# Patient Record
Sex: Female | Born: 1935
Health system: Southern US, Community
[De-identification: ages and names within clinical notes are randomized; demographics above are authoritative.]

## PROBLEM LIST (undated history)

## (undated) DIAGNOSIS — I509 Heart failure, unspecified: Secondary | ICD-10-CM

## (undated) DIAGNOSIS — K449 Diaphragmatic hernia without obstruction or gangrene: Secondary | ICD-10-CM

## (undated) DIAGNOSIS — F329 Major depressive disorder, single episode, unspecified: Secondary | ICD-10-CM

## (undated) DIAGNOSIS — I2510 Atherosclerotic heart disease of native coronary artery without angina pectoris: Secondary | ICD-10-CM

## (undated) DIAGNOSIS — F32A Depression, unspecified: Secondary | ICD-10-CM

## (undated) DIAGNOSIS — K219 Gastro-esophageal reflux disease without esophagitis: Secondary | ICD-10-CM

## (undated) DIAGNOSIS — N809 Endometriosis, unspecified: Secondary | ICD-10-CM

## (undated) DIAGNOSIS — M545 Low back pain, unspecified: Secondary | ICD-10-CM

## (undated) DIAGNOSIS — Z8781 Personal history of (healed) traumatic fracture: Secondary | ICD-10-CM

## (undated) DIAGNOSIS — E785 Hyperlipidemia, unspecified: Secondary | ICD-10-CM

## (undated) DIAGNOSIS — R351 Nocturia: Secondary | ICD-10-CM

## (undated) DIAGNOSIS — Z8679 Personal history of other diseases of the circulatory system: Secondary | ICD-10-CM

## (undated) DIAGNOSIS — K5790 Diverticulosis of intestine, part unspecified, without perforation or abscess without bleeding: Secondary | ICD-10-CM

## (undated) DIAGNOSIS — J90 Pleural effusion, not elsewhere classified: Secondary | ICD-10-CM

## (undated) DIAGNOSIS — I639 Cerebral infarction, unspecified: Secondary | ICD-10-CM

## (undated) DIAGNOSIS — T7840XA Allergy, unspecified, initial encounter: Secondary | ICD-10-CM

## (undated) DIAGNOSIS — N3946 Mixed incontinence: Principal | ICD-10-CM

## (undated) DIAGNOSIS — I739 Peripheral vascular disease, unspecified: Secondary | ICD-10-CM

## (undated) DIAGNOSIS — G4730 Sleep apnea, unspecified: Secondary | ICD-10-CM

## (undated) DIAGNOSIS — B159 Hepatitis A without hepatic coma: Secondary | ICD-10-CM

## (undated) DIAGNOSIS — I10 Essential (primary) hypertension: Secondary | ICD-10-CM

## (undated) HISTORY — DX: Low back pain, unspecified: M54.50

## (undated) HISTORY — DX: Heart failure, unspecified: I50.9

## (undated) HISTORY — DX: Sleep apnea, unspecified: G47.30

## (undated) HISTORY — DX: Hepatitis a without hepatic coma: B15.9

## (undated) HISTORY — DX: Peripheral vascular disease, unspecified: I73.9

## (undated) HISTORY — DX: Pleural effusion, not elsewhere classified: J90

## (undated) HISTORY — DX: Gastro-esophageal reflux disease without esophagitis: K21.9

## (undated) HISTORY — DX: Personal history of (healed) traumatic fracture: Z87.81

## (undated) HISTORY — DX: Diaphragmatic hernia without obstruction or gangrene: K44.9

## (undated) HISTORY — DX: Endometriosis, unspecified: N80.9

## (undated) HISTORY — DX: Depression, unspecified: F32.A

## (undated) HISTORY — DX: Major depressive disorder, single episode, unspecified: F32.9

## (undated) HISTORY — DX: Mixed incontinence: N39.46

## (undated) HISTORY — DX: Atherosclerotic heart disease of native coronary artery without angina pectoris: I25.10

## (undated) HISTORY — DX: Essential (primary) hypertension: I10

## (undated) HISTORY — DX: Hyperlipidemia, unspecified: E78.5

## (undated) HISTORY — DX: Nocturia: R35.1

## (undated) HISTORY — PX: APPENDECTOMY: SHX54

## (undated) HISTORY — DX: Cerebral infarction, unspecified: I63.9

## (undated) HISTORY — PX: CORONARY ARTERY BYPASS GRAFT: SHX141

## (undated) HISTORY — DX: Personal history of other diseases of the circulatory system: Z86.79

## (undated) HISTORY — DX: Low back pain: M54.5

## (undated) HISTORY — DX: Diverticulosis of intestine, part unspecified, without perforation or abscess without bleeding: K57.90

## (undated) HISTORY — PX: CAROTID ENDARTERECTOMY: SUR193

## (undated) HISTORY — PX: KNEE SURGERY: SHX244

## (undated) HISTORY — PX: PARTIAL COLECTOMY: SHX5273

## (undated) HISTORY — DX: Allergy, unspecified, initial encounter: T78.40XA

---

## 1997-09-17 ENCOUNTER — Ambulatory Visit (HOSPITAL_COMMUNITY): Admission: RE | Admit: 1997-09-17 | Discharge: 1997-09-17 | Payer: Self-pay | Admitting: Cardiovascular Disease

## 1998-06-30 ENCOUNTER — Other Ambulatory Visit: Admission: RE | Admit: 1998-06-30 | Discharge: 1998-06-30 | Payer: Self-pay | Admitting: Obstetrics and Gynecology

## 1999-07-03 ENCOUNTER — Other Ambulatory Visit: Admission: RE | Admit: 1999-07-03 | Discharge: 1999-07-03 | Payer: Self-pay | Admitting: Obstetrics and Gynecology

## 2000-02-13 ENCOUNTER — Inpatient Hospital Stay (HOSPITAL_COMMUNITY): Admission: EM | Admit: 2000-02-13 | Discharge: 2000-02-14 | Payer: Self-pay | Admitting: Emergency Medicine

## 2000-02-13 ENCOUNTER — Encounter: Payer: Self-pay | Admitting: Cardiovascular Disease

## 2000-02-14 ENCOUNTER — Encounter: Payer: Self-pay | Admitting: Cardiovascular Disease

## 2000-08-15 ENCOUNTER — Other Ambulatory Visit: Admission: RE | Admit: 2000-08-15 | Discharge: 2000-08-15 | Payer: Self-pay | Admitting: Obstetrics and Gynecology

## 2000-08-30 ENCOUNTER — Emergency Department (HOSPITAL_COMMUNITY): Admission: EM | Admit: 2000-08-30 | Discharge: 2000-08-30 | Payer: Self-pay | Admitting: Emergency Medicine

## 2000-08-30 ENCOUNTER — Encounter: Payer: Self-pay | Admitting: Emergency Medicine

## 2000-11-12 ENCOUNTER — Ambulatory Visit (HOSPITAL_COMMUNITY): Admission: RE | Admit: 2000-11-12 | Discharge: 2000-11-12 | Payer: Self-pay | Admitting: Cardiovascular Disease

## 2001-05-21 ENCOUNTER — Emergency Department (HOSPITAL_COMMUNITY): Admission: EM | Admit: 2001-05-21 | Discharge: 2001-05-22 | Payer: Self-pay | Admitting: Emergency Medicine

## 2001-10-14 ENCOUNTER — Other Ambulatory Visit: Admission: RE | Admit: 2001-10-14 | Discharge: 2001-10-14 | Payer: Self-pay | Admitting: Obstetrics and Gynecology

## 2002-03-22 ENCOUNTER — Emergency Department (HOSPITAL_COMMUNITY): Admission: EM | Admit: 2002-03-22 | Discharge: 2002-03-22 | Payer: Self-pay

## 2002-05-19 ENCOUNTER — Encounter: Admission: RE | Admit: 2002-05-19 | Discharge: 2002-07-08 | Payer: Self-pay | Admitting: Neurology

## 2002-10-24 ENCOUNTER — Encounter: Payer: Self-pay | Admitting: Emergency Medicine

## 2002-10-24 ENCOUNTER — Emergency Department (HOSPITAL_COMMUNITY): Admission: EM | Admit: 2002-10-24 | Discharge: 2002-10-24 | Payer: Self-pay | Admitting: Emergency Medicine

## 2003-01-01 ENCOUNTER — Encounter: Admission: RE | Admit: 2003-01-01 | Discharge: 2003-01-01 | Payer: Self-pay | Admitting: Family Medicine

## 2003-01-01 ENCOUNTER — Encounter: Payer: Self-pay | Admitting: Family Medicine

## 2003-02-21 LAB — HM COLONOSCOPY

## 2003-02-24 ENCOUNTER — Encounter: Payer: Self-pay | Admitting: Gastroenterology

## 2003-05-27 ENCOUNTER — Encounter: Admission: RE | Admit: 2003-05-27 | Discharge: 2003-05-27 | Payer: Self-pay | Admitting: Orthopedic Surgery

## 2003-07-21 ENCOUNTER — Encounter: Admission: RE | Admit: 2003-07-21 | Discharge: 2003-08-05 | Payer: Self-pay | Admitting: Family Medicine

## 2004-02-16 ENCOUNTER — Encounter: Admission: RE | Admit: 2004-02-16 | Discharge: 2004-02-16 | Payer: Self-pay | Admitting: Interventional Radiology

## 2004-02-16 ENCOUNTER — Encounter: Admission: RE | Admit: 2004-02-16 | Discharge: 2004-02-16 | Payer: Self-pay | Admitting: Family Medicine

## 2004-02-24 ENCOUNTER — Encounter (INDEPENDENT_AMBULATORY_CARE_PROVIDER_SITE_OTHER): Payer: Self-pay | Admitting: Specialist

## 2004-02-24 ENCOUNTER — Ambulatory Visit (HOSPITAL_COMMUNITY): Admission: RE | Admit: 2004-02-24 | Discharge: 2004-02-25 | Payer: Self-pay | Admitting: Interventional Radiology

## 2004-06-08 ENCOUNTER — Encounter: Admission: RE | Admit: 2004-06-08 | Discharge: 2004-06-08 | Payer: Self-pay | Admitting: Family Medicine

## 2004-06-20 ENCOUNTER — Ambulatory Visit (HOSPITAL_COMMUNITY): Admission: RE | Admit: 2004-06-20 | Discharge: 2004-06-20 | Payer: Self-pay | Admitting: Cardiovascular Disease

## 2005-04-12 ENCOUNTER — Observation Stay (HOSPITAL_COMMUNITY): Admission: EM | Admit: 2005-04-12 | Discharge: 2005-04-13 | Payer: Self-pay | Admitting: Emergency Medicine

## 2005-06-14 ENCOUNTER — Inpatient Hospital Stay (HOSPITAL_COMMUNITY): Admission: EM | Admit: 2005-06-14 | Discharge: 2005-06-15 | Payer: Self-pay | Admitting: *Deleted

## 2005-06-19 ENCOUNTER — Ambulatory Visit: Payer: Self-pay | Admitting: Gastroenterology

## 2005-06-25 ENCOUNTER — Ambulatory Visit: Payer: Self-pay | Admitting: Gastroenterology

## 2005-07-02 ENCOUNTER — Ambulatory Visit: Payer: Self-pay | Admitting: Gastroenterology

## 2005-07-09 ENCOUNTER — Ambulatory Visit (HOSPITAL_COMMUNITY): Admission: RE | Admit: 2005-07-09 | Discharge: 2005-07-09 | Payer: Self-pay | Admitting: Gastroenterology

## 2005-07-24 ENCOUNTER — Ambulatory Visit: Payer: Self-pay | Admitting: Gastroenterology

## 2005-11-14 ENCOUNTER — Encounter (INDEPENDENT_AMBULATORY_CARE_PROVIDER_SITE_OTHER): Payer: Self-pay | Admitting: *Deleted

## 2005-11-14 ENCOUNTER — Ambulatory Visit (HOSPITAL_COMMUNITY): Admission: RE | Admit: 2005-11-14 | Discharge: 2005-11-14 | Payer: Self-pay | Admitting: Orthopedic Surgery

## 2005-12-15 ENCOUNTER — Inpatient Hospital Stay (HOSPITAL_COMMUNITY): Admission: EM | Admit: 2005-12-15 | Discharge: 2005-12-17 | Payer: Self-pay | Admitting: Emergency Medicine

## 2007-05-23 LAB — HM DEXA SCAN

## 2007-09-18 DIAGNOSIS — I10 Essential (primary) hypertension: Secondary | ICD-10-CM | POA: Insufficient documentation

## 2007-09-18 DIAGNOSIS — R0789 Other chest pain: Secondary | ICD-10-CM | POA: Insufficient documentation

## 2007-09-18 DIAGNOSIS — I2510 Atherosclerotic heart disease of native coronary artery without angina pectoris: Secondary | ICD-10-CM | POA: Insufficient documentation

## 2007-09-19 ENCOUNTER — Ambulatory Visit: Payer: Self-pay | Admitting: Internal Medicine

## 2007-09-25 ENCOUNTER — Encounter: Payer: Self-pay | Admitting: Internal Medicine

## 2007-09-25 ENCOUNTER — Ambulatory Visit (HOSPITAL_BASED_OUTPATIENT_CLINIC_OR_DEPARTMENT_OTHER): Admission: RE | Admit: 2007-09-25 | Discharge: 2007-09-25 | Payer: Self-pay | Admitting: Internal Medicine

## 2007-09-27 ENCOUNTER — Ambulatory Visit: Payer: Self-pay | Admitting: Internal Medicine

## 2007-10-13 ENCOUNTER — Ambulatory Visit: Payer: Self-pay | Admitting: Internal Medicine

## 2007-10-23 DIAGNOSIS — G4730 Sleep apnea, unspecified: Secondary | ICD-10-CM | POA: Insufficient documentation

## 2008-05-15 ENCOUNTER — Ambulatory Visit: Payer: Self-pay | Admitting: *Deleted

## 2008-05-15 ENCOUNTER — Inpatient Hospital Stay (HOSPITAL_COMMUNITY): Admission: EM | Admit: 2008-05-15 | Discharge: 2008-05-17 | Payer: Self-pay | Admitting: Emergency Medicine

## 2008-05-28 DIAGNOSIS — I639 Cerebral infarction, unspecified: Secondary | ICD-10-CM

## 2008-05-28 HISTORY — DX: Cerebral infarction, unspecified: I63.9

## 2008-06-14 ENCOUNTER — Inpatient Hospital Stay (HOSPITAL_BASED_OUTPATIENT_CLINIC_OR_DEPARTMENT_OTHER): Admission: RE | Admit: 2008-06-14 | Discharge: 2008-06-14 | Payer: Self-pay | Admitting: Cardiovascular Disease

## 2008-06-18 ENCOUNTER — Ambulatory Visit: Payer: Self-pay | Admitting: Surgery

## 2008-07-02 ENCOUNTER — Inpatient Hospital Stay (HOSPITAL_COMMUNITY): Admission: EM | Admit: 2008-07-02 | Discharge: 2008-07-14 | Payer: Self-pay | Admitting: Emergency Medicine

## 2008-07-02 ENCOUNTER — Encounter: Payer: Self-pay | Admitting: Cardiovascular Disease

## 2008-07-02 ENCOUNTER — Ambulatory Visit: Payer: Self-pay | Admitting: *Deleted

## 2008-07-05 ENCOUNTER — Ambulatory Visit: Payer: Self-pay | Admitting: Thoracic Surgery (Cardiothoracic Vascular Surgery)

## 2008-07-14 ENCOUNTER — Ambulatory Visit: Payer: Self-pay | Admitting: Internal Medicine

## 2008-07-15 ENCOUNTER — Inpatient Hospital Stay (HOSPITAL_COMMUNITY): Admission: EM | Admit: 2008-07-15 | Discharge: 2008-07-20 | Payer: Self-pay | Admitting: Internal Medicine

## 2008-07-16 ENCOUNTER — Encounter: Payer: Self-pay | Admitting: Cardiothoracic Surgery

## 2008-07-26 ENCOUNTER — Ambulatory Visit: Payer: Self-pay | Admitting: Thoracic Surgery (Cardiothoracic Vascular Surgery)

## 2008-07-26 ENCOUNTER — Encounter
Admission: RE | Admit: 2008-07-26 | Discharge: 2008-07-26 | Payer: Self-pay | Admitting: Thoracic Surgery (Cardiothoracic Vascular Surgery)

## 2008-07-28 ENCOUNTER — Emergency Department (HOSPITAL_COMMUNITY): Admission: EM | Admit: 2008-07-28 | Discharge: 2008-07-28 | Payer: Self-pay | Admitting: Emergency Medicine

## 2008-08-02 ENCOUNTER — Encounter
Admission: RE | Admit: 2008-08-02 | Discharge: 2008-08-02 | Payer: Self-pay | Admitting: Thoracic Surgery (Cardiothoracic Vascular Surgery)

## 2008-08-02 ENCOUNTER — Ambulatory Visit: Payer: Self-pay | Admitting: Thoracic Surgery (Cardiothoracic Vascular Surgery)

## 2008-08-26 HISTORY — PX: CARDIAC CATHETERIZATION: SHX172

## 2008-09-08 ENCOUNTER — Inpatient Hospital Stay (HOSPITAL_BASED_OUTPATIENT_CLINIC_OR_DEPARTMENT_OTHER): Admission: RE | Admit: 2008-09-08 | Discharge: 2008-09-08 | Payer: Self-pay | Admitting: Cardiovascular Disease

## 2008-09-14 ENCOUNTER — Encounter: Admission: RE | Admit: 2008-09-14 | Discharge: 2008-09-14 | Payer: Self-pay | Admitting: *Deleted

## 2008-09-14 ENCOUNTER — Ambulatory Visit: Payer: Self-pay | Admitting: Thoracic Surgery (Cardiothoracic Vascular Surgery)

## 2008-09-20 ENCOUNTER — Encounter (HOSPITAL_COMMUNITY): Admission: RE | Admit: 2008-09-20 | Discharge: 2008-10-20 | Payer: Self-pay | Admitting: Cardiovascular Disease

## 2008-10-20 ENCOUNTER — Encounter (HOSPITAL_COMMUNITY): Admission: RE | Admit: 2008-10-20 | Discharge: 2008-11-19 | Payer: Self-pay | Admitting: Cardiovascular Disease

## 2008-11-20 ENCOUNTER — Emergency Department (HOSPITAL_COMMUNITY): Admission: EM | Admit: 2008-11-20 | Discharge: 2008-11-20 | Payer: Self-pay | Admitting: Emergency Medicine

## 2008-11-22 ENCOUNTER — Encounter (HOSPITAL_COMMUNITY): Admission: RE | Admit: 2008-11-22 | Discharge: 2008-11-26 | Payer: Self-pay | Admitting: Cardiovascular Disease

## 2008-12-01 ENCOUNTER — Encounter (HOSPITAL_COMMUNITY): Admission: RE | Admit: 2008-12-01 | Discharge: 2008-12-31 | Payer: Self-pay | Admitting: Cardiovascular Disease

## 2009-01-06 ENCOUNTER — Encounter: Admission: RE | Admit: 2009-01-06 | Discharge: 2009-01-06 | Payer: Self-pay | Admitting: Family Medicine

## 2009-05-16 ENCOUNTER — Encounter: Admission: RE | Admit: 2009-05-16 | Discharge: 2009-05-16 | Payer: Self-pay | Admitting: Family Medicine

## 2009-05-24 ENCOUNTER — Emergency Department (HOSPITAL_COMMUNITY): Admission: EM | Admit: 2009-05-24 | Discharge: 2009-05-24 | Payer: Self-pay | Admitting: Emergency Medicine

## 2009-07-11 ENCOUNTER — Telehealth: Payer: Self-pay | Admitting: Internal Medicine

## 2009-07-12 ENCOUNTER — Ambulatory Visit: Payer: Self-pay | Admitting: Gastroenterology

## 2009-07-12 DIAGNOSIS — B199 Unspecified viral hepatitis without hepatic coma: Secondary | ICD-10-CM | POA: Insufficient documentation

## 2009-07-12 DIAGNOSIS — I219 Acute myocardial infarction, unspecified: Secondary | ICD-10-CM | POA: Insufficient documentation

## 2009-07-12 DIAGNOSIS — K5730 Diverticulosis of large intestine without perforation or abscess without bleeding: Secondary | ICD-10-CM | POA: Insufficient documentation

## 2009-07-12 DIAGNOSIS — R079 Chest pain, unspecified: Secondary | ICD-10-CM | POA: Insufficient documentation

## 2009-07-12 DIAGNOSIS — K219 Gastro-esophageal reflux disease without esophagitis: Secondary | ICD-10-CM | POA: Insufficient documentation

## 2009-07-12 LAB — CONVERTED CEMR LAB
ALT: 32 units/L (ref 0–35)
AST: 28 units/L (ref 0–37)
Albumin: 4 g/dL (ref 3.5–5.2)
Alkaline Phosphatase: 70 units/L (ref 39–117)
BUN: 15 mg/dL (ref 6–23)
Basophils Absolute: 0 10*3/uL (ref 0.0–0.1)
Basophils Relative: 0.7 % (ref 0.0–3.0)
Bilirubin, Direct: 0.1 mg/dL (ref 0.0–0.3)
CO2: 30 meq/L (ref 19–32)
Calcium: 9.3 mg/dL (ref 8.4–10.5)
Chloride: 105 meq/L (ref 96–112)
Creatinine, Ser: 0.8 mg/dL (ref 0.4–1.2)
Eosinophils Absolute: 0.1 10*3/uL (ref 0.0–0.7)
Eosinophils Relative: 1.3 % (ref 0.0–5.0)
Ferritin: 30.6 ng/mL (ref 10.0–291.0)
Folate: 17.2 ng/mL
GFR calc non Af Amer: 73.64 mL/min (ref >60)
Glucose, Bld: 103 mg/dL — ABNORMAL HIGH (ref 70–99)
HCT: 37.9 % (ref 36.0–46.0)
Hemoglobin: 12.6 g/dL (ref 12.0–15.0)
Iron: 58 ug/dL (ref 42–145)
Lymphocytes Relative: 35.2 % (ref 12.0–46.0)
Lymphs Abs: 2.5 10*3/uL (ref 0.7–4.0)
MCHC: 33.2 g/dL (ref 30.0–36.0)
MCV: 94.2 fL (ref 78.0–100.0)
Monocytes Absolute: 0.5 10*3/uL (ref 0.1–1.0)
Monocytes Relative: 7.6 % (ref 3.0–12.0)
Neutro Abs: 4 10*3/uL (ref 1.4–7.7)
Neutrophils Relative %: 55.2 % (ref 43.0–77.0)
Platelets: 218 10*3/uL (ref 150.0–400.0)
Potassium: 4.3 meq/L (ref 3.5–5.1)
RBC: 4.03 M/uL (ref 3.87–5.11)
RDW: 13.1 % (ref 11.5–14.6)
Saturation Ratios: 14.9 % — ABNORMAL LOW (ref 20.0–50.0)
Sodium: 141 meq/L (ref 135–145)
TSH: 1.61 microintl units/mL (ref 0.35–5.50)
Total Bilirubin: 0.5 mg/dL (ref 0.3–1.2)
Total Protein: 6.4 g/dL (ref 6.0–8.3)
Transferrin: 278.2 mg/dL (ref 212.0–360.0)
Vitamin B-12: 468 pg/mL (ref 211–911)
WBC: 7.1 10*3/uL (ref 4.5–10.5)

## 2009-07-20 ENCOUNTER — Ambulatory Visit (HOSPITAL_COMMUNITY): Admission: RE | Admit: 2009-07-20 | Discharge: 2009-07-20 | Payer: Self-pay | Admitting: Gastroenterology

## 2009-12-29 ENCOUNTER — Encounter (INDEPENDENT_AMBULATORY_CARE_PROVIDER_SITE_OTHER): Payer: Self-pay | Admitting: *Deleted

## 2010-04-26 IMAGING — CR DG CHEST 1V PORT
1 series · 1 of 1 positions shown · non-contrast
Comparison: PA and lateral chest 07/03/2008.

CLINICAL DATA: Status post CABG.

PORTABLE CHEST - 1 VIEW

[view not recorded]
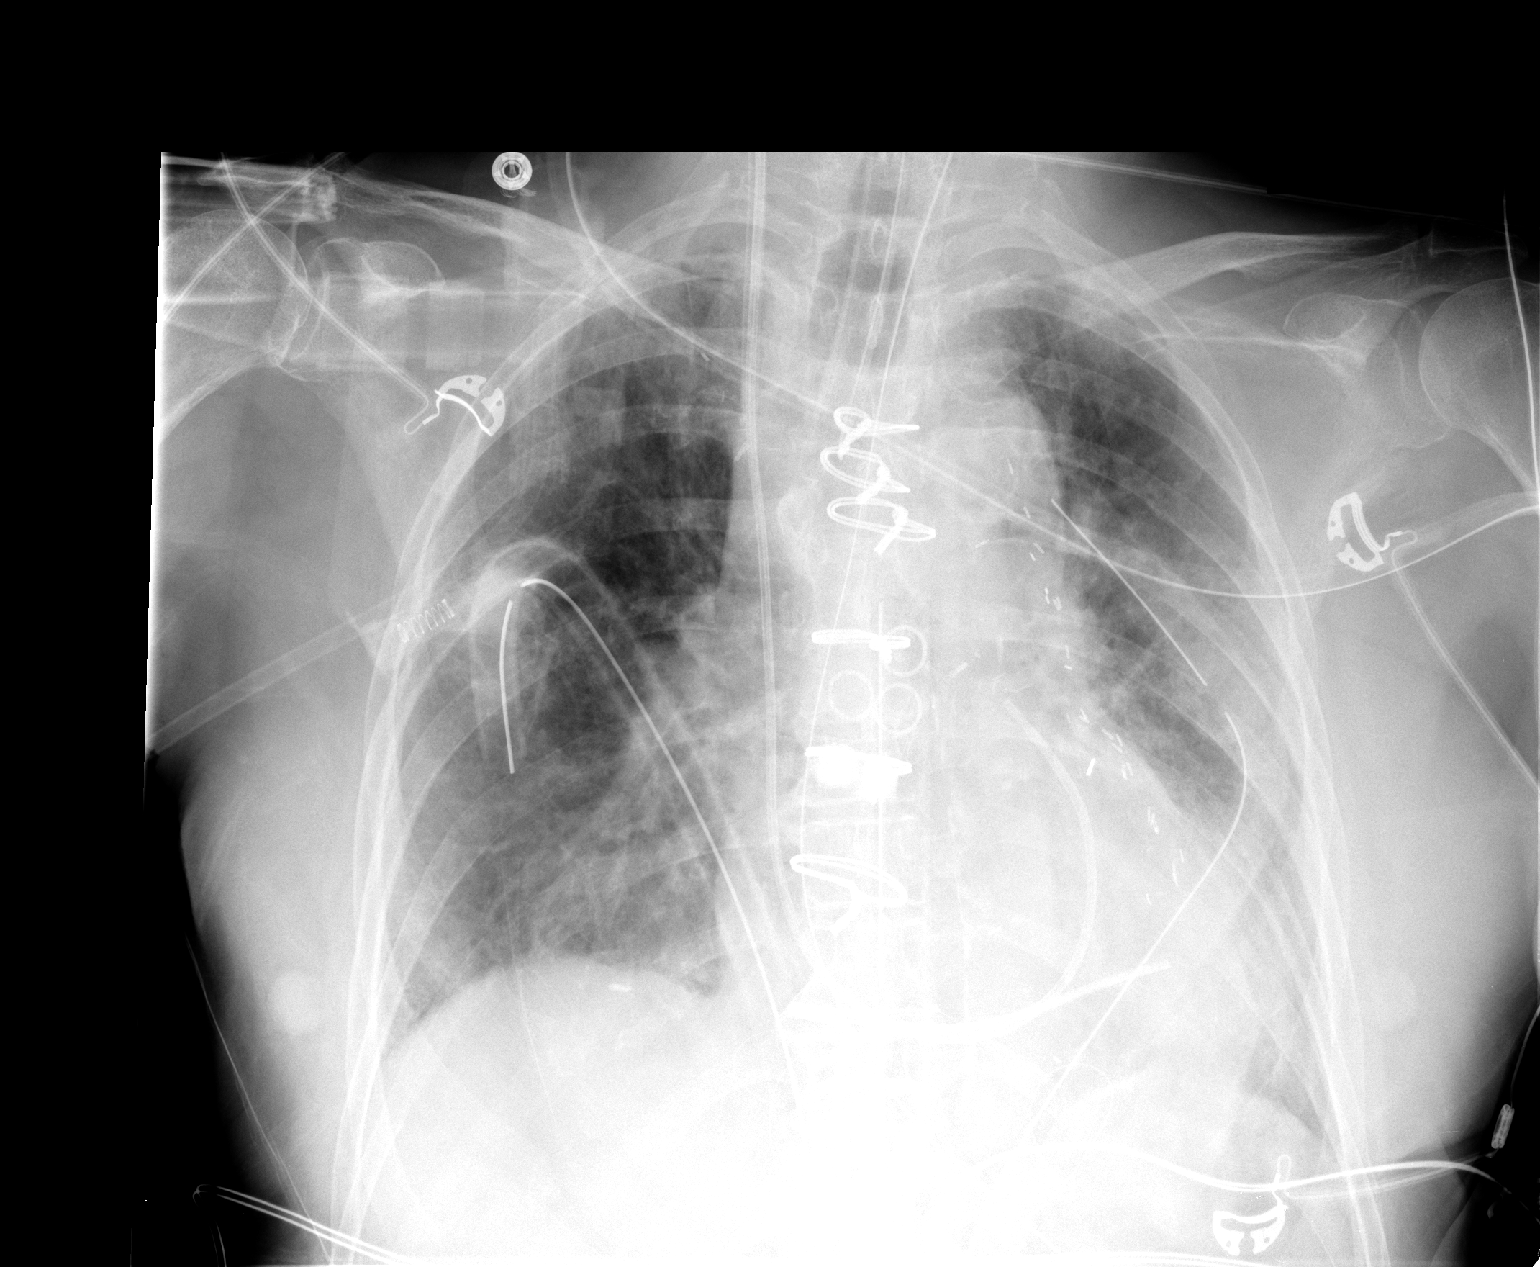

[1 of 1 positions shown; findings below may reference images not displayed]

FINDINGS: The patient has an endotracheal tube in place in good
position with the tip just below the clavicular heads.  NG tube
courses into the stomach.  Right IJ approach Swan-Ganz catheter has
its tip in the distal pulmonary outflow tract.  Bilateral chest
tubes and mediastinal drain are in place.  There is no
pneumothorax.

The patient is status post CABG.  There is scattered bilateral
atelectasis with lung volumes lower than on the comparison study.
Heart size is normal.  No effusion is identified.
IMPRESSION: 1.  Support tubes lines are in good position.  There is no
pneumothorax.
2.  Low lung volumes with scattered bilateral atelectasis.

REF:[id number] DICTATED: 07/06/2008 [DATE]

## 2010-04-28 IMAGING — CR DG CHEST 1V PORT
1 series · 1 of 1 positions shown · non-contrast
Comparison: 07/07/2008.

CLINICAL DATA: Chest pain.  History of coronary artery disease.

PORTABLE CHEST - 1 VIEW

[view not recorded]
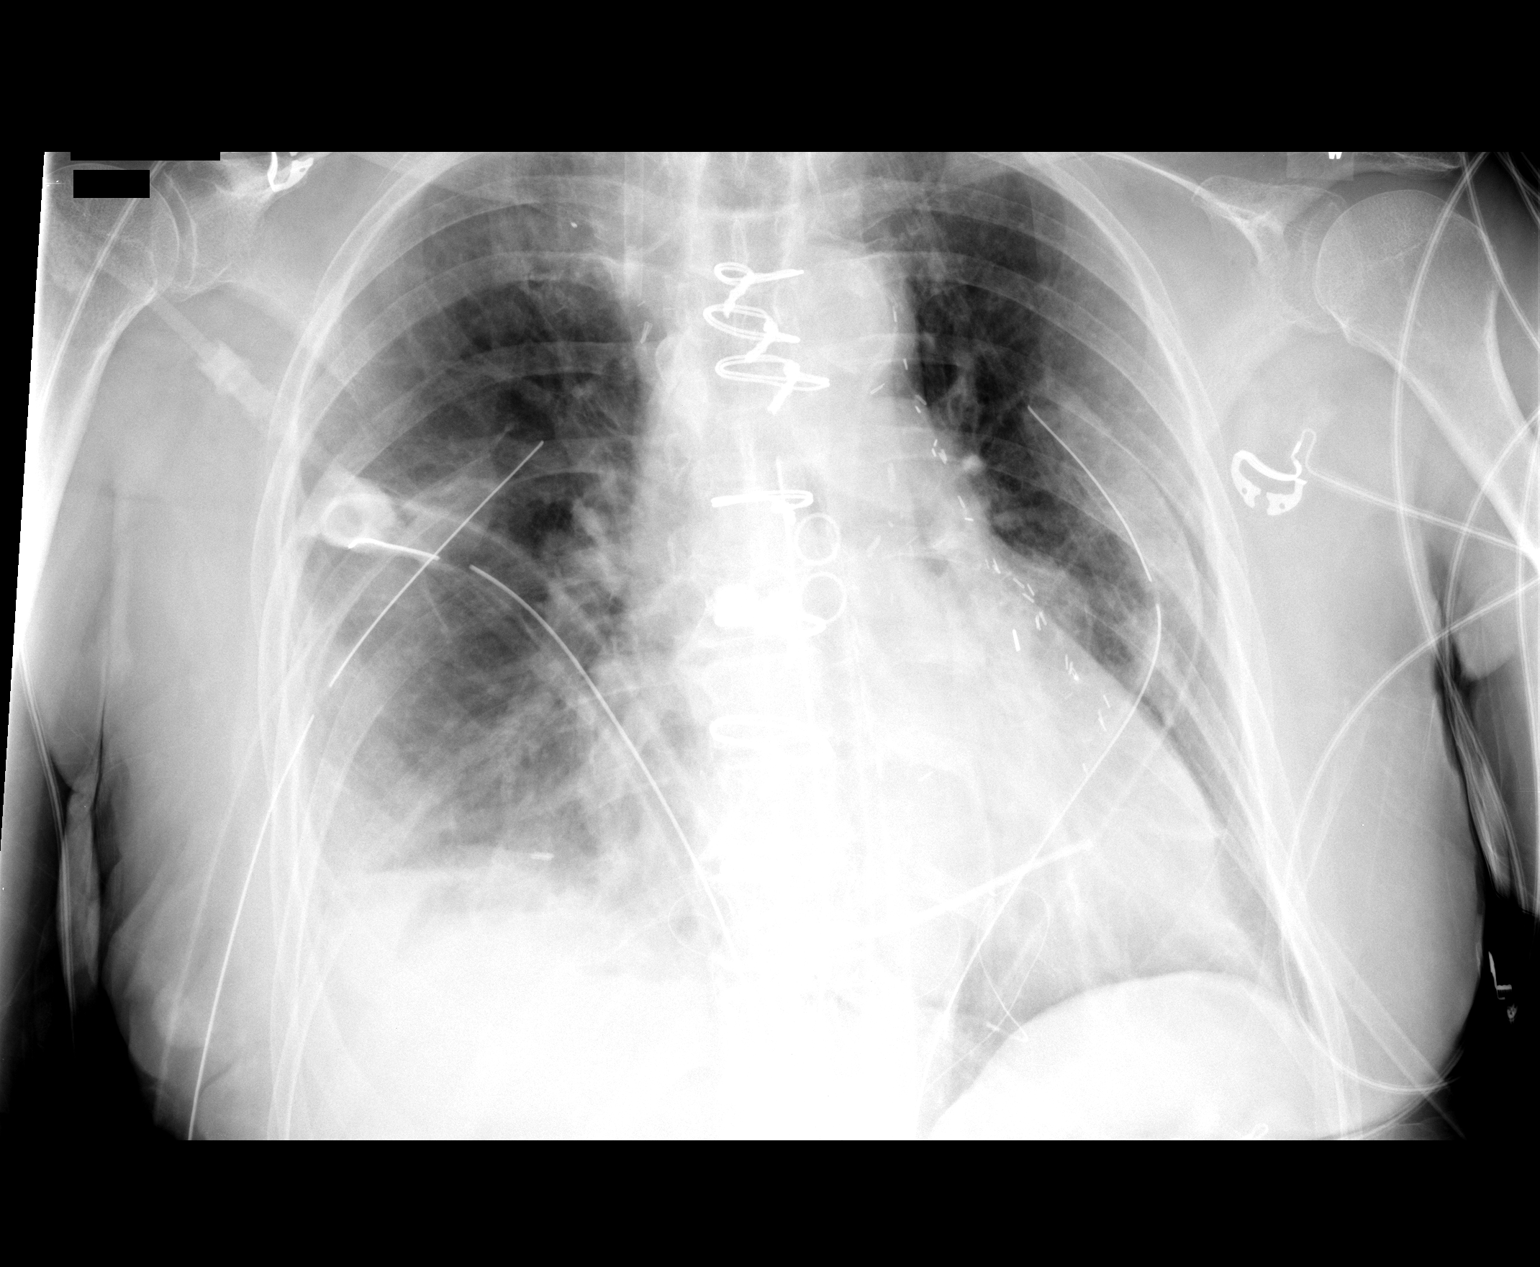

[1 of 1 positions shown; findings below may reference images not displayed]

FINDINGS: Cardiomegaly.  CABG/median sternotomy.  Mediastinal drain
and bilateral chest tubes are unchanged.  Aeration is essentially
unchanged as well. Right IJ vascular sheath persists.
IMPRESSION: No interval change.

## 2010-04-28 IMAGING — CT CT HEAD W/O CM
1 series · 16 of 30 positions shown, 20 images · non-contrast
Comparison: None

CLINICAL DATA: Chest pain.  History of CAD.  Code stroke.  Right
arm flaccid.  History of right carotid endarterectomy.

CT HEAD WITHOUT CONTRAST
TECHNIQUE: Contiguous axial images were obtained from the base of
the skull through the vertex without contrast.

[Series 2: head routine 4.8 h37s · axial · 0.43mm/px · z∈[-104,+33]mm · 16 of 30 slices shown, 20 images]
[im 2/30  brain]
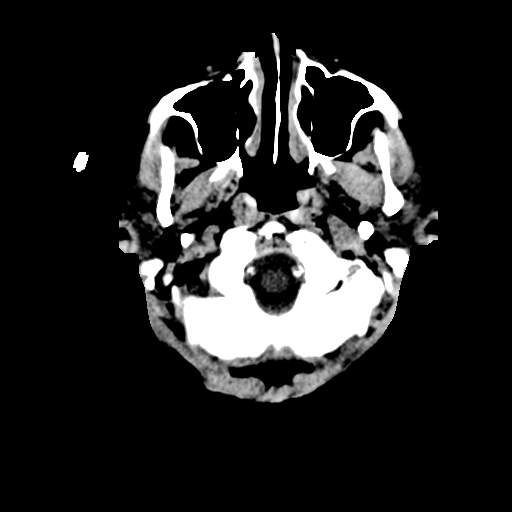
[im 2/30  bone]
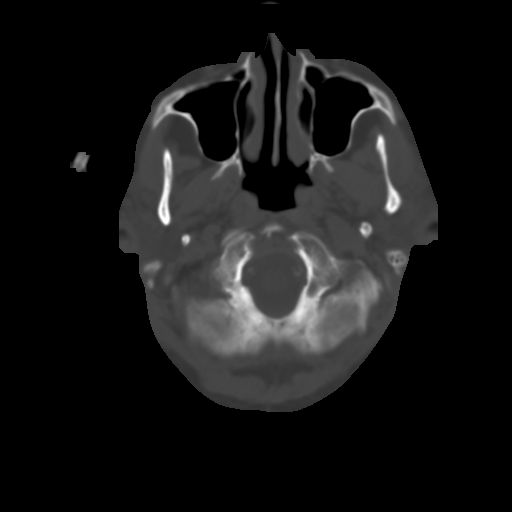
[im 4/30  brain]
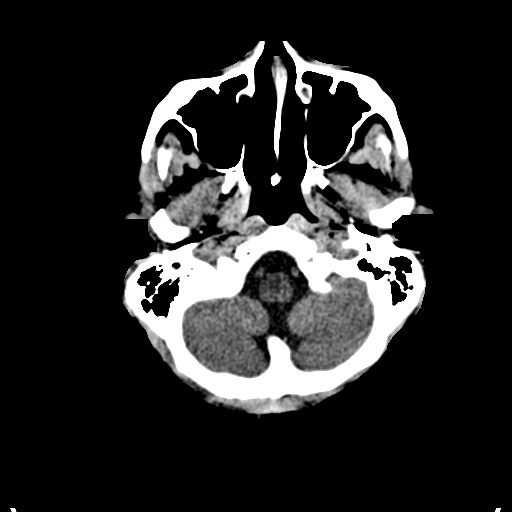
[im 6/30  brain]
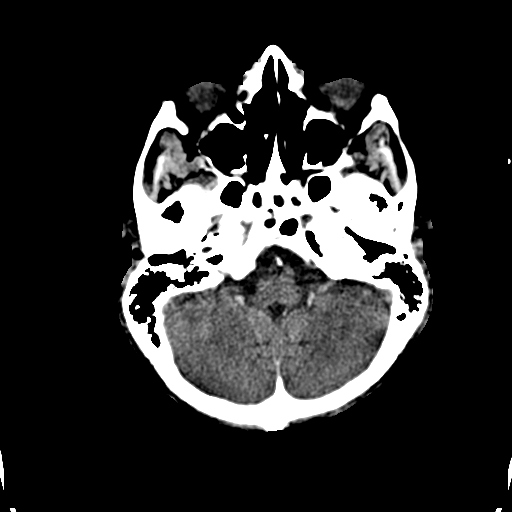
[im 8/30  brain]
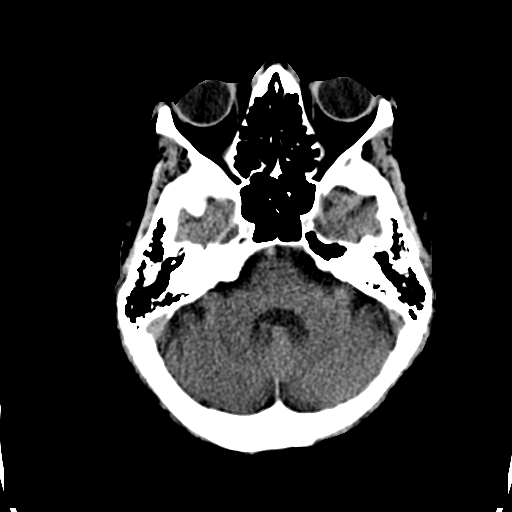
[im 9/30  brain]
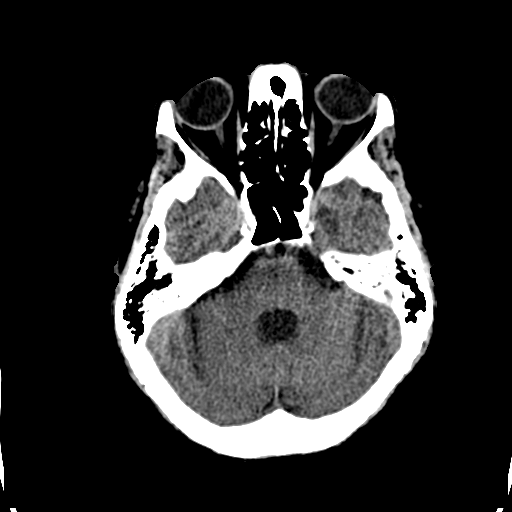
[im 9/30  bone]
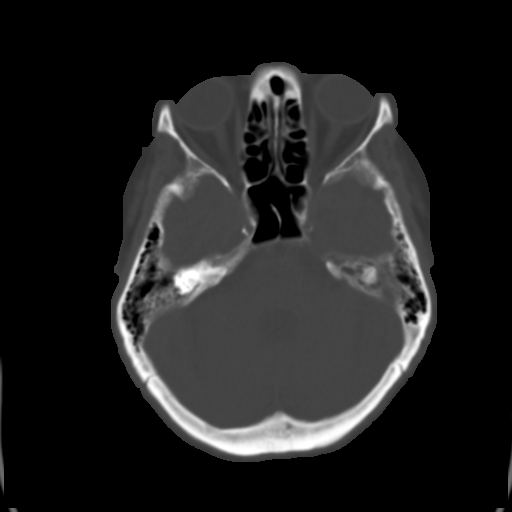
[im 11/30  brain]
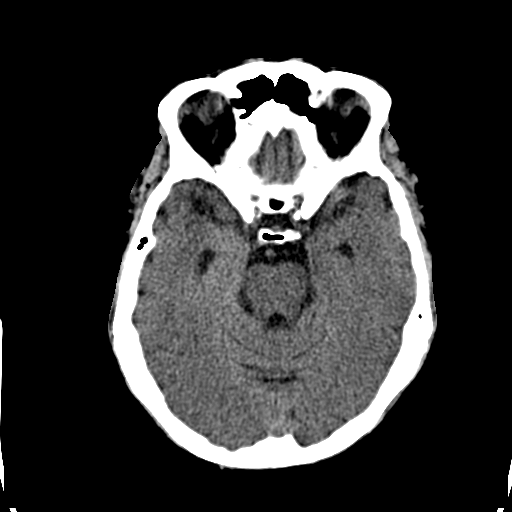
[im 13/30  brain]
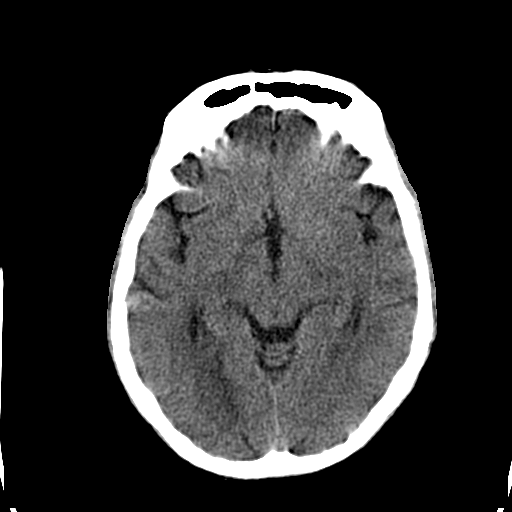
[im 15/30  brain]
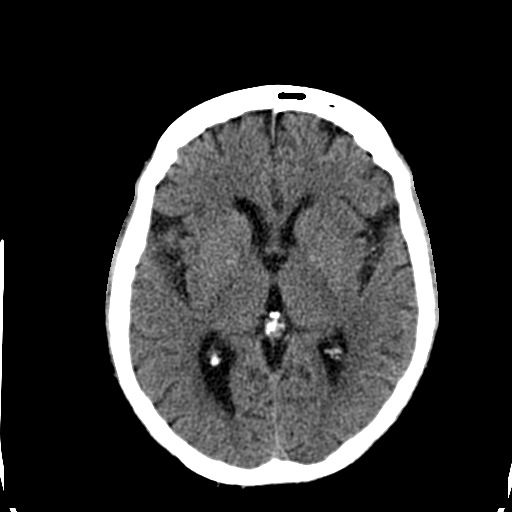
[im 16/30  brain]
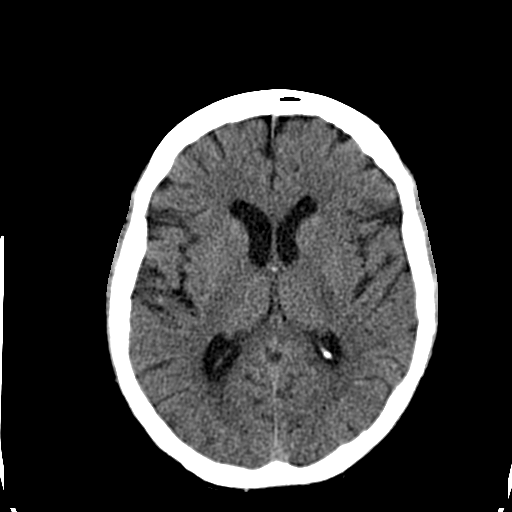
[im 16/30  bone]
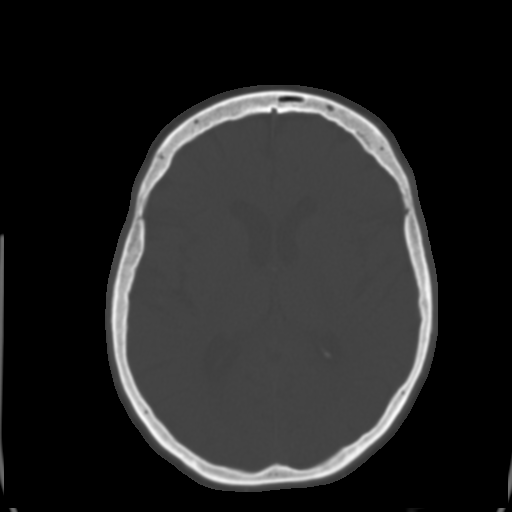
[im 18/30  brain]
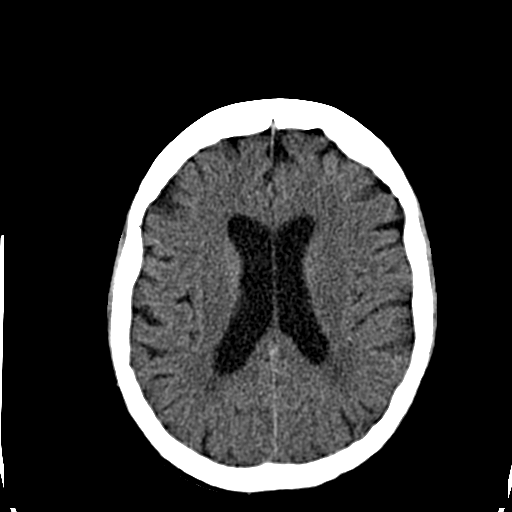
[im 20/30  brain]
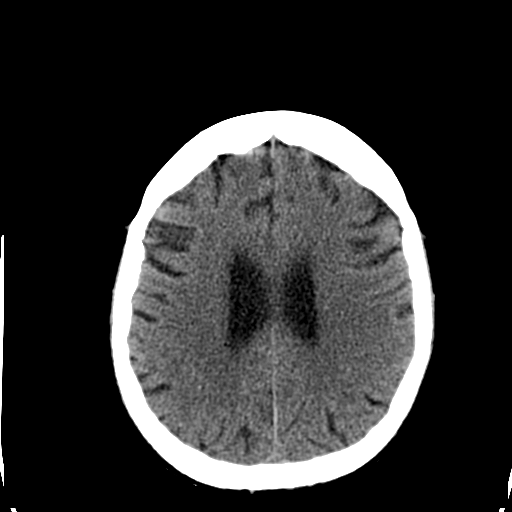
[im 22/30  brain]
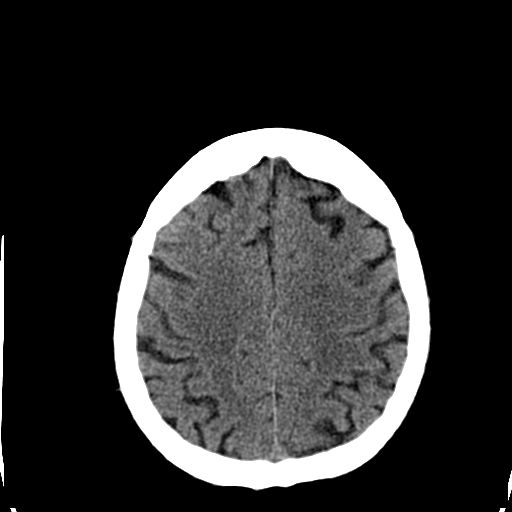
[im 23/30  brain]
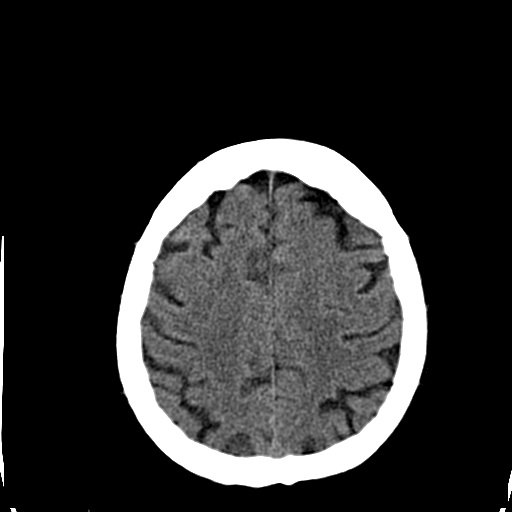
[im 23/30  bone]
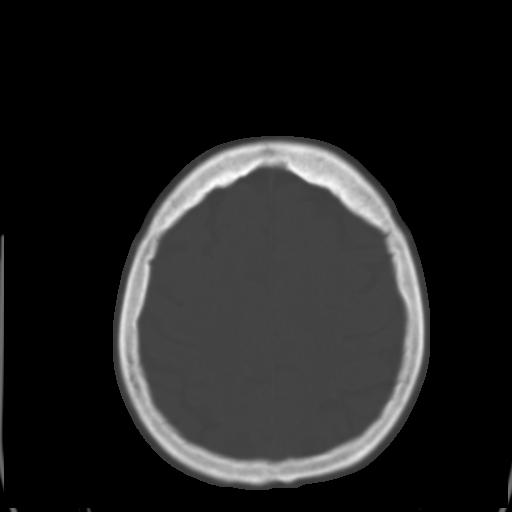
[im 25/30  brain]
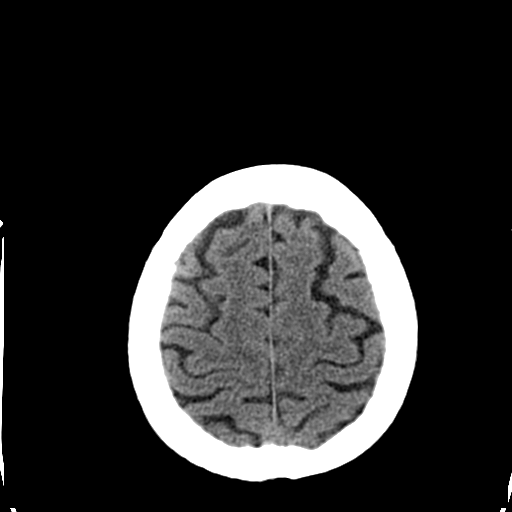
[im 27/30  brain]
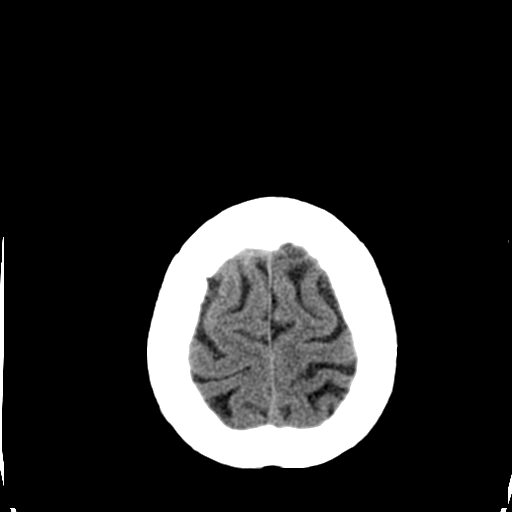
[im 29/30  brain]
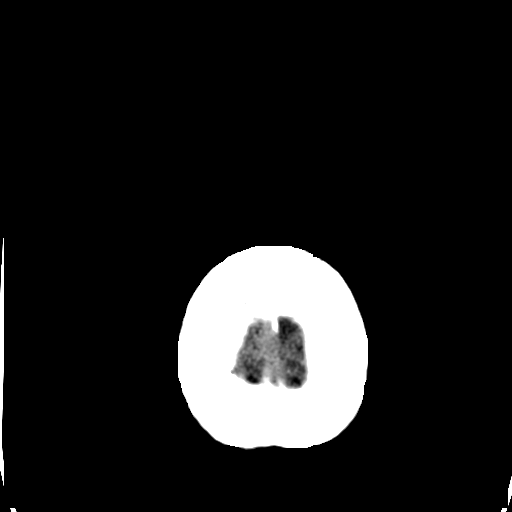

[16 of 30 positions shown; findings below may reference images not displayed]

FINDINGS: There is central and cortical atrophy.  Basilar cisterns
and ventricles have a normal appearance accounting for atrophy.
There is no intra or extra-axial fluid collection or mass.  No CT
evidence for acute hemorrhage or infarction.

Bone windows are unremarkable.
IMPRESSION: 1.  Atrophy and changes of small vessel disease.
2.  No CT evidence for acute intracranial hemorrhage or infarction.

Critical test results telephoned to Dr. Admis at the time of
interpretation on 07/08/2008 at [DATE] a.m.

## 2010-04-28 IMAGING — CT CT ANGIO NECK
2 of 10 series · 6 of 33 positions shown · IV contrast (APPLIED)
Comparison: Head CT 07/08/2008.

CTA HEAD

CLINICAL DATA: Chest pain.  Stroke symptoms.  Right-sided
weakness.  History of endarterectomy.

CT ANGIOGRAPHY HEAD AND NECK
TECHNIQUE: Multidetector CT imaging of the head and neck was
performed using the standard protocol prior to and during bolus
administration of intravenous contrast. Multiplanar CT image
reconstructions including MIPs were obtained to evaluate the
vascular anatomy.  Carotid stenosis measurements (when applicable)
are obtained utilizing NASCET criteria, using the distal internal
carotid diameter as the denominator.
Contrast: 95 ml Omnipaque 350

[Series 7: angio 2mm · axial · 0.41mm/px · z∈[-668,-544]mm · 2 of 187 slices shown]
[im 63/187  soft-tissue]
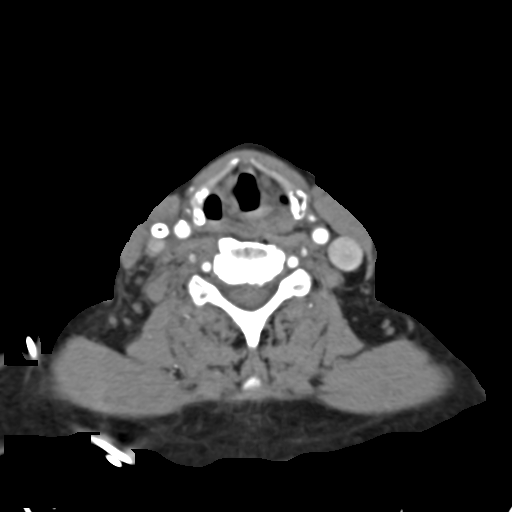
[im 125/187  bone]
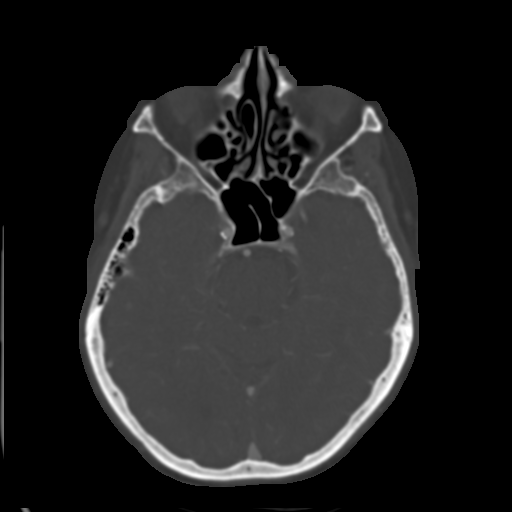

[Series 608: 1 x1 mips axials · axial · 0.73mm/px · z∈[-712,-517]mm · 4 of 326 slices shown]
[im 66/326  soft-tissue]
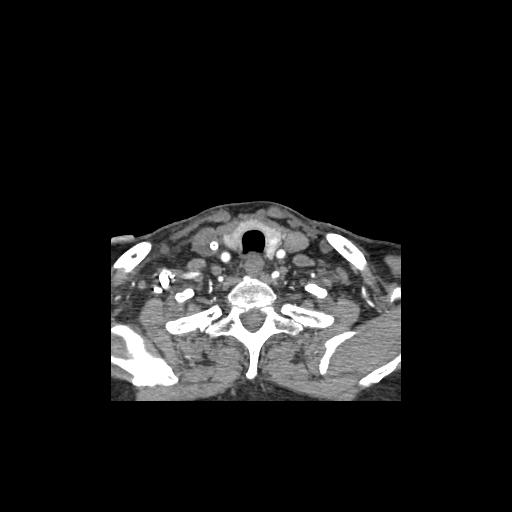
[im 131/326  soft-tissue]
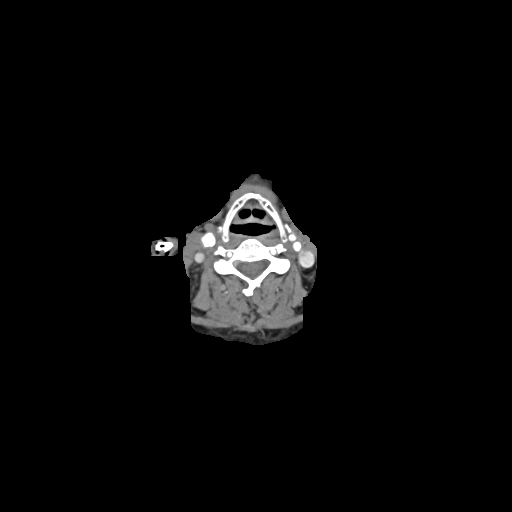
[im 196/326  soft-tissue]
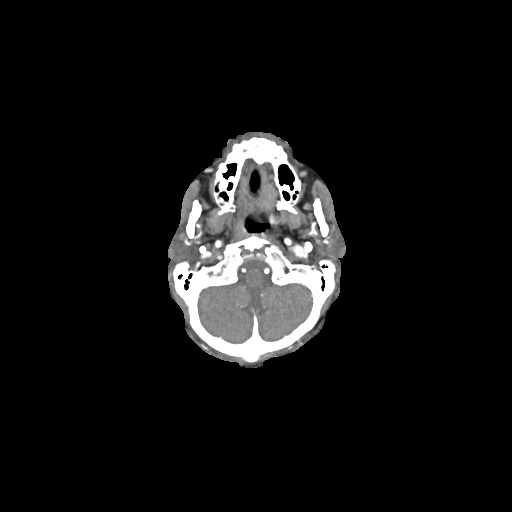
[im 261/326  soft-tissue]
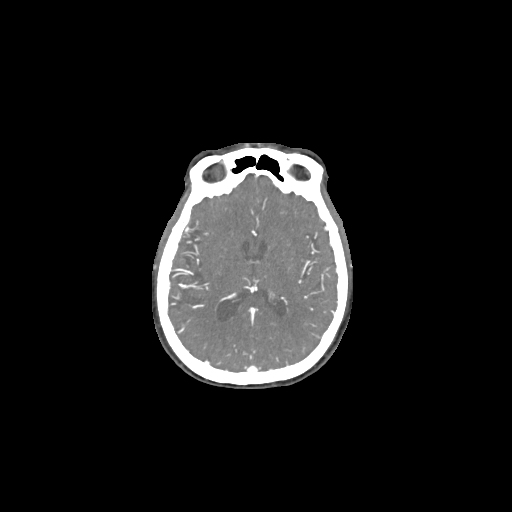

[6 of 33 positions shown; findings below may reference images not displayed]

FINDINGS: Pre and postcontrast imaging of the brain demonstrates
no evidence for acute infarct.  Minimal white matter disease is
stable.  No areas of pathologic enhancement are evident.  The
paranasal sinuses and mastoid air cells are clear.  The osseous
skull is intact.

CTA images demonstrate dense calcifications in the cavernous pre
cavernous segments of the internal carotid artery bilaterally.
There is no focal stenosis.  The A1 and M1 segments are within
normal limits bilaterally.  ACA and MCA branches are unremarkable.
The anterior communicating artery is not clearly seen.  The left
vertebral artery is the dominant vessel.  Vascular calcifications
are noted at the dural margin without significant stenosis.  The
basilar artery is within normal limits.  Both posterior cerebral
arteries originate from the basilar tip.  The dural sinuses are
patent.
IMPRESSION: 1.  Atherosclerotic calcifications at the dural margins of the
vertebral arteries and within the cavernous left pre cavernous
internal carotid arteries without significant stenosis.
2.  No significant proximal stenosis, aneurysm, or branch vessel
occlusion.

CTA NECK
FINDINGS: Bilateral pleural effusions are present.  There are
bilateral chest tubes small pneumothoraces.  A 17 mm nodule is
present in the left lobe of the thyroid.  A standard three-vessel
arch configuration is present.  Vascular calcifications are noted
at the origins of the great vessels without significant stenosis.
Both vertebral arteries originate from the subclavians.  There are
vascular calcifications at both vertebral artery origins without
significant stenosis.  As stated, the left vertebral artery is the
dominant vessel.  The vertebral arteries are within normal limits
throughout the neck.

The left carotid bifurcation is unremarkable.  There are minimal
calcifications along the lateral aspect of the proximal ICA without
significant stenosis.  More prominent posterior calcifications are
seen in the proximal right internal carotid artery just beyond the
bifurcation.  There is a linear filling defect in the distal right
common carotid artery at the level of C6 which may represent a
chronic dissection.  No other focal lesions are evident.
IMPRESSION: 1.  Atherosclerotic calcifications of the carotid bifurcations
bilaterally, right greater than left without significant stenosis.
2.  Linear defect in the distal right common carotid artery
suggesting chronic dissection.
3.  Vascular calcifications of the origins of the arch vessels in
the proximal vertebral arteries without significant stenosis.
4.  17 mm left thyroid lesion.  This is incompletely characterized.

## 2010-05-10 ENCOUNTER — Ambulatory Visit: Payer: Self-pay | Admitting: Cardiovascular Disease

## 2010-05-17 ENCOUNTER — Ambulatory Visit: Payer: Self-pay | Admitting: Cardiovascular Disease

## 2010-06-18 ENCOUNTER — Encounter: Payer: Self-pay | Admitting: Family Medicine

## 2010-06-18 ENCOUNTER — Encounter: Payer: Self-pay | Admitting: Thoracic Surgery (Cardiothoracic Vascular Surgery)

## 2010-06-27 NOTE — Assessment & Plan Note (Signed)
Summary: rx renewal...em   History of Present Illness Visit Type: Initial Visit Primary GI MD: Sheryn Bison MD FACP FAGA Primary Provider: Rudi Heap, MD Chief Complaint: Needs renewal of her Nexium which was d/c'd by Dr. Christell Constant x 1 month  h/o chest pain in August  status post CABG #2 in Feb. 2010 History of Present Illness:   77 year old Caucasian female referred by Dr. Rudi Heap for evaluation of recurrent chest pain.   [name] has coronary artery disease has had bypass surgery on 2 occasions and is followed closely by Dr. Melburn Popper in cardiology. She is chronically on aspirin, topical XL, and nitroglycerin. She previously had endoscopy and colonoscopy several years ago which were unremarkable. However, she was placed on daily Nexium with good improvement in her symptoms has had worsening of her reflux, regurgitation, and anterior left precordial chest pain since she ran out of this medication. She denies dysphagia or specific hepatobiliary complaints or any history of hepatitis or pancreatitis. I cannot see where she's had previous upper abdominal ultrasound exam. She specifically denies jaundice, clay colored stools, icterus, fever chills. Her appetite is good and her weight is stable.    GI Review of Systems    Reports acid reflux and  chest pain.      Denies abdominal pain, belching, bloating, dysphagia with liquids, dysphagia with solids, heartburn, loss of appetite, nausea, vomiting, vomiting blood, weight loss, and  weight gain.        Denies anal fissure, black tarry stools, change in bowel habit, constipation, diarrhea, diverticulosis, fecal incontinence, heme positive stool, hemorrhoids, irritable bowel syndrome, jaundice, light color stool, liver problems, rectal bleeding, and  rectal pain. Preventive Screening-Counseling & Management      Drug Use:  no.      Current Medications (verified): 1)  Calcium 1200 1200-1000 Mg-Unit  Chew (Calcium Carbonate-Vit D-Min) .... Take 1  Tablet By Mouth Once A Day 2)  Aspirin Low Dose 81 Mg  Tabs (Aspirin) .... Take 1 Tablet By Mouth Once A Day 3)  Crestor 20 Mg  Tabs (Rosuvastatin Calcium) .... Take 1 Tablet By Mouth Once A Day 4)  Toprol Xl 100 Mg Xr24h-Tab (Metoprolol Succinate) .Marland Kitchen.. 1 Tablet By Mouth Once Daily 5)  Nexium 40 Mg  Cpdr (Esomeprazole Magnesium) .... Take 1 Tablet By Mouth Once A Day 6)  Nitroquick 0.4 Mg  Subl (Nitroglycerin) .... Take By Mouth As Needed 7)  Imdur 30 Mg  Tb24 (Isosorbide Mononitrate) .... Take One Tablet Daily. 8)  Vitamin D 1000 Unit Tabs (Cholecalciferol) .... Take Tablet By Mouth Once Daily  Allergies (verified): 1)  ! Compazine 2)  ! Sulfa 3)  ! * Niaspan  Past History:  Past medical, surgical, family and social histories (including risk factors) reviewed for relevance to current acute and chronic problems.  Past Medical History: CORONARY ARTERY DISEASE (ICD-414.00) HYPERTENSION (ICD-401.9) CHEST PAIN, ATYPICAL (ICD-786.59)  Sleep Apnea- NPSG 09/25/07 AHI 11  Past Surgical History: Reviewed history from 09/19/2007 and no changes required. One ovary removed Appendectomy CABG- 5 V  colon-diverticulosis  Family History: Reviewed history from 10/13/2007 and no changes required. nephew 75 yo had tonilectomy for sleep apnea allergy arthritis Brother - emphysema Father and mother - heart disease  ~ Both dec'd Mother - rheumatism Brother - lung cancer Sister - stroke No FH of Colon Cancer:  Social History: Reviewed history from 10/13/2007 and no changes required. Patient never smoked.  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 Alcohol Use - no Daily Caffeine Use  1 per day Illicit Drug Use - no Drug Use:  no  Review of Systems  The patient denies anemia, anxiety-new, blood in urine, breast changes/lumps, confusion, cough, coughing up blood, depression-new, fainting, fever, headaches-new, hearing problems, heart murmur, heart rhythm changes, itching, menstrual pain,  muscle pains/cramps, nosebleeds, pregnancy symptoms, shortness of breath, skin rash, sore throat, swelling of feet/legs, swollen lymph glands, thirst - excessive , urination - excessive , urination changes/pain, urine leakage, vision changes, voice change, allergy/sinus, arthritis/joint pain, back pain, change in vision, fatigue, night sweats, and sleeping problems.    Vital Signs:  Patient profile:   77 year old female Height:      64.25 inches Weight:      154 pounds BMI:     26.32 Pulse rate:   80 / minute Pulse rhythm:   regular BP sitting:   126 / 58  (left arm)  Vitals Entered By: Milford Cage NCMA (July 12, 2009 9:17 AM)  Physical Exam  General:  Well developed, well nourished, no acute distress.healthy appearing.   Head:  Normocephalic and atraumatic. Eyes:  PERRLA, no icterus.exam deferred to patient's ophthalmologist.   Neck:  Supple; no masses or thyromegaly. Lungs:  Clear throughout to auscultation. Heart:  Regular rate and rhythm; no murmurs, rubs,  or bruits. Abdomen:  Soft, nontender and nondistended. No masses, hepatosplenomegaly or hernias noted. Normal bowel sounds. Msk:  Symmetrical with no gross deformities. Normal posture. Pulses:  Normal pulses noted. Extremities:  No clubbing, cyanosis, edema or deformities noted. Neurologic:  Alert and  oriented x4;  grossly normal neurologically. Cervical Nodes:  No significant cervical adenopathy. Inguinal Nodes:  No significant inguinal adenopathy. Psych:  Alert and cooperative. Normal mood and affect.   Impression & Recommendations:  Problem # 1:  ESOPHAGEAL REFLUX (ICD-530.81) Assessment Deteriorated Resume Nexium 40 mg a day 30 minutes before breakfast with standard antireflux maneuvers. I have ordered upper abdominal ultrasound exam to exclude cholelithiasis. Screening lab tests have also been ordered. I see no need for endoscopic exam at this time. Orders: TLB-CBC Platelet - w/Differential  (85025-CBCD) TLB-BMP (Basic Metabolic Panel-BMET) (80048-METABOL) TLB-Hepatic/Liver Function Pnl (80076-HEPATIC) TLB-TSH (Thyroid Stimulating Hormone) (84443-TSH) TLB-B12, Serum-Total ONLY (78295-A21) TLB-Ferritin (82728-FER) TLB-Folic Acid (Folate) (82746-FOL) TLB-IBC Pnl (Iron/FE;Transferrin) (83550-IBC) Ultrasound Abdomen (UAS)  Problem # 2:  CHEST PAIN (ICD-786.50) Assessment: Improved This seemed to be related to acid reflux and discontinuation of PPI therapy. We have given her samples and scripts for Nexium to be taken one to 2 times a day as needed for symptom control. We will consider esophageal manometry and 24-hour pH probe testing depending on her clinical course. She is to continue all of her cardiac medications as per her cardiologist. Orders: TLB-CBC Platelet - w/Differential (85025-CBCD) TLB-BMP (Basic Metabolic Panel-BMET) (80048-METABOL) TLB-Hepatic/Liver Function Pnl (80076-HEPATIC) TLB-TSH (Thyroid Stimulating Hormone) (84443-TSH) TLB-B12, Serum-Total ONLY (30865-H84) TLB-Ferritin (82728-FER) TLB-Folic Acid (Folate) (82746-FOL) TLB-IBC Pnl (Iron/FE;Transferrin) (83550-IBC) Ultrasound Abdomen (UAS)  Problem # 3:  HEPATITIS, ACUTE (ICD-070.59) Assessment: Deteriorated Apparently in the past she has had hepatitis A but has no history of chronic hepatitis or chronic liver disease.Upper Abdominal ultrasound exam has been ordered. Liver function tests are also pending.  Problem # 4:  DIVERTICULOSIS, COLON (ICD-562.10) Assessment: Improved Continue high-fiber diet as tolerated. She apparently has had previous surgery for diverticulitis. Other surgical procedures have included appendectomy.She is up-to-date on colonoscopy exams.  Problem # 5:  HYPERTENSION (ICD-401.9) Assessment: Improved Blood Pressure today is 126/58 and she is to continue all other medications as per Dr.  Moore  Patient Instructions: 1)  Copy sent to : Dr. Rudi Heap and Dr. Laqueta Carina 2)   Please continue current medications.  3)  Avoid foods high in acid content ( tomatoes, citrus juices, spicy foods) . Avoid eating within 3 to 4 hours of lying down or before exercising. Do not over eat; try smaller more frequent meals. Elevate head of bed four inches when sleeping.  4)  Nexium 40 mg 30 minutes before breakfast and twice a day if needed 5)  Labs pending 6)  Upper abdominal ultrasound exam scheduled 7)  The medication list was reviewed and reconciled.  All changed / newly prescribed medications were explained.  A complete medication list was provided to the patient / caregiver. Prescriptions: NEXIUM 40 MG  CPDR (ESOMEPRAZOLE MAGNESIUM) Take 1 tablet by mouth once a day  #30 x 11   Entered by:   Ashok Cordia RN   Authorized by:   Mardella Layman MD [facility]   Signed by:   Ashok Cordia RN on 07/12/2009   Method used:   Electronically to        [facility]  [address] [phone]* (retail)       [address]       [place], [place]  [address]       Ph: [phone]       Fax: [phone]   RxID:   [id number]

## 2010-06-27 NOTE — Procedures (Signed)
Summary: Colon   Colonoscopy  Procedure date:  02/24/2003  Findings:      Location:  [facility].   Patient Name: [name], [name] MRN:  Procedure Procedures: Colonoscopy CPT: 45409.  Personnel: Endoscopist: Vania Rea. Jarold Motto, MD.  Exam Location: Exam performed in Outpatient Clinic. Outpatient  Patient Consent: Procedure, Alternatives, Risks and Benefits discussed, consent obtained, from patient. Consent was obtained by the RN.  Indications  Average Risk Screening Routine.  Comments: Previous bowel resection for diverticulitis. History  Pre-Exam Physical: Performed Feb 24, 2003. Cardio-pulmonary exam, Rectal exam, Abdominal exam, Extremity exam, Mental status exam WNL.  Exam Exam: Extent of exam reached: Cecum, extent intended: Cecum.  The cecum was identified by appendiceal orifice and IC valve. Patient position: on left side. Duration of exam: 20 minutes. Colon retroflexion performed. Images taken. ASA Classification: II. Tolerance: excellent.  Monitoring: Pulse and BP monitoring, Oximetry used. Supplemental O2 given. at 2 Liters.  Colon Prep Used Golytely for colon prep. Prep results: fair, adequate exam.  Sedation Meds: Patient assessed and found to be appropriate for moderate (conscious) sedation. Fentanyl 75 mcg. given IV. Versed 7 mg. given IV.  Instrument(s): CF 140L. Serial [id number].  Findings - DIVERTICULOSIS: Descending Colon to Sigmoid Colon. Not bleeding. ICD9: Diverticulosis, Colon: 562.10. Comments: Numerous persisent tics noted.  - NORMAL EXAM: Cecum to Rectum. Not Seen: Polyps. AVM's. Colitis. Tumors. Crohn's. Hemorrhoids.   Assessment Normal examination.  Diagnoses: 562.10: Diverticulosis, Colon.   Events  Unplanned Interventions: No intervention was required.  Plans Medication Plan: Continue current medications.  Patient Education: Patient given standard instructions for: Diverticulosis.  Disposition: After  procedure patient sent to recovery. After recovery patient sent home.  Scheduling/Referral: Follow-Up prn.    cc: Rudi Heap, MD   This report was created from the original endoscopy report, which was reviewed and signed by the above listed endoscopist.

## 2010-06-27 NOTE — Procedures (Signed)
Summary: EGD   EGD  Procedure date:  07/02/2005  Findings:      Location: [facility]   Patient Name: [name], Gores MRN:  Procedure Procedures: Panendoscopy (EGD) CPT: 43235.  Personnel: Endoscopist: Vania Rea. Jarold Motto, MD.  Exam Location: Exam performed in Outpatient Clinic. Outpatient  Patient Consent: Procedure, Alternatives, Risks and Benefits discussed, consent obtained, from patient. Consent was obtained by the RN.  Indications Symptoms: Chest Pain.  History  Current Medications: Patient is not currently taking Coumadin.  Results of Prior Studies: ultrasound on Jun 25, 2005: normal.   Pre-Exam Physical: Performed Jul 02, 2005  Cardio-pulmonary exam, Abdominal exam, Extremity exam, Mental status exam WNL.  Comments: Pt. history reviewed/updated, physical exam performed prior to initiation of sedation?yes Exam Exam Info: Maximum depth of insertion Duodenum, intended Duodenum. Patient position: on left side. Duration of exam: 15 minutes. Vocal cords visualized. Gastric retroflexion performed. Images taken. ASA Classification: II. Tolerance: excellent.  Sedation Meds: Patient assessed and found to be appropriate for moderate (conscious) sedation. Fentanyl 50 mcg. given IV. Versed 5 mg. given IV. Cetacaine Spray 1 sprays given aerosolized.  Monitoring: BP and pulse monitoring done. Oximetry used. Supplemental O2 given at 2 Liters.  Instrument(s): GIF 160. Serial [id number].   Findings - Normal: Cardia to Duodenal 2nd Portion. Not Seen: Tumor. Ulcer. Mucosal abnormality. AVM's. Foreign body. Varices.  - Normal: Proximal Esophagus to Distal Esophagus. Tumor. Barrett's esophagus. Esophageal inflammation. Mucosal abnormality. Stricture. Varices.   Assessment Normal examination.  Comments: DIAGNOSIS HERE UNCLEAR....either cardiac or esophageal-----will rule out esophageal spasm {primary} vs. from acid reflux.... Events  Unplanned  Intervention: No unplanned interventions were required.  Plans Medication(s): Continue current medications.  Disposition: After procedure patient sent to recovery. After recovery patient sent home.  Comments: OP esophageal manometry and 24hour pH probe studies.    cc: Kristeen Miss, MD     Rudi Heap, MD   This report was created from the original endoscopy report, which was reviewed and signed by the above listed endoscopist.

## 2010-06-27 NOTE — Progress Notes (Signed)
Summary: needs nexium  Phone Note Call from Patient Call back at Home Phone [phone]   Caller: mr rankin Call For: [name] Summary of Call: mrs rankin has not seen dr Jera Headings since 2009. i made her an apt to come into see him on friday. she only has 1 pill of nexium left. can we call more into the pharmacy untill then or could we give her some samples.   [facility] [address] phone number [phone] Initial call taken by: Valinda Hoar,  July 11, 2009 12:21 PM  Follow-up for Phone Call        advised CY sees pt for sleep, and did not rx the nexium. I advised her to call her GI doctor of PMD. Carron Curie [facility]  July 11, 2009 12:28 PM

## 2010-06-27 NOTE — Assessment & Plan Note (Signed)
Summary: fu 2 wks after sleep study////kp   Visit Type:  Follow-up PCP:  Vernon Prey  Chief Complaint:  review sleep study.  History of Present Illness: Current Problems:  SLEEP APNEA (ICD-780.57) CORONARY ARTERY DISEASE (ICD-414.00) HYPERTENSION (ICD-401.9) CHEST PAIN, ATYPICAL (ICD-786.59)  [name] returns for follow-up after her sleep study done April 30.  This confirmed mild sleep apnea with an index of 11/hr, moderate snoring, desaturation to 80%.  She could not tolerate CPAP.  She found the experience frightening and somewhat claustrophobic even at low pressures.           Current Allergies: ! COMPAZINE ! SULFA ! * NIASPAN  Past Medical History:    Reviewed history from 09/18/2007 and no changes required:       Current Problems:        CORONARY ARTERY DISEASE (ICD-414.00)       HYPERTENSION (ICD-401.9)       CHEST PAIN, ATYPICAL (ICD-786.59)        Sleep Apnea- NPSG 09/25/07 AHI 11  Past Surgical History:    Reviewed history from 09/19/2007 and no changes required:       One ovary removed       Appendectomy       CABG- 5 V        colon-diverticulosis   Family History:    Reviewed history from 09/19/2007 and no changes required:       nephew 28 yo had tonilectomy for sleep apnea       allergy       arthritis       Brother - emphysema       Father and mother - heart disease  ~ Both dec'd       Mother - rheumatism       Brother - lung cancer       Sister - stroke  Social History:    Reviewed history from 09/19/2007 and no changes required:       Patient never smoked.        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    Review of Systems      See HPI   Vital Signs:  Patient Profile:   77 Years Old Female Weight:      151.38 pounds O2 Sat:      97 % O2 treatment:    Room Air Pulse rate:   75 / minute BP sitting:   112 / 62  (left arm)  Vitals Entered By: Marinus Maw (Oct 13, 2007 3:22 PM)             Comments Medications  reviewed with patient Marinus Maw  Oct 13, 2007 3:24 PM      Physical Exam  General:     thin.   Nose:     no deformity, discharge, inflammation, or lesions Mouth:     Melampatti Class II.   Neck:     no JVD.   Lungs:     clear bilaterally to auscultation and percussion Heart:     regular rate and rhythm, S1, S2 without murmurs, rubs, gallops, or clicks     Impression & Recommendations:  Problem # 1:  ? of SLEEP APNEA (ICD-780.57) mild obstructive sleep apnea with an index of 11/hr.  She did not tolerate initial exposure to CPAP.  We discussed the physiology of sleep apnea, available treatments. I left her to consider options.  Her scores were low enough that it  really won't be critical.  If she chooses.  No therapy at all.  We did review good sleep hygiene and answered her questions. Orders: Est. Patient Level III ([address]) Sleep Disorder Referral (Sleep Disorder)   Medications Added to Medication List This Visit: 1)  Imdur 30 Mg Tb24 (Isosorbide mononitrate) .... Take one tablet daily.   Patient Instructions: 1)  Please schedule a follow-up appointment as needed 2)  [facility] will give contact information for Dental [facility] at [facility] 3)  Let me know if you decide you would like to look again at CPAP.   ]

## 2010-06-27 NOTE — Miscellaneous (Signed)
Summary: Approval for Nexium   Case ID: [id number] Member Number: [id number] Case Type: Initial Review Case Start Date: 12/29/2009 Case Status: Coverage has been APPROVED. You will receive a confirmation letter confirming approval of this medication. The patient will also be notified of this approval via an automated outbound phone call or a letter. Please allow approximately 2 hours to update our system with the approval. Once updated, the prescription can be re-submitted.   Coverage Start Date: 12/08/2009 Coverage End Date: 12/29/2010  Patient First Name: Jerzee Patient Last Name: Gerhold DOB: 02/15/1929 Patient Street Address: [address]   Patient City: [place] Patient State: [place] Patient Zip: [phone]  Drug Name & Strength: Nexium 40 Mg  Clinical Lists Changes

## 2010-06-27 NOTE — Assessment & Plan Note (Signed)
Summary: EVAL. SLEEP APNEA/APC   Visit Type:  Initial Consult PCP:  Vernon Prey  Chief Complaint:  sleep consult.  History of Present Illness: Current Problems:  CORONARY ARTERY DISEASE (ICD-414.00) HYPERTENSION (ICD-401.9) CHEST PAIN, ATYPICAL (ICD-786.5  Mrs. Slifer is seen in  sleep medicine consultation at the request of Dr. Vernon Prey.  She comes with her husband, a patient here.  Together they describe witnessed apneas, and loud snoring.  She is not a sound sleeper.  She is not aware of shortness of breath at night except when allergy season increases nasal congestion.  She wakes frequently, and for the bathroom.  She is usually slow to return to sleep.  This problem has been present over the last two to 3 years.  She is physically active during the day with no naps, but does admit some sleepiness if she sits.  Bed time is between 9 and 10 p.m.  Sleep latency is less than 30 minutes.  She estimates waking 3 or 4 times during the night before finally up between 6 and 7 a.m.Marland Kitchen  She denies any history of ENT surgery except nasal fracture once was straightened.  There is no history of thyroid disease . 1 cup of coffee, some tea. Had bruxism, quit pad. Sleeps on one pillow.Indigestion       Current Allergies: ! COMPAZINE ! SULFA ! * NIASPAN  Past Medical History:    Reviewed history from 09/18/2007 and no changes required:       Current Problems:        CORONARY ARTERY DISEASE (ICD-414.00)       HYPERTENSION (ICD-401.9)       CHEST PAIN, ATYPICAL (ICD-786.59)          Past Surgical History:    Reviewed history and no changes required:       One ovary removed       Appendectomy       CABG- 5 V        colon-diverticulosis   Family History:    Reviewed history and no changes required:       nephew 55 yo had tonilectomy for sleep apnea       allergy       arthritis  Social History:    Reviewed history and no changes required:       Patient never smoked.    Risk Factors:  Tobacco use:  never   Review of Systems      See HPI   Vital Signs:  Patient Profile:   77 Years Old Female Weight:      149.13 pounds O2 Sat:      95 % O2 treatment:    Room Air Pulse rate:   81 / minute BP sitting:   120 / 60  (right arm) Cuff size:   regular  Vitals Entered By: Darra Lis RMA (September 19, 2007 3:10 PM)             Is Patient Diabetic? No Comments Medications reviewed with patient ..................................................................Marland KitchenDarra Lis RMA  September 19, 2007 3:12 PM      Physical Exam  General:     trim alert Nose:     minor septal deviation Mouth:     Melampatti Class II.   Neck:     no JVD.   Lungs:     clear bilaterally to auscultation and percussion Heart:     regular rate and rhythm, S1, S2 without murmurs, rubs, gallops, or clicks Extremities:  no clubbing, cyanosis, edema, or deformity noted Neurologic:     grossly normal to observation     Impression & Recommendations:  Problem # 1:  ? of SLEEP APNEA (ICD-780.57) History is more impressive than her physical exam. We discussed the diagnosis and medical concerns of sleep apnea. We are scheduling an NPSG. There is minor rhinitis and hx of cad with cabg.   Patient Instructions: 1)  F/U after sleep study 2)  See [facility] to schedule sleep study    ]

## 2010-07-25 ENCOUNTER — Other Ambulatory Visit: Payer: Self-pay | Admitting: Family Medicine

## 2010-07-25 DIAGNOSIS — R635 Abnormal weight gain: Secondary | ICD-10-CM

## 2010-07-28 ENCOUNTER — Ambulatory Visit
Admission: RE | Admit: 2010-07-28 | Discharge: 2010-07-28 | Disposition: A | Discharge status: Home / Self Care | Payer: Medicare Other | Source: Ambulatory Visit | Attending: Family Medicine | Admitting: Family Medicine

## 2010-07-28 DIAGNOSIS — R635 Abnormal weight gain: Secondary | ICD-10-CM

## 2010-07-28 MED ORDER — IOHEXOL 300 MG/ML  SOLN
100.0000 mL | Freq: Once | INTRAMUSCULAR | Status: AC | PRN
  Administered 2010-07-28: 100 mL via INTRAVENOUS

## 2010-08-24 ENCOUNTER — Telehealth: Payer: Self-pay | Admitting: Cardiovascular Disease

## 2010-08-24 DIAGNOSIS — I2510 Atherosclerotic heart disease of native coronary artery without angina pectoris: Secondary | ICD-10-CM

## 2010-08-24 DIAGNOSIS — E785 Hyperlipidemia, unspecified: Secondary | ICD-10-CM

## 2010-08-24 MED ORDER — ROSUVASTATIN CALCIUM 20 MG PO TABS: 20.0000 mg | ORAL_TABLET | Freq: Every day | ORAL | Status: DC

## 2010-08-24 MED ORDER — METOPROLOL SUCCINATE ER 50 MG PO TB24: 50.0000 mg | ORAL_TABLET | Freq: Two times a day (BID) | ORAL | Status: DC

## 2010-08-24 NOTE — Telephone Encounter (Signed)
Refill to [facility] per pt.

## 2010-08-24 NOTE — Telephone Encounter (Signed)
PT'S HUSBAND CALLED, SAID TWO MEDS NEED TO BE CALLED INTO OR FAXED TO [facility]. THEY ARE: CRESTOR AND METOPROLOL. PLACED CHART IN BOX.

## 2010-08-28 LAB — COMPREHENSIVE METABOLIC PANEL
ALT: 15 U/L (ref 0–35)
AST: 20 U/L (ref 0–37)
Albumin: 3.9 g/dL (ref 3.5–5.2)
Alkaline Phosphatase: 67 U/L (ref 39–117)
BUN: 12 mg/dL (ref 6–23)
CO2: 28 mEq/L (ref 19–32)
Calcium: 9 mg/dL (ref 8.4–10.5)
Chloride: 101 mEq/L (ref 96–112)
Creatinine, Ser: 0.83 mg/dL (ref 0.4–1.2)
GFR calc Af Amer: >60 mL/min (ref >60)
GFR calc non Af Amer: >60 mL/min (ref >60)
Glucose, Bld: 114 mg/dL — ABNORMAL HIGH (ref 70–99)
Potassium: 3.3 mEq/L — ABNORMAL LOW (ref 3.5–5.1)
Sodium: 139 mEq/L (ref 135–145)
Total Bilirubin: 0.5 mg/dL (ref 0.3–1.2)
Total Protein: 6.5 g/dL (ref 6.0–8.3)

## 2010-08-28 LAB — POCT CARDIAC MARKERS
CKMB, poc: 1.3 ng/mL (ref 1.0–8.0)
CKMB, poc: <1 ng/mL — ABNORMAL LOW (ref 1.0–8.0)
Myoglobin, poc: 53.2 ng/mL (ref 12–200)
Myoglobin, poc: 55.3 ng/mL (ref 12–200)
Troponin i, poc: <0.05 ng/mL (ref 0.00–0.09)
Troponin i, poc: <0.05 ng/mL (ref 0.00–0.09)

## 2010-08-28 LAB — DIFFERENTIAL
Basophils Absolute: 0 10*3/uL (ref 0.0–0.1)
Basophils Relative: 1 % (ref 0–1)
Eosinophils Absolute: 0.1 10*3/uL (ref 0.0–0.7)
Eosinophils Relative: 1 % (ref 0–5)
Lymphocytes Relative: 35 % (ref 12–46)
Lymphs Abs: 3 10*3/uL (ref 0.7–4.0)
Monocytes Absolute: 0.8 10*3/uL (ref 0.1–1.0)
Monocytes Relative: 9 % (ref 3–12)
Neutro Abs: 4.8 10*3/uL (ref 1.7–7.7)
Neutrophils Relative %: 55 % (ref 43–77)

## 2010-08-28 LAB — CBC
HCT: 37 % (ref 36.0–46.0)
Hemoglobin: 12.8 g/dL (ref 12.0–15.0)
MCHC: 34.6 g/dL (ref 30.0–36.0)
MCV: 92.6 fL (ref 78.0–100.0)
Platelets: 198 10*3/uL (ref 150–400)
RBC: 3.99 MIL/uL (ref 3.87–5.11)
RDW: 12.8 % (ref 11.5–15.5)
WBC: 8.7 10*3/uL (ref 4.0–10.5)

## 2010-08-31 ENCOUNTER — Encounter: Payer: Self-pay | Admitting: Cardiovascular Disease

## 2010-09-04 ENCOUNTER — Encounter: Payer: Self-pay | Admitting: Cardiovascular Disease

## 2010-09-04 ENCOUNTER — Ambulatory Visit (INDEPENDENT_AMBULATORY_CARE_PROVIDER_SITE_OTHER): Payer: Medicare Other | Admitting: Cardiovascular Disease

## 2010-09-04 DIAGNOSIS — R5383 Other fatigue: Secondary | ICD-10-CM | POA: Insufficient documentation

## 2010-09-04 DIAGNOSIS — R5381 Other malaise: Secondary | ICD-10-CM

## 2010-09-04 DIAGNOSIS — I2510 Atherosclerotic heart disease of native coronary artery without angina pectoris: Secondary | ICD-10-CM

## 2010-09-04 LAB — CBC
HCT: 37.1 % (ref 36.0–46.0)
Hemoglobin: 12.7 g/dL (ref 12.0–15.0)
MCHC: 34.2 g/dL (ref 30.0–36.0)
MCV: 91.9 fL (ref 78.0–100.0)
Platelets: 175 10*3/uL (ref 150–400)
RBC: 4.03 MIL/uL (ref 3.87–5.11)
RDW: 13.4 % (ref 11.5–15.5)
WBC: 7.9 10*3/uL (ref 4.0–10.5)

## 2010-09-04 LAB — DIFFERENTIAL
Basophils Absolute: 0.1 10*3/uL (ref 0.0–0.1)
Basophils Relative: 1 % (ref 0–1)
Eosinophils Absolute: 0 10*3/uL (ref 0.0–0.7)
Eosinophils Relative: 0 % (ref 0–5)
Lymphocytes Relative: 27 % (ref 12–46)
Lymphs Abs: 2.1 10*3/uL (ref 0.7–4.0)
Monocytes Absolute: 0.5 10*3/uL (ref 0.1–1.0)
Monocytes Relative: 6 % (ref 3–12)
Neutro Abs: 5.2 10*3/uL (ref 1.7–7.7)
Neutrophils Relative %: 66 % (ref 43–77)

## 2010-09-04 LAB — POCT I-STAT, CHEM 8
BUN: 12 mg/dL (ref 6–23)
Calcium, Ion: 1.11 mmol/L — ABNORMAL LOW (ref 1.12–1.32)
Chloride: 106 mEq/L (ref 96–112)
Creatinine, Ser: 0.8 mg/dL (ref 0.4–1.2)
Glucose, Bld: 120 mg/dL — ABNORMAL HIGH (ref 70–99)
HCT: 39 % (ref 36.0–46.0)
Hemoglobin: 13.3 g/dL (ref 12.0–15.0)
Potassium: 3.9 mEq/L (ref 3.5–5.1)
Sodium: 140 mEq/L (ref 135–145)
TCO2: 22 mmol/L (ref 0–100)

## 2010-09-04 LAB — URINALYSIS, ROUTINE W REFLEX MICROSCOPIC
Bilirubin Urine: NEGATIVE
Glucose, UA: NEGATIVE mg/dL
Hgb urine dipstick: NEGATIVE
Ketones, ur: NEGATIVE mg/dL
Nitrite: NEGATIVE
Protein, ur: NEGATIVE mg/dL
Specific Gravity, Urine: 1.016 (ref 1.005–1.030)
Urobilinogen, UA: 0.2 mg/dL (ref 0.0–1.0)
pH: 7.5 (ref 5.0–8.0)

## 2010-09-04 LAB — LIPASE, BLOOD: Lipase: 17 U/L (ref 11–59)

## 2010-09-04 LAB — HEPATIC FUNCTION PANEL
ALT: 14 U/L (ref 0–35)
AST: 27 U/L (ref 0–37)
Albumin: 3.8 g/dL (ref 3.5–5.2)
Alkaline Phosphatase: 64 U/L (ref 39–117)
Bilirubin, Direct: <0.1 mg/dL (ref 0.0–0.3)
Total Bilirubin: 0.6 mg/dL (ref 0.3–1.2)
Total Protein: 6.4 g/dL (ref 6.0–8.3)

## 2010-09-04 LAB — POCT CARDIAC MARKERS
CKMB, poc: <1 ng/mL — ABNORMAL LOW (ref 1.0–8.0)
Myoglobin, poc: 97 ng/mL (ref 12–200)
Troponin i, poc: <0.05 ng/mL (ref 0.00–0.09)

## 2010-09-04 NOTE — Progress Notes (Signed)
History of Present Illness:  [name] is an elderly female the history of coronary artery disease. She status post coronary artery bypass grafting x2. Her last surgery was in 2010. She has not had any episodes of chest pain or shortness breath. She has been exercising on regular basis but has not been losing any weight. She has been drinking "Boost" every day to give her more energy.  She stopped exercising after she got frustrated about her lack of weight loss.  Current Outpatient Prescriptions on File Prior to Visit  Medication Sig Dispense Refill  . aspirin 325 MG tablet Take 325 mg by mouth daily.        . Cholecalciferol (VITAMIN D PO) Take by mouth daily.        Marland Kitchen esomeprazole (NEXIUM) 40 MG capsule Take 40 mg by mouth daily.        . isosorbide mononitrate (IMDUR) 60 MG 24 hr tablet Take 60 mg by mouth daily.        . metoprolol (TOPROL XL) 50 MG 24 hr tablet Take 1 tablet (50 mg total) by mouth 2 (two) times daily at 10 AM and 5 PM.  180 tablet  3  . nitroGLYCERIN (NITROSTAT) 0.4 MG SL tablet Place 0.4 mg under the tongue every 5 (five) minutes as needed.        . rosuvastatin (CRESTOR) 20 MG tablet Take 1 tablet (20 mg total) by mouth at bedtime.  90 tablet  3    Allergies  Allergen Reactions  . Niacin   . Prochlorperazine Edisylate   . Sulfonamide Derivatives     Past Medical History  Diagnosis Date  . CAD (coronary artery disease)   . CHF (congestive heart failure)   . Hyperlipemia   . Back pain   . Pleural effusion     post surgical  . PVD (peripheral vascular disease)   . GERD (gastroesophageal reflux disease)   . History of cerebrovascular disease   . Asthma   . Diverticulosis     Past Surgical History  Procedure Date  . Coronary artery bypass graft 6045,4098  . Cardiac catheterization   . Partial colectomy   . Knee surgery     left  . Carotid endarterectomy     right    History  Smoking status  . Never Smoker   Smokeless tobacco  . Not on file     History  Alcohol Use     No family history on file.  Reviw of Systems:  The patient denies any heat or cold intolerance.  No weight gain or weight loss.  The patient denies headaches or blurry vision.  There is no cough or sputum production.  The patient denies dizziness.  There is no hematuria or hematochezia.  The patient denies any muscle aches or arthritis.  The patient denies any rash.  The patient denies frequent falling or instability.  There is no history of depression or anxiety.  All other systems were reviewed and are negative.  Physical Exam: BP 118/88  Pulse 67  Ht 5\' 5"  (1.651 m)  Wt 153 lb 12.8 oz (69.763 kg)  BMI 25.59 kg/m2 The patient is alert and oriented x 3.  The mood and affect are normal.  The skin is warm and dry.  Color is normal.  The HEENT exam reveals that the sclera are nonicteric.  The mucous membranes are moist.  The carotids are 2+ without bruits. R CEA scar.   There is no thyromegaly.  There  is no JVD.  The lungs are clear.  The chest wall is non tender.  The heart exam reveals a regular rate with a normal S1 and S2.  There are no murmurs, gallops, or rubs.  The PMI is not displaced.   Abdominal exam reveals good bowel sounds.  There is no guarding or rebound.  There is no hepatosplenomegaly or tenderness.  There are no masses.  Exam of the legs reveal no clubbing, cyanosis, or edema.  The legs are without rashes.  The distal pulses are intact.  Cranial nerves II - XII are intact.  Motor and sensory functions are intact.  The gait is normal.  ECG: Normal sinus rhythm. She has an old anteroseptal myocardial infarction. Assessment / Plan:

## 2010-09-04 NOTE — Assessment & Plan Note (Signed)
I do not have a clear etiology for her fatigue. I've asked her to check with her general medical Dr. Peggye Form asked her to stop drinking the boost since is causing so much waking. She is drinking an extra 325 calories a day with each can of Boost.

## 2010-09-04 NOTE — Assessment & Plan Note (Signed)
[name] is doing very well from a cardiac standpoint. I am pleased that she's not had any episodes of angina. She is to continue with her same medications. I've encouraged her to exercise on regular basis.

## 2010-09-04 NOTE — Patient Instructions (Signed)
Exercise regularly.  Stop taking Boost.

## 2010-09-07 LAB — DIFFERENTIAL
Basophils Absolute: 0 10*3/uL (ref 0.0–0.1)
Basophils Relative: 0 % (ref 0–1)
Eosinophils Absolute: 0.5 10*3/uL (ref 0.0–0.7)
Eosinophils Relative: 5 % (ref 0–5)
Lymphocytes Relative: 18 % (ref 12–46)
Lymphs Abs: 1.8 10*3/uL (ref 0.7–4.0)
Monocytes Absolute: 1.1 10*3/uL — ABNORMAL HIGH (ref 0.1–1.0)
Monocytes Relative: 11 % (ref 3–12)
Neutro Abs: 6.5 10*3/uL (ref 1.7–7.7)
Neutrophils Relative %: 65 % (ref 43–77)

## 2010-09-07 LAB — BASIC METABOLIC PANEL
BUN: 9 mg/dL (ref 6–23)
CO2: 27 mEq/L (ref 19–32)
Calcium: 8.8 mg/dL (ref 8.4–10.5)
Chloride: 99 mEq/L (ref 96–112)
Creatinine, Ser: 0.77 mg/dL (ref 0.4–1.2)
GFR calc Af Amer: >60 mL/min (ref >60)
GFR calc non Af Amer: >60 mL/min (ref >60)
Glucose, Bld: 118 mg/dL — ABNORMAL HIGH (ref 70–99)
Potassium: 4.1 mEq/L (ref 3.5–5.1)
Sodium: 133 mEq/L — ABNORMAL LOW (ref 135–145)

## 2010-09-07 LAB — BODY FLUID CULTURE: Culture: NO GROWTH

## 2010-09-07 LAB — CBC
HCT: 31.6 % — ABNORMAL LOW (ref 36.0–46.0)
Hemoglobin: 10.9 g/dL — ABNORMAL LOW (ref 12.0–15.0)
MCHC: 34.6 g/dL (ref 30.0–36.0)
MCV: 89.4 fL (ref 78.0–100.0)
Platelets: 473 10*3/uL — ABNORMAL HIGH (ref 150–400)
RBC: 3.54 MIL/uL — ABNORMAL LOW (ref 3.87–5.11)
RDW: 15.1 % (ref 11.5–15.5)
WBC: 10 10*3/uL (ref 4.0–10.5)

## 2010-09-11 LAB — BASIC METABOLIC PANEL
BUN: 11 mg/dL (ref 6–23)
CO2: 31 mEq/L (ref 19–32)
Calcium: 9.6 mg/dL (ref 8.4–10.5)
Chloride: 104 mEq/L (ref 96–112)
Creatinine, Ser: 0.82 mg/dL (ref 0.4–1.2)
GFR calc Af Amer: >60 mL/min (ref >60)
GFR calc non Af Amer: >60 mL/min (ref >60)
Glucose, Bld: 110 mg/dL — ABNORMAL HIGH (ref 70–99)
Potassium: 4.8 mEq/L (ref 3.5–5.1)
Sodium: 142 mEq/L (ref 135–145)

## 2010-09-11 LAB — CBC
HCT: 40.1 % (ref 36.0–46.0)
Hemoglobin: 13.6 g/dL (ref 12.0–15.0)
MCHC: 33.9 g/dL (ref 30.0–36.0)
MCV: 91.7 fL (ref 78.0–100.0)
Platelets: 203 10*3/uL (ref 150–400)
RBC: 4.38 MIL/uL (ref 3.87–5.11)
RDW: 13.1 % (ref 11.5–15.5)
WBC: 7.5 10*3/uL (ref 4.0–10.5)

## 2010-09-11 LAB — PROTIME-INR
INR: 1 (ref 0.00–1.49)
Prothrombin Time: 13.5 seconds (ref 11.6–15.2)

## 2010-09-12 LAB — URINE CULTURE
Colony Count: 55000
Colony Count: >=100000

## 2010-09-12 LAB — PREPARE FRESH FROZEN PLASMA

## 2010-09-12 LAB — POCT I-STAT 4, (NA,K, GLUC, HGB,HCT)
Glucose, Bld: 100 mg/dL — ABNORMAL HIGH (ref 70–99)
Glucose, Bld: 103 mg/dL — ABNORMAL HIGH (ref 70–99)
Glucose, Bld: 104 mg/dL — ABNORMAL HIGH (ref 70–99)
Glucose, Bld: 105 mg/dL — ABNORMAL HIGH (ref 70–99)
Glucose, Bld: 106 mg/dL — ABNORMAL HIGH (ref 70–99)
Glucose, Bld: 106 mg/dL — ABNORMAL HIGH (ref 70–99)
Glucose, Bld: 111 mg/dL — ABNORMAL HIGH (ref 70–99)
Glucose, Bld: 114 mg/dL — ABNORMAL HIGH (ref 70–99)
Glucose, Bld: 120 mg/dL — ABNORMAL HIGH (ref 70–99)
Glucose, Bld: 123 mg/dL — ABNORMAL HIGH (ref 70–99)
HCT: 20 % — ABNORMAL LOW (ref 36.0–46.0)
HCT: 20 % — ABNORMAL LOW (ref 36.0–46.0)
HCT: 23 % — ABNORMAL LOW (ref 36.0–46.0)
HCT: 23 % — ABNORMAL LOW (ref 36.0–46.0)
HCT: 25 % — ABNORMAL LOW (ref 36.0–46.0)
HCT: 25 % — ABNORMAL LOW (ref 36.0–46.0)
HCT: 26 % — ABNORMAL LOW (ref 36.0–46.0)
HCT: 28 % — ABNORMAL LOW (ref 36.0–46.0)
HCT: 32 % — ABNORMAL LOW (ref 36.0–46.0)
HCT: 35 % — ABNORMAL LOW (ref 36.0–46.0)
Hemoglobin: 10.9 g/dL — ABNORMAL LOW (ref 12.0–15.0)
Hemoglobin: 11.9 g/dL — ABNORMAL LOW (ref 12.0–15.0)
Hemoglobin: 6.8 g/dL — CL (ref 12.0–15.0)
Hemoglobin: 6.8 g/dL — CL (ref 12.0–15.0)
Hemoglobin: 7.8 g/dL — CL (ref 12.0–15.0)
Hemoglobin: 7.8 g/dL — CL (ref 12.0–15.0)
Hemoglobin: 8.5 g/dL — ABNORMAL LOW (ref 12.0–15.0)
Hemoglobin: 8.5 g/dL — ABNORMAL LOW (ref 12.0–15.0)
Hemoglobin: 8.8 g/dL — ABNORMAL LOW (ref 12.0–15.0)
Hemoglobin: 9.5 g/dL — ABNORMAL LOW (ref 12.0–15.0)
Potassium: 3.1 mEq/L — ABNORMAL LOW (ref 3.5–5.1)
Potassium: 3.7 mEq/L (ref 3.5–5.1)
Potassium: 3.8 mEq/L (ref 3.5–5.1)
Potassium: 3.9 mEq/L (ref 3.5–5.1)
Potassium: 4.1 mEq/L (ref 3.5–5.1)
Potassium: 4.7 mEq/L (ref 3.5–5.1)
Potassium: 5.1 mEq/L (ref 3.5–5.1)
Potassium: 5.6 mEq/L — ABNORMAL HIGH (ref 3.5–5.1)
Potassium: 5.9 mEq/L — ABNORMAL HIGH (ref 3.5–5.1)
Potassium: 6 mEq/L — ABNORMAL HIGH (ref 3.5–5.1)
Sodium: 131 mEq/L — ABNORMAL LOW (ref 135–145)
Sodium: 136 mEq/L (ref 135–145)
Sodium: 138 mEq/L (ref 135–145)
Sodium: 139 mEq/L (ref 135–145)
Sodium: 139 mEq/L (ref 135–145)
Sodium: 141 mEq/L (ref 135–145)
Sodium: 141 mEq/L (ref 135–145)
Sodium: 144 mEq/L (ref 135–145)
Sodium: 145 mEq/L (ref 135–145)
Sodium: 147 mEq/L — ABNORMAL HIGH (ref 135–145)

## 2010-09-12 LAB — CBC
HCT: 15.9 % — ABNORMAL LOW (ref 36.0–46.0)
HCT: 22.3 % — ABNORMAL LOW (ref 36.0–46.0)
HCT: 23.3 % — ABNORMAL LOW (ref 36.0–46.0)
HCT: 25.9 % — ABNORMAL LOW (ref 36.0–46.0)
HCT: 28 % — ABNORMAL LOW (ref 36.0–46.0)
HCT: 29.8 % — ABNORMAL LOW (ref 36.0–46.0)
HCT: 31.1 % — ABNORMAL LOW (ref 36.0–46.0)
HCT: 31.3 % — ABNORMAL LOW (ref 36.0–46.0)
HCT: 31.5 % — ABNORMAL LOW (ref 36.0–46.0)
HCT: 34.5 % — ABNORMAL LOW (ref 36.0–46.0)
HCT: 37.8 % (ref 36.0–46.0)
HCT: 38.5 % (ref 36.0–46.0)
HCT: 39.8 % (ref 36.0–46.0)
HCT: 39.9 % (ref 36.0–46.0)
HCT: 40.2 % (ref 36.0–46.0)
Hemoglobin: 10.5 g/dL — ABNORMAL LOW (ref 12.0–15.0)
Hemoglobin: 11 g/dL — ABNORMAL LOW (ref 12.0–15.0)
Hemoglobin: 11 g/dL — ABNORMAL LOW (ref 12.0–15.0)
Hemoglobin: 11 g/dL — ABNORMAL LOW (ref 12.0–15.0)
Hemoglobin: 12.2 g/dL (ref 12.0–15.0)
Hemoglobin: 13.1 g/dL (ref 12.0–15.0)
Hemoglobin: 13.4 g/dL (ref 12.0–15.0)
Hemoglobin: 13.6 g/dL (ref 12.0–15.0)
Hemoglobin: 13.7 g/dL (ref 12.0–15.0)
Hemoglobin: 13.9 g/dL (ref 12.0–15.0)
Hemoglobin: 5.6 g/dL — CL (ref 12.0–15.0)
Hemoglobin: 7.9 g/dL — CL (ref 12.0–15.0)
Hemoglobin: 8.2 g/dL — ABNORMAL LOW (ref 12.0–15.0)
Hemoglobin: 9.3 g/dL — ABNORMAL LOW (ref 12.0–15.0)
Hemoglobin: 9.9 g/dL — ABNORMAL LOW (ref 12.0–15.0)
MCHC: 34.2 g/dL (ref 30.0–36.0)
MCHC: 34.5 g/dL (ref 30.0–36.0)
MCHC: 34.5 g/dL (ref 30.0–36.0)
MCHC: 34.8 g/dL (ref 30.0–36.0)
MCHC: 35 g/dL (ref 30.0–36.0)
MCHC: 35 g/dL (ref 30.0–36.0)
MCHC: 35.1 g/dL (ref 30.0–36.0)
MCHC: 35.1 g/dL (ref 30.0–36.0)
MCHC: 35.3 g/dL (ref 30.0–36.0)
MCHC: 35.3 g/dL (ref 30.0–36.0)
MCHC: 35.4 g/dL (ref 30.0–36.0)
MCHC: 35.4 g/dL (ref 30.0–36.0)
MCHC: 35.4 g/dL (ref 30.0–36.0)
MCHC: 35.5 g/dL (ref 30.0–36.0)
MCHC: 35.8 g/dL (ref 30.0–36.0)
MCV: 87.3 fL (ref 78.0–100.0)
MCV: 87.4 fL (ref 78.0–100.0)
MCV: 87.5 fL (ref 78.0–100.0)
MCV: 88.9 fL (ref 78.0–100.0)
MCV: 89 fL (ref 78.0–100.0)
MCV: 89 fL (ref 78.0–100.0)
MCV: 89.2 fL (ref 78.0–100.0)
MCV: 89.6 fL (ref 78.0–100.0)
MCV: 89.6 fL (ref 78.0–100.0)
MCV: 90.4 fL (ref 78.0–100.0)
MCV: 90.8 fL (ref 78.0–100.0)
MCV: 90.9 fL (ref 78.0–100.0)
MCV: 91.4 fL (ref 78.0–100.0)
MCV: 91.5 fL (ref 78.0–100.0)
MCV: 92.2 fL (ref 78.0–100.0)
Platelets: 110 10*3/uL — ABNORMAL LOW (ref 150–400)
Platelets: 113 10*3/uL — ABNORMAL LOW (ref 150–400)
Platelets: 119 10*3/uL — ABNORMAL LOW (ref 150–400)
Platelets: 123 10*3/uL — ABNORMAL LOW (ref 150–400)
Platelets: 126 10*3/uL — ABNORMAL LOW (ref 150–400)
Platelets: 127 10*3/uL — ABNORMAL LOW (ref 150–400)
Platelets: 132 10*3/uL — ABNORMAL LOW (ref 150–400)
Platelets: 152 10*3/uL (ref 150–400)
Platelets: 174 10*3/uL (ref 150–400)
Platelets: 191 10*3/uL (ref 150–400)
Platelets: 207 10*3/uL (ref 150–400)
Platelets: 208 10*3/uL (ref 150–400)
Platelets: 210 10*3/uL (ref 150–400)
Platelets: 227 10*3/uL (ref 150–400)
Platelets: 293 10*3/uL (ref 150–400)
RBC: 1.77 MIL/uL — ABNORMAL LOW (ref 3.87–5.11)
RBC: 2.5 MIL/uL — ABNORMAL LOW (ref 3.87–5.11)
RBC: 2.62 MIL/uL — ABNORMAL LOW (ref 3.87–5.11)
RBC: 2.96 MIL/uL — ABNORMAL LOW (ref 3.87–5.11)
RBC: 3.2 MIL/uL — ABNORMAL LOW (ref 3.87–5.11)
RBC: 3.41 MIL/uL — ABNORMAL LOW (ref 3.87–5.11)
RBC: 3.48 MIL/uL — ABNORMAL LOW (ref 3.87–5.11)
RBC: 3.49 MIL/uL — ABNORMAL LOW (ref 3.87–5.11)
RBC: 3.5 MIL/uL — ABNORMAL LOW (ref 3.87–5.11)
RBC: 3.88 MIL/uL (ref 3.87–5.11)
RBC: 4.15 MIL/uL (ref 3.87–5.11)
RBC: 4.23 MIL/uL (ref 3.87–5.11)
RBC: 4.32 MIL/uL (ref 3.87–5.11)
RBC: 4.36 MIL/uL (ref 3.87–5.11)
RBC: 4.4 MIL/uL (ref 3.87–5.11)
RDW: 12.7 % (ref 11.5–15.5)
RDW: 12.8 % (ref 11.5–15.5)
RDW: 13 % (ref 11.5–15.5)
RDW: 13.2 % (ref 11.5–15.5)
RDW: 13.3 % (ref 11.5–15.5)
RDW: 13.4 % (ref 11.5–15.5)
RDW: 13.8 % (ref 11.5–15.5)
RDW: 14 % (ref 11.5–15.5)
RDW: 14 % (ref 11.5–15.5)
RDW: 14.1 % (ref 11.5–15.5)
RDW: 14.1 % (ref 11.5–15.5)
RDW: 14.5 % (ref 11.5–15.5)
RDW: 14.5 % (ref 11.5–15.5)
RDW: 14.7 % (ref 11.5–15.5)
RDW: 15 % (ref 11.5–15.5)
WBC: 10.1 10*3/uL (ref 4.0–10.5)
WBC: 12.5 10*3/uL — ABNORMAL HIGH (ref 4.0–10.5)
WBC: 13.5 10*3/uL — ABNORMAL HIGH (ref 4.0–10.5)
WBC: 14 10*3/uL — ABNORMAL HIGH (ref 4.0–10.5)
WBC: 14.2 10*3/uL — ABNORMAL HIGH (ref 4.0–10.5)
WBC: 14.4 10*3/uL — ABNORMAL HIGH (ref 4.0–10.5)
WBC: 6 10*3/uL (ref 4.0–10.5)
WBC: 6.6 10*3/uL (ref 4.0–10.5)
WBC: 7 10*3/uL (ref 4.0–10.5)
WBC: 7 10*3/uL (ref 4.0–10.5)
WBC: 7.3 10*3/uL (ref 4.0–10.5)
WBC: 7.6 10*3/uL (ref 4.0–10.5)
WBC: 8 10*3/uL (ref 4.0–10.5)
WBC: 8.7 10*3/uL (ref 4.0–10.5)
WBC: 9.1 10*3/uL (ref 4.0–10.5)

## 2010-09-12 LAB — HEMOGLOBIN AND HEMATOCRIT, BLOOD
HCT: 25.3 % — ABNORMAL LOW (ref 36.0–46.0)
Hemoglobin: 8.6 g/dL — ABNORMAL LOW (ref 12.0–15.0)

## 2010-09-12 LAB — URINE MICROSCOPIC-ADD ON

## 2010-09-12 LAB — POCT I-STAT 3, ART BLOOD GAS (G3+)
Acid-Base Excess: 3 mmol/L — ABNORMAL HIGH (ref 0.0–2.0)
Acid-Base Excess: 4 mmol/L — ABNORMAL HIGH (ref 0.0–2.0)
Acid-base deficit: 1 mmol/L (ref 0.0–2.0)
Acid-base deficit: 1 mmol/L (ref 0.0–2.0)
Acid-base deficit: 3 mmol/L — ABNORMAL HIGH (ref 0.0–2.0)
Bicarbonate: 20.3 mEq/L (ref 20.0–24.0)
Bicarbonate: 22.4 mEq/L (ref 20.0–24.0)
Bicarbonate: 23.1 mEq/L (ref 20.0–24.0)
Bicarbonate: 23.6 mEq/L (ref 20.0–24.0)
Bicarbonate: 23.9 mEq/L (ref 20.0–24.0)
Bicarbonate: 24.1 mEq/L — ABNORMAL HIGH (ref 20.0–24.0)
Bicarbonate: 28.1 mEq/L — ABNORMAL HIGH (ref 20.0–24.0)
Bicarbonate: 29.1 mEq/L — ABNORMAL HIGH (ref 20.0–24.0)
O2 Saturation: 100 %
O2 Saturation: 100 %
O2 Saturation: 100 %
O2 Saturation: 100 %
O2 Saturation: 95 %
O2 Saturation: 96 %
O2 Saturation: 98 %
O2 Saturation: 99 %
Patient temperature: 34.5
Patient temperature: 34.8
Patient temperature: 37.1
Patient temperature: 37.2
TCO2: 21 mmol/L (ref 0–100)
TCO2: 23 mmol/L (ref 0–100)
TCO2: 24 mmol/L (ref 0–100)
TCO2: 25 mmol/L (ref 0–100)
TCO2: 25 mmol/L (ref 0–100)
TCO2: 25 mmol/L (ref 0–100)
TCO2: 29 mmol/L (ref 0–100)
TCO2: 30 mmol/L (ref 0–100)
pCO2 arterial: 24.4 mmHg — ABNORMAL LOW (ref 35.0–45.0)
pCO2 arterial: 27 mmHg — ABNORMAL LOW (ref 35.0–45.0)
pCO2 arterial: 32 mmHg — ABNORMAL LOW (ref 35.0–45.0)
pCO2 arterial: 35.3 mmHg (ref 35.0–45.0)
pCO2 arterial: 35.7 mmHg (ref 35.0–45.0)
pCO2 arterial: 36.1 mmHg (ref 35.0–45.0)
pCO2 arterial: 43.2 mmHg (ref 35.0–45.0)
pCO2 arterial: 43.6 mmHg (ref 35.0–45.0)
pH, Arterial: 7.418 — ABNORMAL HIGH (ref 7.350–7.400)
pH, Arterial: 7.426 — ABNORMAL HIGH (ref 7.350–7.400)
pH, Arterial: 7.433 — ABNORMAL HIGH (ref 7.350–7.400)
pH, Arterial: 7.436 — ABNORMAL HIGH (ref 7.350–7.400)
pH, Arterial: 7.442 — ABNORMAL HIGH (ref 7.350–7.400)
pH, Arterial: 7.452 — ABNORMAL HIGH (ref 7.350–7.400)
pH, Arterial: 7.518 — ABNORMAL HIGH (ref 7.350–7.400)
pH, Arterial: 7.533 — ABNORMAL HIGH (ref 7.350–7.400)
pO2, Arterial: 119 mmHg — ABNORMAL HIGH (ref 80.0–100.0)
pO2, Arterial: 233 mmHg — ABNORMAL HIGH (ref 80.0–100.0)
pO2, Arterial: 306 mmHg — ABNORMAL HIGH (ref 80.0–100.0)
pO2, Arterial: 353 mmHg — ABNORMAL HIGH (ref 80.0–100.0)
pO2, Arterial: 477 mmHg — ABNORMAL HIGH (ref 80.0–100.0)
pO2, Arterial: 73 mmHg — ABNORMAL LOW (ref 80.0–100.0)
pO2, Arterial: 80 mmHg (ref 80.0–100.0)
pO2, Arterial: 84 mmHg (ref 80.0–100.0)

## 2010-09-12 LAB — GLUCOSE, CAPILLARY
Glucose-Capillary: 100 mg/dL — ABNORMAL HIGH (ref 70–99)
Glucose-Capillary: 101 mg/dL — ABNORMAL HIGH (ref 70–99)
Glucose-Capillary: 104 mg/dL — ABNORMAL HIGH (ref 70–99)
Glucose-Capillary: 104 mg/dL — ABNORMAL HIGH (ref 70–99)
Glucose-Capillary: 112 mg/dL — ABNORMAL HIGH (ref 70–99)
Glucose-Capillary: 115 mg/dL — ABNORMAL HIGH (ref 70–99)
Glucose-Capillary: 116 mg/dL — ABNORMAL HIGH (ref 70–99)
Glucose-Capillary: 120 mg/dL — ABNORMAL HIGH (ref 70–99)
Glucose-Capillary: 123 mg/dL — ABNORMAL HIGH (ref 70–99)
Glucose-Capillary: 124 mg/dL — ABNORMAL HIGH (ref 70–99)
Glucose-Capillary: 127 mg/dL — ABNORMAL HIGH (ref 70–99)
Glucose-Capillary: 129 mg/dL — ABNORMAL HIGH (ref 70–99)
Glucose-Capillary: 135 mg/dL — ABNORMAL HIGH (ref 70–99)
Glucose-Capillary: 136 mg/dL — ABNORMAL HIGH (ref 70–99)
Glucose-Capillary: 140 mg/dL — ABNORMAL HIGH (ref 70–99)
Glucose-Capillary: 141 mg/dL — ABNORMAL HIGH (ref 70–99)
Glucose-Capillary: 142 mg/dL — ABNORMAL HIGH (ref 70–99)
Glucose-Capillary: 144 mg/dL — ABNORMAL HIGH (ref 70–99)
Glucose-Capillary: 166 mg/dL — ABNORMAL HIGH (ref 70–99)
Glucose-Capillary: 92 mg/dL (ref 70–99)

## 2010-09-12 LAB — LIPID PANEL
Cholesterol: 102 mg/dL (ref 0–200)
HDL: 33 mg/dL — ABNORMAL LOW (ref >39)
LDL Cholesterol: 51 mg/dL (ref 0–99)
Total CHOL/HDL Ratio: 3.1 RATIO
Triglycerides: 90 mg/dL (ref <150)
VLDL: 18 mg/dL (ref 0–40)

## 2010-09-12 LAB — URINALYSIS, ROUTINE W REFLEX MICROSCOPIC
Bilirubin Urine: NEGATIVE
Glucose, UA: NEGATIVE mg/dL
Glucose, UA: NEGATIVE mg/dL
Glucose, UA: NEGATIVE mg/dL
Hgb urine dipstick: NEGATIVE
Hgb urine dipstick: NEGATIVE
Ketones, ur: 15 mg/dL — AB
Ketones, ur: NEGATIVE mg/dL
Ketones, ur: NEGATIVE mg/dL
Nitrite: NEGATIVE
Nitrite: NEGATIVE
Nitrite: POSITIVE — AB
Protein, ur: 30 mg/dL — AB
Protein, ur: NEGATIVE mg/dL
Protein, ur: NEGATIVE mg/dL
Specific Gravity, Urine: 1.012 (ref 1.005–1.030)
Specific Gravity, Urine: 1.022 (ref 1.005–1.030)
Specific Gravity, Urine: 1.031 — ABNORMAL HIGH (ref 1.005–1.030)
Urobilinogen, UA: 1 mg/dL (ref 0.0–1.0)
Urobilinogen, UA: 1 mg/dL (ref 0.0–1.0)
Urobilinogen, UA: 4 mg/dL — ABNORMAL HIGH (ref 0.0–1.0)
pH: 5.5 (ref 5.0–8.0)
pH: 6 (ref 5.0–8.0)
pH: 6.5 (ref 5.0–8.0)

## 2010-09-12 LAB — BASIC METABOLIC PANEL
BUN: 10 mg/dL (ref 6–23)
BUN: 11 mg/dL (ref 6–23)
BUN: 12 mg/dL (ref 6–23)
BUN: 12 mg/dL (ref 6–23)
BUN: 12 mg/dL (ref 6–23)
BUN: 14 mg/dL (ref 6–23)
BUN: 16 mg/dL (ref 6–23)
BUN: 8 mg/dL (ref 6–23)
CO2: 25 mEq/L (ref 19–32)
CO2: 25 mEq/L (ref 19–32)
CO2: 26 mEq/L (ref 19–32)
CO2: 28 mEq/L (ref 19–32)
CO2: 28 mEq/L (ref 19–32)
CO2: 28 mEq/L (ref 19–32)
CO2: 29 mEq/L (ref 19–32)
CO2: 31 mEq/L (ref 19–32)
Calcium: 7.8 mg/dL — ABNORMAL LOW (ref 8.4–10.5)
Calcium: 8 mg/dL — ABNORMAL LOW (ref 8.4–10.5)
Calcium: 8.3 mg/dL — ABNORMAL LOW (ref 8.4–10.5)
Calcium: 8.4 mg/dL (ref 8.4–10.5)
Calcium: 8.5 mg/dL (ref 8.4–10.5)
Calcium: 8.5 mg/dL (ref 8.4–10.5)
Calcium: 9 mg/dL (ref 8.4–10.5)
Calcium: 9.3 mg/dL (ref 8.4–10.5)
Chloride: 100 mEq/L (ref 96–112)
Chloride: 102 mEq/L (ref 96–112)
Chloride: 103 mEq/L (ref 96–112)
Chloride: 106 mEq/L (ref 96–112)
Chloride: 110 mEq/L (ref 96–112)
Chloride: 97 mEq/L (ref 96–112)
Chloride: 98 mEq/L (ref 96–112)
Chloride: 99 mEq/L (ref 96–112)
Creatinine, Ser: 0.74 mg/dL (ref 0.4–1.2)
Creatinine, Ser: 0.77 mg/dL (ref 0.4–1.2)
Creatinine, Ser: 0.83 mg/dL (ref 0.4–1.2)
Creatinine, Ser: 0.83 mg/dL (ref 0.4–1.2)
Creatinine, Ser: 0.87 mg/dL (ref 0.4–1.2)
Creatinine, Ser: 0.9 mg/dL (ref 0.4–1.2)
Creatinine, Ser: 0.94 mg/dL (ref 0.4–1.2)
Creatinine, Ser: 0.98 mg/dL (ref 0.4–1.2)
GFR calc Af Amer: >60 mL/min (ref >60)
GFR calc Af Amer: >60 mL/min (ref >60)
GFR calc Af Amer: >60 mL/min (ref >60)
GFR calc Af Amer: >60 mL/min (ref >60)
GFR calc Af Amer: >60 mL/min (ref >60)
GFR calc Af Amer: >60 mL/min (ref >60)
GFR calc Af Amer: >60 mL/min (ref >60)
GFR calc Af Amer: >60 mL/min (ref >60)
GFR calc non Af Amer: 55 mL/min — ABNORMAL LOW (ref >60)
GFR calc non Af Amer: 58 mL/min — ABNORMAL LOW (ref >60)
GFR calc non Af Amer: >60 mL/min (ref >60)
GFR calc non Af Amer: >60 mL/min (ref >60)
GFR calc non Af Amer: >60 mL/min (ref >60)
GFR calc non Af Amer: >60 mL/min (ref >60)
GFR calc non Af Amer: >60 mL/min (ref >60)
GFR calc non Af Amer: >60 mL/min (ref >60)
Glucose, Bld: 107 mg/dL — ABNORMAL HIGH (ref 70–99)
Glucose, Bld: 109 mg/dL — ABNORMAL HIGH (ref 70–99)
Glucose, Bld: 116 mg/dL — ABNORMAL HIGH (ref 70–99)
Glucose, Bld: 117 mg/dL — ABNORMAL HIGH (ref 70–99)
Glucose, Bld: 122 mg/dL — ABNORMAL HIGH (ref 70–99)
Glucose, Bld: 147 mg/dL — ABNORMAL HIGH (ref 70–99)
Glucose, Bld: 159 mg/dL — ABNORMAL HIGH (ref 70–99)
Glucose, Bld: 94 mg/dL (ref 70–99)
Potassium: 3.1 mEq/L — ABNORMAL LOW (ref 3.5–5.1)
Potassium: 3.4 mEq/L — ABNORMAL LOW (ref 3.5–5.1)
Potassium: 3.6 mEq/L (ref 3.5–5.1)
Potassium: 3.6 mEq/L (ref 3.5–5.1)
Potassium: 3.7 mEq/L (ref 3.5–5.1)
Potassium: 3.9 mEq/L (ref 3.5–5.1)
Potassium: 4 mEq/L (ref 3.5–5.1)
Potassium: 4.3 mEq/L (ref 3.5–5.1)
Sodium: 133 mEq/L — ABNORMAL LOW (ref 135–145)
Sodium: 133 mEq/L — ABNORMAL LOW (ref 135–145)
Sodium: 136 mEq/L (ref 135–145)
Sodium: 136 mEq/L (ref 135–145)
Sodium: 136 mEq/L (ref 135–145)
Sodium: 137 mEq/L (ref 135–145)
Sodium: 138 mEq/L (ref 135–145)
Sodium: 140 mEq/L (ref 135–145)

## 2010-09-12 LAB — POCT I-STAT, CHEM 8
BUN: 12 mg/dL (ref 6–23)
BUN: 6 mg/dL (ref 6–23)
BUN: 7 mg/dL (ref 6–23)
BUN: 7 mg/dL (ref 6–23)
Calcium, Ion: 1.05 mmol/L — ABNORMAL LOW (ref 1.12–1.32)
Calcium, Ion: 1.1 mmol/L — ABNORMAL LOW (ref 1.12–1.32)
Calcium, Ion: 1.13 mmol/L (ref 1.12–1.32)
Calcium, Ion: 1.16 mmol/L (ref 1.12–1.32)
Chloride: 102 mEq/L (ref 96–112)
Chloride: 107 mEq/L (ref 96–112)
Chloride: 109 mEq/L (ref 96–112)
Chloride: 110 mEq/L (ref 96–112)
Creatinine, Ser: 0.8 mg/dL (ref 0.4–1.2)
Creatinine, Ser: 0.9 mg/dL (ref 0.4–1.2)
Creatinine, Ser: 0.9 mg/dL (ref 0.4–1.2)
Creatinine, Ser: 1 mg/dL (ref 0.4–1.2)
Glucose, Bld: 130 mg/dL — ABNORMAL HIGH (ref 70–99)
Glucose, Bld: 135 mg/dL — ABNORMAL HIGH (ref 70–99)
Glucose, Bld: 139 mg/dL — ABNORMAL HIGH (ref 70–99)
Glucose, Bld: 146 mg/dL — ABNORMAL HIGH (ref 70–99)
HCT: 14 % — ABNORMAL LOW (ref 36.0–46.0)
HCT: 15 % — ABNORMAL LOW (ref 36.0–46.0)
HCT: 21 % — ABNORMAL LOW (ref 36.0–46.0)
HCT: 29 % — ABNORMAL LOW (ref 36.0–46.0)
Hemoglobin: 4.8 g/dL — CL (ref 12.0–15.0)
Hemoglobin: 5.1 g/dL — CL (ref 12.0–15.0)
Hemoglobin: 7.1 g/dL — CL (ref 12.0–15.0)
Hemoglobin: 9.9 g/dL — ABNORMAL LOW (ref 12.0–15.0)
Potassium: 3.6 mEq/L (ref 3.5–5.1)
Potassium: 3.7 mEq/L (ref 3.5–5.1)
Potassium: 3.7 mEq/L (ref 3.5–5.1)
Potassium: 3.8 mEq/L (ref 3.5–5.1)
Sodium: 142 mEq/L (ref 135–145)
Sodium: 146 mEq/L — ABNORMAL HIGH (ref 135–145)
Sodium: 147 mEq/L — ABNORMAL HIGH (ref 135–145)
Sodium: 148 mEq/L — ABNORMAL HIGH (ref 135–145)
TCO2: 23 mmol/L (ref 0–100)
TCO2: 25 mmol/L (ref 0–100)
TCO2: 27 mmol/L (ref 0–100)
TCO2: 27 mmol/L (ref 0–100)

## 2010-09-12 LAB — CK TOTAL AND CKMB (NOT AT ARMC)
CK, MB: 1.2 ng/mL (ref 0.3–4.0)
Relative Index: INVALID (ref 0.0–2.5)
Total CK: 48 U/L (ref 7–177)

## 2010-09-12 LAB — HEPARIN LEVEL (UNFRACTIONATED)
Heparin Unfractionated: 0.36 IU/mL (ref 0.30–0.70)
Heparin Unfractionated: 0.46 IU/mL (ref 0.30–0.70)
Heparin Unfractionated: 0.5 IU/mL (ref 0.30–0.70)
Heparin Unfractionated: 0.59 IU/mL (ref 0.30–0.70)
Heparin Unfractionated: <0.1 IU/mL — ABNORMAL LOW (ref 0.30–0.70)

## 2010-09-12 LAB — TYPE AND SCREEN
ABO/RH(D): O POS
Antibody Screen: NEGATIVE

## 2010-09-12 LAB — COMPREHENSIVE METABOLIC PANEL
ALT: 20 U/L (ref 0–35)
ALT: 42 U/L — ABNORMAL HIGH (ref 0–35)
AST: 25 U/L (ref 0–37)
AST: 35 U/L (ref 0–37)
Albumin: 2.6 g/dL — ABNORMAL LOW (ref 3.5–5.2)
Albumin: 3.3 g/dL — ABNORMAL LOW (ref 3.5–5.2)
Alkaline Phosphatase: 53 U/L (ref 39–117)
Alkaline Phosphatase: 67 U/L (ref 39–117)
BUN: 10 mg/dL (ref 6–23)
BUN: 9 mg/dL (ref 6–23)
CO2: 26 mEq/L (ref 19–32)
CO2: 29 mEq/L (ref 19–32)
Calcium: 9 mg/dL (ref 8.4–10.5)
Calcium: 9.3 mg/dL (ref 8.4–10.5)
Chloride: 102 mEq/L (ref 96–112)
Chloride: 105 mEq/L (ref 96–112)
Creatinine, Ser: 0.78 mg/dL (ref 0.4–1.2)
Creatinine, Ser: 0.97 mg/dL (ref 0.4–1.2)
GFR calc Af Amer: >60 mL/min (ref >60)
GFR calc Af Amer: >60 mL/min (ref >60)
GFR calc non Af Amer: 56 mL/min — ABNORMAL LOW (ref >60)
GFR calc non Af Amer: >60 mL/min (ref >60)
Glucose, Bld: 102 mg/dL — ABNORMAL HIGH (ref 70–99)
Glucose, Bld: 105 mg/dL — ABNORMAL HIGH (ref 70–99)
Potassium: 3.9 mEq/L (ref 3.5–5.1)
Potassium: 4 mEq/L (ref 3.5–5.1)
Sodium: 137 mEq/L (ref 135–145)
Sodium: 137 mEq/L (ref 135–145)
Total Bilirubin: 0.7 mg/dL (ref 0.3–1.2)
Total Bilirubin: 1.3 mg/dL — ABNORMAL HIGH (ref 0.3–1.2)
Total Protein: 5.7 g/dL — ABNORMAL LOW (ref 6.0–8.3)
Total Protein: 6 g/dL (ref 6.0–8.3)

## 2010-09-12 LAB — POCT CARDIAC MARKERS
CKMB, poc: <1 ng/mL — ABNORMAL LOW (ref 1.0–8.0)
CKMB, poc: <1 ng/mL — ABNORMAL LOW (ref 1.0–8.0)
Myoglobin, poc: 46.3 ng/mL (ref 12–200)
Myoglobin, poc: 65 ng/mL (ref 12–200)
Troponin i, poc: <0.05 ng/mL (ref 0.00–0.09)
Troponin i, poc: <0.05 ng/mL (ref 0.00–0.09)

## 2010-09-12 LAB — DIFFERENTIAL
Basophils Absolute: 0 10*3/uL (ref 0.0–0.1)
Basophils Absolute: 0 10*3/uL (ref 0.0–0.1)
Basophils Relative: 0 % (ref 0–1)
Basophils Relative: 0 % (ref 0–1)
Eosinophils Absolute: 0.1 10*3/uL (ref 0.0–0.7)
Eosinophils Absolute: 0.2 10*3/uL (ref 0.0–0.7)
Eosinophils Relative: 2 % (ref 0–5)
Eosinophils Relative: 2 % (ref 0–5)
Lymphocytes Relative: 24 % (ref 12–46)
Lymphocytes Relative: 43 % (ref 12–46)
Lymphs Abs: 2.4 10*3/uL (ref 0.7–4.0)
Lymphs Abs: 3.3 10*3/uL (ref 0.7–4.0)
Monocytes Absolute: 0.6 10*3/uL (ref 0.1–1.0)
Monocytes Absolute: 1 10*3/uL (ref 0.1–1.0)
Monocytes Relative: 10 % (ref 3–12)
Monocytes Relative: 8 % (ref 3–12)
Neutro Abs: 3.5 10*3/uL (ref 1.7–7.7)
Neutro Abs: 6.4 10*3/uL (ref 1.7–7.7)
Neutrophils Relative %: 47 % (ref 43–77)
Neutrophils Relative %: 63 % (ref 43–77)

## 2010-09-12 LAB — POCT I-STAT 3, VENOUS BLOOD GAS (G3P V)
Acid-base deficit: 3 mmol/L — ABNORMAL HIGH (ref 0.0–2.0)
Bicarbonate: 21.2 mEq/L (ref 20.0–24.0)
O2 Saturation: 93 %
TCO2: 22 mmol/L (ref 0–100)
pCO2, Ven: 34.9 mmHg — ABNORMAL LOW (ref 45.0–50.0)
pH, Ven: 7.392 — ABNORMAL HIGH (ref 7.250–7.300)
pO2, Ven: 67 mmHg — ABNORMAL HIGH (ref 30.0–45.0)

## 2010-09-12 LAB — PROTIME-INR
INR: 1 (ref 0.00–1.49)
INR: 1.1 (ref 0.00–1.49)
INR: 1.1 (ref 0.00–1.49)
INR: 1.3 (ref 0.00–1.49)
INR: 1.5 (ref 0.00–1.49)
Prothrombin Time: 13.9 seconds (ref 11.6–15.2)
Prothrombin Time: 14.1 seconds (ref 11.6–15.2)
Prothrombin Time: 14.7 seconds (ref 11.6–15.2)
Prothrombin Time: 16.8 seconds — ABNORMAL HIGH (ref 11.6–15.2)
Prothrombin Time: 18.9 seconds — ABNORMAL HIGH (ref 11.6–15.2)

## 2010-09-12 LAB — BLOOD GAS, ARTERIAL
Acid-Base Excess: 1.2 mmol/L (ref 0.0–2.0)
Bicarbonate: 25.3 mEq/L — ABNORMAL HIGH (ref 20.0–24.0)
FIO2: 0.21 %
O2 Saturation: 97.1 %
Patient temperature: 98.6
TCO2: 26.5 mmol/L (ref 0–100)
pCO2 arterial: 40 mmHg (ref 35.0–45.0)
pH, Arterial: 7.417 — ABNORMAL HIGH (ref 7.350–7.400)
pO2, Arterial: 84.7 mmHg (ref 80.0–100.0)

## 2010-09-12 LAB — URINALYSIS, MICROSCOPIC ONLY
Glucose, UA: NEGATIVE mg/dL
Hgb urine dipstick: NEGATIVE
Ketones, ur: NEGATIVE mg/dL
Nitrite: NEGATIVE
Protein, ur: NEGATIVE mg/dL
Specific Gravity, Urine: 1.019 (ref 1.005–1.030)
Urobilinogen, UA: 2 mg/dL — ABNORMAL HIGH (ref 0.0–1.0)
pH: 6.5 (ref 5.0–8.0)

## 2010-09-12 LAB — APTT
aPTT: 28 seconds (ref 24–37)
aPTT: 34 seconds (ref 24–37)
aPTT: 43 seconds — ABNORMAL HIGH (ref 24–37)

## 2010-09-12 LAB — CREATININE, SERUM
Creatinine, Ser: 0.78 mg/dL (ref 0.4–1.2)
Creatinine, Ser: 1.01 mg/dL (ref 0.4–1.2)
GFR calc Af Amer: >60 mL/min (ref >60)
GFR calc Af Amer: >60 mL/min (ref >60)
GFR calc non Af Amer: 53 mL/min — ABNORMAL LOW (ref >60)
GFR calc non Af Amer: >60 mL/min (ref >60)

## 2010-09-12 LAB — POCT I-STAT GLUCOSE
Glucose, Bld: 110 mg/dL — ABNORMAL HIGH (ref 70–99)
Glucose, Bld: 76 mg/dL (ref 70–99)
Operator id: 140821
Operator id: 140821

## 2010-09-12 LAB — HEMOGLOBIN A1C
Hgb A1c MFr Bld: 5.6 % (ref 4.6–6.1)
Mean Plasma Glucose: 114 mg/dL

## 2010-09-12 LAB — PREPARE RBC (CROSSMATCH)

## 2010-09-12 LAB — ABO/RH: ABO/RH(D): O POS

## 2010-09-12 LAB — MAGNESIUM
Magnesium: 2 mg/dL (ref 1.5–2.5)
Magnesium: 2.2 mg/dL (ref 1.5–2.5)
Magnesium: 2.8 mg/dL — ABNORMAL HIGH (ref 1.5–2.5)

## 2010-09-12 LAB — CARDIAC PANEL(CRET KIN+CKTOT+MB+TROPI)
CK, MB: 1.1 ng/mL (ref 0.3–4.0)
CK, MB: 1.2 ng/mL (ref 0.3–4.0)
Relative Index: INVALID (ref 0.0–2.5)
Relative Index: INVALID (ref 0.0–2.5)
Total CK: 45 U/L (ref 7–177)
Total CK: 50 U/L (ref 7–177)
Troponin I: 0.01 ng/mL (ref 0.00–0.06)
Troponin I: 0.01 ng/mL (ref 0.00–0.06)

## 2010-09-12 LAB — BRAIN NATRIURETIC PEPTIDE: Pro B Natriuretic peptide (BNP): 516 pg/mL — ABNORMAL HIGH (ref 0.0–100.0)

## 2010-09-12 LAB — PLATELET COUNT: Platelets: 120 10*3/uL — ABNORMAL LOW (ref 150–400)

## 2010-09-12 LAB — VITAMIN D 1,25 DIHYDROXY
Vitamin D 1, 25 (OH)2 Total: 35 pg/mL (ref 18–72)
Vitamin D2 1, 25 (OH)2: 9 pg/mL
Vitamin D3 1, 25 (OH)2: 26 pg/mL

## 2010-09-12 LAB — TROPONIN I: Troponin I: <0.01 ng/mL (ref 0.00–0.06)

## 2010-09-12 LAB — HOMOCYSTEINE: Homocysteine: 8.7 umol/L (ref 4.0–15.4)

## 2010-09-12 LAB — PREALBUMIN: Prealbumin: 11.2 mg/dL — ABNORMAL LOW (ref 18.0–45.0)

## 2010-09-12 LAB — TSH: TSH: 2.592 u[IU]/mL (ref 0.350–4.500)

## 2010-10-10 NOTE — Cardiovascular Report (Signed)
NAMEJAMEKIA, [name] NO.:  [id number]   MEDICAL RECORD NO.:  [id number]          PATIENT TYPE:  OIB   LOCATION:  1966                         FACILITY:  [facility]   PHYSICIAN:  Vesta Mixer, M.D. DATE OF BIRTH:  [birth year]   DATE OF PROCEDURE:  09/08/2008  DATE OF DISCHARGE:  09/08/2008                            CARDIAC CATHETERIZATION   Kendyll Huettner is a 77 year old female with a history of coronary artery  bypass grafting x2.  Her most recent surgery was 2 months ago.  She has  been having lots of episodes of chest pain, feel like angina.  She had  negative cardiac enzymes.  She is referred for heart catheterization for  further evaluation.   PROCEDURE:  Left heart catheterization with coronary angiography.   The right femoral artery was easily cannulated using a modified  Seldinger technique.   HEMODYNAMIC RESULTS:  LV pressure was 116/16 with an aortic pressure of  118/54.   ANGIOGRAPHY:  Left Main:  Left main has some minor irregularities.  It  is severely calcified.  It gives off the left anterior descending  artery.  The LAD gives off 2 large septals, but then it is occluded.  There are no diagonal branch that is visible.   The left circumflex artery is a large vessel.  There is minor luminal  irregularities.  There is competitive flow from the saphenous vein graft  down to the distal obtuse marginal artery.   The right coronary artery is occluded.   The saphenous vein graft to the obtuse marginal artery has some  irregularities.  Most of these irregularities seem to occlude valve  which caused turbulence in the flow.  There is no distinct stenosis,  although this graft is not is as smooth as other new grafts are.   The free right internal mammary artery originates off the hood of the  saphenous vein graft.  It is a nice graft all the way down to the distal  left anterior descending artery.  Flow was good.  The anastomosis was  nice.   The  saphenous vein graft to the right coronary artery is smooth and  normal.  Posterior descending artery and distal right coronary artery is  normal.  There is competitive flow from the left system collaterals.   The left ventriculogram was performed in the 30 RAO position.  It  reveals hypokinesis of the anterior wall with akinesis of the apex.  This certainly improved from her last left ventriculogram performed  several months ago prior to her redo bypass grafting.  Her ejection  fraction is probably 40-45%.   We will continue with medical therapy.  She is stable leaving the lab.      Vesta Mixer, M.D.  Electronically Signed     PJN/MEDQ  D:  09/08/2008  T:  09/09/2008  Job:  [id number]   cc:   Seymour Bars, D.O.  Teena Irani. Arlyce Dice, M.D.

## 2010-10-10 NOTE — Op Note (Signed)
[name], [name]                   ACCOUNT NO.:  [id number]   MEDICAL RECORD NO.:  [id number]          PATIENT TYPE:  AMB   LOCATION:  SDS                          FACILITY:  [facility]   PHYSICIAN:  Salvatore Decent. Cornelius Moras, M.D. DATE OF BIRTH:  08/08/1928   DATE OF PROCEDURE:  07/06/2008  DATE OF DISCHARGE:                               OPERATIVE REPORT   PREOPERATIVE DIAGNOSIS:  Severe three-vessel coronary artery disease  with vein graft disease status post coronary artery bypass grafting in  1997.   POSTOPERATIVE DIAGNOSIS:  Severe three-vessel coronary artery disease  with vein graft disease status post coronary artery bypass grafting in  1997.   PROCEDURE:  Redo median sternotomy for redo coronary artery bypass  grafting x3 (free right internal mammary artery to distal left anterior  descending coronary artery, saphenous vein graft to circumflex marginal  branch, saphenous vein graft to left posterolateral branch, endoscopic  saphenous vein harvest from left thigh and left lower leg).   SURGEON:  Salvatore Decent. Cornelius Moras, MD   ASSISTANT:  Rowe Clack, PA-C   ANESTHESIA:  General.   BRIEF CLINICAL NOTE:  The patient is a 77 year old female from  [place] with known history of coronary artery disease,  hyperlipidemia, peripheral vascular disease, and degenerative arthritis.  The patient underwent coronary artery bypass grafting x5 by Dr. Laneta Simmers  in 1997.  The patient initially did well for quite some time.  In 2006,  repeat catheterization revealed that the left internal mammary artery  graft was atretic and nonfunctional.  At that time, all the vein grafts  were functioning and the distal anterior wall was supplied via the  diagonal branch vein graft.  The patient now returns with unstable  angina.  Cardiac catheterization performed in January by Dr. Elease Hashimoto,  demonstrates progression of vein graft disease such that 2/3 previously  placed vein grafts were chronically occluded, and the  third vein graft  to the circumflex marginal branch was severely diseased.  There was  progression of native three-vessel coronary artery disease.  There was  evidence for probable recent anterior wall myocardial infarction.  Left  ventricular function was moderately reduced.  The patient was initially  seen in consultation by Dr. Laneta Simmers and scheduled for surgery in March.  However, since then the patient has been admitted to the hospital with  unstable angina.  The patient stabilized medically and did not rule in  for another myocardial infarction.  The patient and her family have been  counseled regarding the indications, risks, and potential benefits of  surgery.  Alternative to treatment strategies have been discussed.  They  understand and accept all potential associated risks and desired to  proceed with surgery as described.   OPERATIVE FINDINGS:  1. Mild left ventricular dysfunction with ejection fraction estimated      greater than 50%.  2. Small-caliber but otherwise good-quality right internal mammary      artery conduit for grafting.  3. Fair-to-good quality saphenous vein conduit for grafting.  4. Small-caliber but satisfactory target vessels for grafting.   OPERATIVE DETAILS:  The  patient was brought to the operating room on the  above-mentioned date and central monitoring was established by the  Anesthesia team under the care and direction of Dr. Hart Robinsons.  Specifically, a Swan-Ganz catheter was placed through the right internal  jugular approach.  A radial arterial line was placed.  Intravenous  antibiotics were administered.  Following induction with general  endotracheal anesthesia, a Foley catheter was placed.  The patient's  chest, left upper extremity, abdomen, both groins, and both lower  extremities are all prepared and draped in sterile manner.   Baseline transesophageal echocardiogram was performed by Dr. Gelene Mink.  This demonstrates mild left  ventricular dysfunction.  There was mild  mitral regurgitation.  No other significant abnormalities were noted.   The greater saphenous vein was removed from the patient's left thigh and  the upper portion of the left lower leg using endoscopic vein harvest  technique through a small incision made just below the left knee.  Saphenous vein was slightly delicate, but fair-to-good quality conduit  for grafting.  After the saphenous vein had been removed, the small  incisions in the left lower extremity are closed in multiple layers with  running absorbable suture.   Redo median sternotomy incision was performed.  The previous sternal  wires were removed.  The sternum was divided with an oscillating saw.  Sternal reentry was uncomplicated.  Sharp dissection was utilized to  free up the undersurface of the sternum on both sides.  Of note, the  anterior surface of both, the left and right lung are densely adherent  to the anterior chest wall.  These were freed up with tedious sharp  dissection and electrocautery.   The right internal mammary artery was dissected from chest wall and  prepared for bypass grafting.  The right internal mammary artery was  somewhat small-caliber but otherwise good quality conduit.  After  systemic heparinization, the right internal mammary artery was  transected distally and noted to have excellent flow.  The proximal end  of the right internal mammary artery was then transected just after its  takeoff from the subclavian artery, and the proximal stump was oversewn  with suture ligatures.   Dissection was now directed to the anterior mediastinum.  The ascending  aorta was identified and dissected cephalad just beyond the level of the  innominate vein.  The aorta was moderately sclerotic.  The aorta was  cannulated uneventfully at the level just beyond the takeoff of the  innominate artery.   A small stab incision was made in the right groin.  The right  common  femoral vein was cannulated with a Seldinger technique and a long  flexible guidewire was then advanced up through the femoral vein, the  inferior vena cava, through the right atrium into the superior vena cava  using transesophageal echocardiogram for guidance.  The femoral vein was  then dilated with serial dilators, and a 22-French long femoral venous  cannula was advanced over the guidewire through the inferior vena cava,  through the right atrium until the tip extends into the superior vena  cava.   Cardiopulmonary bypass was begun.  Vacuum assist venous drainage was  utilized.  Sharp dissection was now continued to free up the right  atrium from the pericardium.  The previous occluded vein graft to the  right coronary artery was transected to facilitate exposure.  The entire  ascending aorta was dissected away from associated structures.  A 20-  French right angle metal-tipped venous  cannula was then placed directly  in the superior vena cava to supplement venous drainage.  A retrograde  cardioplegic cannula was placed through the right atrium into the  coronary sinus.  An antegrade cardioplegic cannula was placed directly  in the ascending aorta.   The patient was cooled to 32 degrees systemic temperature.  The aortic  cross-clamp was applied and cold blood cardioplegia was delivered  initially in an antegrade fashion through the aortic root.  Supplemental  cardioplegia was administered retrograde through the coronary sinus  catheter.  The initial cardioplegic arrest was rapid with excellent  diastolic arrest.  Repeat doses of cardioplegia were administered  intermittently throughout the cross-clamp portion of the operation  antegrade through the aortic root, antegrade through the subsequently  placed vein grafts, and retrograde through the coronary sinus catheter  to maintain completely isoelectric electrocardiogram and left  ventricular septal temperature of 15 degrees  centigrade.   Sharp dissection was now utilized to free up the heart from the  pericardium circumferentially.  The majority of the dissection was all  done under the cross-clamp.  There were dense adhesions, but ultimately  the heart was freed up circumferentially.  Distal target vessels were  selected for coronary bypass grafting.  Of note, the distal right  coronary artery was quite small, diffusely diseased, and not felt to be  suitable target vessel for grafting.  The patient has codominant  coronary circulation, and the posterolateral branch of the distal left  circumflex coronary artery was really a dominant arterial branch  supplying the inferior wall of the left ventricle.   The following distal coronary anastomoses were performed:  1. The posterolateral branch of the distal left circumflex coronary      artery was grafted with saphenous vein graft in an end-to-side      fashion.  This vessel measures 1.7 mm in diameter and was a good-      quality target vessel for grafting.  2. The circumflex marginal branch was grafted with a saphenous vein      graft in an end-to-side fashion.  This distal anastomoses was      constructed at the site of the distal anastomoses of the old vein      graft to the second circumflex marginal branch.  The old vein graft      was preserved such that blood can flow retrograde through the old      vein graft to the first circumflex marginal branch as well.  The      circumflex marginal branches were actually quite small.  A 1.0      probe will pass down both branches through the distal anastomoses.  3. The distal left anterior descending coronary artery was grafted      with right internal mammary artery as a free graft.  This vessel      measures 1.2 mm in diameter and was a fair-quality target vessel      for grafting.  It was somewhat small and moderately diseased.  It      was chronically occluded proximally.   Both proximal saphenous vein  anastomoses were performed directly to the  ascending aorta prior to removal of the aortic cross-clamp.  The  proximal end of the old patent vein graft to the circumflex marginal  system was also preserved and left intact.  The other two previously  occluded vein graft proximal anastomoses were ligated and divided.  One  final dose of warm retrograde hot  shot cardioplegia was administered.  The aortic cross-clamp was removed after a total cross-clamp time of 103  minutes.   All proximal and distal coronary anastomoses were inspected for  hemostasis and appropriate for graft orientation.  The proximal end of  this free right internal mammary artery graft was now sewn to the hood  of the new vein graft to the circumflex marginal branch.  Epicardial  pacing wires were fixed to the right ventricular free wall and to the  right atrial appendage.  The patient was rewarmed to 37 degrees  centigrade temperature.  The patient was weaned from cardiopulmonary  bypass without difficulty.  The patient's rhythm at separation from  bypass was normal sinus rhythm.  No inotropic support was required.  Total cardiopulmonary bypass time of the operation was 181 minutes.   Followup transesophageal echocardiogram performed by Dr. Gelene Mink after  separation from bypass demonstrates normal left ventricular function.  No other abnormalities were noted.   The SVC cannula was removed uneventfully.  The aortic cannula was  removed uneventfully.  Protamine was administered to reverse the  anticoagulation.  The long femoral venous cannula was then removed  uneventfully and manual pressure was held on the groin for 20 minutes.  The mediastinum was irrigated with saline solution.  Meticulous surgical  hemostasis was ascertained.  There was moderate coagulopathy.  The  patient was transfused 2 packs of adult platelets and 2 units of fresh  frozen plasma.  Coagulopathy appears to resolve.   The mediastinum and both  left and right pleural spaces were drained  using 4 chest tubes exited through separate stab incisions inferiorly.  The sternotomy was closed using double-strength sternal wire.  The soft  tissues anterior of the sternum were closed in multiple layers and the  skin was closed with a running subcuticular skin closure.   The patient tolerated the procedure well and was transported to the  Surgical Intensive Care Unit in stable condition.  There were no  intraoperative complications.  All sponge, instrument, and needle counts  were verified and correct at completion of the operation.  The patient  was transfused 2 units packed red blood cells during cardiopulmonary  bypass due to anemia which was present prior to surgery and exacerbated  with hemodilution and acute blood loss.      Salvatore Decent. Cornelius Moras, M.D.  Electronically Signed     CHO/MEDQ  D:  07/06/2008  T:  07/07/2008  Job:  [id number]   cc:   Vesta Mixer, M.D.

## 2010-10-10 NOTE — Consult Note (Signed)
[name], [name]                   ACCOUNT NO.:  [id number]   MEDICAL RECORD NO.:  [id number]          PATIENT TYPE:  INP   LOCATION:  2301                         FACILITY:  [facility]   PHYSICIAN:  Pramod P. Pearlean Brownie, MD    DATE OF BIRTH:  01-29-1929   DATE OF CONSULTATION:  DATE OF DISCHARGE:                                 CONSULTATION   REFERRING PHYSICIAN:  Salvatore Decent. Cornelius Moras, MD   REASON FOR REFERRAL:  Stroke.   HISTORY OF PRESENT ILLNESS:  [name] is a 77 year old Caucasian lady  who had a CABG 2 days ago.  She was doing fine postoperatively and also  up and walking until this morning.  At 8 a.m., she developed sudden  onset of right-sided weakness and numbness and complained of dizziness.  Symptoms have persisted since then.  A code stroke was called, but was  cancelled after Dr. Cornelius Moras felt she was not a candidate for intervention.   The patient has no known prior history of stroke or TIA and had a  carotid endarterectomy done in the past.  She has a past medical history  of hyperlipidemia, partial colectomy, asthma, peripheral vascular  disease, and ischemic heart disease.   HOME MEDICATIONS:  Toprol, aspirin, Nexium, Imdur, fish oil, and vitamin  D.   MEDICATION ALLERGIES:  SULFA, COMPAZINE and intolerance to NIASPAN.   SOCIAL HISTORY:  The patient is married, lives with her husband.  She  does not smoke or drink.   PAST SURGICAL HISTORY:  Right carotid endarterectomy, recent CABG 2 days  ago, and prior colectomy.   REVIEW OF SYSTEMS:  As documented above.   PHYSICAL EXAMINATION:  GENERAL2:  A frail elderly Caucasian lady who is  not in distress.  VITAL SIGNS:  She is afebrile this morning with a pulse rate of 100 per  minute, regular sinus, blood pressure is 118/80, and respiratory rate is  18 per minutes.  EXTREMITIES:  Distal pulses are well felt.  CHEST:  She has chest tubes and surgical scar from recent CABG.  CARDIAC:  Regular heart sounds.  LUNGS:  Clear to  auscultation.  NEUROLOGICAL:  She is pleasant, awake, alert, and cooperative.  Eye  movements of full range.  There is mild vertical disconjugate gaze was  hypertropia of the left eye, but no subjective diplopia.  There is mild  right lower facial weakness.  She does not have a field cut.  Tongue is  midline.  Motor system exam reveals dense right hemiplegia with 0/5  strength on the right with decreased sensation on the right.  She is  unable to sustain left lower extremity elevation, but that is due to  back.  On the NIH stroke scale, she scored 9.   DATA REVIEWED:  Noncontrast CAT scan of the head done today personally  is reviewed, shows no acute abnormality.   IMPRESSION:  A 77 year old lady 2 days' post coronary artery bypass  grafting, sudden onset of right hemiplegia with dizziness and some  disconjugate gaze, likely left brainstem subcortical infarct.  The  patient has presented  within an hour of onset of symptoms, but will not  qualify for IV thrombolysis with TPA due to recent cardiac surgery.  However, if her large vessel vertebral-basilar artery occlusion is  found, we may consider catheter-based intervention.  I had a long  discussion with the patient's husband as well as with Dr. Cornelius Moras regarding  available treatment options and we will proceed with initially obtaining  a CT angiogram of the brain.  If large vessel occlusion is found, we may  then consider catheter-based interventions.  The patient is critically  ill, at significant risk for worsening and require complex decision-  making.  Critical care time spent 1 hour.  She will continue aspirin,  optimize her fluid and volume status as well as hematocrit.  Physical,  occupational, and speech therapy consults were following and we will  follow the patient as needed.  Kindly call for questions.           ______________________________  Sunny Schlein. Pearlean Brownie, MD     PPS/MEDQ  D:  07/08/2008  T:  07/08/2008  Job:   [id number]

## 2010-10-10 NOTE — Discharge Summary (Signed)
[name], [name] NO.:  [id number]   MEDICAL RECORD NO.:  [id number]          PATIENT TYPE:  INP   LOCATION:  2038                         FACILITY:  [facility]   PHYSICIAN:  Salvatore Decent. Cornelius Moras, M.D. DATE OF BIRTH:  04/06/1929   DATE OF ADMISSION:  07/14/2008  DATE OF DISCHARGE:                               DISCHARGE SUMMARY   FINAL DIAGNOSES:  1. Congestive heart failure.  2. Right pleural effusion, status post redo coronary artery bypass      grafting x3 on July 06, 2008.   SECONDARY DIAGNOSES:  1. Hyperlipidemia.  2. Degenerative arthritis.  3. History of gout.  4. Asthma.  5. History of cerebrovascular disease.  6. Diverticulosis.  7. Status post coronary artery bypass grafting x5 done in 1997 by Dr.      Laneta Simmers.  8. Status post right carotid endarterectomy in 1995.  9. Status post partial colectomy for diverticular disease.  10.Status post left knee surgery.   IN-HOSPITAL OPERATIONS AND PROCEDURES:  Right thoracentesis, ultrasound-  guided done in Radiology.   HISTORY AND PHYSICAL AND HOSPITAL COURSE:  The patient is a 77 year old  female with known history of coronary artery disease who had been  recently hospitalized for redo coronary artery bypass grafting x3 by Dr.  Cornelius Moras.  The patient was discharged to home on July 14, 2008.  She  returned to the hospital shortly after discharge with what she felt like  was increasing shortness of breath.  The patient was given intravenous  Lasix in the emergency department with some improvement in symptoms.  Chest x-ray done revealed evidence of small-to-moderate sized right-  sided effusion.  A CT scan of the chest showed no pulmonary embolism.  It did show some bilateral effusions with interstitial edema.  Dr. Donata Clay was then called and contacted.  For further details of the  patient's past medical history and physical exam, please see dictated H  and P.   Dr. Donata Clay finally evaluated the  patient in the emergency room on  July 15, 2008.  The patient was admitted to 2000 telemetry floor.  He continued on the patient's medications and started her on p.o. Lasix.  A right thoracentesis was ordered to be done by Radiology.  This was  done on July 15, 2008, in the afternoon.  They removed 450 amounts  of serosanguineous fluid.  The patient tolerated well.  A PMI chest x-  ray was obtained on July 16, 2008, which showed improved effusion.  The patient also stated she was feeling better and was able to be weaned  off oxygen with O2 sats greater than 90% on room air.  The patient's  vital signs were monitored during her readmission.  She remained  afebrile.  She remained in normal sinus rhythm.  Blood pressure was  stable.  She was up ambulating with assistance.  All incisions remained  clean, dry, and intact and healing well.  The patient is tentatively  ready for discharge home in the a.m. on July 17, 2008, pending her  chest x-ray, remained stable  as well as 2D echocardiogram done on  July 16, 2008, is without significant findings.   FOLLOWUP APPOINTMENTS:  Followup appointment has been arranged with Dr.  Cornelius Moras for August 09, 2008, at 11:15 a.m.  The patient will need to obtain  PMI chest x-ray 30 minutes prior to this appointment.  She is still to  follow up with Dr. Elease Hashimoto in 2 weeks and Dr. Pearlean Brownie in 2 months.  She  needs to contact them to make these arrangements.   ACTIVITY:  The patient to continue ambulating 3-4 times per day,  progress as tolerated, and continue her breathing exercises.  She is  told no driving until released to do so.  No lifting over 10 pounds.   INCISIONAL CARE:  The patient is told to continue washing her incisions  using soap and water.  It is okay to shower.  She is to contact the  office if she develops any drainage or opening from any of her incision  sites.   DIET:  The patient is educated on diet to be low fat, low  salt.   DISCHARGE MEDICATIONS:  1. Crestor 40 mg at night.  2. Nexium 40 mg daily.  3. Aspirin 325 mg daily.  4. Xanax 0.5 mg at night p.r.n.  5. Metoprolol 50 mg b.i.d.  6. Vitamin C daily.  7. Vitamin D daily.  8. Vitamin E daily.  9. Fish oil 1 g daily.  10.Ultram 50 mg one to two tablets q.4-6 h. p.r.n.  11.Lasix 40 mg daily.  12.Potassium chloride 20 mEq daily.      Theda Belfast, PA      Salvatore Decent. Cornelius Moras, M.D.  Electronically Signed    KMD/MEDQ  D:  07/16/2008  T:  07/16/2008  Job:  [id number]   cc:   Vesta Mixer, M.D.  Pramod P. Pearlean Brownie, MD

## 2010-10-10 NOTE — Consult Note (Signed)
NAMESOPHIAROSE, EADES                   ACCOUNT NO.:  [id number]   MEDICAL RECORD NO.:  [id number]          PATIENT TYPE:  AMB   LOCATION:  SDS                          FACILITY:  [facility]   PHYSICIAN:  Salvatore Decent. Cornelius Moras, M.D. DATE OF BIRTH:  23-Oct-1928   DATE OF CONSULTATION:  07/05/2008  DATE OF DISCHARGE:                                 CONSULTATION   REQUESTING PHYSICIAN:  Vesta Mixer, MD   REASON FOR CONSULTATION:  Severe three-vessel coronary artery disease  and vein graft disease, status post coronary artery bypass grafting in  1997.   HISTORY OF PRESENT ILLNESS:  [name] is a 76 year old female from  [place] with known history of coronary artery disease,  hyperlipidemia, peripheral vascular disease, gout, and degenerative  arthritis.  The patient's cardiac history dates back to 1997, at which  time she underwent coronary artery bypass grafting x5 by Dr. Laneta Simmers.  Grafts placed at the time of surgery included left internal mammary  artery to the distal left anterior descending coronary artery, saphenous  vein graft to the diagonal branch, sequential saphenous vein graft to  the first and second obtuse marginal branches, and saphenous vein graft  to the distal right coronary artery.  The patient did well for quite  some time.  However, in 2006, repeat catheterization was performed for  symptoms of angina.  At that time, her left internal mammary artery  graft was atretic.  All of the vein grafts were still functioning  normally and the left anterior descending coronary artery was filling  via flow through the diagonal graft.  The proximal left anterior  descending coronary artery was occluded.  She was treated medically.  Over the past year, the patient has developed progressive exertional  fatigue and intermittent episodes of substernal chest discomfort.  In  December, she was hospitalized for symptoms of left shoulder pain, neck  pain, and chest pain.  She ruled out for  acute myocardial infarction  based on cardiac enzymes, although electrocardiogram revealed poor R-  wave progression in comparison with previous electrocardiograms.  MRI of  the neck revealed degenerative disk disease in the cervical spine.  She  continued to be treated medically.  Around Christmas, she developed  worsening symptoms and she suffered a prolonged episode of substernal  chest discomfort.  Ultimately, she underwent a stress Cardiolite exam in  January which showed distal anterior wall defect consistent with  probable myocardial infarction involving the left anterior descending  coronary artery distribution.  Cardiac catheterization was then  performed confirming progression of native coronary artery disease and  vein graft disease.  There was moderate left ventricular dysfunction  with ejection fraction estimated at 35-40%.  The patient was referred to  Dr. Silvano Bilis who saw her in consultation on June 18, 2008.  She  eventually was scheduled for elective redo coronary artery bypass  grafting in March 2010.  However, since then the patient has had further  problems with substernal chest pressure relieved with nitroglycerin.  She had a prolonged episode of discomfort 3 days ago which prompted her  to  present to the hospital for further management.  She was admitted to  the hospital and ruled out for myocardial infarction based on serial  cardiac enzymes.  She has remained stable on intravenous nitroglycerin  and heparin.  Cardiac surgical consultation was requested to consider  urgent surgical revascularization.   REVIEW OF SYSTEMS:  GENERAL:  The patient reports that her appetite is  only marginal, although she has not been gaining nor losing weight  recently.  She has decreased energy.  CARDIAC:  Notable for progressive  episodes of substernal chest pressure with occasional episodes of pain  radiating to her left arm and shoulder.  These seem to be typically  associated with  activity but have also occurred at rest.  The symptoms  are usually relieved with nitroglycerin.  She had a brief episode of  similar discomfort last night here in the hospital.  The patient reports  exertional shortness of breath that has progressed over the last several  months.  She denies resting shortness of breath, PND, orthopnea,  palpitations, or syncope.  She has mild occasional intermittent lower  extremity edema.  RESPIRATORY:  Notable for exertional shortness of  breath.  The patient denies productive cough, hemoptysis, or wheezing.  GASTROINTESTINAL:  Negative.  The patient has no difficulty swallowing.  She denies hematochezia, hematemesis, or melena.  Bowel function is  regular.  MUSCULOSKELETAL:  Notable for chronic pain afflicting her back  related to degenerative arthritis.  NEUROLOGIC:  Notable for some mild  instability of gait that is chronic and stable.  The patient has not had  any dizzy spells or syncopal episodes.  The patient denies any transient  monocular blindness.  The patient denies transient numbness or weakness  involving either upper or lower extremity.  GENITOURINARY:  Notable for  some urinary frequency.  The patient denies urinary urgency or dysuria.  She has been treated for bladder infections in the past.  HEENT:  Negative.  PSYCHIATRIC:  Notable for anxiety.   PAST MEDICAL HISTORY:  1. Coronary artery disease.  2. Hyperlipidemia.  3. Degenerative arthritis.  4. Gout.  5. Asthma.  6. Cerebrovascular disease.  7. Diverticulosis.   PAST SURGICAL HISTORY:  1. Coronary artery bypass grafting x5 by Dr. Laneta Simmers, 1997.  2. Right carotid endarterectomy, 1995.  3. Partial colectomy for diverticular disease.  4. Left knee surgery.   FAMILY HISTORY:  Notable for strong presence of coronary artery disease.   SOCIAL HISTORY:  The patient is married and lives with her husband here  in [place].  She remains reasonably active physically, although she   admits to having slowed down over the last year or so.  She is a  nonsmoker.  She denies significant alcohol consumption.   MEDICATIONS PRIOR TO ADMISSION:  1. Nexium 40 mg daily.  2. Toprol-XL 50 mg daily.  3. Isosorbide mononitrate 60 mg daily.  4. Crestor 20 mg daily.  5. Aspirin 81 mg daily.  6. Fish oil 1000 mg daily.  7. Vitamin D 1000 units daily.  8. Vitamin C 1000 units daily.  9. Multivitamin 1 tablet daily.   DRUG ALLERGIES:  1. SULFA.  2. NIASPAN.  3. MORPHINE causes mild confusion.   PHYSICAL EXAMINATION:  GENERAL:  The patient is a well-appearing female  who appears her stated age in no acute distress.  HEENT:  Unrevealing.  NECK:  Supple.  There is a well-healed scar on the right neck from  carotid endarterectomy.  There are bilateral carotid bruits.  There is  no palpable lymphadenopathy.  CHEST:  Auscultation of the chest demonstrates clear breath sounds which  are symmetrical bilaterally.  There is a well-healed median sternotomy  scar.  The sternum is stable.  CARDIOVASCULAR:  Notable for regular rate and rhythm.  No murmurs, rubs,  or gallops are noted.  ABDOMEN:  Soft and nontender.  There are no palpable masses.  Bowel  sounds are present.  EXTREMITIES:  Warm and adequately perfused.  There is no lower extremity  edema.  There is a well-healed scar up and down the right thigh and  lower leg from previous saphenous vein procurement.  There is no sign of  significant venous insufficiency.  Distal pulses are not palpable in  either lower leg at the ankle.  Allen test appears to be intact, left  wrist with brisk palmar arch circulation.  RECTAL/GU:  Both deferred.  NEUROLOGIC:  Grossly nonfocal and symmetrical throughout.   DIAGNOSTIC TESTS:  Cardiac catheterization performed by Dr. Elease Hashimoto,  June 14, 2008 was reviewed.  This was also compared with a previous  catheterization from 2006.  The most recent catheterization demonstrates  100% occlusion of the  mid left anterior descending coronary artery after  long segment 80% proximal stenosis.  There is codominant coronary  circulation with a large left circumflex coronary artery.  First and  second circumflex marginal branches are occluded.  The distal left  circumflex terminates in the large posterolateral branch that is patent.  There is small nondominant right coronary artery which is chronically  occluded proximally.  The vein graft to the right coronary artery is  chronically occluded.  Vein graft to the diagonal branch is chronically  occluded.  Left internal mammary artery is atretic.  There is left-to-  left collateral filling of the distal left anterior descending coronary  artery.  There is a vein graft to the circumflex marginal branches which  is patent with 80-90% stenosis in the midportion of this graft.  Left  ventricular function is moderately reduced with ejection fraction  estimated at 35-40%.  There is anteroapical akinesis.  There is no  mitral regurgitation.   IMPRESSION:  Severe three-vessel coronary artery disease with vein graft  disease status post coronary artery bypass grafting in 1997.  The  patient has moderate left ventricular dysfunction with symptoms of class  IV unstable angina and progressive exertional shortness of breath.  I  agree the patient would probably best be treated with elective redo  coronary artery bypass grafting.  Risks associated with surgery will be  somewhat elevated due to the patient's age, cerebrovascular disease, and  previous bypass surgery.   PLAN:  I have discussed options at length with Ms. Parran and her  husband.  Alternative treatment strategies have been discussed.  They  understand and accept all potential associated risks of surgery  including but not limited to risk of death, stroke, myocardial  infarction, congestive heart failure, respiratory failure, pneumonia,  bleeding requiring blood transfusion, arrhythmia, heart  block with  bradycardia requiring permanent pacemaker, late recurrence of coronary  artery disease.  All their questions have been addressed.  We plan to  proceed with surgery tomorrow.      Salvatore Decent. Cornelius Moras, M.D.  Electronically Signed     CHO/MEDQ  D:  07/05/2008  T:  07/05/2008  Job:  [id number]   cc:   Vesta Mixer, M.D.

## 2010-10-10 NOTE — Assessment & Plan Note (Signed)
OFFICE VISIT   [name], [name]  DOB:  10/07/1928                                        August 02, 2008  CHART #:  [id number]   The patient returns to the office today for further followup, status  post redo coronary artery bypass grafting x3 on July 06, 2008.  She  was last seen here in the office 1 week ago, and 2 days later she  underwent right thoracentesis for recurrent right pleural effusion with  fluid overload.  Since then, she has done much better.  She returns to  the office today with her husband and overall she feels like she is  finally making some progress.  She has not had any further problems with  shortness of breath since her thoracentesis last week.  She remains on  once a day Lasix, and her weight is remaining stable.  She is ambulating  a bit more and she also admits that she is eating a bit more and even  sleeping some at night.  She still has tremendous difficulty with  anxiety, and she apparently is scheduled to see Dr. Ty Hilts tomorrow.  However, she is noticeably improved since last week.   CURRENT MEDICATIONS:  1. Nexium 40 mg daily.  2. Crestor 20 mg daily.  3. Aspirin 81 mg daily.  4. Xanax 0.5 mg daily at bedtime.  5. Metoprolol 50 mg twice daily.  6. Lasix 40 mg daily.  7. Potassium chloride 20 mEq daily.  8. Fish oil capsule.  9. Vitamin D.  10.Ester-C.  11.Multivitamin.  12.Vitamin E daily.   PHYSICAL EXAMINATION:  GENERAL:  A frail-appearing female.  VITAL SIGNS:  Blood pressure 127/74, pulse 90 and regular, oxygen  saturation 98% on room air.  CHEST:  A median sternotomy incision that is healing nicely.  The  sternum is stable on palpation.  LUNGS:  Breath sounds are clear to auscultation with somewhat diminished  breath sounds at the right lung base.  No wheezes or rhonchi are noted.  CARDIOVASCULAR:  Regular rate and rhythm.  No murmurs, rubs, or gallops  are noted.  ABDOMEN:  Soft and nontender.  EXTREMITIES:   Warm and well perfused.  There is no lower extremity  edema.   DIAGNOSTIC TESTS:  Chest x-ray obtained today at the [facility] is reviewed and compared with her x-rays from last week.  This  demonstrates stable radiographic appearance of the persistent right  basilar atelectasis and small right pleural effusion.  This has not  changed significantly since the x-ray that was obtained after  thoracentesis on July 28, 2008.   IMPRESSION:  The patient appears to be slowly improving.  She still has  a small residual right pleural effusion with right lower lobe  atelectasis, but this remains stable.  Symptomatically she is slowly  improving.   PLAN:  We have not made any changes in her current medications.  I think  she should remain on Lasix for at least the next couple of weeks to  decrease the likelihood of her pleural effusion reaccumulating.  I have  encouraged her to continue to increase her physical activity as  tolerated.  We will see her back in 4 weeks with a repeat chest x-ray  for followup.   Salvatore Decent. Cornelius Moras, M.D.  Electronically Signed   CHO/MEDQ  D:  08/02/2008  T:  08/02/2008  Job:  [id number]   cc:   Vesta Mixer, M.D.  Ernestina Penna, M.D.

## 2010-10-10 NOTE — H&P (Signed)
[name], HISE                   ACCOUNT NO.:  [id number]   MEDICAL RECORD NO.:  [id number]          PATIENT TYPE:  INP   LOCATION:  3732                         FACILITY:  [facility]   PHYSICIAN:  Salvatore Decent. Cornelius Moras, M.D. DATE OF BIRTH:  1929-01-15   DATE OF ADMISSION:  07/15/2008  DATE OF DISCHARGE:                              HISTORY & PHYSICAL   HISTORY OF PRESENT ILLNESS:  The patient is a 77 year old female with  known history of coronary artery disease who has been recently  hospitalized for redo coronary artery bypass grafting x3 by Dr. Cornelius Moras.  She was discharged yesterday.  For full details of the hospitalization,  please see the previously dictated discharge summary.  She returned to  the hospital shortly after discharge with what she felt was increasing  shortness of breath.  She was given intravenous Lasix in the emergency  department with some improvement of symptoms.  A chest x-ray revealed  evidence of a small-to-moderate sized right-sided effusion.  A CT scan  of the chest showed no pulmonary embolism.  It did show some bilateral  effusions with interstitial edema.  She was diuresed some in the  emergency department, and felt did require readmission for further  evaluation and treatment to include a thoracentesis.   PHYSICAL EXAMINATION:  VITAL SIGNS:  Afebrile with normal vital signs.  She is in normal sinus rhythm.  CARDIOVASCULAR:  Her incisions are clean, although there is some bloody  drainage from the right chest tube site, which has been fairly steady.  She has had this in the hospital and treated with dressing changes.  There is no evidence of infection.   LABORATORY DATA:  Her hematocrit to be 30%, potassium 3.5, creatinine  0.8, and white blood cell count 10.0.  BNP was 540.  Cardiac enzymes  were negative.   PLAN:  Admission for further diuresis, right-sided thoracentesis as well  as dressing changes to the right chest tube site.  It is felt that some  of  her symptoms are related to her longstanding issues associated with  anxiety, but certainly she does have evidence of congestive heart  failure.  Further plans will be determined based on hospital course.      Rowe Clack, P.A.-C.      Salvatore Decent. Cornelius Moras, M.D.  Electronically Signed    WEG/MEDQ  D:  07/15/2008  T:  07/15/2008  Job:  [id number]   cc:   Vesta Mixer, M.D.  Pramod P. Pearlean Brownie, MD

## 2010-10-10 NOTE — Discharge Summary (Signed)
[name], [name] NO.:  [id number]   MEDICAL RECORD NO.:  [id number]          PATIENT TYPE:  INP   LOCATION:  2023                         FACILITY:  [facility]   PHYSICIAN:  Salvatore Decent. Cornelius Moras, M.D. DATE OF BIRTH:  Jan 28, 1929   DATE OF ADMISSION:  07/02/2008  DATE OF DISCHARGE:  07/14/2008                               DISCHARGE SUMMARY   HISTORY:  The patient is a 77 year old female with a known history of  coronary artery disease.  She is status post coronary artery bypass  grafting in 1997, at which time she underwent coronary artery bypass  grafting x5 by Dr. Laneta Simmers.  The patient did well for quite some time;  however, in 2006, repeat catheterization was performed for symptoms of  angina.  At that time, her left internal mammary artery graft was found  to be atretic.  All of the other grafts were functioning normally, and  the left anterior descending coronary artery was filling via flow  through the diagonal graft.  Over the past year, however, the patient  has developed progressive exertional fatigue and intermittent episodes  of substernal discomfort.  In December, she was hospitalized for  symptoms of left shoulder pain, neck pain, and chest pain.  She did rule  out for acute myocardial infarction based on cardiac enzymes, although  electrocardiogram did reveal poor R-wave progression in comparison to  previous electrocardiograms.  She was found on MRI of the neck to have  degenerative disk disease in the cervical spine.  She was continued to  be treated medically.  Around Christmas of last year, she developed  worsening symptoms and suffered a prolonged episode of substernal chest  discomfort.  Ultimately, she underwent a stress Cardiolite exam in  January, which showed distal anterior wall defect consistent with  probable myocardial infarction involving the LAD coronary artery  distribution.  A cardiac catheterization was then performed which showed  progression of the native coronary artery disease and the vein graft  disease.  Her ejection fraction was estimated at that time to be in the  range of 35% to 40%.  The patient was referred to Dr. Laneta Simmers and seen in  consultation on June 18, 2008.  She was scheduled for redo coronary  surgery in March; however, she developed further symptoms of pain and  was admitted this hospitalization for further evaluation and treatment.   PAST MEDICAL HISTORY:  Coronary artery disease as described above.   OTHER DIAGNOSES:  1. Hyperlipidemia.  2. Degenerative arthritis.  3. Gout.  4. Asthma.  5. Cerebrovascular disease.  6. Diverticulosis.   PAST SURGICAL HISTORY:  1. Coronary artery bypass grafting x5 in 1997 by Dr.  Laneta Simmers.  2. Right carotid endarterectomy in 1995.  3. Partial colectomy for diverticular disease.  4. Left knee surgery.   MEDICATIONS:  Prior to admission include the following,  1. Nexium 40 mg daily.  2. Toprol-XL 50 mg daily.  3. Isosorbide mononitrate 60 mg daily.  4. Crestor 20 mg daily.  5. Aspirin 81 mg daily.  6. Fish oil 1000 mg daily.  7. Vitamin D 1000 units daily.  8. Vitamin C 1000 mg daily.  9. Multivitamin 1 tablet daily.   ALLERGIES:  1. SULFA.  2. NIASPAN.  3. MORPHINE, which causes mild confusion, not true allergy.   Family history, social history, review of symptoms and physical exam,  please see history and physical done at the time of admission.   HOSPITAL COURSE:  The patient was admitted for further evaluation and  treatment for unstable anginal symptoms.  She was seen in surgical  consultation by Tressie Stalker, MD who evaluated the patient and her  studies and did determine that we should proceed at this time with the  redo coronary artery bypass grafting surgery.  The patient was found  preoperatively to have a possible UTI and was treated with empiric  Cipro.  She was felt to be stable for proceeding with surgery and on  July 06, 2008, she underwent the following procedure:  Redo coronary  artery bypass grafting x3, the following grafts were placed,  1. Right internal mammary artery to the LAD.  2. Saphenous vein graft to the left posterolateral.  3. Saphenous vein graft to the obtuse marginal.  The patient tolerated      the procedure well, was taken to the surgical intensive care unit      in stable condition.   POSTOPERATIVE HOSPITAL COURSE:  The patient has overall done quite well.  She did have a postoperative subcortical cerebrovascular accident.  Her  symptoms were transient.  They have resolved completely.  She was seen  in Neurology consultation by Dr. Pearlean Brownie.  She was not felt to be a  candidate for anticoagulation or thrombolytics.  She also had episode of  postoperative atrial fibrillation.  This has been chemically  cardioverted to a normal sinus rhythm.  I failed to mention that the  patient's stroke was a left subcortical stroke with the right-sided  weakness.  Additionally postoperatively, the patient had some  opacification of her right side of her chest and her effusion was unable  to be drained by the original chest tube, and she did require additional  chest tube placement, which was done on July 07, 2008.  The effusion  resolved significantly and that chest tube was able to be discontinued  as were all routine lines, monitors, and drainage devices.  The  patient's incisions are all healing well.  The patient additionally has  some difficulty postoperatively with depression and anxiety.  She had  some preoperative difficulties with this as well, but it was exacerbated  during the postoperative period.  She has, however, significantly  improved with time in this regard.  She has been given Xanax as a sleep  aid as she primarily has significant fearfulness during the nighttime.  She feels much better in this regard.  The patient has also had an acute  blood loss anemia.  This is stabilized  with time.  Her most recent  hemoglobin and hematocrit dated July 11, 2008, was 11 and 31  respectively.  The patient has required a moderate diuresis for volume  overload as well.  She has responded well to pulmonary toilet and has  maintained good saturations on room air.  Her overall status has been  deemed to be quite acceptable for discharge on today's date July 14, 2008.   CONDITION ON DISCHARGE:  Stable and improving.   MEDICATONS ON DISCHARGE:  Are as follows,  1. Nexium 40 mg daily.  2. Crestor 40  mg daily.  3. Vitamin C 1000 mg daily.  4. Vitamin D and E once daily as preoperatively.  5. Aspirin 325 mg daily.  6. Fish oil 1 g daily.  7. Metoprolol 50 mg twice daily.  8. Ultram 50 mg 1-2 every 4-6 hours as needed for pain.  9. Xanax 0.5 mg at bedtime as needed.   Of note, the patient does appear to have a postoperative UTI as well.  We will restart Cipro for an additional 5 day course.  Her culture is  currently pending.   FINAL DIAGNOSIS:  Severe 3-vessel and vein graft disease requiring the  redo surgical revascularization as described.   OTHER DIAGNOSES:  1. Postoperative subcortical left-sided cerebrovascular accident with      complete resolution of neurological deficits.  2. Postoperative transient atrial fibrillation.  3. Postoperative volume overload.  4. Postoperative acute blood loss anemia.  5. Postoperative depression and anxiety with component of paranoia.  6. Degenerative arthritis.  7. Hyperlipidemia.  8. History of gout.  9. History of asthma.  10.History of cerebrovascular disease.  11.History of diverticulosis.  12.History of previous surgeries as listed in the history.   INSTRUCTIONS:  The patient will receive written instructions in regard  to medications, activity, diet, wound care, and followup.   FOLLOWUP:  The patient requested to see Dr. Elease Hashimoto in 2 weeks and Dr.  Cornelius Moras in 3 weeks postdischarge.  She should also see Dr. Pearlean Brownie in  2  months.      Rowe Clack, P.A.-C.      Salvatore Decent. Cornelius Moras, M.D.  Electronically Signed    WEG/MEDQ  D:  07/14/2008  T:  07/14/2008  Job:  [id number]   cc:   Salvatore Decent. Cornelius Moras, M.D.  Vesta Mixer, M.D.  Pramod P. Pearlean Brownie, MD

## 2010-10-10 NOTE — H&P (Signed)
NAMESHELLYE, ZANDI NO.:  [id number]   MEDICAL RECORD NO.:  [id number]         PATIENT TYPE:  CATH   LOCATION:                               FACILITY:  [facility]   PHYSICIAN:  Vesta Mixer, M.D. DATE OF BIRTH:  [birth year]   DATE OF ADMISSION:  09/08/2008  DATE OF DISCHARGE:                              HISTORY & PHYSICAL   [name] is a elderly female with a history of coronary artery disease  with a recent redo coronary artery bypass grafting.  She is admitted now  for heart catheterization after having recurrent episodes of chest  discomfort.   [name] is an elderly female with a history of coronary artery bypass  grafting.  She had redo bypass grafting by Dr. Cornelius Moras in February of this  year.  She has had a very long and complicated recovery.  She has had  bouts of depression and had lots of aches and pains and some chest  discomfort.  She has recently started having some episodes of chest  discomfort.  These episodes of chest pain are described as a tightness  or heaviness.  They have been relieved with nitroglycerin and later were  eased when we started the Imdur.  She has not been exercising quite as  much as she would like.  She has been to the office twice this week for  episodes of chest pain and she is now scheduled for elective admission  for her heart catheterization.   She denies any syncope or presyncope.  She denies any PND or orthopnea.   CURRENT MEDICATIONS:  1. Toprol-XL 50 mg twice a day.  2. Aspirin 325 mg a day.  3. Nexium 40 mg a day.  4. Crestor 20 mg a day.  5. Fish oil once a day.  6. Vitamin D once a day.  7. Lasix 40 mg a day.  8. Potassium chloride once a day.  9. Imdur 60 mg a day.  10.Xanax as needed.   She is allergic to SULFA, COMPAZINE, and NIASPAN.   PAST MEDICAL HISTORY:  1. Coronary artery disease.  She is status post redo coronary bypass      grafting by Dr. Cornelius Moras in February of this year.  2. Congestive heart  failure.  She has an ejection fraction of around      42%.  She has had a previous anterior wall myocardial infarction.  3. Hypertension.  4. Peripheral vascular disease.  She is status post right carotid      endarterectomy.  5. History of partial colectomy.  6. Dyslipidemia.   SOCIAL HISTORY:  The patient is a nonsmoker and does not drink alcohol.   FAMILY HISTORY:  Positive for coronary artery disease.   REVIEW OF SYSTEMS:  Reviewed in the HPI.  All other systems were  reviewed and are negative.   PHYSICAL EXAMINATION:  GENERAL:  She is an elderly female in no acute  distress.  She is alert and oriented x3 and her mood and affect are  normal.  VITAL SIGNS:  Her weight is 144, blood  pressure is 120/60 with heart  rate of 76.  HEENT:  Carotids 2+.  She has a right carotid endarterectomy scar.  There is no bruits.  No thyromegaly.  Her sclerae are nonicteric.  Her  mucous membranes are moist.  NECK:  Supple.  LUNGS:  Clear.  BACK:  Nontender.  HEART:  Regular rate, S1 and S2.  Her PMI is nondisplaced.  ABDOMEN:  Good bowel sounds and is nontender.  There is no  hepatosplenomegaly.  There is no guarding or rebound.  EXTREMITIES:  No clubbing, cyanosis, or edema.  Her pulses are intact.  SKIN:  Warm and dry without rashes.  NEUROLOGIC:  Cranial nerves II through XII are intact, and motor and  sensory function are intact.  Her gait is normal.   EKG revealed normal sinus rhythm.  She has an old anterior wall  myocardial infarction.  There is mild ST-segment elevation in leads aVR  and aVL, but this appears to be old.   Her CPK and troponin levels x2 sets of the past several days are  negative.   Ms. Clavel presents with persistent episodes of chest pain and discomfort  that is relieved with nitroglycerin.  We have drawn the troponin level  on Wednesday and Friday this week following prolonged episodes of this  chest discomfort, and both times the troponin was negative.  I  would  like to proceed with heart catheterization for further evaluation of  this.  She had redo bypass grafting just several months ago and it would  be extremely unlikely that she would have closure of one of her grafts.  She does have significant vascular disease and we certainly have seen  premature closure of these grafts.  We have discussed the risks,  benefits, and options of the redo heart catheterization.  She  understands and agrees to proceed.      Vesta Mixer, M.D.  Electronically Signed     PJN/MEDQ  D:  09/03/2008  T:  09/05/2008  Job:  [id number]

## 2010-10-10 NOTE — Discharge Summary (Signed)
[name], [name]                   ACCOUNT NO.:  [id number]   MEDICAL RECORD NO.:  [id number]          PATIENT TYPE:  INP   LOCATION:  3731                         FACILITY:  [facility]   PHYSICIAN:  Ladell Pier, M.D.   DATE OF BIRTH:  1928-11-08   DATE OF ADMISSION:  05/15/2008  DATE OF DISCHARGE:  05/17/2008                               DISCHARGE SUMMARY   ADMISSION DIAGNOSES:  1. Left upper arm pain radiating to the neck.  2. Nausea.  3. Coronary artery disease status post anterior wall myocardial      infarction status post coronary artery bypass grafting in 1997.      Status post cath in January 2006.  Status post stress test November      2009.  4. Left bundle branch block.  5. Dyslipidemia.  6. Peripheral vascular disease status post bilateral carotid      endarterectomy.  7. Hypertension.  8. Diverticulosis.  9. Status post partial colon resection for abscess.  10.Status post appendectomy.  11.Remote history of endometriosis.  12.Obstructive sleep apnea.  13.Hypopnea syndrome confirmed by sleep study done September 25, 2007.   DISCHARGE PROCEDURES:  None.   CONSULTANTS:  Cardiology.   DISCHARGE MEDICATIONS:  1. Isosorbide mononitrate 60 mg daily.  2. Toprol XL 50 mg daily.  3. Ibuprofen 400 mg t.i.d. p.r.n.  4. Nexium 40 mg daily.  5. Crestor 40 mg daily.  6. Vitamin C 1000 mg daily.  7. Vitamin B1 p.o. daily.  8. Aspirin 81 mg daily.  9. Fish oil 1 tablet daily.   FOLLOWUP APPOINTMENTS:  The patient to follow up with cardiology in 1 to  2 weeks.  Follow up with primary care doctor, Dr. Christell Constant, in 1 to 2  weeks.   HISTORY OF PRESENT ILLNESS:  The patient is 77 year old female who  presented with pain in the left upper arm radiating to the neck while  she was decorating her Christmas tree.  She felt mild shortness of  breath and nausea but no diaphoresis.  Please see admission note for  remainder of history, past medical history, family history, surgical  history, social history, meds, allergies, and review of systems per  admission H and P.   PHYSICAL EXAMINATION ON DISCHARGE:  VITAL SIGNS:  Temperature 97, pulse  71, respirations 16, blood pressure 120/64, pulse ox 96% on room air.  GENERAL:  Patient sitting up in bed.  Does not seem to be in any acute  distress.  The patient is dressed to go.  NECK:  Supple.  CHEST:  Clear to auscultation bilaterally.  HEART:  Regular rate and rhythm.  ABDOMEN:  Positive bowel sounds.  EXTREMITIES:  No edema.   HOSPITAL COURSE:  1. Neck pain, shoulder pain:  The patient was admitted to the      hospital.  She had, MRI of the T-spine and C-spine done that showed      multiple spondyloses and stable T8 vertebral augmentation      appearance.  No new compression fracture.  The patient was treated      for  her pain with ibuprofen.  Pain improved.  With her heart      history, cardiology was also consulted for possibly cardiac      etiology of her pain.  Her pain was deemed more musculoskeletal.      Cardiology cleared the patient to go home to follow up with them.      The patient will be discharged on Imdur 60 and ibuprofen for pain      and following up with cardiology.  2. Dyslipidemia.  She was continued on her Crestor while she was      inpatient.   DISCHARGE LABS:  Troponin 0.01, CK 49, MB 1.4 BNP 464.  Total  cholesterol 138, triglyceride 88, HDL 38, LDL 74.  TSH 1.562 and a D-  dimer 0.31.  Chest x-ray showed stable borderline cardiomegaly and mild  changes of COPD, no acute abnormality.      Ladell Pier, M.D.  Electronically Signed     NJ/MEDQ  D:  05/17/2008  T:  05/17/2008  Job:  [id number]   cc:   Ernestina Penna, M.D.  Vesta Mixer, M.D.

## 2010-10-10 NOTE — H&P (Signed)
NAMECORINNE, [name] NO.:  [id number]   MEDICAL RECORD NO.:  [id number]         PATIENT TYPE:  CATH   LOCATION:                               FACILITY:  [facility]   PHYSICIAN:  Vesta Mixer, M.D. DATE OF BIRTH:  11-28-1928   DATE OF ADMISSION:  09/08/2008  DATE OF DISCHARGE:                              HISTORY & PHYSICAL     [name] is a 77 year old female with a history of coronary artery  disease.  She is status post  redo coronary artery bypass grafting  several months ago.  She continues to have intermittent episodes of  chest pain and chest heaviness.  Her cardiac enzymes have been negative.  Because of her persistent problems, we are bringing her in for heart  catheterization.   The patient has had intermittent episodes of chest heaviness for the  past week or so.  These episodes are sound, very similar to her previous  episodes of angina.  They resolve fairly quickly with nitroglycerin.  She has not had any episodes of syncope or presyncope.  Chest pains have  never been all that prolonged.   CURRENT MEDICATIONS:  1. Toprol-XL 50 mg twice a day.  2. Aspirin 325 mg a day.  3. Nexium 40 mg a day.  4. Crestor 20 mg a day.  5. Fish oil once a day.  6. Vitamin D once a day.  7. Lasix 40 mg a day.  8. Potassium chloride 20 mEq a day.  9. Imdur 60 mg a day.   ALLERGIES:  She is allergic to SULFA, COMPAZINE, and she is intolerant  to NIASPAN.   PAST MEDICAL HISTORY:  1. Coronary artery disease.  She is status post recent redo coronary      artery bypass grafting by Dr. Purcell Nails.  2. Postsurgical recurrent pleural effusion.  3. Hyperlipidemia.  4. History of peripheral vascular disease - status post right carotid      endarterectomy.  5. Gastroesophageal reflux disease.   SOCIAL HISTORY:  The patient is a nonsmoker and nondrinker.   FAMILY HISTORY:  Positive for coronary artery disease.   REVIEW OF SYSTEMS:  Reviewed.  Mostly noted in  the HPI.  All other  systems are negative.   PHYSICAL EXAMINATION:  GENERAL:  She is a middle-aged female in no acute  distress.  She is alert and oriented x3 and her mood and affect are  normal.  VITAL SIGNS:  Weight is 144, blood pressure is 120/60 with a heart rate  of 76.  NECK:  2+ carotids.  She has no bruits, no JVD, no thyromegaly.  She has  a well-healed right carotid endarterectomy scar.  Her neck is supple.  HEENT:  Her sclerae are nonicteric.  Her mucous membranes are moist.  LUNGS:  Clear.  HEART:  Regular rate.  S1 and S2.  ABDOMEN:  Good bowel sounds and it is nontender.  EXTREMITIES:  She has no clubbing, cyanosis, or edema.  NEUROLOGIC:  Nonfocal.  Gait is normal.  SKIN:  Without rashes.   Camala's cardiac  enzymes from Wednesday are negative despite the fact that  she had prolonged episode of chest discomfort.   IMPRESSION AND PLAN:  Chest discomfort.  The patient presents with  episodes of chest tightness.  She has had numerous episodes of chest  pain and shortness breath.  We will admit her for heart catheterization.  We have discussed the risks, benefits, and options of heart  catheterization.  She understands and agrees to proceed.      Vesta Mixer, M.D.  Electronically Signed     PJN/MEDQ  D:  09/03/2008  T:  09/04/2008  Job:  [id number]   cc:   Teena Irani. Arlyce Dice, M.D.  Salvatore Decent. Cornelius Moras, M.D.

## 2010-10-10 NOTE — Assessment & Plan Note (Signed)
OFFICE VISIT   [name], [name]  DOB:  [birth year]                                        July 26, 2008  CHART #:  [id number]   The patient returns for followup status post redo coronary artery bypass  grafting x3 on July 06, 2008.  Her postoperative recovery has been  slow to say the least.  After her initial hospital discharge on February  17, 20010, she was readmitted with severe anxiety, depression, and  shortness of breath with a small-to-moderate-sized right pleural  effusion and fluid overload.  She was treated with diuretics and  underwent needle thoracentesis.  Her shortness of breath improved.  She  has continued to have problems with acute-on-chronic anxiety and  depression that has severely limited her postoperative recovery.  Ultimately, she was discharged home.  She returns to the office today.  She remained tearful and anxious.  She states that she still is not  sleeping at night due to her anxiety.  She has not really had much  shortness of breath, although she admits that she still has some.  It  does not sound like she is ambulating much at home.  Her appetite has  remained poor, but she claims to be eating.  She clearly remains  severely limited by her underlying chronic anxiety and depression.   Current medications remained unchanged from the time of hospital  discharge.   PHYSICAL EXAMINATION:  GENERAL:  Notable for an elderly female who is  tearful and upset.  VITAL SIGNS:  Blood pressure 104/70, oxygen saturation 98% on room air,  pulse 80 and regular.  CHEST:  A median sternotomy incision that is healing nicely.  The  sternum is stable on palpation.  Auscultation of the chest demonstrates  diminished breath sounds at the right lung base.  LUNGS:  Fields are otherwise clear.  CARDIOVASCULAR:  Notable for regular rate and rhythm.  No murmurs, rubs,  or gallops are noted.  ABDOMEN:  Soft, nontender.  EXTREMITIES:  Warm and well  perfused.  Small incision from endoscopic  vein harvest from her left thigh are healing nicely.  There is no lower  extremity edema.  The remainder of physical exam is unremarkable.   DIAGNOSTIC TEST:  Chest x-ray obtained today at the [facility] is reviewed.  This demonstrates some recurrence of small-to-  moderate-sized right pleural effusion with associated right lower lobe  atelectasis.  The lung fields are otherwise clear.  No other  abnormalities are noted.   IMPRESSION:  The patient continues to struggle.  She does have some  recurrence of her pleural effusion, and she might enjoy some symptomatic  relief from repeat thoracentesis, although her breathing does not appear  to be much of a problem.  She is not in fluid overload anymore  otherwise.  She remains severely limited by her underlying acute-on-  chronic anxiety and depression.   PLAN:  We will arrange from the patient to undergo ultrasound-guided  needle thoracentesis of her right pleural effusion.  We have not made  any changes in her current medications.  She continues to use Xanax at  bedtime.  She states that this helped some, but it does not last very  long.  I think, she needs professional help for long-term attention to  her underlying chronic anxiety and depression.  She apparently has been  given a name for referral to a psychiatrist here in town by Dr. Elease Hashimoto,  although she does not recall that physician's name at present.  She has  also seen Dr. Rudi Heap in the past for long-term attention to her  basic medical needs.  I have suggested that she find a way to get an  appointment with one of those 2 physicians as soon as possible.  We will  plan to see her back in 2 weeks with a repeat chest x-ray.  She is  scheduled to see Dr. Elease Hashimoto in the office next week.   [name], M.D.  Electronically Signed   CHO/MEDQ  D:  07/26/2008  T:  07/26/2008  Job:  [id number]   cc:   Ernestina Penna,  M.D.  Vesta Mixer, M.D.

## 2010-10-10 NOTE — Procedures (Signed)
NAMEDIAN, LAPRADE NO.:  [id number]   MEDICAL RECORD NO.:  [id number]          PATIENT TYPE:  OUT   LOCATION:  SLEEP CENTER                 [facility]ACILITY[facility]   PHYSICIAN:  Clinton D. Maple Hudson, MD, FCCP, FACPDATE OF BIRTH:  05-25-1931   DATE OF STUDY:  09/25/2007                            NOCTURNAL POLYSOMNOGRAM   REFERRING PHYSICIAN:  Clinton D. Maple Hudson, MD, FCCP, FACP   REFERRING PHYSICIAN:  Clinton D. Young, MD, FCCP, FACP   INDICATION FOR STUDY:  Hypersomnia with sleep apnea.   EPWORTH SLEEPINESS SCORE:  8/24.  BMI 25.2, weight 147 pounds, height 64  inches, neck 14 inches.   MEDICATIONS:  Home medications charted and reviewed.   SLEEP ARCHITECTURE:  Split study protocol.  During the diagnostic phase,  total sleep time 213 minutes with sleep deficiency 84.4%.  Stage I was  6.1%, stage II 60.7%, stage III 15.5%.  REM 17.8% of total sleep time.  Sleep latency 4 minutes, REM latency 114 minutes.  Awake after sleep  onset 35 minutes, arousal index 13.2.  No bedtime medication was taken.   RESPIRATORY DATA:  Split study protocol.  Apnea/hypopnea index (AHI) 11  per hour, indicating mild obstructive sleep apnea/hypopnea syndrome  before CPAP.  There were 39 events, including 5 obstructive apneas and  34 hypopneas before CPAP.  Events were not positional.  REM  AHI 1.6.  CPAP was titrated to 5 CWP, AHI 5 CWP.  The technician had to stop CPAP  titration because the patient said it was terrifying and she began  crying.  The study was finished as a diagnostic NPSG protocol.  She had  chosen a small Quattro full-face mask with heated humidifier.   OXYGEN DATA:  Moderate snoring with oxygen desaturation to a nadir of  80%.  Mean oxygen saturation through the study was 94.1% on room air.   CARDIAC DATA:  Normal sinus rhythm.   MOVEMENT-PARASOMNIA:  No significant movement disturbance.  Bathroom x2.   IMPRESSIONS-RECOMMENDATIONS:  1. Mild obstructive sleep  apnea/hypopnea syndrome, AHI 11 per hour      with non-positional events, moderate snoring and oxygen      desaturation to a nadir of 80%.  2. Unsuccessful CPAP titration.  The patient was not adequately      desensitized and found the experience      frightening.  She had been titrated to a CPAP of 5 CWP, wearing a      small Quattro full-face mask with heated humidifier.  CPAP      titrating was discontinued at patient request.      Clinton D. Maple Hudson, MD, [facility], FACP  Diplomate, Biomedical engineer of Sleep Medicine  Electronically Signed     CDY/MEDQ  D:  09/27/2007 13:09:09  T:  09/27/2007 13:43:13  Job:  [id number]

## 2010-10-10 NOTE — H&P (Signed)
[name], [name] [name].:  [id number]   MEDICAL RECORD [name].:  [id number]          PATIENT TYPE:  EMS   LOCATION:  MAJO                         FACILITY:  [facility]   PHYSICIAN:  Isidor Holts, M.D.  DATE OF BIRTH:  1928/07/05   DATE OF ADMISSION:  05/15/2008  DATE OF DISCHARGE:                              HISTORY & PHYSICAL   PRIMARY MEDICAL DOCTOR:  Ernestina Penna, M.D.   PRIMARY CARDIOLOGIST:  Vesta Mixer, M.D.   PRIMARY PULMONOLOGIST:  Rennis Chris. Maple Hudson, MD, FCCP, FACP.   CHIEF COMPLAINT:  Left arm and neck pain for 2 days.   HISTORY OF PRESENT ILLNESS:  This is a 77 year old female, an excellent  historian.  For past medical history, see below.  According to the  patient, she had just finished putting up and decorating a Christmas  tree at about 5:40 p.m. on May 14, 2008.  Sat in a chair  afterwards, when she developed deep steady left upper arm pain  radiating to the neck.  She fell mildly short of breath and nauseated,  but denies diaphoresis.  She took some sublingual Nitroglycerin with  partial relief.  Eventually went to bed at 10:00 p.m. on May 14, 2008.  However, at about 11:00 p.m. she was woken up by the [facility], who came by to investigate an alarm that had gone off in the  home.  Since then, the patient was unable to get comfortable with the  same pain radiating to the back.  This pain persisted all night and she  had to take several sublingual Nitrates.  As a matter of fact by the  a.m. of May 15, 2008 she had taken a total of seven sublingual  Nitroglycerin pills.  She called Dr. Harvie Bridge office in a.m. of May 15, 2008 and eventually spoke to Dr. Armanda Magic, who referred her to  the emergency department.  Her pain was finally relieved by Morphine  administered in the emergency department at [facility].   PAST MEDICAL HISTORY:  1. Coronary artery disease status post anterior wall MI.  2. Status  post CABG 1997 (Dr. Laneta Simmers).  3. Status post cardiac catheterization May 29, 2004, which showed      all three patent grafts and an atretic LIMA. LAD filled retrograde.  4. Status post negative stress Cardiolite April 18, 2008.  5. Known left bundle branch block pattern on EKG.  6. Dyslipidemia.  7. Peripheral vascular disease, status post bilateral carotid      endarterectomy.  8. Hypertension.  9. Diverticulosis.  10.Status post partial colon resection for abcess.  11.Status post appendectomy.  12.Remote history of endometriosis.  13.Mild obstructive sleep apnea/hypopnea syndrome, confirmed by sleep      study on September 25, 2007 (Dr. Jetty Duhamel).   MEDICATION HISTORY.:  1. Isosorbide mononitrate 40 mg p.o. daily.  2. Toprol XL 50 mg p.o. daily.  3. Nexium 40 mg p.o. daily.  4. Crestor 40 mg p.o. daily.  5. Vitamin C.  1000 mg p.o. daily.  6. Vitamin E D-alpha  1 p.o. daily.  7. Aspirin 81 mg p.o. daily.  8. Fish oil 1 tablet p.o. daily.   ALLERGIES:  COMPAZINE AND SULFA.   REVIEW OF SYSTEMS:  As per HPI and chief complaint.  Denies chest pain.  Denies respiratory tract symptoms.  Denies abdominal pain, vomiting or  diarrhea.  Denies fever or chills.   SOCIAL HISTORY:  The patient is a retired Runner, broadcasting/film/video.  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  She has never smoked, very  rarely uses alcohol.  She is married, has [name] offspring.  Her husband is  a Geophysicist/field seismologist.   FAMILY HISTORY:  Is positive for a mother that died status post MI at  age 9 years.  Father died status post MI at 25 years.  She had a sister  who died status post CVA.   PHYSICAL EXAMINATION:  VITALS:  Temperature 97.4, pulse 77 per minute,  regular.  Respiratory rate 16, BP 124/77 mmHg.  Rechecked 103/48 mm/Hg.  Pulse oximeter 99% on room air.  The patient did not appear to be in  obvious acute distress at time of this evaluation.  Alert,  communicative, not short of breath at  rest.  HEENT:  [name] clinical pallor or jaundice.  [name] conjunctival injection.  Throat is clear.  NECK:  Supple JVP not seen.  [name] palpable lymphadenopathy.  [name] palpable  goiter.  [name] carotid bruits.  CHEST:  Clinically clear to auscultation.  [name] wheezes or crackles.  HEART:  Sounds 1 and 2 heard, normal, regular.  [name] murmurs.  ABDOMEN:  Full, soft and nontender.  [name] palpable organomegaly.  [name]  palpable masses.  Normal bowel sounds.  LOWER EXTREMITY EXAMINATION:  [name] pitting edema.  Palpable peripheral  pulses.  MUSCULOSKELETAL SYSTEM.  The patient has obvious osteoarthritic changes.  She has [name] tenderness on palpation of the left upper extremity.  Full  range of motion is observed in the left shoulder.  The patient however  has some localized tenderness left subscapular region, as well as some  discomfort to pressure along the course of the upper thoracic spine.  She also experiences some pain on lateral rotation of the torso.  There  is also some restriction of neck movements on lateral rotation.  CENTRAL NERVOUS SYSTEM:  [name] focal neurologic deficit on gross  examination.   INVESTIGATION:  CBC:  WBC 7.3, hemoglobin 13.2, hematocrit 39.3,  platelets 238.  Electrolytes:  Sodium 136, potassium 4.1, chloride 101,  CO2 - 28, BUN 13, creatinine 0.79.  Glucose 102.  AST 24, ALT 20,  alkaline phosphatase 59.  BNP 363.  D-dimer 0.31.  Troponin I at point  of care less than 0.05, rechecked less than 0.05.  Chest x-ray dated  May 14, 2008 shows stable borderline cardiomegaly.  Mild COPD  changes.  [name] acute abnormalities.  12-lead EKG dated May 14, 2008  showed sinus rhythm regular, 64 per minute.  Left axis deviation, left  bundle branch block pattern.   ASSESSMENT AND PLAN:  1. Left arm/neck pain.  This is atypical for coronary artery disease.      Pain has been constant for several hours, commenced after putting      up and decorating a Christmas tree, is incompletely relieved by       sublingual Nitroglycerin, although responsive to intravenous      opioids.  The patient has localized tenderness along the left      subscapular region and some pain  to lateral rotation of the torso,      although there is full range of motion of her shoulder.  I suspect      a musculoskeletal/ radiculopathic etiology, secondary to cervical      and thoracic degenerative joint disease.  However, the patient does      have a complex cardiac history and obvious risk factors.  We shall      therefore start NSAIDS, do MRI of the neck and thoracic spine.      Complete cycling cardiac enzymes and consult Cardiology.  Meanwhile      we shall continue Nitropaste which has been commenced in the      emergency department.  Continue pre-admission dose of beta blockers      and Aspirin.   1. Dyslipidemia.  We shall continue the patient's pre-admission statin      for now.  Check TSH and lipid profile.   1. Hypertension.  This appears controlled.   Further management will depend on clinical course.      Isidor Holts, M.D.  Electronically Signed     CO/MEDQ  D:  05/15/2008  T:  05/15/2008  Job:  [id number]   cc:   Ernestina Penna, M.D.  Clinton D. Maple Hudson, MD, FCCP, FACP  Vesta Mixer, M.D.

## 2010-10-10 NOTE — Assessment & Plan Note (Signed)
OFFICE VISIT   [name], [name]  DOB:  1929/05/19                                        September 14, 2008  CHART #:  [id number]   The patient returns to the office today for further followup, status  post redo coronary artery bypass grafting x3 on July 06, 2008.  She  was last seen here in the office on August 02, 2008.  Since then, she has  overall continued to improve.  She did have an episode of left-sided  chest pain, which she thought was similar to the chest pain that she had  prior to her surgery.  This prompted her to be reevaluated by Dr.  Elease Hashimoto.  All of her electrocardiograms and cardiac enzymes were  reportedly negative.  She underwent cardiac catheterization  demonstrating that all 3 of her recent bypass grafts were patent and  functioning normally.  Left ventricular ejection fraction had increased  from the time of its assessment prior to surgery.  She was started on  oral isosorbide mononitrate at that time, and she has had no further  episodes of chest pain.  Overall, the patient has improved substantially  since the last time I had seen her.  She has minimal soreness in her  chest.  She reports no shortness of breath.  Her activity level has  gradually improved.  She still has problems with chronic anxiety, but  she and her husband tell me that they have made some headway with this  through therapy.   PHYSICAL EXAMINATION:  GENERAL:  A well-appearing female.  VITAL SIGNS:  Blood pressure 129/73, pulse 80, and oxygen saturation is  98% on room air.  CHEST:  Her median sternotomy incision has healed almost completely and  the sternum is stable on palpation.  LUNGS:  Breath sounds are clear to auscultation.  No wheezes or rhonchi  are noted.  CARDIOVASCULAR:  Regular rate and rhythm.  ABDOMEN:  Soft and nontender.  EXTREMITIES:  Warm and well perfused.  There is no lower extremity  edema.  The small incision in the left lower leg and thigh  from  endoscopic vein harvest has healed nicely.  The remainder of her  physical exam is unrevealing.   DIAGNOSTIC TEST:  Chest x-ray performed at the [facility]  today is reviewed.  This demonstrates substantial further decreased size  of the small loculated right pleural effusion.  There is now only very  small amount of pleural fluid remaining at this time.  The lung fields  are otherwise clear.   IMPRESSION:  Near complete resolution of the previous loculated right  pleural effusion.  The patient is otherwise gradually getting better.   PLAN:  In future, the patient will call and return to see [place] here at  [facility] only should further problems or  difficulties arise.  All of their questions have been addressed.  I have  advised her that she can continue to increase her physical activity as  tolerated without any particular limitation other than the fact that I  would avoid heavy lifting or strenuous use of her arms or shoulders for  another 4-6 weeks.  Otherwise, I have encouraged her to try to start  getting back into more normal physical activity to include participation  in the cardiac rehab program.  Salvatore Decent. Cornelius Moras, M.D.  Electronically Signed   CHO/MEDQ  D:  09/14/2008  T:  09/15/2008  Job:  [id number]   cc:   Vesta Mixer, M.D.  Ernestina Penna, M.D.

## 2010-10-10 NOTE — Consult Note (Signed)
NEW PATIENT CONSULTATION   LOCKIE, BOTHUN B  DOB:  May 07, 1929                                        June 18, 2008  CHART #:  [id number]   REASON FOR CONSULTATION:  Severe native three-vessel coronary artery  disease and saphenous vein graft occlusions, status post coronary artery  bypass graft surgery in 1997.   CLINICAL HISTORY:  This patient is a 77 year old woman, who underwent  coronary artery bypass graft surgery x5 by me on April 22, 1996.  She  had a left internal mammary graft to the LAD with a saphenous vein graft  to diagonal branch of the LAD, a sequential saphenous vein graft to the  first and second obtuse marginal branches, and saphenous vein graft to  the right coronary artery.  She did fairly well, but had some chest  discomfort in 2006 and underwent cardiac catheterization in January 2006  that shows all of her vein grafts were widely patent.  Her left internal  mammary artery was atretic at that time, but her diagonal vein graft was  back filling the LAD without any difficulty.  Her LAD was occluded  proximally at that time.  She was continued on medical therapy.  She is  continued to have intermittent episodes of chest pain.  Her stress test  in January 2009 showed normal left ventricular systolic function and no  anterior wall ischemia.  Over the last fall, she continued to have  shortness of breath as well as fatigue.  She continued to have  intermittent episodes of exertional substernal chest pain as well as  pain in her left shoulder and arm, and some in her back and neck.  She  was admitted in late December with these symptoms and cardiac enzymes  were negative.  She had poor R-wave progression on her EKG.  She did  have an MRI of her neck since she had some lower spine disease and this  showed multiple spondylosis, but no evidence of compression fracture.  She is continued on medical therapy and states she did fairly well until  she  got stressed around Christmas and had worsening symptoms.  She  subsequently underwent a stress Cardiolite exam on June 11, 2008,  which was abnormal showing a large anterior apical defect with very  little if any reversibility.  She then underwent cardiac catheterization  on June 14, 2008, which showed about 30-40% ostial left main  stenosis.  The LAD was occluded after a long 80% proximal stenosis.  There were several large septal branches which filled well.  The distal  LAD filled via left-to-left collaterals.  The left circumflex was a  large vessel.  Her obtuse marginal was occluded, but filled by a  saphenous vein graft which was highly diseased with 20-30% proximal  stenosis and a 80-90% mid graft stenosis.  The distal left circumflex  terminated as a large posterior descending artery that was widely  patent.  The right coronary artery was small and occluded proximally.  The saphenous vein graft to the right coronary artery was occluded.  There were faint collaterals from the left filling the distal right  coronary artery.  The left internal mammary was known to be chronically  atretic.  Left ventricular ejection fraction was about 35-40% with  anterior apical akinesis to dyskinesis.   REVIEW OF SYSTEMS:  Her review of systems is as follows.  General:  She  denies any fever or chills.  She has had no recent weight changes.  She  does report fatigue over the past several months.  Eyes:  Negative.  ENT:  Negative.  Endocrine:  She denies diabetes and hypothyroidism.  Cardiovascular:  As above.  She denies PND or orthopnea.  She has had no  peripheral edema.  She denies palpitations.  Respiratory:  She denies  cough and sputum production.  GI:  She has had no nausea or vomiting.  She denies melena and bright red blood per rectum.  GU:  She denies  dysuria and hematuria.  Musculoskeletal:  She denies arthralgias and  myalgias.  Neurological:  She denies any focal weakness.  She  has had  some numbness in her right arm at rest, does not relate to any activity.  She has never had TIA or stroke.  She is status post right carotid  endarterectomy in the past.  Vascular:  She has a history of peripheral  vascular disease.  She denies any claudication or phlebitis.  Allergies  to sulfa and Compazine.  She is intolerant to nystatin.  Psychiatric:  She does have a recent history of anxiety disorder.   SOCIAL HISTORY:  She is retired and lives with her husband, who is a  Education officer, environmental.  She is a nonsmoker.  Denies alcohol use.   Her family history is strongly positive for coronary artery disease.   Her past medical history is significant for coronary artery disease as  mentioned above.  She has a history of dyslipidemia.  She has history of  asthma.  She has history of peripheral vascular disease, status post  right carotid endarterectomy on August 14, 1993.  She has history of  diverticulitis and colon resection in the past.   Her medications are Nexium 40 mg daily, Toprol-XL 50 mg daily,  isosorbide 60 mg daily, Crestor 20 mg daily, aspirin 81 mg daily, fish  oil 1000 mg daily, vitamin D 1000 units daily, Ester-C 1000 units daily,  and multivitamin daily.   PHYSICAL EXAMINATION:  GENERAL:  She is a well-developed, elderly white  female, who is obviously anxious.  VITAL SIGNS:  Blood pressure 152/84, pulse is 94 and regular.  Respiratory rate is 18, unlabored.  HEENT:  Normocephalic and atraumatic.  Pupils are equal and reactive to  light and accommodation.  Extraocular muscles are intact.  Her throat is  clear.  Teeth are in good condition.  NECK:  Normal carotid pulses bilaterally.  There are no bruits.  There  is a right cervical scar from carotid endarterectomy.  There is no  adenopathy or thyromegaly.  CARDIAC:  Regular rate and rhythm with normal S1 and S2.  There is no  murmur, rub, or gallop.  LUNGS:  Clear.  CHEST:  There is a well-healed sternotomy incision.   Sternum is stable.  ABDOMEN:  Active bowel sounds.  Abdomen is soft and nontender.  There  are no palpable masses or organomegaly.  EXTREMITIES:  No peripheral edema.  Pedal pulses are palpable  bilaterally.  There is an old right saphenous vein graft harvest  incision from the ankle to the mid thigh.  Upper extremity exam shows  strongly palpable radial pulses bilaterally.  NEUROLOGIC:  Alert and oriented x3.  Motor and sensory exams grossly  normal.   Laboratory examination shows normal electrolytes with BUN of 11 and  creatinine of 0.82.  Her white blood  cell count is 7.5, hemoglobin 13.3,  and platelet count of 203,000.  Coagulation profile is within normal  limits.   IMPRESSION:  The patient has severe native three-vessel coronary artery  disease with occlusion of 2 of her 3 saphenous vein grafts with high-  grade stenosis in the third patent saphenous vein graft and an atretic  left internal mammary graft.  She has had a fairly large anterior apical  infarct at some point in the not too distant past and has a large  anterior apical defect on Cardiolite stress test with minimal  reversibility.  Her thallium exam does show a large area scar with peri-  infarct viability.  Her symptoms are very suggestive of angina.  I think  her best long-term prognosis and hope for improved symptoms would be to  proceed with redo coronary artery bypass surgery.  Her left anterior  descending looks graftable and she may have some viable myocardium  remaining in this territory.  I cannot see her diagonal well enough to  tell whether that would be suitable for grafting.  She has high-grade  stenosis in the obtuse marginal vein grafts and this definitely should  be re-grafted.  Her right coronary artery is occluded as is her right  coronary vein graft, but I cannot see the distal vessel well enough to  tell whether this is graftable.  I discussed the operative procedure of  a redo coronary artery  bypass surgery with her and her husband.  We  discussed alternatives including continued medical therapy in which case  I think she will likely occlude her obtuse marginal vein graft and have  another significant myocardial infarction.  We discussed the benefits  and risks of surgery including but not limited to bleeding, blood  transfusion, infection, stroke, myocardial infarction, graft failure,  heart failure, and death.  She understands all this and would like to  proceed with surgery.  We will tentatively plan this for July 28, 2008,  whenever returns from [facility].  She will contact our office as well  as that of Dr. Elease Hashimoto if she develops any worsening symptoms.  We will  get upper extremity Dopplers to decide if a radial artery could be used  if conduit is a problem.  She does have saphenous vein in her left leg  which hopefully will be suitable to use as a conduit.  We could  potentially use her right internal mammary graft, but this would have to  be used as a free graft when we put to her left anterior descending.  She understands all this and agrees to proceed.  I will plan to see her  back in the week prior to her planned surgery to review things and  discuss things further with her and her husband.   Evelene Croon, M.D.  Electronically Signed   BB/MEDQ  D:  06/18/2008  T:  06/19/2008  Job:  [id number]   cc:   Vesta Mixer, M.D.

## 2010-10-10 NOTE — H&P (Signed)
NAMETYLICIA, SHERMAN NO.:  [id number]   MEDICAL RECORD NO.:  [id number]          PATIENT TYPE:  INP   LOCATION:  3731                         FACILITY:  [facility]   PHYSICIAN:  Vesta Mixer, M.D. DATE OF BIRTH:  1928-07-16   DATE OF ADMISSION:  05/15/2008  DATE OF DISCHARGE:  05/17/2008                              HISTORY & PHYSICAL   Summit Borchardt is an elderly female with a history of coronary artery  disease.  She is status post coronary artery bypass grafting.  She  presents today for heart catheterization after having an abnormal  Cardiolite study.   Shemia had a history of CAD and CABG.  She had bypass grafting.  She had  coronary artery bypass grafting in 1997.  Her last heart catheterization  in January 2006 revealed that all of her vein grafts were patent, but  that her IMA was atretic.  The LAD did feel retrograde via the saphenous  vein graft to the diagonal vessel and will be continued medical therapy.  She has had intermittent episodes of chest pain.  Her last stress  Cardiolite study was in January 2009, which revealed normal left  ventricular systolic function and no anterior wall ischemia.   Over this past fall, she has been more and more short of breath.  She  has had some intermittent episodes of chest pain and arm pain.  She was  admitted for chest pain and arm pain in late December, but her cardiac  enzymes were all negative at that time.  She did have some poor R-wave  progression noted at that time.   The patient had an MRI of her neck revealed multiple spondylosis and no  evidence of compression fracture.   The patient did fairly well, but then on Christmas became stressed out  from the holidays.  She had worsening chest pain and shortness of  breath.  She presented today for a stress Cardiolite study and was  called back when the results were abnormal.  The Cardiolite study  reveals a large anteroapical defect with very little if any  reversibility.   Her ejection fraction is now around 42%.   CURRENT MEDICATIONS:  1. Toprol-XL 50 mg a day.  2. Aspirin 81 mg a day.  3. Nexium 40 mg a day.  4. Isosorbide 60 mg a day.  5. Fish oil once a day.  6. Vitamin D once a day.   ALLERGIES:  She is allergic to SULFA and COMPAZINE.  She is intolerant  to NYSTATIN.   PAST MEDICAL HISTORY:  1. Coronary artery disease - status post coronary artery bypass      grafting.  2. Dyslipidemia.  3. Partial colectomy.  4. History of asthma.  5. Peripheral vascular disease - status post right carotid      endarterectomy.   SOCIAL HISTORY:  The patient is a nonsmoker and nondrinker.   FAMILY HISTORY:  Positive for coronary artery disease.   REVIEW OF SYSTEMS:  She has had some chest pain, shortness of breath for  the past several months.  She had a hospitalization which was negative  for an infarct.  She denies any PND or orthopnea.  She denies any nausea  or vomiting.  She denies any syncope or presyncope.  All other systems  were reviewed and are negative.   PHYSICAL EXAMINATION:  GENERAL:  She is an elderly female in no acute  distress.  She is alert and oriented x3.  PSYCHIATRIC:  Her mood and affect are normal.  VITAL SIGNS:  Her weight is 152, blood pressure is 130/74 with a heart  rate of 68.  NECK:  A 2+ pupils carotids.  She has no bruits.  She has a right  carotid endarterectomy scar.  There is no JVD in her neck.  HEENT:  She is normocephalic and atraumatic.  LUNGS:  Clear.  HEART:  Regular rate, S1 and S2.  ABDOMEN:  Good bowel sounds and is nontender.  EXTREMITIES:  She has no clubbing, cyanosis, or edema.  Her pulses are  1+.  SKIN:  Without rashes.  NEUROLOGIC:  Her gait is normal.   ASSESSMENT AND PLAN:  Korayma presents with some intermittent episodes of  chest pain.  She has a stress Cardiolite study that appears that she had  an anterior apical myocardial infarction.  It is quite possible that the   saphenous vein graft to the diagonal artery is now close or perhaps the  small anastomosis leading back to the LAD is now closed.  We have  scheduled her for heart catheterization.  We have discussed the risks,  benefits, and options of heart catheterization.  She understands and  agrees to proceed.      Vesta Mixer, M.D.  Electronically Signed     PJN/MEDQ  D:  06/11/2008  T:  06/12/2008  Job:  [id number]   cc:   Ernestina Penna, M.D.

## 2010-10-10 NOTE — Cardiovascular Report (Signed)
NAMERENLY, GUEDES NO.:  [id number]   MEDICAL RECORD NO.:  [id number]          PATIENT TYPE:  OIB   LOCATION:  1962                         FACILITY:  [facility]   PHYSICIAN:  Vesta Mixer, M.D. DATE OF BIRTH:  1928/07/27   DATE OF PROCEDURE:  06/14/2008  DATE OF DISCHARGE:  06/14/2008                            CARDIAC CATHETERIZATION   [name] is a 77 year old female with a history of coronary artery  disease and peripheral vascular disease.  She is status post coronary  artery bypass grafting in 1997.  Her last heart catheterization was in  2006.  At that time, she was found to have an atretic left internal  mammary artery, both patent saphenous vein graft to the obtuse marginal  artery, the diagonal artery and the right coronary artery.  The left  anterior descending artery fills in a retrograde fashion from the  diagonal graft.   The patient has had some episodes of chest pain and fatigue recently.  She was admitted to the hospital with negative cardiac enzymes.  She  continued to have episodes of chest pain and a stress Cardiolite study  revealed a fixed defect in the anteroapical wall.  She is referred for  heart catheterization for further evaluation.   PROCEDURE:  Left heart catheterization with coronary angiography.   The right femoral artery was easily cannulated using modified Seldinger  technique.   HEMODYNAMIC:  The LV pressure is 145/13 with an end-diastolic pressure  of 22, the aortic pressure is 145/50.   ANGIOGRAPHY:  Left main.  The left main has a 30-40% stenosis at its  origin.   The left anterior descending artery has a long 80% stenosis proximally  and is then occluded.  There are several large septal branches which  fill fairly well.   The distal LAD fills via left-to-left collaterals.   The left circumflex artery is quite large and is either dominant or  codominant.  There are no significant obtuse marginal arteries and in  fact they are known to be included.  The large posterior descending  artery is widely patent.  There are perhaps few to little minor  irregularities.   The right coronary artery is small and is occluded.   The saphenous vein graft to the right coronary artery is occluded.  The  saphenous vein graft to the diagonal artery is occluded.  The saphenous  vein graft to the obtuse marginal artery has a proximal 20-30% stenosis  and a mid 80-90% stenosis.  The anastomosis to the obtuse marginal  artery is fairly normal and the obtuse marginal artery has only minor  luminal irregularities.   The left internal mammary artery was noted to be atretic.  Nonselective  angiogram of the subclavian reveals a very poor distal flow down the  LIMA.   The right internal mammary artery was nonselectively engaged from the  right innominate artery.  It reveals a patent RIMA with brisk flow.   The left ventriculogram was performed in the 30 RAO position.  It  reveals anteroapical akinesis/dyskinesis.  The ejection fraction is  perhaps 35-40%.   COMPLICATIONS:  None.   CONCLUSIONS:  Significant coronary artery disease.  She has occlusion of  the saphenous vein graft to the diagonal artery and the saphenous vein  graft to the right coronary.  Her left internal mammary artery is known  to be atretic from years past.  She has significant disease in the graft  to the obtuse marginal artery and we will need to do a resting and  delayed thallium scan to see if she has viable myocardium in the left  anterior descending artery distribution.  The vessel still fills slowly  via collaterals.  The right coronary artery is also occluded and fills  very slowly via left-sided collaterals as well.  We will consider redo  coronary artery bypass grafting if she has viable myocardium.      Vesta Mixer, M.D.  Electronically Signed     PJN/MEDQ  D:  06/14/2008  T:  06/14/2008  Job:  [id number]   cc:   Evelene Croon,  M.D.  Ernestina Penna, M.D.

## 2010-10-10 NOTE — H&P (Signed)
NAMEMASIEL, GENTZLER NO.:  [id number]   MEDICAL RECORD NO.:  [id number]          PATIENT TYPE:  INP   LOCATION:  2924                         FACILITY:  [facility]   PHYSICIAN:  Vesta Mixer, M.D. DATE OF BIRTH:  January 13, 1929   DATE OF ADMISSION:  07/02/2008  DATE OF DISCHARGE:                              HISTORY & PHYSICAL   [name] is a 77 year old female with a history of coronary artery  disease.  She is admitted today with episodes of chest pain.   [name] has a history of CABG.  She had bypass grafting in 1997.  She  had a heart catheterization on June 14, 2008, which revealed that her  IMA was atretic (this was known from previous heart catheterizations).  She has an occlusion of the saphenous vein graft to the diagonal artery  and occlusion of the saphenous vein graft to the right coronary artery.  The saphenous vein graft to the obtuse marginal artery was significantly  stenosed to about 80-90% in its mid segment.  The patient had had  evidence of a recent anterior wall myocardial infarction.  Ejection  fraction was around 35-40%.  There is anterior apical  akinesis/dyskinesis.   The patient was seen by Dr. Laneta Simmers on June 18, 2008, for evaluation.  She was scheduled to have surgery on July 28, 2008.  Dr. Laneta Simmers was  going to be covering at the [facility].   [name] started having episodes of chest pain.  She took three  nitroglycerin with some relief.  She woke up this morning and continued  to have further episodes of chest pain.  She presents to the emergency  room for further evaluation.   She presented to the emergency room and received additional  nitroglycerin and is now painfree.   She denies any PND or orthopnea.  She denies any weight gain or weight  loss.  She denies any syncope or presyncope.  She denies any  diaphoresis.  There is no radiation of her pain.  It is deep and  substernal and lasts for  5-10 minutes.  It is associated with some  shortness of breath.   Current medications are Toprol-XL 50 mg a day, aspirin 81 mg a day,  Nexium 40 mg a day, Imdur 60 mg a day, fish oil once a day, and vitamin  D once a day.  She is allergic to SULFA and COMPAZINE.  She is  intolerant to Niaspan.   PAST MEDICAL HISTORY:  1. She is status post CABG.  2. Dyslipidemia.  3. History of partial colectomy.  4. History of asthma.  5. History of peripheral vascular disease - status post right carotid      endarterectomy.   SOCIAL HISTORY:  The patient is a nonsmoker and nondrinker.   FAMILY HISTORY:  Positive for coronary artery disease.   REVIEW OF SYSTEMS:  Reviewed in the history of present illness.  All  other systems were reviewed and are negative.   PHYSICAL EXAMINATION:  GENERAL:  She is an elderly female in no  acute  distress.  She is alert and oriented x3 and her mood and affect are  normal.  She is currently painfree.  VITAL SIGNS:  Her heart rate is 77, respirations are 16.  Her blood  pressure 120/58.  HEENT:  Right carotid endarterectomy scar.  She is normocephalic and  atraumatic.  Her pupils were equally round and reactive to light.  NECK:  She has 2+ carotids.  She has no bruits.  There is no JVD.  There  is no thyromegaly.  LUNGS:  Clear.  BACK:  Nontender.  HEART:  Regular rate S1 and S2.  There is no S3 gallop.  There is no  significant murmur.  ABDOMEN:  Good bowel sounds and nontender.  EXTREMITIES:  No clubbing, cyanosis, or edema.  NEUROLOGIC:  Nonfocal.  SKIN:  Intact.  Cranial nerves II through XII are intact and motor and  sensory functions are intact.   Laboratory data is pending.   EKG reveals normal sinus rhythm.  She has a left bundle-branch block.  She has evidence of the previous anterior wall myocardial infarction.  There is associated ST and T wave changes.   [name] presents with some episodes of chest discomfort.  I suspect that she  is having some  angina from the narrowed saphenous vein graft.  We will  collect cardiac enzymes.  We will put her on heparin and nitroglycerin  and hopefully she will be stable.  We will anticipate that she will need  a redo coronary artery bypass grafting next week.  All of her other  medical problems are stable.      Vesta Mixer, M.D.  Electronically Signed     PJN/MEDQ  D:  07/02/2008  T:  07/03/2008  Job:  [id number]   cc:   Evelene Croon, M.D.  Ernestina Penna, M.D.

## 2010-10-10 NOTE — Consult Note (Signed)
[name], [name] NO.:  [id number]   MEDICAL RECORD NO.:  [id number]          PATIENT TYPE:  INP   LOCATION:  3731                         FACILITY:  [facility]   PHYSICIAN:  Adela Ports, MD   DATE OF BIRTH:  07-24-1928   DATE OF CONSULTATION:  05/15/2008  DATE OF DISCHARGE:                                 CONSULTATION   CHIEF COMPLAINT AND REASON FOR CONSULTATION:  Left arm and neck pain.   HISTORY OF PRESENT ILLNESS:  Ms. Klasen is a 77 year old woman with a  history of coronary artery disease status post anterior myocardial  infarction and history of CABG in 1997 with negative stress Cardiolite  in November 2006 and reportedly in 2007, but I do not have those  records.  Yesterday, on December 18, she developed constant left upper  arm and neck pain after putting up a Christmas tree.  Her history right  now is a little bit limited because of pain medication that she has  received and somnolence, but she thinks that she might have been a  little bit nauseated and short of breath yesterday evening, but she  denies vomiting, diaphoresis or chest pain.  She takes nitroglycerin,  and her pain was not relieved.  Her pain continued overnight, and she  continued to take nitroglycerin this morning.  When her discomfort did  not abate, she presented to the emergency department and received  morphine which did make her pain go away.  She does have full range of  motion of her left shoulder.   PAST MEDICAL HISTORY:  1. History of coronary artery disease status post anterior MI with      history of CABG in 1997.  Cardiac catheterization in January of      2006 demonstrated 3 patent vein grafts to the first diagonal branch      and 2 marginal branches.  Her LIMA was atretic, and there was      retrograde LAD filling via a first diagonal vein graft.  She did      have a hospitalization in July of 2007 for chest pain and was ruled      out for myocardial infarction.  She  had negative Cardiolite exams      in November 2006 and possibly in 2007 following her rule out for      myocardial infarction.  2. History of carotid endarterectomy on the right.  3. History of partial colectomy.  4. Hypertension.  5. Dyslipidemia.  6. Left bundle branch block.  7. Status post appendectomy.  8. Diverticulosis.  9. Endometriosis.  10.Mild sleep apnea.  11.Status post kyphoplasty.  12.Osteopenia.   SOCIAL HISTORY:  She lives with her husband and is a nonsmoker and does  not drink alcohol.   FAMILY HISTORY:  Is positive for coronary disease in her parents, aunts,  uncles, and other family members.   REVIEW OF SYSTEMS:  Is limited by the patient's somnolence, but she says  that otherwise she has been feeling well and denies any specific problem  other than listed in  the HPI.   ALLERGIES:  SULFA AND COMPAZINE.   MEDICATIONS:  The patient is currently unsure of what medications she  takes but does take aspirin, Toprol, Crestor, and p.r.n. nitroglycerin  as well as fish oil.   PHYSICAL EXAMINATION:  Temperature 96.8, pulse 77, respiratory rate 20,  blood pressure 116/60, oxygen saturation 94% on room air.  In general, she is a somnolent woman but oriented x3 when she is awake  in no acute distress.  HEENT:  Extraocular muscles intact.  Mucous membranes moist.  NECK:  Supple without lymphadenopathy or thyromegaly.  Carotids are 2+  bilaterally without bruits.  JVD is not elevated.  CARDIOVASCULAR:  Regular rate and rhythm.  Normal S1-S2.  No murmurs,  rubs, or gallops appreciated.  LUNGS:  Clear to auscultation bilaterally.  ABDOMEN:  Soft, nontender, nondistended with positive bowel sounds and  no rebound or guarding.  EXTREMITIES:  No clubbing, cyanosis or edema.  MUSCULOSKELETAL:  There is no joint deformity or effusion.  She has full  range of motion in her left shoulder, but she does have left  periscapular tenderness that is quite pronounced.  There is no  anterior  chest tenderness or neck tenderness.  NEUROLOGIC EXAM:  Strength, sensation, and function are normal in all  extremities.   RADIOLOGY:  Chest x-ray shows no acute process.  She does have evidence  of prior CABG and kyphoplasty and mild COPD.   EKG shows sinus rhythm at a rate of 64 with a left bundle branch block  and Q-waves in V1-3.   LABORATORY DATA:  Notable for creatinine of 0.8.  BNP of 383.  D-dimer  0.31.  Cardiac enzymes were checked 4 times within a 4-hour period and  were negative x4.   ASSESSMENT/PLAN:  Ms. Rynders is a 77 year old woman with a history of  myocardial infarction, coronary artery bypass grafting, and cardiac risk  factors.  She presents with atypical left shoulder and neck pain after  trimming a Christmas tree with periscapular tenderness that was relieved  by morphine but not by nitroglycerin.  Cardiac enzymes were negative x4.  I agree with the admitting team that this is likely musculoskeletal.  Would recommend continuing aspirin, statin, beta blocker and fish oil  for a chronic CAD.  Continue to monitor while evaluation for  musculoskeletal issues is undertaken.  If she remains stable, consider  stress test as an outpatient for further evaluation if she has  persistent pain or concern remains for ischemia.      Adela Ports, MD  Electronically Signed     DWM/MEDQ  D:  05/15/2008  T:  05/15/2008  Job:  [id number]

## 2010-10-11 ENCOUNTER — Other Ambulatory Visit: Payer: Self-pay | Admitting: Gastroenterology

## 2010-12-20 ENCOUNTER — Other Ambulatory Visit: Payer: Self-pay | Admitting: Family Medicine

## 2010-12-20 DIAGNOSIS — R220 Localized swelling, mass and lump, head: Secondary | ICD-10-CM

## 2010-12-20 DIAGNOSIS — R221 Localized swelling, mass and lump, neck: Secondary | ICD-10-CM

## 2010-12-22 ENCOUNTER — Ambulatory Visit
Admission: RE | Admit: 2010-12-22 | Discharge: 2010-12-22 | Disposition: A | Discharge status: Home / Self Care | Payer: Medicare Other | Source: Ambulatory Visit | Attending: Family Medicine | Admitting: Family Medicine

## 2010-12-22 DIAGNOSIS — R221 Localized swelling, mass and lump, neck: Secondary | ICD-10-CM

## 2010-12-22 DIAGNOSIS — R220 Localized swelling, mass and lump, head: Secondary | ICD-10-CM

## 2010-12-22 MED ORDER — IOHEXOL 300 MG/ML  SOLN
75.0000 mL | Freq: Once | INTRAMUSCULAR | Status: AC | PRN
  Administered 2010-12-22: 75 mL via INTRAVENOUS

## 2010-12-31 ENCOUNTER — Emergency Department (HOSPITAL_COMMUNITY): Payer: Medicare Other

## 2010-12-31 ENCOUNTER — Emergency Department (HOSPITAL_COMMUNITY)
Admission: EM | Admit: 2010-12-31 | Discharge: 2010-12-31 | Disposition: A | Discharge status: Home / Self Care | Payer: Medicare Other | Attending: Emergency Medicine | Admitting: Emergency Medicine

## 2010-12-31 ENCOUNTER — Telehealth: Payer: Self-pay | Admitting: Nurse Practitioner

## 2010-12-31 DIAGNOSIS — R209 Unspecified disturbances of skin sensation: Secondary | ICD-10-CM | POA: Insufficient documentation

## 2010-12-31 DIAGNOSIS — I2510 Atherosclerotic heart disease of native coronary artery without angina pectoris: Secondary | ICD-10-CM | POA: Insufficient documentation

## 2010-12-31 DIAGNOSIS — E789 Disorder of lipoprotein metabolism, unspecified: Secondary | ICD-10-CM | POA: Insufficient documentation

## 2010-12-31 DIAGNOSIS — I252 Old myocardial infarction: Secondary | ICD-10-CM | POA: Insufficient documentation

## 2010-12-31 DIAGNOSIS — W010XXA Fall on same level from slipping, tripping and stumbling without subsequent striking against object, initial encounter: Secondary | ICD-10-CM | POA: Insufficient documentation

## 2010-12-31 DIAGNOSIS — Y92009 Unspecified place in unspecified non-institutional (private) residence as the place of occurrence of the external cause: Secondary | ICD-10-CM | POA: Insufficient documentation

## 2010-12-31 DIAGNOSIS — Z7982 Long term (current) use of aspirin: Secondary | ICD-10-CM | POA: Insufficient documentation

## 2010-12-31 DIAGNOSIS — Z79899 Other long term (current) drug therapy: Secondary | ICD-10-CM | POA: Insufficient documentation

## 2010-12-31 DIAGNOSIS — Z8673 Personal history of transient ischemic attack (TIA), and cerebral infarction without residual deficits: Secondary | ICD-10-CM | POA: Insufficient documentation

## 2010-12-31 DIAGNOSIS — K219 Gastro-esophageal reflux disease without esophagitis: Secondary | ICD-10-CM | POA: Insufficient documentation

## 2010-12-31 DIAGNOSIS — M25519 Pain in unspecified shoulder: Secondary | ICD-10-CM | POA: Insufficient documentation

## 2010-12-31 DIAGNOSIS — M25529 Pain in unspecified elbow: Secondary | ICD-10-CM | POA: Insufficient documentation

## 2010-12-31 DIAGNOSIS — Z951 Presence of aortocoronary bypass graft: Secondary | ICD-10-CM | POA: Insufficient documentation

## 2010-12-31 DIAGNOSIS — S52043A Displaced fracture of coronoid process of unspecified ulna, initial encounter for closed fracture: Secondary | ICD-10-CM | POA: Insufficient documentation

## 2010-12-31 DIAGNOSIS — IMO0002 Reserved for concepts with insufficient information to code with codable children: Secondary | ICD-10-CM | POA: Insufficient documentation

## 2010-12-31 NOTE — Telephone Encounter (Signed)
Pt called this afternoon stating that she has been having left forearm and hand numbness all morning.  No loss of strength.  This is not similar to prior angina.  She does have a h/o both cad and cva.  She is going to come to ER for evaluation.

## 2011-02-05 LAB — HM MAMMOGRAPHY: HM Mammogram: NORMAL

## 2011-02-14 LAB — PREPARE PLATELET PHERESIS

## 2011-03-02 LAB — BASIC METABOLIC PANEL
BUN: 15 mg/dL (ref 6–23)
CO2: 27 mEq/L (ref 19–32)
Calcium: 8.8 mg/dL (ref 8.4–10.5)
Chloride: 103 mEq/L (ref 96–112)
Creatinine, Ser: 1.06 mg/dL (ref 0.4–1.2)
GFR calc Af Amer: >60 mL/min (ref >60)
GFR calc non Af Amer: 50 mL/min — ABNORMAL LOW (ref >60)
Glucose, Bld: 120 mg/dL — ABNORMAL HIGH (ref 70–99)
Potassium: 4.3 mEq/L (ref 3.5–5.1)
Sodium: 138 mEq/L (ref 135–145)

## 2011-03-02 LAB — COMPREHENSIVE METABOLIC PANEL
ALT: 20 U/L (ref 0–35)
AST: 24 U/L (ref 0–37)
Albumin: 4 g/dL (ref 3.5–5.2)
Alkaline Phosphatase: 59 U/L (ref 39–117)
BUN: 13 mg/dL (ref 6–23)
CO2: 28 mEq/L (ref 19–32)
Calcium: 9.3 mg/dL (ref 8.4–10.5)
Chloride: 101 mEq/L (ref 96–112)
Creatinine, Ser: 0.79 mg/dL (ref 0.4–1.2)
GFR calc Af Amer: >60 mL/min (ref >60)
GFR calc non Af Amer: >60 mL/min (ref >60)
Glucose, Bld: 102 mg/dL — ABNORMAL HIGH (ref 70–99)
Potassium: 4.1 mEq/L (ref 3.5–5.1)
Sodium: 136 mEq/L (ref 135–145)
Total Bilirubin: 0.8 mg/dL (ref 0.3–1.2)
Total Protein: 6.8 g/dL (ref 6.0–8.3)

## 2011-03-02 LAB — LIPID PANEL
Cholesterol: 130 mg/dL (ref 0–200)
HDL: 38 mg/dL — ABNORMAL LOW (ref >39)
LDL Cholesterol: 74 mg/dL (ref 0–99)
Total CHOL/HDL Ratio: 3.4 RATIO
Triglycerides: 88 mg/dL (ref <150)
VLDL: 18 mg/dL (ref 0–40)

## 2011-03-02 LAB — B-NATRIURETIC PEPTIDE (CONVERTED LAB)
Pro B Natriuretic peptide (BNP): 363 pg/mL — ABNORMAL HIGH (ref 0.0–100.0)
Pro B Natriuretic peptide (BNP): 383 pg/mL — ABNORMAL HIGH (ref 0.0–100.0)
Pro B Natriuretic peptide (BNP): 464 pg/mL — ABNORMAL HIGH (ref 0.0–100.0)

## 2011-03-02 LAB — POCT CARDIAC MARKERS
CKMB, poc: 1 ng/mL (ref 1.0–8.0)
CKMB, poc: 1.3 ng/mL (ref 1.0–8.0)
CKMB, poc: <1 ng/mL — ABNORMAL LOW (ref 1.0–8.0)
Myoglobin, poc: 62.2 ng/mL (ref 12–200)
Myoglobin, poc: 68.3 ng/mL (ref 12–200)
Myoglobin, poc: 82.2 ng/mL (ref 12–200)
Troponin i, poc: <0.05 ng/mL (ref 0.00–0.09)
Troponin i, poc: <0.05 ng/mL (ref 0.00–0.09)
Troponin i, poc: <0.05 ng/mL (ref 0.00–0.09)

## 2011-03-02 LAB — CBC
HCT: 39.3 % (ref 36.0–46.0)
Hemoglobin: 13.1 g/dL (ref 12.0–15.0)
MCHC: 33.5 g/dL (ref 30.0–36.0)
MCV: 92.5 fL (ref 78.0–100.0)
Platelets: 238 10*3/uL (ref 150–400)
RBC: 4.24 MIL/uL (ref 3.87–5.11)
RDW: 13.7 % (ref 11.5–15.5)
WBC: 7.3 10*3/uL (ref 4.0–10.5)

## 2011-03-02 LAB — DIFFERENTIAL
Basophils Absolute: 0 10*3/uL (ref 0.0–0.1)
Basophils Relative: 1 % (ref 0–1)
Eosinophils Absolute: 0.1 10*3/uL (ref 0.0–0.7)
Eosinophils Relative: 1 % (ref 0–5)
Lymphocytes Relative: 41 % (ref 12–46)
Lymphs Abs: 3 10*3/uL (ref 0.7–4.0)
Monocytes Absolute: 0.7 10*3/uL (ref 0.1–1.0)
Monocytes Relative: 9 % (ref 3–12)
Neutro Abs: 3.5 10*3/uL (ref 1.7–7.7)
Neutrophils Relative %: 48 % (ref 43–77)

## 2011-03-02 LAB — TROPONIN I
Troponin I: 0.01 ng/mL (ref 0.00–0.06)
Troponin I: 0.01 ng/mL (ref 0.00–0.06)
Troponin I: 0.01 ng/mL (ref 0.00–0.06)

## 2011-03-02 LAB — CK TOTAL AND CKMB (NOT AT ARMC)
CK, MB: 1.4 ng/mL (ref 0.3–4.0)
CK, MB: 1.4 ng/mL (ref 0.3–4.0)
CK, MB: 2.1 ng/mL (ref 0.3–4.0)
Relative Index: INVALID (ref 0.0–2.5)
Relative Index: INVALID (ref 0.0–2.5)
Relative Index: INVALID (ref 0.0–2.5)
Total CK: 49 U/L (ref 7–177)
Total CK: 51 U/L (ref 7–177)
Total CK: 77 U/L (ref 7–177)

## 2011-03-02 LAB — D-DIMER, QUANTITATIVE (NOT AT ARMC): D-Dimer, Quant: 0.31 ug/mL-FEU (ref 0.00–0.48)

## 2011-03-02 LAB — TSH: TSH: 1.562 u[IU]/mL (ref 0.350–4.500)

## 2011-03-29 ENCOUNTER — Ambulatory Visit (INDEPENDENT_AMBULATORY_CARE_PROVIDER_SITE_OTHER): Payer: Medicare Other

## 2011-03-29 ENCOUNTER — Telehealth: Payer: Self-pay | Admitting: *Deleted

## 2011-03-29 DIAGNOSIS — R079 Chest pain, unspecified: Secondary | ICD-10-CM

## 2011-03-29 MED ORDER — NITROGLYCERIN 0.4 MG SL SUBL: 0.4000 mg | SUBLINGUAL_TABLET | SUBLINGUAL | Status: DC | PRN

## 2011-03-29 NOTE — Telephone Encounter (Signed)
PT WALKED INTO OFFICE WITH C/O CHEST PAIN FOR APPROX 30 -35 MIN B/P 138/70 HR 80 EKG SHOW LBBB PER DR TAYLOR PT PAIN FREE AT THIS TIME DISCUSSED WITH DR Ladona Ridgel HAVE F/U WITH DR Elease Hashimoto IN NEXT WEEK OR SO  ALSO INSTRUCTED ON NTG USAGE IF DEVELOPS PAIN AGAIN   IF AFTER 3 NTG THEN NEEDS TO PROCEED TO ER FOR EVAL AND TX.PT VERBALIZED  UNDERSTANDING./cy

## 2011-03-30 NOTE — Telephone Encounter (Signed)
noted 

## 2011-04-02 ENCOUNTER — Encounter: Payer: Self-pay | Admitting: Nurse Practitioner

## 2011-04-02 ENCOUNTER — Ambulatory Visit (INDEPENDENT_AMBULATORY_CARE_PROVIDER_SITE_OTHER): Payer: Medicare Other | Admitting: Nurse Practitioner

## 2011-04-02 DIAGNOSIS — I10 Essential (primary) hypertension: Secondary | ICD-10-CM

## 2011-04-02 DIAGNOSIS — K219 Gastro-esophageal reflux disease without esophagitis: Secondary | ICD-10-CM

## 2011-04-02 DIAGNOSIS — R079 Chest pain, unspecified: Secondary | ICD-10-CM

## 2011-04-02 DIAGNOSIS — I2510 Atherosclerotic heart disease of native coronary artery without angina pectoris: Secondary | ICD-10-CM

## 2011-04-02 LAB — TROPONIN I: Troponin I: <0.3 ng/mL (ref <0.30)

## 2011-04-02 LAB — CK TOTAL AND CKMB (NOT AT ARMC)
CK, MB: 2.3 ng/mL (ref 0.3–4.0)
Total CK: 50 U/L (ref 7–177)

## 2011-04-02 NOTE — Assessment & Plan Note (Signed)
Blood pressure is ok. No change in her medicines today.

## 2011-04-02 NOTE — Patient Instructions (Signed)
We are going to get a stress test to evaluate your chest pain  We are going to check some heart enzymes   You will see Dr. Elease Hashimoto back in about 1 week to discuss.  Use your NTG under your tongue for recurrent chest pain. May take one tablet every 5 minutes. If you are still having discomfort after 3 tablets in 15 minutes, call 911.  Call for any problems.

## 2011-04-02 NOTE — Assessment & Plan Note (Signed)
She has not had recurrence. She has an extensive history of CAD. I have discussed her case with Dr. Elease Hashimoto. Will check cardiac enzymes. Will arrange for myoview later this week. She will see Dr. Elease Hashimoto to discuss. She does have NTG on hand. Patient is agreeable to this plan and will call if any problems develop in the interim.

## 2011-04-02 NOTE — Progress Notes (Signed)
[name] Date of Birth: 01/22/1929 Medical Record #[id number]  History of Present Illness: [name] is seen today for a work in visit. She is seen for Dr. Elease Hashimoto. She has known CAD with prior CABG and redo CABG in 2010. Her last cath was 2 months after her last surgery. Her grafts patent. She comes in today after having a spell of chest pain last Thursday. She had been to the nursing home to visit someone. When she got back to the car, she felt like a "balloon was going to explode" in her chest. She was sweaty. Lasted for about 30 minutes. She got frightened. She came to the office to be seen. Apparently had an EKG and was told to make an appointment.   She has had no further episodes of discomfort. She did not try NTG and has had it refilled this past weekend. She does not really exercise. She had inadvertently been taking both Nexium and Dexilant. She is not sure why. She says this felt like her prior chest pain syndrome.   Current Outpatient Prescriptions on File Prior to Visit  Medication Sig Dispense Refill  . aspirin 325 MG tablet Take 325 mg by mouth daily.        . Cholecalciferol (VITAMIN D PO) Take by mouth daily.        . isosorbide mononitrate (IMDUR) 60 MG 24 hr tablet Take 60 mg by mouth daily.        . metoprolol (TOPROL XL) 50 MG 24 hr tablet Take 1 tablet (50 mg total) by mouth 2 (two) times daily at 10 AM and 5 PM.  180 tablet  3  . NEXIUM 40 MG capsule TAKE 1 CAPSULE EVERY DAY  30 capsule  8  . nitroGLYCERIN (NITROSTAT) 0.4 MG SL tablet Place 1 tablet (0.4 mg total) under the tongue every 5 (five) minutes as needed.  90 tablet  1  . rosuvastatin (CRESTOR) 20 MG tablet Take 1 tablet (20 mg total) by mouth at bedtime.  90 tablet  3    Allergies  Allergen Reactions  . Niacin   . Prochlorperazine Edisylate   . Sulfonamide Derivatives     Past Medical History  Diagnosis Date  . CAD (coronary artery disease)     s/p CABG x 5 in 1997 & CABG x 3 in 2010; Last cath in  April of 2010 showed grafts to be patent. EF is 40 to 45%  . CHF (congestive heart failure)     EF is 40 to 45%  . Hyperlipemia   . Back pain   . Pleural effusion     post surgical  . PVD (peripheral vascular disease)     S/P right CEA  . GERD (gastroesophageal reflux disease)   . History of cerebrovascular disease   . Asthma   . Diverticulosis   . Depression     following bypass surgery    Past Surgical History  Procedure Date  . Coronary artery bypass graft 6213,0865    Original surgery in 1997 x 5; Redo CABG x 3 in 2010 including free RIMA to distal LAD, SVG to OM and SVG to left posterolateral  . Cardiac catheterization April 2010    Grafts were patent. EF is 40 to 45%  . Partial colectomy   . Knee surgery     left  . Carotid endarterectomy     right    History  Smoking status  . Never Smoker   Smokeless tobacco  .  Not on file    History  Alcohol Use No    Family History  Problem Relation Age of Onset  . Heart attack Mother   . Heart attack Father     Review of Systems: The review of systems is positive for recent episode of chest pain. No recurrence. She did have a fall this past Friday night but that was after the episode of chest pain. Not dizzy or lightheaded. Does have some swelling but usually goes down overnight.  All other systems were reviewed and are negative.  Physical Exam: BP 118/72  Pulse 91  Ht 5' 5.5" (1.664 m)  Wt 155 lb 1.9 oz (70.362 kg)  BMI 25.42 kg/m2 Patient is very pleasant and in no acute distress. Skin is warm and dry. Color is normal.  HEENT is unremarkable. Normocephalic/atraumatic. PERRL. Sclera are nonicteric. Neck is supple. No masses. No JVD. Lungs are clear. Cardiac exam shows a regular rate and rhythm. Abdomen is soft. Extremities are without edema. Gait and ROM are intact. No gross neurologic deficits noted.  LABORATORY DATA: EKG shows sinus rhythm, septal Q's and ST changes in V5 and V6. I do not have an old tracing to  compare.   Assessment / Plan:

## 2011-04-02 NOTE — Assessment & Plan Note (Signed)
She was previously taking both Nexium and Dexilant last week when she had this spell of chest pain. I do not think this was GERD. She is now just on the Nexium.

## 2011-04-05 ENCOUNTER — Ambulatory Visit (HOSPITAL_COMMUNITY): Payer: Medicare Other | Attending: Internal Medicine | Admitting: Radiology

## 2011-04-05 DIAGNOSIS — R0789 Other chest pain: Secondary | ICD-10-CM | POA: Insufficient documentation

## 2011-04-05 DIAGNOSIS — I10 Essential (primary) hypertension: Secondary | ICD-10-CM | POA: Insufficient documentation

## 2011-04-05 DIAGNOSIS — Z951 Presence of aortocoronary bypass graft: Secondary | ICD-10-CM | POA: Insufficient documentation

## 2011-04-05 DIAGNOSIS — R0609 Other forms of dyspnea: Secondary | ICD-10-CM | POA: Insufficient documentation

## 2011-04-05 DIAGNOSIS — R5381 Other malaise: Secondary | ICD-10-CM | POA: Insufficient documentation

## 2011-04-05 DIAGNOSIS — R0989 Other specified symptoms and signs involving the circulatory and respiratory systems: Secondary | ICD-10-CM | POA: Insufficient documentation

## 2011-04-05 DIAGNOSIS — Z8249 Family history of ischemic heart disease and other diseases of the circulatory system: Secondary | ICD-10-CM | POA: Insufficient documentation

## 2011-04-05 DIAGNOSIS — R5383 Other fatigue: Secondary | ICD-10-CM | POA: Insufficient documentation

## 2011-04-05 DIAGNOSIS — I739 Peripheral vascular disease, unspecified: Secondary | ICD-10-CM | POA: Insufficient documentation

## 2011-04-05 DIAGNOSIS — R0602 Shortness of breath: Secondary | ICD-10-CM

## 2011-04-05 DIAGNOSIS — R61 Generalized hyperhidrosis: Secondary | ICD-10-CM | POA: Insufficient documentation

## 2011-04-05 DIAGNOSIS — I25810 Atherosclerosis of coronary artery bypass graft(s) without angina pectoris: Secondary | ICD-10-CM

## 2011-04-05 DIAGNOSIS — Z8679 Personal history of other diseases of the circulatory system: Secondary | ICD-10-CM | POA: Insufficient documentation

## 2011-04-05 DIAGNOSIS — E785 Hyperlipidemia, unspecified: Secondary | ICD-10-CM | POA: Insufficient documentation

## 2011-04-05 MED ORDER — ADENOSINE (DIAGNOSTIC) 3 MG/ML IV SOLN: 0.8400 mg/kg | Freq: Once | INTRAVENOUS | Status: DC

## 2011-04-05 MED ORDER — TECHNETIUM TC 99M TETROFOSMIN IV KIT
11.0000 | PACK | Freq: Once | INTRAVENOUS | Status: AC | PRN
  Administered 2011-04-05: 11 via INTRAVENOUS

## 2011-04-05 MED ORDER — ADENOSINE (DIAGNOSTIC) 3 MG/ML IV SOLN
0.5600 mg/kg | Freq: Once | INTRAVENOUS | Status: AC
  Administered 2011-04-05: 39 mg via INTRAVENOUS

## 2011-04-05 MED ORDER — TECHNETIUM TC 99M TETROFOSMIN IV KIT
33.0000 | PACK | Freq: Once | INTRAVENOUS | Status: AC | PRN
  Administered 2011-04-05: 33 via INTRAVENOUS

## 2011-04-05 NOTE — Progress Notes (Signed)
[facility] SITE 3 NUCLEAR MED [address] [place] [address] [phone]  Cardiology Nuclear Med Study  [name] is a 77 y.o. female [id number] 02-Feb-1929   Nuclear Med Background Indication for Stress Test:  Evaluation for Ischemia and Graft Patency History:  '97 MI>CABG; 1/10 ZDG:[id number] CABG; 4/10 Cath:Patent Grafts, EF=40-45%  Cardiac Risk Factors: CVA, Family History - CAD, Hypertension, Lipids and PVD  Symptoms:  Chest Pressure.  (last episode of chest discomfort was last week), Diaphoresis, DOE and Fatigue   Nuclear Pre-Procedure Caffeine/Decaff Intake:  None NPO After: 7:30pm   Lungs:  Clear.  O2 SAT 96% on RA. IV 0.9% NS with Angio Cath:  20g  IV Site: R Antecubital x 1, tolerated well IV Started by:  Irean Hong, RN  Chest Size (in):  36 Cup Size: B  Height: 5\' 5"  (1.651 m)  Weight:  153 lb (69.4 kg)  BMI:  Body mass index is 25.46 kg/(m^2). Tech Comments:  Metoprolol held this am    Nuclear Med Study 1 or 2 day study: 1 day  Stress Test Type:  Adenosine  Reading MD: Dietrich Pates, MD  Order Authorizing Provider:  Kristeen Miss, MD, Norma Fredrickson, NP  Resting Radionuclide: Technetium 78m Tetrofosmin  Resting Radionuclide Dose: 11.0 mCi   Stress Radionuclide:  Technetium 80m Tetrofosmin  Stress Radionuclide Dose: 33.0 mCi           Stress Protocol Rest HR: 78 Stress HR: 103  Rest BP: 130/70 Stress BP: 128/58  Exercise Time (min): n/a METS: n/a   Predicted Max HR: 140 bpm % Max HR: 73.57 bpm Rate Pressure Product: 63875   Dose of Adenosine (mg):  38.9 Dose of Lexiscan: n/a mg  Dose of Atropine (mg): n/a Dose of Dobutamine: n/a mcg/kg/min (at max HR)  Stress Test Technologist: Smiley Houseman, CMA-N  Nuclear Technologist:  Domenic Polite, CNMT     Rest Procedure:  Myocardial perfusion imaging was performed at rest 45 minutes following the intravenous administration of Technetium 29m Tetrofosmin.  Rest ECG: ILBBB with  occasional PVC's.  Stress Procedure:  The patient received IV adenosine at 140 mcg/kg/min for 4 minutes.  There were no significant changes with infusion.  Technetium 68m Tetrofosmin was injected at the 2 minute mark and quantitative spect images were obtained after a 45 minute delay.  Stress ECG: No significant change from baseline ECG  QPS Raw Data Images:  Rest images were motion corrected Stress Images:  Normal perfusion with mild apical thinning. Rest Images:  Comparison with the stress images reveals no significant change. Subtraction (SDS):  No evidence of ischemia. Transient Ischemic Dilatation (Normal <1.22):  1.08 Lung/Heart Ratio (Normal <0.45):  0.28  Quantitative Gated Spect Images QGS EDV:  62 ml QGS ESV:  15 ml QGS cine images:  NL LV Function; NL Wall Motion QGS EF: 76%  Impression Exercise Capacity:  Adenosine study with no exercise. BP Response:  Normal blood pressure response. Clinical Symptoms:  Mild chest pain/dyspnea. ECG Impression:  No significant ST segment change suggestive of ischemia. Comparison with Prior Nuclear Study: no evidence of MI  Overall Impression:  Normal stress nuclear study.  LVEF 76%. Dietrich Pates

## 2011-04-05 NOTE — Progress Notes (Signed)
Addended by: Erin Hearing on: 04/05/2011 02:59 PM   Modules accepted: Orders

## 2011-04-06 NOTE — Telephone Encounter (Signed)
Addended by: Burnett Kanaris A on: 04/06/2011 09:43 AM   Modules accepted: Orders

## 2011-04-13 ENCOUNTER — Ambulatory Visit: Payer: Medicare Other | Admitting: Cardiovascular Disease

## 2011-04-16 NOTE — Progress Notes (Signed)
SEE PHONE NOTE FROM 03-29-11./CY

## 2011-08-13 ENCOUNTER — Other Ambulatory Visit: Payer: Self-pay | Admitting: Gastroenterology

## 2011-08-14 ENCOUNTER — Other Ambulatory Visit: Payer: Self-pay | Admitting: Gastroenterology

## 2011-08-15 ENCOUNTER — Encounter: Payer: Self-pay | Admitting: *Deleted

## 2011-08-15 ENCOUNTER — Other Ambulatory Visit: Payer: Self-pay | Admitting: Gastroenterology

## 2011-08-16 ENCOUNTER — Ambulatory Visit (INDEPENDENT_AMBULATORY_CARE_PROVIDER_SITE_OTHER): Payer: Medicare Other | Admitting: Gastroenterology

## 2011-08-16 ENCOUNTER — Encounter: Payer: Self-pay | Admitting: Gastroenterology

## 2011-08-16 ENCOUNTER — Other Ambulatory Visit (INDEPENDENT_AMBULATORY_CARE_PROVIDER_SITE_OTHER): Payer: Medicare Other

## 2011-08-16 VITALS — BP 118/60 | HR 68 | Ht 65.0 in | Wt 156.1 lb

## 2011-08-16 DIAGNOSIS — K219 Gastro-esophageal reflux disease without esophagitis: Secondary | ICD-10-CM | POA: Insufficient documentation

## 2011-08-16 DIAGNOSIS — R6889 Other general symptoms and signs: Secondary | ICD-10-CM

## 2011-08-16 DIAGNOSIS — I501 Left ventricular failure: Secondary | ICD-10-CM

## 2011-08-16 LAB — CBC WITH DIFFERENTIAL/PLATELET
Basophils Absolute: 0 10*3/uL (ref 0.0–0.1)
Basophils Relative: 0.5 % (ref 0.0–3.0)
Eosinophils Absolute: 0.1 10*3/uL (ref 0.0–0.7)
Eosinophils Relative: 1 % (ref 0.0–5.0)
HCT: 41.7 % (ref 36.0–46.0)
Hemoglobin: 14 g/dL (ref 12.0–15.0)
Lymphocytes Relative: 37.5 % (ref 12.0–46.0)
Lymphs Abs: 2.8 10*3/uL (ref 0.7–4.0)
MCHC: 33.5 g/dL (ref 30.0–36.0)
MCV: 93.5 fl (ref 78.0–100.0)
Monocytes Absolute: 0.6 10*3/uL (ref 0.1–1.0)
Monocytes Relative: 8.5 % (ref 3.0–12.0)
Neutro Abs: 3.9 10*3/uL (ref 1.4–7.7)
Neutrophils Relative %: 52.5 % (ref 43.0–77.0)
Platelets: 262 10*3/uL (ref 150.0–400.0)
RBC: 4.46 Mil/uL (ref 3.87–5.11)
RDW: 13.2 % (ref 11.5–14.6)
WBC: 7.5 10*3/uL (ref 4.5–10.5)

## 2011-08-16 LAB — COMPREHENSIVE METABOLIC PANEL
ALT: 16 U/L (ref 0–35)
AST: 22 U/L (ref 0–37)
Albumin: 4 g/dL (ref 3.5–5.2)
Alkaline Phosphatase: 55 U/L (ref 39–117)
BUN: 14 mg/dL (ref 6–23)
CO2: 27 mEq/L (ref 19–32)
Calcium: 9.3 mg/dL (ref 8.4–10.5)
Chloride: 101 mEq/L (ref 96–112)
Creatinine, Ser: 0.8 mg/dL (ref 0.4–1.2)
GFR: 69.24 mL/min (ref >60.00)
Glucose, Bld: 87 mg/dL (ref 70–99)
Potassium: 4.2 mEq/L (ref 3.5–5.1)
Sodium: 137 mEq/L (ref 135–145)
Total Bilirubin: 0.6 mg/dL (ref 0.3–1.2)
Total Protein: 7.2 g/dL (ref 6.0–8.3)

## 2011-08-16 LAB — TSH: TSH: 1.39 u[IU]/mL (ref 0.35–5.50)

## 2011-08-16 LAB — VITAMIN B12: Vitamin B-12: 305 pg/mL (ref 211–911)

## 2011-08-16 LAB — FERRITIN: Ferritin: 71.6 ng/mL (ref 10.0–291.0)

## 2011-08-16 LAB — IBC PANEL
Iron: 93 ug/dL (ref 42–145)
Saturation Ratios: 24.4 % (ref 20.0–50.0)
Transferrin: 272.7 mg/dL (ref 212.0–360.0)

## 2011-08-16 LAB — FOLATE: Folate: 24.2 ng/mL (ref >5.9)

## 2011-08-16 MED ORDER — ESOMEPRAZOLE MAGNESIUM 40 MG PO CPDR: 40.0000 mg | DELAYED_RELEASE_CAPSULE | Freq: Every day | ORAL | Status: DC

## 2011-08-16 NOTE — Progress Notes (Signed)
History of Present Illness:  This is a very pleasant 78 year old Caucasian female who has chronic acid reflux managed by Nexium 40 mg a day. She is up-to-date on her endoscopy and colonoscopy exams. Previous manometry and 24-hour pH probe is both unremarkable except for a borderline lower esophageal sphincter pressure. There was no evidence of achalasia. She has multiple cardiac issues followed closely by cardiology. The patient underwent coronary artery bypass grafting 2 years ago no complications. Currently she is on Nexium 40 mg a day and denies acid reflux symptoms, dysphagia, hepatobiliary or lower gastrointestinal symptoms. Minor complaint today is nonspecific fatigue.  I have reviewed this patient's present history, medical and surgical past history, allergies and medications.     ROS: The remainder of the 10 point ROS is negative     Physical Exam: Elderly patient in no acute distress. Blood pressure 118/60 and pulse 60 and regular. BMI 25.98. General well developed well nourished patient in no acute distress, appearing her stated age Eyes PERRLA, no icterus, fundoscopic exam per opthamologist Skin no lesions noted Neck supple, no adenopathy, no thyroid enlargement, no tenderness Chest clear to percussion and auscultation Heart no significant murmurs, gallops or rubs noted Abdomen no hepatosplenomegaly masses or tenderness, BS normal.  Extremities no acute joint lesions, edema, phlebitis or evidence of cellulitis. Neurologic patient oriented x 3, cranial nerves intact, no focal neurologic deficits noted. Psychological mental status normal and normal affect.  Assessment and plan: chronic GERD under good control with daily PPI therapy. I have ordered labs for review including anemia profile because of her complaints of fatigue. Do not think she needs endoscopy or colonoscopy at this time. Chronic reflux regime reviewed with patient. Encounter Diagnoses  Name Primary?  . GERD  (gastroesophageal reflux disease) Yes  . Other general symptoms    . Congestive heart failure, chronic, left-sided    . Chronic left-sided CHF (congestive heart failure)

## 2011-08-16 NOTE — Patient Instructions (Signed)
Your physician has requested that you go to the basement for the following lab work before leaving today: CMET, CBC, TSH, Anemia Panel We have sent the following medications to your pharmacy for you to pick up at your convenience: Nexium CC: Dr Lynnea Ferrier

## 2011-10-04 ENCOUNTER — Encounter: Payer: Self-pay | Admitting: Cardiovascular Disease

## 2011-10-04 ENCOUNTER — Ambulatory Visit (INDEPENDENT_AMBULATORY_CARE_PROVIDER_SITE_OTHER): Payer: Medicare Other | Admitting: Cardiovascular Disease

## 2011-10-04 VITALS — BP 130/80 | HR 69 | Ht 65.0 in | Wt 155.8 lb

## 2011-10-04 DIAGNOSIS — I2510 Atherosclerotic heart disease of native coronary artery without angina pectoris: Secondary | ICD-10-CM

## 2011-10-04 MED ORDER — ROSUVASTATIN CALCIUM 20 MG PO TABS: 20.0000 mg | ORAL_TABLET | Freq: Every day | ORAL | Status: DC

## 2011-10-04 NOTE — Progress Notes (Signed)
[name] Date of Birth  1929/01/06       [facility]    [place] [place] [address]  [address], [address] [place], [place]  [address]   [place], [place]  [address] [phone]     [phone]   Fax  [phone]    Fax [phone]  Problem List: 1. Coronary artery disease: She status post CABG in 1997 and again in 2010. Heart catheterization in April 2010 and revealed severe native coronary artery disease. The LAD is occluded after giving off 2 large septal branches. The left circumflex artery has moderate irregularities and has competitive flow from the saphenous vein graft to the acute marginal artery. The right coronary artery is occluded. The SVG to the first obtuse marginal has minor luminal irregularities. The free RIMA originates off the hood of the SVG. It is nice graft and anastomosis to the distal LAD is normal. The SVG to right coronary artery is smooth and normal. The posterior descending artery and the posterior lateral segment artery are normal. Her ejection fraction is 40-45%. 2. Congestive heart failure-EF of 40-45% 3. Hyperlipidemia 4. Peripheral vascular disease status post right carotid endarterectomy  History of Present Illness:  [name] is an 78 y.o. female with the above noted history.   She's not had any episodes of chest pain. She does note lots of fatigue and as a result she has not been exercising much on a treadmill. She gets out of breath community slight activity.  She's not had any syncope or presyncope.  Current Outpatient Prescriptions on File Prior to Visit  Medication Sig Dispense Refill  . aspirin 325 MG tablet Take 325 mg by mouth daily.        . Cholecalciferol (VITAMIN D PO) Take by mouth daily.        Marland Kitchen esomeprazole (NEXIUM) 40 MG capsule Take 1 capsule (40 mg total) by mouth daily before breakfast.  30 capsule  4  . isosorbide mononitrate (IMDUR) 60 MG 24 hr tablet Take 60 mg by mouth daily.        . nitroGLYCERIN  (NITROSTAT) 0.4 MG SL tablet Place 1 tablet (0.4 mg total) under the tongue every 5 (five) minutes as needed.  90 tablet  1  . metoprolol (TOPROL XL) 50 MG 24 hr tablet Take 1 tablet (50 mg total) by mouth 2 (two) times daily at 10 AM and 5 PM.  180 tablet  3  . rosuvastatin (CRESTOR) 20 MG tablet Take 1 tablet (20 mg total) by mouth at bedtime.  90 tablet  3    Allergies  Allergen Reactions  . Niacin   . Prochlorperazine Edisylate   . Sulfonamide Derivatives     Past Medical History  Diagnosis Date  . CAD (coronary artery disease)     s/p CABG x 5 in 1997 & CABG x 3 in 2010; Last cath in April of 2010 showed grafts to be patent. EF is 40 to 45%  . CHF (congestive heart failure)     EF is 40 to 45%  . Hyperlipemia   . Pleural effusion     post surgical  . PVD (peripheral vascular disease)     S/P right CEA  . GERD (gastroesophageal reflux disease)   . History of cerebrovascular disease   . Asthma   . Diverticulosis   . Depression     following bypass surgery  . Hypertension   . Hepatitis A   . Sleep apnea   .  Stroke 2010    Past Surgical History  Procedure Date  . Coronary artery bypass graft [id number]    Original surgery in 1997 x 5; Redo CABG x 3 in 2010 including free RIMA to distal LAD, SVG to OM and SVG to left posterolateral  . Cardiac catheterization April 2010    Grafts were patent. EF is 40 to 45%  . Partial colectomy     due to colon abcess  . Knee surgery     left  . Carotid endarterectomy     right  . Appendectomy     History  Smoking status  . Never Smoker   Smokeless tobacco  . Never Used    History  Alcohol Use No    Family History  Problem Relation Age of Onset  . Heart attack Mother   . Heart attack Father   . Diabetes Mother   . Diabetes Sister   . Diabetes Maternal Grandmother   . Diabetes Paternal Grandmother   . Colon cancer Neg Hx   . Stroke Sister     Reviw of Systems:  Reviewed in the HPI.  All other systems are  negative.  Physical Exam: Blood pressure 152/70, pulse 69, height 5\' 5"  (1.651 m), weight 155 lb 12.8 oz (70.67 kg). General: Well developed, well nourished, in no acute distress.  Head: Normocephalic, atraumatic, sclera non-icteric, mucus membranes are moist,   Neck: Supple. Carotids are 2 + without bruits. No JVD.  She has a right carotid endarterectomy scar.  Lungs: Clear bilaterally to auscultation.  Heart: regular rate.  normal  S1 S2. No murmurs, gallops or rubs.  Abdomen: Soft, non-tender, non-distended with normal bowel sounds. No hepatomegaly. No rebound/guarding. No masses.  Msk:  Strength and tone are normal  Extremities: No clubbing or cyanosis. No edema.  Distal pedal pulses are 2+ and equal bilaterally.  Neuro: Alert and oriented X 3. Moves all extremities spontaneously.  Psych:  Responds to questions appropriately with a normal affect.  ECG:  Assessment / Plan:

## 2011-10-04 NOTE — Assessment & Plan Note (Signed)
[name] is doing well. I don't think that she's having any problems with unstable angina. I suspect that her fatigue is just do to generalized deconditioning. We have her call us if she has any problems.

## 2011-10-04 NOTE — Patient Instructions (Signed)
Your physician wants you to follow-up in: 6 MONTHS You will receive a reminder letter in the mail two months in advance. If you don't receive a letter, please call our office to schedule the follow-up appointment.  PT WILL START ACCELERATE PROGRAM

## 2011-10-26 ENCOUNTER — Ambulatory Visit: Payer: Medicare Other | Admitting: Cardiovascular Disease

## 2011-10-29 ENCOUNTER — Other Ambulatory Visit: Payer: Self-pay | Admitting: *Deleted

## 2011-10-29 DIAGNOSIS — I2510 Atherosclerotic heart disease of native coronary artery without angina pectoris: Secondary | ICD-10-CM

## 2011-10-29 MED ORDER — METOPROLOL SUCCINATE ER 50 MG PO TB24: ORAL_TABLET | ORAL | Status: DC

## 2011-11-26 ENCOUNTER — Other Ambulatory Visit: Payer: Self-pay | Admitting: *Deleted

## 2011-11-26 MED ORDER — ISOSORBIDE MONONITRATE ER 60 MG PO TB24: 60.0000 mg | ORAL_TABLET | Freq: Every day | ORAL | Status: DC

## 2011-11-26 NOTE — Telephone Encounter (Signed)
Fax Received. Refill Completed. [name] (R.M.A)   

## 2011-11-28 ENCOUNTER — Emergency Department (HOSPITAL_COMMUNITY): Payer: Medicare Other

## 2011-11-28 ENCOUNTER — Encounter (HOSPITAL_COMMUNITY): Payer: Self-pay | Admitting: Emergency Medicine

## 2011-11-28 ENCOUNTER — Telehealth: Payer: Self-pay | Admitting: Cardiovascular Disease

## 2011-11-28 ENCOUNTER — Emergency Department (HOSPITAL_COMMUNITY)
Admission: EM | Admit: 2011-11-28 | Discharge: 2011-11-28 | Disposition: A | Discharge status: Home / Self Care | Payer: Medicare Other | Attending: Emergency Medicine | Admitting: Emergency Medicine

## 2011-11-28 DIAGNOSIS — M25519 Pain in unspecified shoulder: Secondary | ICD-10-CM | POA: Insufficient documentation

## 2011-11-28 DIAGNOSIS — K219 Gastro-esophageal reflux disease without esophagitis: Secondary | ICD-10-CM | POA: Insufficient documentation

## 2011-11-28 DIAGNOSIS — R209 Unspecified disturbances of skin sensation: Secondary | ICD-10-CM | POA: Insufficient documentation

## 2011-11-28 DIAGNOSIS — Z79899 Other long term (current) drug therapy: Secondary | ICD-10-CM | POA: Insufficient documentation

## 2011-11-28 DIAGNOSIS — Z8673 Personal history of transient ischemic attack (TIA), and cerebral infarction without residual deficits: Secondary | ICD-10-CM | POA: Insufficient documentation

## 2011-11-28 DIAGNOSIS — I509 Heart failure, unspecified: Secondary | ICD-10-CM | POA: Insufficient documentation

## 2011-11-28 DIAGNOSIS — I2510 Atherosclerotic heart disease of native coronary artery without angina pectoris: Secondary | ICD-10-CM | POA: Insufficient documentation

## 2011-11-28 DIAGNOSIS — Z9089 Acquired absence of other organs: Secondary | ICD-10-CM | POA: Insufficient documentation

## 2011-11-28 DIAGNOSIS — M5412 Radiculopathy, cervical region: Secondary | ICD-10-CM | POA: Insufficient documentation

## 2011-11-28 DIAGNOSIS — G4730 Sleep apnea, unspecified: Secondary | ICD-10-CM | POA: Insufficient documentation

## 2011-11-28 DIAGNOSIS — Z951 Presence of aortocoronary bypass graft: Secondary | ICD-10-CM | POA: Insufficient documentation

## 2011-11-28 DIAGNOSIS — I10 Essential (primary) hypertension: Secondary | ICD-10-CM | POA: Insufficient documentation

## 2011-11-28 DIAGNOSIS — Z7982 Long term (current) use of aspirin: Secondary | ICD-10-CM | POA: Insufficient documentation

## 2011-11-28 LAB — BASIC METABOLIC PANEL
BUN: 12 mg/dL (ref 6–23)
CO2: 27 mEq/L (ref 19–32)
Calcium: 9.5 mg/dL (ref 8.4–10.5)
Chloride: 102 mEq/L (ref 96–112)
Creatinine, Ser: 0.79 mg/dL (ref 0.50–1.10)
GFR calc Af Amer: 89 mL/min — ABNORMAL LOW (ref >90)
GFR calc non Af Amer: 77 mL/min — ABNORMAL LOW (ref >90)
Glucose, Bld: 102 mg/dL — ABNORMAL HIGH (ref 70–99)
Potassium: 4 mEq/L (ref 3.5–5.1)
Sodium: 137 mEq/L (ref 135–145)

## 2011-11-28 LAB — CBC
HCT: 38.3 % (ref 36.0–46.0)
Hemoglobin: 13.1 g/dL (ref 12.0–15.0)
MCH: 30.7 pg (ref 26.0–34.0)
MCHC: 34.2 g/dL (ref 30.0–36.0)
MCV: 89.7 fL (ref 78.0–100.0)
Platelets: 208 10*3/uL (ref 150–400)
RBC: 4.27 MIL/uL (ref 3.87–5.11)
RDW: 12.6 % (ref 11.5–15.5)
WBC: 6.7 10*3/uL (ref 4.0–10.5)

## 2011-11-28 MED ORDER — HYDROCODONE-ACETAMINOPHEN 5-325 MG PO TABS: 1.0000 | ORAL_TABLET | Freq: Four times a day (QID) | ORAL | Status: AC | PRN

## 2011-11-28 MED ORDER — MORPHINE SULFATE 4 MG/ML IJ SOLN
4.0000 mg | INTRAMUSCULAR | Status: DC | PRN
  Administered 2011-11-28: 4 mg via INTRAVENOUS
  Filled 2011-11-28: qty 1

## 2011-11-28 MED ORDER — ONDANSETRON HCL 4 MG/2ML IJ SOLN
4.0000 mg | Freq: Once | INTRAMUSCULAR | Status: AC
  Administered 2011-11-28: 4 mg via INTRAVENOUS
  Filled 2011-11-28: qty 2

## 2011-11-28 NOTE — ED Notes (Signed)
Pt reports right arm pain that started today 10 am when her hand became numb. No arm drift noted. Pt has history of stroke 3 years ago.

## 2011-11-28 NOTE — ED Provider Notes (Signed)
History     CSN: [id number] Arrival date & time 11/28/11  1303First MD Initiated Contact with Patient 11/28/11 1400      Chief Complaint  Patient presents with  . Arm Pain    HPI Pt presents to the ED with right arm pain associated with numbness and pain in her right upper arm.  She denies headache, neck pain, chest pain or shortness of breath.  She was not doing anything specifically when she started getting the pain. The pain is in the upper right humerus.  IT does not radiate.  IT feels like a severe ache inside.  Movement seems to make it somewhat worse but when she tries to massage it , it improves.  Past Medical History  Diagnosis Date  . CAD (coronary artery disease)     s/p CABG x 5 in 1997 & CABG x 3 in 2010; Last cath in April of 2010 showed grafts to be patent. EF is 40 to 45%  . CHF (congestive heart failure)     EF is 40 to 45%  . Hyperlipemia   . Pleural effusion     post surgical  . PVD (peripheral vascular disease)     S/P right CEA  . GERD (gastroesophageal reflux disease)   . History of cerebrovascular disease   . Asthma   . Diverticulosis   . Depression     following bypass surgery  . Hypertension   . Hepatitis A   . Sleep apnea   . Stroke 2010    Past Surgical History  Procedure Date  . Coronary artery bypass graft [id number]    Original surgery in 1997 x 5; Redo CABG x 3 in 2010 including free RIMA to distal LAD, SVG to OM and SVG to left posterolateral  . Cardiac catheterization April 2010    Grafts were patent. EF is 40 to 45%  . Partial colectomy     due to colon abcess  . Knee surgery     left  . Carotid endarterectomy     right  . Appendectomy     Family History  Problem Relation Age of Onset  . Heart attack Mother   . Heart attack Father   . Diabetes Mother   . Diabetes Sister   . Diabetes Maternal Grandmother   . Diabetes Paternal Grandmother   . Colon cancer Neg Hx   . Stroke Sister     History  Substance Use Topics  .  Smoking status: Never Smoker   . Smokeless tobacco: Never Used  . Alcohol Use: No    OB History    Grav Para Term Preterm Abortions TAB SAB Ect Mult Living                  Review of Systems  Constitutional: Negative for fever.  Genitourinary: Negative for dysuria.  Neurological: Negative for tremors.  All other systems reviewed and are negative.    Allergies  Prochlorperazine edisylate; Sulfonamide derivatives; and Niacin  Home Medications   Current Outpatient Rx  Name Route Sig Dispense Refill  . ASPIRIN 325 MG PO TABS Oral Take 325 mg by mouth daily.      Marland Kitchen VITAMIN D PO Oral Take by mouth daily.      Marland Kitchen ESOMEPRAZOLE MAGNESIUM 40 MG PO CPDR Oral Take 1 capsule (40 mg total) by mouth daily before breakfast. 30 capsule 4  . ISOSORBIDE MONONITRATE ER 60 MG PO TB24 Oral Take 1 tablet (60 mg total) by mouth daily.  30 tablet 5  . METOPROLOL TARTRATE 50 MG PO TABS Oral Take 100 mg by mouth 2 (two) times daily.    Marland Kitchen NITROGLYCERIN 0.4 MG SL SUBL Sublingual Place 1 tablet (0.4 mg total) under the tongue every 5 (five) minutes as needed. 90 tablet 1  . ROSUVASTATIN CALCIUM 20 MG PO TABS Oral Take 1 tablet (20 mg total) by mouth daily. 30 tablet 6  . ROSUVASTATIN CALCIUM 20 MG PO TABS Oral Take 1 tablet (20 mg total) by mouth at bedtime. 90 tablet 3    BP 138/63  Pulse 78  Temp 98 F (36.7 C)  Resp 18  SpO2 98%  Physical Exam  Nursing note and vitals reviewed. Constitutional: She appears well-developed and well-nourished. No distress.  HENT:  Head: Normocephalic and atraumatic.  Right Ear: External ear normal.  Left Ear: External ear normal.  Eyes: Conjunctivae are normal. Right eye exhibits no discharge. Left eye exhibits no discharge. No scleral icterus.  Neck: Neck supple. No tracheal deviation present.  Cardiovascular: Normal rate, regular rhythm and intact distal pulses.   Pulmonary/Chest: Effort normal and breath sounds normal. No stridor. No respiratory distress. She  has no wheezes. She has no rales.  Abdominal: Soft. Bowel sounds are normal. She exhibits no distension. There is no tenderness. There is no rebound and no guarding.  Musculoskeletal: She exhibits no edema and no tenderness.       Nl pulses bilateral radial, pain in right upper arm with neck rotation and arm movemetn  Neurological: She is alert. She has normal strength. She is not disoriented. No cranial nerve deficit ( no gross defecits noted) or sensory deficit. She exhibits normal muscle tone. She displays no seizure activity. Coordination normal.       5/5 grip strength, 5/5 lower extrem stregnth, sensation to light touch intact,   Skin: Skin is warm and dry. No rash noted.  Psychiatric: She has a normal mood and affect.    ED Course  Procedures (including critical care time)  Rate: 68  Rhythm: normal sinus rhythm  QRS Axis: LAD  Intervals: normal  ST/T Wave abnormalities: normal  Conduction Disutrbances:none  Narrative Interpretation: incomplete LBB  Old EKG Reviewed:no acute changes  Labs Reviewed  BASIC METABOLIC PANEL - Abnormal; Notable for the following:    Glucose, Bld 102 (*)     GFR calc non Af Amer 77 (*)     GFR calc Af Amer 89 (*)     All other components within normal limits  CBC   Dg Cervical Spine Complete  11/28/2011  *RADIOLOGY REPORT*  Clinical Data: Right arm and shoulder pain.  CERVICAL SPINE - COMPLETE 4+ VIEW  Comparison: CT scan of the neck dated 12/22/2010  Findings: There is narrowing of the disc spaces at C5-6 and C6-7. No significant foraminal stenosis.  Fairly extensive calcification in the right carotid bifurcation with minimal calcification on the left.  Slight left facet arthritis at C6-7 and C7-T1.  IMPRESSION: Slight degenerative disc disease at C5-6 and C6-7. Carotid artery calcifications.  Original Report Authenticated By: Gwynn Burly, M.D.   Dg Humerus Right  11/28/2011  *RADIOLOGY REPORT*  Clinical Data: Right upper arm pain with right hand  numbness.  RIGHT HUMERUS - 2+ VIEW  Comparison: None.  Findings: No acute osseous abnormality.  Degenerative changes are seen in the right acromioclavicular joint.  IMPRESSION:  1.  No acute findings. 2.  Right acromioclavicular joint osteoarthritis.  Original Report Authenticated By: Reyes Ivan, M.D.  MDM  Pt is feeling better after pain management.  Suspect that the symptoms are related to a radiculopathy.  Doubt CVA.  Doubt cardiac etiology.  NO acute vascular compromise.  No sign of infection.  Carotid calcifications noted however pt has had surgery on that area in the past.        Celene Kras, MD 11/28/11 1547

## 2011-11-28 NOTE — ED Notes (Signed)
Returned from XR 

## 2011-11-28 NOTE — ED Notes (Signed)
Pt denies any numbness in arm at this time. Pt states she developed right upper arm pain around 10am.  Pt denies any injury.  Pt reports increased pain with movement and palpation of posterior proximal humerus.

## 2011-11-28 NOTE — ED Notes (Signed)
Denies fall or injury. Pt has not other complaint at this time.

## 2011-11-28 NOTE — Telephone Encounter (Signed)
I spoke with the pt and she complains of right arm pain and hand numbness that started about 2 hours ago. The pt does have a history of stroke. The pt denies CP.  The pt describes pain as "deep".  The pt denies any issues with movement on the right side of body, dizziness, slurred speech, and facial droop.  The pt also denies any issues with her neck related to nerve pain.  The pt did contact her PCP and they recommended that she seek evaluation at the ER.  I also made the pt aware that she should seek further evaluation at the ER and she agrees with plan.

## 2011-11-28 NOTE — Telephone Encounter (Signed)
Pt's husband calling re pain in r arm, hand was numb, only has the pain now, has been having sob ,  pls call

## 2012-01-13 ENCOUNTER — Other Ambulatory Visit: Payer: Self-pay | Admitting: Gastroenterology

## 2012-06-04 ENCOUNTER — Other Ambulatory Visit: Payer: Self-pay | Admitting: *Deleted

## 2012-06-04 MED ORDER — ISOSORBIDE MONONITRATE ER 60 MG PO TB24: 60.0000 mg | ORAL_TABLET | Freq: Every day | ORAL | Status: DC

## 2012-06-05 ENCOUNTER — Other Ambulatory Visit: Payer: Self-pay | Admitting: *Deleted

## 2012-06-05 NOTE — Telephone Encounter (Signed)
Opened in Error.

## 2012-06-09 ENCOUNTER — Encounter: Payer: Self-pay | Admitting: Cardiovascular Disease

## 2012-06-09 ENCOUNTER — Ambulatory Visit (INDEPENDENT_AMBULATORY_CARE_PROVIDER_SITE_OTHER): Payer: Medicare Other | Admitting: Cardiovascular Disease

## 2012-06-09 VITALS — BP 134/66 | HR 74 | Ht 65.0 in | Wt 150.0 lb

## 2012-06-09 DIAGNOSIS — E785 Hyperlipidemia, unspecified: Secondary | ICD-10-CM

## 2012-06-09 DIAGNOSIS — I2510 Atherosclerotic heart disease of native coronary artery without angina pectoris: Secondary | ICD-10-CM

## 2012-06-09 DIAGNOSIS — I10 Essential (primary) hypertension: Secondary | ICD-10-CM

## 2012-06-09 LAB — LIPID PANEL
Cholesterol: 186 mg/dL (ref 0–200)
HDL: 98.3 mg/dL (ref >39.00)
LDL Cholesterol: 63 mg/dL (ref 0–99)
Total CHOL/HDL Ratio: 2
Triglycerides: 123 mg/dL (ref 0.0–149.0)
VLDL: 24.6 mg/dL (ref 0.0–40.0)

## 2012-06-09 LAB — BASIC METABOLIC PANEL
BUN: 12 mg/dL (ref 6–23)
CO2: 29 mEq/L (ref 19–32)
Calcium: 9.2 mg/dL (ref 8.4–10.5)
Chloride: 103 mEq/L (ref 96–112)
Creatinine, Ser: 0.9 mg/dL (ref 0.4–1.2)
GFR: 63.81 mL/min (ref >60.00)
Glucose, Bld: 106 mg/dL — ABNORMAL HIGH (ref 70–99)
Potassium: 4 mEq/L (ref 3.5–5.1)
Sodium: 139 mEq/L (ref 135–145)

## 2012-06-09 LAB — HEPATIC FUNCTION PANEL
ALT: 15 U/L (ref 0–35)
AST: 18 U/L (ref 0–37)
Albumin: 3.5 g/dL (ref 3.5–5.2)
Alkaline Phosphatase: 57 U/L (ref 39–117)
Bilirubin, Direct: 0 mg/dL (ref 0.0–0.3)
Total Bilirubin: 0.5 mg/dL (ref 0.3–1.2)
Total Protein: 6.6 g/dL (ref 6.0–8.3)

## 2012-06-09 NOTE — Assessment & Plan Note (Signed)
[name] has done extremely well. She has had bypass grafting x2. She's not had any recent episodes of angina. She has not been exercising much because she's limited by back pain. She also does not like exercising in cold weather. She may try going to [employer] up in [place].

## 2012-06-09 NOTE — Patient Instructions (Addendum)
Your physician recommends that you return for a FASTING lipid profile: today and in 6 months   Your physician wants you to follow-up in: 6 months  You will receive a reminder letter in the mail two months in advance. If you don't receive a letter, please call our office to schedule the follow-up appointment.  Your physician recommends that you continue on your current medications as directed. Please refer to the Current Medication list given to you today.  

## 2012-06-09 NOTE — Assessment & Plan Note (Addendum)
Her blood pressure is stable.

## 2012-06-09 NOTE — Progress Notes (Signed)
[name] Date of Birth  06/14/1928       [name]    [facility] [address]  [address] [place], [place]  [address]   [place], [place]  [address] [phone]     [phone]   Fax  [phone]    Fax [phone]  Problem List: 1. Coronary artery disease: She status post CABG in 1997 and again in 2010. Heart catheterization in April 2010 and revealed severe native coronary artery disease. The LAD is occluded after giving off 2 large septal branches. The left circumflex artery has moderate irregularities and has competitive flow from the saphenous vein graft to the acute marginal artery. The right coronary artery is occluded. The SVG to the first obtuse marginal has minor luminal irregularities. The free RIMA originates off the hood of the SVG. It is nice graft and anastomosis to the distal LAD is normal. The SVG to right coronary artery is smooth and normal. The posterior descending artery and the posterior lateral segment artery are normal. Her ejection fraction is 40-45%. 2. Congestive heart failure-EF of 40-45% 3. Hyperlipidemia 4. Peripheral vascular disease status post right carotid endarterectomy  History of Present Illness:  Sahalie is an 79 y.o. female with the above noted history.   She's not had any episodes of chest pain. She does note lots of fatigue and as a result she has not been exercising much on a treadmill. She gets out of breath community slight activity.  She's not had any syncope or presyncope.  June 09, 2012 She is doing well.  She is just getting over a bad cold.  She is not exercising much this winter.  She was walking about 45 minutes a day prior to the winter months.   She has had some muscular shoulder pain but no angina.    Current Outpatient Prescriptions on File Prior to Visit  Medication Sig Dispense Refill  . aspirin 325 MG tablet Take 325 mg by mouth daily.        . Cholecalciferol (VITAMIN D PO)  Take by mouth daily.        . isosorbide mononitrate (IMDUR) 60 MG 24 hr tablet Take 1 tablet (60 mg total) by mouth daily.  30 tablet  6  . metoprolol (LOPRESSOR) 50 MG tablet Take 100 mg by mouth 2 (two) times daily.      Marland Kitchen NEXIUM 40 MG capsule TAKE ONE CAPSULE EVERY DAY BEFORE BREAKFAST  30 capsule  4  . nitroGLYCERIN (NITROSTAT) 0.4 MG SL tablet Place 1 tablet (0.4 mg total) under the tongue every 5 (five) minutes as needed.  90 tablet  1  . rosuvastatin (CRESTOR) 20 MG tablet Take 1 tablet (20 mg total) by mouth daily.  30 tablet  6    Allergies  Allergen Reactions  . Prochlorperazine Edisylate Anaphylaxis  . Sulfonamide Derivatives Nausea And Vomiting  . Niacin Rash    Past Medical History  Diagnosis Date  . CAD (coronary artery disease)     s/p CABG x 5 in 1997 & CABG x 3 in 2010; Last cath in April of 2010 showed grafts to be patent. EF is 40 to 45%  . CHF (congestive heart failure)     EF is 40 to 45%  . Hyperlipemia   . Pleural effusion     post surgical  . PVD (peripheral vascular disease)     S/P right CEA  . GERD (gastroesophageal reflux disease)   .  History of cerebrovascular disease   . Asthma   . Diverticulosis   . Depression     following bypass surgery  . Hypertension   . Hepatitis A   . Sleep apnea   . Stroke 2010    Past Surgical History  Procedure Date  . Coronary artery bypass graft 1610,9604    Original surgery in 1997 x 5; Redo CABG x 3 in 2010 including free RIMA to distal LAD, SVG to OM and SVG to left posterolateral  . Cardiac catheterization April 2010    Grafts were patent. EF is 40 to 45%  . Partial colectomy     due to colon abcess  . Knee surgery     left  . Carotid endarterectomy     right  . Appendectomy     History  Smoking status  . Never Smoker   Smokeless tobacco  . Never Used    History  Alcohol Use No    Family History  Problem Relation Age of Onset  . Heart attack Mother   . Heart attack Father   . Diabetes  Mother   . Diabetes Sister   . Diabetes Maternal Grandmother   . Diabetes Paternal Grandmother   . Colon cancer Neg Hx   . Stroke Sister     Reviw of Systems:  Reviewed in the HPI.  All other systems are negative.  Physical Exam: Blood pressure 134/66, pulse 74, height 5\' 5"  (1.651 m), weight 150 lb (68.04 kg), SpO2 96.00%. General: Well developed, well nourished, in no acute distress.  Head: Normocephalic, atraumatic, sclera non-icteric, mucus membranes are moist,   Neck: Supple. Carotids are 2 + without bruits. No JVD.  She has a right carotid endarterectomy scar.  Lungs: Clear bilaterally to auscultation.  Heart: regular rate.  normal  S1 S2. No murmurs, gallops or rubs.  Abdomen: Soft, non-tender, non-distended with normal bowel sounds. No hepatomegaly. No rebound/guarding. No masses.  Msk:  Strength and tone are normal  Extremities: No clubbing or cyanosis. No edema.  Distal pedal pulses are 2+ and equal bilaterally.  Neuro: Alert and oriented X 3. Moves all extremities spontaneously.  Psych:  Responds to questions appropriately with a normal affect.  ECG:  Assessment / Plan:

## 2012-06-12 ENCOUNTER — Other Ambulatory Visit: Payer: Self-pay | Admitting: Gastroenterology

## 2012-06-24 ENCOUNTER — Other Ambulatory Visit: Payer: Self-pay | Admitting: *Deleted

## 2012-06-24 MED ORDER — ROSUVASTATIN CALCIUM 20 MG PO TABS: 20.0000 mg | ORAL_TABLET | Freq: Every day | ORAL | Status: DC

## 2012-06-24 NOTE — Telephone Encounter (Signed)
Fax Received. Refill Completed. [name] (R.M.A)   

## 2012-08-20 ENCOUNTER — Encounter: Payer: Self-pay | Admitting: Family Medicine

## 2012-08-22 ENCOUNTER — Encounter: Payer: Self-pay | Admitting: Physician Assistant

## 2012-08-22 ENCOUNTER — Ambulatory Visit (INDEPENDENT_AMBULATORY_CARE_PROVIDER_SITE_OTHER): Payer: Medicare Other | Admitting: Physician Assistant

## 2012-08-22 VITALS — BP 132/76 | HR 76 | Temp 98.1°F | Resp 18 | Wt 157.0 lb

## 2012-08-22 DIAGNOSIS — R3129 Other microscopic hematuria: Secondary | ICD-10-CM

## 2012-08-22 DIAGNOSIS — R319 Hematuria, unspecified: Secondary | ICD-10-CM

## 2012-08-22 LAB — URINALYSIS, ROUTINE W REFLEX MICROSCOPIC
Glucose, UA: NEGATIVE mg/dL
Ketones, ur: NEGATIVE mg/dL
Leukocytes, UA: NEGATIVE
Nitrite: NEGATIVE
Protein, ur: 30 mg/dL — AB
Specific Gravity, Urine: 1.03 (ref 1.005–1.030)
Urobilinogen, UA: 0.2 mg/dL (ref 0.0–1.0)
pH: 5.5 (ref 5.0–8.0)

## 2012-08-22 LAB — URINALYSIS, MICROSCOPIC ONLY
Casts: NONE SEEN
Crystals: NONE SEEN

## 2012-08-22 NOTE — Progress Notes (Signed)
Patient ID: JANAIYA BEAUCHESNE MRN: [id number], DOB: 1929/04/22, 79 y.o. Date of Encounter: 08/22/2012, 1:49 PM    Chief Complaint:  Chief Complaint  Patient presents with  . follw up after UTI  . wants to check vit D levels     HPI: 79 y.o. year old female was seen here 08/06/12. At that time, she came in b/c nurse with insurance co had told her that her UA was abnormal and she needed to f/u here. At that time pt reported some urinary frequency but no dysuria, f/c, back pain etc.  At that time she also reported some vaginal itching/irritation but no discharge. UA showed blood, Nitrite, Leukocyte, and yeast. She was treate3d with Cipro and Diflucan. Culture was sent-cnfirmed UTI and sensitivity to cipro.  Pt here for f/u. Says the urinary frequency has definitely decreased/subsidied.  Has developed no new symptoms. Feels fine.    Home Meds: Current Outpatient Prescriptions on File Prior to Visit  Medication Sig Dispense Refill  . aspirin 325 MG tablet Take 325 mg by mouth daily.        . isosorbide mononitrate (IMDUR) 60 MG 24 hr tablet Take 1 tablet (60 mg total) by mouth daily.  30 tablet  6  . metoprolol (LOPRESSOR) 50 MG tablet Take 100 mg by mouth 2 (two) times daily.      Marland Kitchen NEXIUM 40 MG capsule TAKE ONE CAPSULE EVERY DAY BEFORE BREAKFAST  30 capsule  4  . nitroGLYCERIN (NITROSTAT) 0.4 MG SL tablet Place 1 tablet (0.4 mg total) under the tongue every 5 (five) minutes as needed.  90 tablet  1  . rosuvastatin (CRESTOR) 20 MG tablet Take 1 tablet (20 mg total) by mouth daily.  30 tablet  6  . Cholecalciferol (VITAMIN D PO) Take by mouth daily.         No current facility-administered medications on file prior to visit.    Allergies:  Allergies  Allergen Reactions  . Prochlorperazine Edisylate Anaphylaxis  . Phenergan (Promethazine Hcl) Other (See Comments)    Facial numbness  . Sulfonamide Derivatives Nausea And Vomiting  . Zetia (Ezetimibe) Nausea Only    And abdominal pain  .  Niacin Rash      Review of Systems: Constitutional: negative for chills, fever, night sweats, weight changes, or fatigue  HEENT: negative for vision changes, hearing loss, congestion, rhinorrhea, ST, epistaxis, or sinus pressure Cardiovascular: negative for chest pain or palpitations Respiratory: negative for hemoptysis, wheezing, shortness of breath, or cough Abdominal: negative for abdominal pain, nausea, vomiting, diarrhea, or constipation Dermatological: negative for rash Neurologic: negative for headache, dizziness, or syncope    Physical Exam: Blood pressure 132/76, pulse 76, temperature 98.1 F (36.7 C), temperature source Oral, resp. rate 18, weight 157 lb (71.215 kg)., Body mass index is 26.13 kg/(m^2). General: Well developed, well nourished, in no acute distress. Neck: Supple. No thyromegaly. Full ROM. No lymphadenopathy. Lungs: Clear bilaterally to auscultation without wheezes, rales, or rhonchi. Breathing is unlabored. Heart: RRR with S1 S2. No murmurs, rubs, or gallops appreciated. Abdomen: Soft, non-tender, non-distended with normoactive bowel sounds. No hepatomegaly. No rebound/guarding. No obvious abdominal masses. Msk:  Strength and tone normal for age. Extremities/Skin: Warm and dry. No clubbing or cyanosis. No edema. No rashes or suspicious lesions. Neuro: Alert and oriented X 3. Moves all extremities spontaneously. Gait is normal. CNII-XII grossly in tact. Psych:  Responds to questions appropriately with a normal affect.    Results for orders placed in visit on 08/22/12  URINALYSIS, ROUTINE W REFLEX MICROSCOPIC      Result Value Range   Color, Urine YELLOW  YELLOW   APPearance CLEAR  CLEAR   Specific Gravity, Urine 1.030  1.005 - 1.030   pH 5.5  5.0 - 8.0   Glucose, UA NEG  NEG mg/dL   Bilirubin Urine SMALL (*) NEG   Ketones, ur NEG  NEG mg/dL   Hgb urine dipstick MOD (*) NEG   Protein, ur 30 (*) NEG mg/dL   Urobilinogen, UA 0.2  0.0 - 1.0 mg/dL    Nitrite NEG  NEG   Leukocytes, UA NEG  NEG  URINALYSIS, MICROSCOPIC ONLY      Result Value Range   Squamous Epithelial / LPF RARE  RARE   Crystals NONE SEEN  NONE SEEN   Casts NONE SEEN  NONE SEEN   WBC, UA 0-2  <3 WBC/hpf   RBC / HPF 0-2  <3 RBC/hpf   Bacteria, UA RARE  RARE   Urine-Other SEE NOTE (*)      ASSESSMENT AND PLAN:  79 y.o. year old female with  1. Hematuria - Urinalysis, Routine w reflex microscopic  2. Hematuria, microscopic  Discussed with her that infection has resolved. Discussed that microscopic hematuria still present.  Discussed referral to urologist. (She reports no known h/o hematuria and that she has not seen urology in 20 years) She wants to come here again and recheck UA one more time prior to f/u with urology. She will return here to check another UA in 4 weeks. If blood still present  Then will refer to urology.  Discussed possible casues of hematuria, including cancer. Pt aware. [address] [place], [place], [facility] 08/22/2012 1:49 PM

## 2012-09-15 ENCOUNTER — Encounter: Payer: Self-pay | Admitting: Family Medicine

## 2012-09-17 ENCOUNTER — Encounter: Payer: Self-pay | Admitting: Physician Assistant

## 2012-09-17 ENCOUNTER — Ambulatory Visit (INDEPENDENT_AMBULATORY_CARE_PROVIDER_SITE_OTHER): Payer: Medicare Other | Admitting: Physician Assistant

## 2012-09-17 VITALS — BP 144/90 | HR 72 | Temp 97.5°F | Resp 18 | Wt 158.0 lb

## 2012-09-17 DIAGNOSIS — N760 Acute vaginitis: Secondary | ICD-10-CM

## 2012-09-17 DIAGNOSIS — F411 Generalized anxiety disorder: Secondary | ICD-10-CM

## 2012-09-17 DIAGNOSIS — R319 Hematuria, unspecified: Secondary | ICD-10-CM

## 2012-09-17 DIAGNOSIS — A499 Bacterial infection, unspecified: Secondary | ICD-10-CM

## 2012-09-17 DIAGNOSIS — L293 Anogenital pruritus, unspecified: Secondary | ICD-10-CM

## 2012-09-17 DIAGNOSIS — B9689 Other specified bacterial agents as the cause of diseases classified elsewhere: Secondary | ICD-10-CM

## 2012-09-17 DIAGNOSIS — N898 Other specified noninflammatory disorders of vagina: Secondary | ICD-10-CM

## 2012-09-17 LAB — WET PREP FOR TRICH, YEAST, CLUE
Trich, Wet Prep: NONE SEEN
Yeast Wet Prep HPF POC: NONE SEEN

## 2012-09-17 LAB — URINALYSIS, ROUTINE W REFLEX MICROSCOPIC
Bilirubin Urine: NEGATIVE
Glucose, UA: NEGATIVE mg/dL
Ketones, ur: NEGATIVE mg/dL
Leukocytes, UA: NEGATIVE
Nitrite: NEGATIVE
Protein, ur: NEGATIVE mg/dL
Specific Gravity, Urine: <1.005 — ABNORMAL LOW (ref 1.005–1.030)
Urobilinogen, UA: 0.2 mg/dL (ref 0.0–1.0)
pH: 6 (ref 5.0–8.0)

## 2012-09-17 LAB — URINALYSIS, MICROSCOPIC ONLY
Casts: NONE SEEN
Crystals: NONE SEEN

## 2012-09-17 MED ORDER — ALPRAZOLAM 0.25 MG PO TABS: 0.2500 mg | ORAL_TABLET | Freq: Two times a day (BID) | ORAL | Status: DC | PRN

## 2012-09-17 MED ORDER — METRONIDAZOLE 500 MG PO TABS: 500.0000 mg | ORAL_TABLET | Freq: Two times a day (BID) | ORAL | Status: AC

## 2012-09-18 NOTE — Progress Notes (Signed)
Patient ID: [name] MRN: [id number], DOB: [birth year], 79 y.o. Date of Encounter: 09/18/2012, 3:14 PM    Chief Complaint:  Chief Complaint  Patient presents with  . follow up    hematuria     HPI: 79 y.o. year old female here for:  1-F/U microscopic hematuria. 08/06/12 had OV with UA showing blood and infection. She was treated with Cipro. She had f/u U/A that still showed small amount hematuria. Decided to recheck once more today prior to urology referral. She hs seen no pink color to urine. Has no dysuria, frequency, urgency, f/c. 2-C/O vaginal itching and irritation. Has seen no discharge.  3-Gets very anxious riding in car on trips in city with lots of traffic. Going to [facility] Friday.Requests Xanax to use.     Home Meds: Current Outpatient Prescriptions on File Prior to Visit  Medication Sig Dispense Refill  . aspirin 325 MG tablet Take 325 mg by mouth daily.        . Cholecalciferol (VITAMIN D PO) Take by mouth daily.        . isosorbide mononitrate (IMDUR) 60 MG 24 hr tablet Take 1 tablet (60 mg total) by mouth daily.  30 tablet  6  . metoprolol (LOPRESSOR) 50 MG tablet Take 100 mg by mouth 2 (two) times daily.      Marland Kitchen NEXIUM 40 MG capsule TAKE ONE CAPSULE EVERY DAY BEFORE BREAKFAST  30 capsule  4  . nitroGLYCERIN (NITROSTAT) 0.4 MG SL tablet Place 1 tablet (0.4 mg total) under the tongue every 5 (five) minutes as needed.  90 tablet  1  . rosuvastatin (CRESTOR) 20 MG tablet Take 1 tablet (20 mg total) by mouth daily.  30 tablet  6   No current facility-administered medications on file prior to visit.    Allergies:  Allergies  Allergen Reactions  . Prochlorperazine Edisylate Anaphylaxis  . Phenergan (Promethazine Hcl) Other (See Comments)    Facial numbness  . Sulfonamide Derivatives Nausea And Vomiting  . Zetia (Ezetimibe) Nausea Only    And abdominal pain  . Niacin Rash      Review of Systems: See HPI for Pertinent ROS. All other ROS negative.   Physical  Exam: Blood pressure 144/90, pulse 72, temperature 97.5 F (36.4 C), temperature source Oral, resp. rate 18, weight 158 lb (71.668 kg)., Body mass index is 26.29 kg/(m^2). General: Well developed, well nourished,WF. in no acute distress. Lungs: Clear bilaterally to auscultation without wheezes, rales, or rhonchi. Breathing is unlabored. Heart: Regular rhythm. No murmurs, rubs, or gallops. Msk:  Strength and tone normal for age. Extremities/Skin: Warm and dry. No clubbing or cyanosis. No edema. No rashes or suspicious lesions. Neuro: Alert and oriented X 3. Moves all extremities spontaneously. Gait is normal. CNII-XII grossly in tact. Psych:  Responds to questions appropriately with a normal affect. Pelvic: She deferred pelvic exam. She did self-swab of vaginal area.   Results for orders placed in visit on 09/17/12  WET PREP FOR TRICH, YEAST, CLUE      Result Value Range   Yeast Wet Prep HPF POC NONE SEEN  NONE SEEN   Trich, Wet Prep NONE SEEN  NONE SEEN   Clue Cells Wet Prep HPF POC FEW (*) NONE SEEN   WBC, Wet Prep HPF POC FEW  NONE SEEN  URINALYSIS, ROUTINE W REFLEX MICROSCOPIC      Result Value Range   Color, Urine YELLOW  YELLOW   APPearance CLEAR  CLEAR   Specific Gravity, Urine <  1.005 (*) 1.005 - 1.030   pH 6.0  5.0 - 8.0   Glucose, UA NEG  NEG mg/dL   Bilirubin Urine NEG  NEG   Ketones, ur NEG  NEG mg/dL   Hgb urine dipstick SMALL (*) NEG   Protein, ur NEG  NEG mg/dL   Urobilinogen, UA 0.2  0.0 - 1.0 mg/dL   Nitrite NEG  NEG   Leukocytes, UA NEG  NEG  URINALYSIS, MICROSCOPIC ONLY      Result Value Range   Squamous Epithelial / LPF RARE  RARE   Crystals NONE SEEN  NONE SEEN   Casts NONE SEEN  NONE SEEN   WBC, UA 0-2  <3 WBC/hpf   RBC / HPF 0-2  <3 RBC/hpf   Bacteria, UA RARE  RARE     ASSESSMENT AND PLAN:  79 y.o. year old female with  1. Hematuria - Urinalysis, Routine w reflex microscopic Still with small amount of blood. She is agreeable to proceed with urology  referral. - Ambulatory referral to Urology  2. Anxiety state, unspecified - ALPRAZolam (XANAX) 0.25 MG tablet; Take 1 tablet (0.25 mg total) by mouth 2 (two) times daily as needed for anxiety.  Dispense: 20 tablet; Refill: 0  3. Vaginal itching - WET PREP FOR TRICH, YEAST, CLUE - metroNIDAZOLE (FLAGYL) 500 MG tablet; Take 1 tablet (500 mg total) by mouth 2 (two) times daily.  Dispense: 14 tablet; Refill: 0  4. Bacterial vaginosis - metroNIDAZOLE (FLAGYL) 500 MG tablet; Take 1 tablet (500 mg total) by mouth 2 (two) times daily.  Dispense: 14 tablet; Refill: 0   Signed, [address] [place], [place], [facility] 09/18/2012 3:14 PM

## 2012-09-19 ENCOUNTER — Ambulatory Visit: Payer: Medicare Other | Admitting: Physician Assistant

## 2012-10-06 ENCOUNTER — Ambulatory Visit (INDEPENDENT_AMBULATORY_CARE_PROVIDER_SITE_OTHER): Payer: Medicare Other | Admitting: *Deleted

## 2012-10-06 VITALS — BP 130/70

## 2012-10-06 DIAGNOSIS — I10 Essential (primary) hypertension: Secondary | ICD-10-CM

## 2012-10-06 NOTE — Progress Notes (Signed)
Patient ID: [name], female   DOB: [birth year], 79 y.o.   MRN: [id number] Pt came in today around 450pm stating she was feeling dizzy/lightheaded and wanted her BP Checked. Pt BP was 130/70 pt stated that her symptoms started two hours ago and feels better now but her husband wanted her to come in a get checked. I educated her on dehydration and to drink plenty of fluids pt agrees.

## 2012-11-09 ENCOUNTER — Other Ambulatory Visit: Payer: Self-pay | Admitting: Gastroenterology

## 2012-11-10 NOTE — Telephone Encounter (Signed)
Patient will need an office visit for further refills  

## 2012-11-12 ENCOUNTER — Encounter (INDEPENDENT_AMBULATORY_CARE_PROVIDER_SITE_OTHER): Payer: Medicare Other

## 2012-11-12 DIAGNOSIS — R0989 Other specified symptoms and signs involving the circulatory and respiratory systems: Secondary | ICD-10-CM

## 2012-11-13 ENCOUNTER — Telehealth: Payer: Self-pay | Admitting: Cardiovascular Disease

## 2012-11-13 ENCOUNTER — Other Ambulatory Visit: Payer: Self-pay | Admitting: *Deleted

## 2012-11-13 MED ORDER — METOPROLOL SUCCINATE ER 50 MG PO TB24: 50.0000 mg | ORAL_TABLET | Freq: Every day | ORAL | Status: DC

## 2012-11-13 MED ORDER — METOPROLOL TARTRATE 50 MG PO TABS: 100.0000 mg | ORAL_TABLET | Freq: Two times a day (BID) | ORAL | Status: DC

## 2012-11-13 NOTE — Telephone Encounter (Signed)
Fax Received. Refill Completed. Cayetano Mikita Chowoe (R.M.A)   

## 2012-11-13 NOTE — Telephone Encounter (Signed)
New Prob     Needs some clarification on dosage and directions for METOPROLOL. Please call.

## 2012-11-13 NOTE — Telephone Encounter (Signed)
I have spoken with [name] at [facility], the patient, and Mr. Sykora regarding the correct dosage of her Metoprolol AND whether this is tartrate or succinate. Her office notes & ED notes have stated different dosages as well as short acting vs long acting.  Her current bottle as read to me by her husband states: Metoprolol succinate 50mg  1 tablet at 10am and 1 tablet at 5pm daily. I have reconfirmed this with Melanie at [facility] & given refill ( as pt husband picked up 3 pills today until cleared up)  Mylo Red RN

## 2012-11-14 NOTE — Telephone Encounter (Signed)
Her HR and BP were normal at her last office visit.  She should continue the same dose that she was on at that time.

## 2012-11-17 NOTE — Telephone Encounter (Signed)
Pt informed

## 2012-12-03 ENCOUNTER — Other Ambulatory Visit (INDEPENDENT_AMBULATORY_CARE_PROVIDER_SITE_OTHER): Payer: Medicare Other

## 2012-12-03 DIAGNOSIS — I2510 Atherosclerotic heart disease of native coronary artery without angina pectoris: Secondary | ICD-10-CM

## 2012-12-03 DIAGNOSIS — E785 Hyperlipidemia, unspecified: Secondary | ICD-10-CM

## 2012-12-03 LAB — BASIC METABOLIC PANEL
BUN: 12 mg/dL (ref 6–23)
CO2: 26 mEq/L (ref 19–32)
Calcium: 9.2 mg/dL (ref 8.4–10.5)
Chloride: 107 mEq/L (ref 96–112)
Creatinine, Ser: 0.9 mg/dL (ref 0.4–1.2)
GFR: 66.27 mL/min (ref >60.00)
Glucose, Bld: 94 mg/dL (ref 70–99)
Potassium: 4.1 mEq/L (ref 3.5–5.1)
Sodium: 141 mEq/L (ref 135–145)

## 2012-12-03 LAB — LIPID PANEL
Cholesterol: 181 mg/dL (ref 0–200)
HDL: 112.8 mg/dL (ref >39.00)
LDL Cholesterol: 50 mg/dL (ref 0–99)
Total CHOL/HDL Ratio: 2
Triglycerides: 91 mg/dL (ref 0.0–149.0)
VLDL: 18.2 mg/dL (ref 0.0–40.0)

## 2012-12-03 LAB — HEPATIC FUNCTION PANEL
ALT: 13 U/L (ref 0–35)
AST: 18 U/L (ref 0–37)
Albumin: 3.6 g/dL (ref 3.5–5.2)
Alkaline Phosphatase: 49 U/L (ref 39–117)
Bilirubin, Direct: 0.1 mg/dL (ref 0.0–0.3)
Total Bilirubin: 0.5 mg/dL (ref 0.3–1.2)
Total Protein: 6.4 g/dL (ref 6.0–8.3)

## 2012-12-08 ENCOUNTER — Ambulatory Visit: Payer: Medicare Other | Admitting: Cardiovascular Disease

## 2012-12-15 ENCOUNTER — Other Ambulatory Visit: Payer: Self-pay | Admitting: Gastroenterology

## 2013-01-06 ENCOUNTER — Other Ambulatory Visit: Payer: Self-pay

## 2013-01-06 MED ORDER — ISOSORBIDE MONONITRATE ER 60 MG PO TB24: 60.0000 mg | ORAL_TABLET | Freq: Every day | ORAL | Status: DC

## 2013-01-13 ENCOUNTER — Other Ambulatory Visit: Payer: Self-pay | Admitting: Gastroenterology

## 2013-01-13 NOTE — Telephone Encounter (Signed)
PLEASE MAKE AN OFFICE VISIT FOR FURTHER REFILLS  

## 2013-01-22 ENCOUNTER — Other Ambulatory Visit: Payer: Self-pay | Admitting: *Deleted

## 2013-01-22 MED ORDER — ROSUVASTATIN CALCIUM 20 MG PO TABS: 20.0000 mg | ORAL_TABLET | Freq: Every day | ORAL | Status: DC

## 2013-02-03 ENCOUNTER — Ambulatory Visit (INDEPENDENT_AMBULATORY_CARE_PROVIDER_SITE_OTHER): Payer: Medicare Other | Admitting: Family Medicine

## 2013-02-03 DIAGNOSIS — Z23 Encounter for immunization: Secondary | ICD-10-CM

## 2013-02-09 ENCOUNTER — Encounter: Payer: Self-pay | Admitting: Cardiovascular Disease

## 2013-02-09 ENCOUNTER — Ambulatory Visit (INDEPENDENT_AMBULATORY_CARE_PROVIDER_SITE_OTHER): Payer: Medicare Other | Admitting: Cardiovascular Disease

## 2013-02-09 VITALS — BP 146/70 | HR 69 | Ht 65.0 in | Wt 155.0 lb

## 2013-02-09 DIAGNOSIS — I10 Essential (primary) hypertension: Secondary | ICD-10-CM

## 2013-02-09 DIAGNOSIS — R079 Chest pain, unspecified: Secondary | ICD-10-CM

## 2013-02-09 DIAGNOSIS — I2510 Atherosclerotic heart disease of native coronary artery without angina pectoris: Secondary | ICD-10-CM

## 2013-02-09 NOTE — Patient Instructions (Signed)
Your physician has requested that you have a lexiscan myoview.  Please follow instruction sheet, as given.  Your physician wants you to follow-up in: 6 months  You will receive a reminder letter in the mail two months in advance. If you don't receive a letter, please call our office to schedule the follow-up appointment.  Your physician recommends that you continue on your current medications as directed. Please refer to the Current Medication list given to you today.  

## 2013-02-09 NOTE — Assessment & Plan Note (Signed)
Her bP is well controlled.  Continue meds.  I have tried to get her out to exercise.

## 2013-02-09 NOTE — Progress Notes (Signed)
[name] Date of Birth  12-26-1928       [facility]    [place] [address]  [address] [place], [place]  [address]   [place], [place]  [address] [phone]     [phone]   Fax  [phone]    Fax [phone]  Problem List: 1. Coronary artery disease: She status post CABG in 1997 and again in 2010. Heart catheterization in April 2010 and revealed severe native coronary artery disease. The LAD is occluded after giving off 2 large septal branches. The left circumflex artery has moderate irregularities and has competitive flow from the saphenous vein graft to the acute marginal artery. The right coronary artery is occluded. The SVG to the first obtuse marginal has minor luminal irregularities. The free RIMA originates off the hood of the SVG. It is nice graft and anastomosis to the distal LAD is normal. The SVG to right coronary artery is smooth and normal. The posterior descending artery and the posterior lateral segment artery are normal. Her ejection fraction is 40-45%. 2. Congestive heart failure-EF of 40-45% 3. Hyperlipidemia 4. Peripheral vascular disease status post right carotid endarterectomy  History of Present Illness:  [name] is an 79 y.o. female with the above noted history.   She's not had any episodes of chest pain. She does note lots of fatigue and as a result she has not been exercising much on a treadmill. She gets out of breath community slight activity.  She's not had any syncope or presyncope.  June 09, 2012 She is doing well.  She is just getting over a bad cold.  She is not exercising much this winter.  She was walking about 45 minutes a day prior to the winter months.   She has had some muscular shoulder pain but no angina.   Sept. 14, 2015:  [name] is doing well.  No problems.  No angina.   She is enrolled in [employer] but doesn't go often.  She sits quite a bit during the day. She has some dyspena -  especially with exertion.   Current Outpatient Prescriptions on File Prior to Visit  Medication Sig Dispense Refill  . ALPRAZolam (XANAX) 0.25 MG tablet Take 1 tablet (0.25 mg total) by mouth 2 (two) times daily as needed for anxiety.  20 tablet  0  . aspirin 325 MG tablet Take 325 mg by mouth daily.        . Cholecalciferol (VITAMIN D PO) Take by mouth daily.        . isosorbide mononitrate (IMDUR) 60 MG 24 hr tablet Take 1 tablet (60 mg total) by mouth daily.  30 tablet  2  . metoprolol succinate (TOPROL-XL) 50 MG 24 hr tablet Take 1 tablet (50 mg total) by mouth daily. 1 tablet at 10am and 1 tablet at 5pm daily Confirmed with pt, husband, & pharmacy  60 tablet  3  . NEXIUM 40 MG capsule TAKE ONE CAPSULE BY MOUTH EVERY DAY BEFORE BREAKFAST  30 capsule  0  . nitroGLYCERIN (NITROSTAT) 0.4 MG SL tablet Place 1 tablet (0.4 mg total) under the tongue every 5 (five) minutes as needed.  90 tablet  1  . rosuvastatin (CRESTOR) 20 MG tablet Take 1 tablet (20 mg total) by mouth daily.  30 tablet  1   No current facility-administered medications on file prior to visit.    Allergies  Allergen Reactions  . Prochlorperazine Edisylate Anaphylaxis  .  Phenergan [Promethazine Hcl] Other (See Comments)    Facial numbness  . Sulfonamide Derivatives Nausea And Vomiting  . Zetia [Ezetimibe] Nausea Only    And abdominal pain  . Niacin Rash    Past Medical History  Diagnosis Date  . CAD (coronary artery disease)     s/p CABG x 5 in 1997 & CABG x 3 in 2010; Last cath in April of 2010 showed grafts to be patent. EF is 40 to 45%  . CHF (congestive heart failure)     EF is 40 to 45%  . Hyperlipemia   . Pleural effusion     post surgical  . PVD (peripheral vascular disease)     S/P right CEA  . GERD (gastroesophageal reflux disease)   . History of cerebrovascular disease   . Asthma   . Diverticulosis   . Depression     following bypass surgery  . Hypertension   . Hepatitis A   . Sleep apnea   .  Stroke 2010  . Endometriosis   . Allergy   . LBP (low back pain)   . Hiatal hernia     Past Surgical History  Procedure Laterality Date  . Coronary artery bypass graft  1610,9604    Original surgery in 1997 x 5; Redo CABG x 3 in 2010 including free RIMA to distal LAD, SVG to OM and SVG to left posterolateral  . Cardiac catheterization  April 2010    Grafts were patent. EF is 40 to 45%  . Partial colectomy      due to colon abcess  . Knee surgery      left  . Carotid endarterectomy      right  . Appendectomy      History  Smoking status  . Never Smoker   Smokeless tobacco  . Never Used    History  Alcohol Use No    Family History  Problem Relation Age of Onset  . Heart attack Mother   . Heart attack Father   . Diabetes Mother   . Diabetes Sister   . Diabetes Maternal Grandmother   . Diabetes Paternal Grandmother   . Colon cancer Neg Hx   . Stroke Sister     Reviw of Systems:  Reviewed in the HPI.  All other systems are negative.  Physical Exam: Blood pressure 146/70, pulse 69, height 5\' 5"  (1.651 m), SpO2 94.00%. General: Well developed, well nourished, in no acute distress.  Head: Normocephalic, atraumatic, sclera non-icteric, mucus membranes are moist,   Neck: Supple. Carotids are 2 + without bruits. No JVD.  She has a right carotid endarterectomy scar.  Lungs: Clear bilaterally to auscultation.  Heart: regular rate.  normal  S1 S2. No murmurs, gallops or rubs.  Abdomen: Soft, non-tender, non-distended with normal bowel sounds. No hepatomegaly. No rebound/guarding. No masses.  Msk:  Strength and tone are normal  Extremities: No clubbing or cyanosis. No edema.  Distal pedal pulses are 2+ and equal bilaterally.  Neuro: Alert and oriented X 3. Moves all extremities spontaneously.  Psych:  Responds to questions appropriately with a normal affect.  ECG:  Assessment / Plan:

## 2013-02-09 NOTE — Assessment & Plan Note (Addendum)
[name] has had some recurrent DOE.   This could be an angina equivalent.   Her last stress test was 2012 although she thought that she had a stress test earlier this years.   Will schedule her for a Lexiscan myoview .    I have encouraged her to exercise regularly.  She has lots of anxiety about getting out to exercise.

## 2013-02-10 ENCOUNTER — Telehealth: Payer: Self-pay | Admitting: Cardiovascular Disease

## 2013-02-10 NOTE — Telephone Encounter (Signed)
New Problem  Pt states she got home and found that she revieved a letter in the mail to come in for a lab in July. She says that the Dr. Lenna Gilford a lab on her visit yesterday, 9/15//. pt believes now that she doesn't needs a lab being that she has already had one??????? Please contact pt to clarify.

## 2013-02-10 NOTE — Telephone Encounter (Signed)
Answered questions about lab and future appts.

## 2013-02-11 ENCOUNTER — Other Ambulatory Visit: Payer: Self-pay | Admitting: Gastroenterology

## 2013-02-12 ENCOUNTER — Encounter: Payer: Self-pay | Admitting: Cardiovascular Disease

## 2013-02-18 ENCOUNTER — Ambulatory Visit: Payer: Medicare Other | Admitting: Cardiovascular Disease

## 2013-02-26 ENCOUNTER — Ambulatory Visit (HOSPITAL_COMMUNITY): Payer: Medicare Other | Attending: Cardiovascular Disease | Admitting: Radiology

## 2013-02-26 VITALS — BP 105/43 | HR 78 | Ht 65.0 in | Wt 154.0 lb

## 2013-02-26 DIAGNOSIS — Z01810 Encounter for preprocedural cardiovascular examination: Secondary | ICD-10-CM | POA: Insufficient documentation

## 2013-02-26 DIAGNOSIS — R0602 Shortness of breath: Secondary | ICD-10-CM

## 2013-02-26 DIAGNOSIS — I2510 Atherosclerotic heart disease of native coronary artery without angina pectoris: Secondary | ICD-10-CM

## 2013-02-26 DIAGNOSIS — R079 Chest pain, unspecified: Secondary | ICD-10-CM

## 2013-02-26 MED ORDER — TECHNETIUM TC 99M SESTAMIBI GENERIC - CARDIOLITE
33.0000 | Freq: Once | INTRAVENOUS | Status: AC | PRN
  Administered 2013-02-26: 33 via INTRAVENOUS

## 2013-02-26 MED ORDER — TECHNETIUM TC 99M SESTAMIBI GENERIC - CARDIOLITE
11.0000 | Freq: Once | INTRAVENOUS | Status: AC | PRN
  Administered 2013-02-26: 11 via INTRAVENOUS

## 2013-02-26 MED ORDER — ADENOSINE (DIAGNOSTIC) 3 MG/ML IV SOLN
0.5600 mg/kg | Freq: Once | INTRAVENOUS | Status: AC
  Administered 2013-02-26: 39 mg via INTRAVENOUS

## 2013-02-26 NOTE — Progress Notes (Signed)
[facility] SITE 3 NUCLEAR MED [address] [place], [place] [address] [phone]    Cardiology Nuclear Med Study  [name] is a 79 y.o. female     MRN : [id number]     DOB: 1929/04/26  Procedure Date: 02/26/2013  Nuclear Med Background Indication for Stress Test:  Evaluation for Ischemia and Graft Patency History:  '97 MI> CABG with in '10 redo CABG, 08-2008 Heart Catheterization-Patent Grafts, EF=45%, and 2012 Myocardial Perfusion Study-no ischemia, EF=76% Cardiac Risk Factors: Carotid,CVA, Family History - CAD, Hypertension, Lipids  Symptoms:  Chest Pain (last date of chest discomfort 2 months ago), Dizziness, DOE, Fatigue, Light-Headedness and Rapid HR   Nuclear Pre-Procedure Caffeine/Decaff Intake:  None NPO After: 8:00pm   Lungs:  clear O2 Sat: 91-95% % on room air. IV 0.9% NS with Angio Cath:  22g  IV Site: R Antecubital  IV Started by:  Bonnita Levan, RN  Chest Size (in):  34 Cup Size: B  Height: 5\' 5"  (1.651 m)  Weight:  154 lb (69.854 kg)  BMI:  Body mass index is 25.63 kg/(m^2). Tech Comments:  N/A    Nuclear Med Study 1 or 2 day study: 1 day  Stress Test Type:  Adenosine  Reading MD: Tobias Alexander, MD  Order Authorizing Provider:  Kristeen Miss, MD  Resting Radionuclide: Technetium 56m Sestamibi  Resting Radionuclide Dose: 11.0 mCi   Stress Radionuclide:  Technetium 46m Sestamibi  Stress Radionuclide Dose: 33.0 mCi           Stress Protocol Rest HR: 78 Stress HR: 96  Rest BP: 105/43 Stress BP: 108/40  Exercise Time (min): n/a METS: n/a   Predicted Max HR: 138 bpm % Max HR: 69.57 bpm Rate Pressure Product: 78469   Dose of Adenosine (mg):  39.2 Dose of Lexiscan: n/a mg  Dose of Atropine (mg): n/a Dose of Dobutamine: n/a mcg/kg/min (at max HR)  Stress Test Technologist: Irean Hong, RN  Nuclear Technologist:  Domenic Polite, CNMT     Rest Procedure:  Myocardial perfusion imaging was performed at rest 45 minutes following the  intravenous administration of Technetium 6m Sestamibi. Rest ECG: SR, Q in V1-4  Stress Procedure:  The patient received IV adenosine at 140 mcg/kg/min for 4 minutes.  Technetium 11m Sestamibi was injected at the 2 minute mark and quantitative spect images were obtained after a 45 minute delay.The patient complained of SOB, weakness, but denied chest pain with adenosine. Stress ECG: No significant change from baseline ECG  QPS Raw Data Images:  No gated images available. Stress Images:  There is a small size mild severity decrease uptake in the mid and basal anterolateral walls.  Rest Images:  Normal homogeneous uptake in all areas of the myocardium. Subtraction (SDS):  These findings are consistent with ischemia. Transient Ischemic Dilatation (Normal <1.22):  N/A Lung/Heart Ratio (Normal <0.45):  0.40  Quantitative Gated Spect Images QGS EDV:  N/A ml QGS ESV:  N/A ml  Impression Exercise Capacity:  Adenosine study with no exercise. BP Response:  Normal blood pressure response. Clinical Symptoms:  No significant symptoms noted. ECG Impression:  No significant ST segment change suggestive of ischemia. Comparison with Prior Nuclear Study: There is a change when compared to the prior study from 2012 that showed no ischemia.  Overall Impression:  Intermediate risk stress nuclear study with a small size mild intensity reversible defect in in the OM territory. SDS score 3. .  LV Ejection Fraction: Study not gated.  LV  Wall Motion:  No gated images available.  Tobias Alexander, H 02/26/2013

## 2013-03-05 ENCOUNTER — Other Ambulatory Visit: Payer: Self-pay | Admitting: Gastroenterology

## 2013-03-06 ENCOUNTER — Telehealth: Payer: Self-pay | Admitting: Cardiovascular Disease

## 2013-03-06 NOTE — Telephone Encounter (Signed)
Pt was given results but Dr Elease Hashimoto wants to review himself and pt will be called again after review, pt verbalized understanding.

## 2013-03-06 NOTE — Telephone Encounter (Signed)
New problem:  Pt's husband states he and his wife would like to know the results of [name]'s stress test.

## 2013-03-09 ENCOUNTER — Encounter: Payer: Self-pay | Admitting: *Deleted

## 2013-03-09 ENCOUNTER — Ambulatory Visit (INDEPENDENT_AMBULATORY_CARE_PROVIDER_SITE_OTHER): Payer: Medicare Other | Admitting: *Deleted

## 2013-03-09 ENCOUNTER — Telehealth: Payer: Self-pay | Admitting: *Deleted

## 2013-03-09 ENCOUNTER — Telehealth: Payer: Self-pay | Admitting: Cardiovascular Disease

## 2013-03-09 DIAGNOSIS — I219 Acute myocardial infarction, unspecified: Secondary | ICD-10-CM

## 2013-03-09 DIAGNOSIS — I2510 Atherosclerotic heart disease of native coronary artery without angina pectoris: Secondary | ICD-10-CM

## 2013-03-09 DIAGNOSIS — R9439 Abnormal result of other cardiovascular function study: Secondary | ICD-10-CM

## 2013-03-09 DIAGNOSIS — I10 Essential (primary) hypertension: Secondary | ICD-10-CM

## 2013-03-09 LAB — CBC WITH DIFFERENTIAL/PLATELET
Basophils Absolute: 0.1 10*3/uL (ref 0.0–0.1)
Basophils Relative: 0.6 % (ref 0.0–3.0)
Eosinophils Absolute: 0.1 10*3/uL (ref 0.0–0.7)
Eosinophils Relative: 1 % (ref 0.0–5.0)
HCT: 37.9 % (ref 36.0–46.0)
Hemoglobin: 13 g/dL (ref 12.0–15.0)
Lymphocytes Relative: 40.7 % (ref 12.0–46.0)
Lymphs Abs: 3.4 10*3/uL (ref 0.7–4.0)
MCHC: 34.4 g/dL (ref 30.0–36.0)
MCV: 89.2 fl (ref 78.0–100.0)
Monocytes Absolute: 0.7 10*3/uL (ref 0.1–1.0)
Monocytes Relative: 7.9 % (ref 3.0–12.0)
Neutro Abs: 4.1 10*3/uL (ref 1.4–7.7)
Neutrophils Relative %: 49.8 % (ref 43.0–77.0)
Platelets: 221 10*3/uL (ref 150.0–400.0)
RBC: 4.25 Mil/uL (ref 3.87–5.11)
RDW: 13.5 % (ref 11.5–14.6)
WBC: 8.3 10*3/uL (ref 4.5–10.5)

## 2013-03-09 LAB — BASIC METABOLIC PANEL
BUN: 12 mg/dL (ref 6–23)
CO2: 29 mEq/L (ref 19–32)
Calcium: 9.5 mg/dL (ref 8.4–10.5)
Chloride: 101 mEq/L (ref 96–112)
Creatinine, Ser: 0.8 mg/dL (ref 0.4–1.2)
GFR: 71.92 mL/min (ref >60.00)
Glucose, Bld: 100 mg/dL — ABNORMAL HIGH (ref 70–99)
Potassium: 4.3 mEq/L (ref 3.5–5.1)
Sodium: 140 mEq/L (ref 135–145)

## 2013-03-09 LAB — PROTIME-INR
INR: 1 ratio (ref 0.8–1.0)
Prothrombin Time: 10.8 s (ref 10.2–12.4)

## 2013-03-09 NOTE — Telephone Encounter (Signed)
Message copied by Antony Odea on Mon Mar 09, 2013 10:00 AM ------      Message from: [name], Deloris Ping      Created: Mon Mar 02, 2013  5:22 AM       There are some mild abnormalities.  I will review images and comment further.      Low - intermediate risk study.       ------

## 2013-03-09 NOTE — Telephone Encounter (Signed)
Pt told no fasting needed for today's labs.

## 2013-03-09 NOTE — Telephone Encounter (Signed)
Heart cath ordered

## 2013-03-09 NOTE — Telephone Encounter (Signed)
New message   Question about lab scheduled today

## 2013-03-09 NOTE — Telephone Encounter (Signed)
Pt needs heart cath/ test scheduled/ see letter, labs today.

## 2013-03-10 ENCOUNTER — Encounter (HOSPITAL_COMMUNITY): Payer: Self-pay | Admitting: Respiratory Therapy

## 2013-03-12 ENCOUNTER — Encounter (HOSPITAL_COMMUNITY)
Admission: RE | Disposition: A | Discharge status: Home / Self Care | Payer: Self-pay | Source: Ambulatory Visit | Attending: Cardiology

## 2013-03-12 ENCOUNTER — Other Ambulatory Visit: Payer: Self-pay | Admitting: Gastroenterology

## 2013-03-12 ENCOUNTER — Ambulatory Visit (HOSPITAL_COMMUNITY)
Admission: RE | Admit: 2013-03-12 | Discharge: 2013-03-12 | Disposition: A | Discharge status: Home / Self Care | Payer: Medicare Other | Source: Ambulatory Visit | Attending: Cardiology | Admitting: Cardiology

## 2013-03-12 DIAGNOSIS — K219 Gastro-esophageal reflux disease without esophagitis: Secondary | ICD-10-CM | POA: Insufficient documentation

## 2013-03-12 DIAGNOSIS — I10 Essential (primary) hypertension: Secondary | ICD-10-CM | POA: Insufficient documentation

## 2013-03-12 DIAGNOSIS — G4730 Sleep apnea, unspecified: Secondary | ICD-10-CM | POA: Insufficient documentation

## 2013-03-12 DIAGNOSIS — Z79899 Other long term (current) drug therapy: Secondary | ICD-10-CM | POA: Insufficient documentation

## 2013-03-12 DIAGNOSIS — R9439 Abnormal result of other cardiovascular function study: Secondary | ICD-10-CM | POA: Insufficient documentation

## 2013-03-12 DIAGNOSIS — K449 Diaphragmatic hernia without obstruction or gangrene: Secondary | ICD-10-CM | POA: Insufficient documentation

## 2013-03-12 DIAGNOSIS — Z7982 Long term (current) use of aspirin: Secondary | ICD-10-CM | POA: Insufficient documentation

## 2013-03-12 DIAGNOSIS — I2510 Atherosclerotic heart disease of native coronary artery without angina pectoris: Secondary | ICD-10-CM | POA: Insufficient documentation

## 2013-03-12 DIAGNOSIS — E785 Hyperlipidemia, unspecified: Secondary | ICD-10-CM | POA: Insufficient documentation

## 2013-03-12 DIAGNOSIS — J45909 Unspecified asthma, uncomplicated: Secondary | ICD-10-CM | POA: Insufficient documentation

## 2013-03-12 DIAGNOSIS — I509 Heart failure, unspecified: Secondary | ICD-10-CM | POA: Insufficient documentation

## 2013-03-12 DIAGNOSIS — J90 Pleural effusion, not elsewhere classified: Secondary | ICD-10-CM | POA: Insufficient documentation

## 2013-03-12 DIAGNOSIS — Z8673 Personal history of transient ischemic attack (TIA), and cerebral infarction without residual deficits: Secondary | ICD-10-CM | POA: Insufficient documentation

## 2013-03-12 DIAGNOSIS — N809 Endometriosis, unspecified: Secondary | ICD-10-CM | POA: Insufficient documentation

## 2013-03-12 DIAGNOSIS — I208 Other forms of angina pectoris: Secondary | ICD-10-CM | POA: Diagnosis present

## 2013-03-12 DIAGNOSIS — I25810 Atherosclerosis of coronary artery bypass graft(s) without angina pectoris: Secondary | ICD-10-CM | POA: Insufficient documentation

## 2013-03-12 HISTORY — PX: LEFT HEART CATHETERIZATION WITH CORONARY/GRAFT ANGIOGRAM: SHX5450

## 2013-03-12 SURGERY — LEFT HEART CATHETERIZATION WITH CORONARY/GRAFT ANGIOGRAM

## 2013-03-12 MED ORDER — LIDOCAINE HCL (PF) 1 % IJ SOLN
INTRAMUSCULAR | Status: AC
  Filled 2013-03-12: qty 30

## 2013-03-12 MED ORDER — ASPIRIN 81 MG PO CHEW: 81.0000 mg | CHEWABLE_TABLET | ORAL | Status: DC

## 2013-03-12 MED ORDER — MIDAZOLAM HCL 2 MG/2ML IJ SOLN
INTRAMUSCULAR | Status: AC
  Filled 2013-03-12: qty 2

## 2013-03-12 MED ORDER — ONDANSETRON HCL 4 MG/2ML IJ SOLN: 4.0000 mg | Freq: Four times a day (QID) | INTRAMUSCULAR | Status: DC | PRN

## 2013-03-12 MED ORDER — SODIUM CHLORIDE 0.9 % IJ SOLN: 3.0000 mL | Freq: Two times a day (BID) | INTRAMUSCULAR | Status: DC

## 2013-03-12 MED ORDER — SODIUM CHLORIDE 0.9 % IV SOLN
INTRAVENOUS | Status: DC
  Administered 2013-03-12: 06:00:00 via INTRAVENOUS

## 2013-03-12 MED ORDER — FENTANYL CITRATE 0.05 MG/ML IJ SOLN
INTRAMUSCULAR | Status: AC
  Filled 2013-03-12: qty 2

## 2013-03-12 MED ORDER — SODIUM CHLORIDE 0.9 % IV SOLN: 250.0000 mL | INTRAVENOUS | Status: DC | PRN

## 2013-03-12 MED ORDER — NITROGLYCERIN 0.2 MG/ML ON CALL CATH LAB
INTRAVENOUS | Status: AC
  Filled 2013-03-12: qty 1

## 2013-03-12 MED ORDER — SODIUM CHLORIDE 0.9 % IV SOLN: 1.0000 mL/kg/h | INTRAVENOUS | Status: DC

## 2013-03-12 MED ORDER — HEPARIN (PORCINE) IN NACL 2-0.9 UNIT/ML-% IJ SOLN
INTRAMUSCULAR | Status: AC
  Filled 2013-03-12: qty 1000

## 2013-03-12 MED ORDER — ACETAMINOPHEN 325 MG PO TABS: 650.0000 mg | ORAL_TABLET | ORAL | Status: DC | PRN

## 2013-03-12 MED ORDER — SODIUM CHLORIDE 0.9 % IJ SOLN: 3.0000 mL | INTRAMUSCULAR | Status: DC | PRN

## 2013-03-12 NOTE — CV Procedure (Signed)
   Cardiac Catheterization Procedure Note  Name: [name] MRN: [id number] DOB: 05-Dec-1928  Procedure: Left Heart Cath, saphenous vein graft angiography, Selective Coronary Angiography, LV angiography  Indication: 79 year old white female with history of coronary disease status post redo CABG in 2010. The patient had an atretic LIMA graft to the LAD prior to her last surgery. The redo bypass included a saphenous vein graft to the right coronary, saphenous vein graft to the OM1, and a free RIMA graft to the LAD with the proximal anastomosis in the hood of the vein graft to the OM. She presents now with dyspnea on exertion and fatigue. Myoview study was intermediate risk demonstrating ischemia in the lateral wall.   Procedural details: The right groin was prepped, draped, and anesthetized with 1% lidocaine. Using modified Seldinger technique, a 5 French sheath was introduced into the right femoral artery. Standard Judkins catheters were used for coronary angiography and left ventriculography. Catheter exchanges were performed over a guidewire. There were no immediate procedural complications. The patient was transferred to the post catheterization recovery area for further monitoring.  Procedural Findings: Hemodynamics:  AO 119/40  with a mean of 77 mmHg LV 118/11 mmHg   Coronary angiography: Coronary dominance: right  Left mainstem: There is eccentric 40-50% narrowing of the left main. The left main is relatively short.  Left anterior descending (LAD): The LAD is diffusely diseased in the proximal vessel and is occluded after the second septal perforator.  Left circumflex (LCx): The left circumflex is a large vessel that continues in the AV groove into a  posterior lateral branch. The first obtuse marginal vessel is occluded. There are left to left collaterals to the OM.  Right coronary artery (RCA): The right coronary is occluded proximally.  The free RIMA graft to the LAD is widely  patent with good runoff. There is mild angulation of this graft at the anastomosis with the vein graft hood.  The saphenous vein graft to the first OM is occluded proximally.  The saphenous vein graft to the right coronary is widely patent with good runoff.  Left ventriculography: Left ventricular systolic function is normal, LVEF is estimated at 55-65%, there is no significant mitral regurgitation   Final Conclusions:   1. 3 vessel occlusive coronary disease. 2. Patent free RIMA graft to the LAD 3. Patent SVG to the RCA. 4. Occluded saphenous vein graft to the OM1. 5. Normal LV function.  Recommendations: Recommend medical management.  Theron Arista [facility] 03/12/2013, 8:24 AM

## 2013-03-12 NOTE — Interval H&P Note (Signed)
History and Physical Interval Note:  03/12/2013 7:47 AM  [name]  has presented today for surgery, with the diagnosis of abnormal stress/cp  The various methods of treatment have been discussed with the patient and family. After consideration of risks, benefits and other options for treatment, the patient has consented to  Procedure(s): LEFT HEART CATHETERIZATION WITH CORONARY/GRAFT ANGIOGRAM (N/A) as a surgical intervention .  The patient's history has been reviewed, patient examined, no change in status, stable for surgery.  I have reviewed the patient's chart and labs.  Questions were answered to the patient's satisfaction.    Cath Lab Visit (complete for each Cath Lab visit)  Clinical Evaluation Leading to the Procedure:   ACS: no  Non-ACS:    Anginal Classification: CCS II  Anti-ischemic medical therapy: Maximal Therapy (2 or more classes of medications)  Non-Invasive Test Results: Intermediate-risk stress test findings: cardiac mortality 1-3%/year  Prior CABG: Previous CABG       Theron Arista [facility] 03/12/2013 7:47 AM

## 2013-03-12 NOTE — H&P (Signed)
History and Physical   Patient ID: [name] MRN: [id number], DOB/AGE: 11/25/1928 79 y.o. Date of Encounter: 03/12/2013  Primary Physician: Leo Grosser, MD Primary Cardiologist: Nahser  Chief Complaint:  Fatigue and abnl stress test  HPI: [name] is a 79 y.o. female with a history of CAD. She was having problems with fatigue and DOE. He recommended a nuclear stress test. The test results showed an intermediate risk stress nuclear study with a small size mild intensity reversible defect in in the OM territory. Because of this, she was scheduled for cath. She is here today for the procedure. The last episode of SOB was yesterday while riding on a bus.   Past Medical History  Diagnosis Date  . CAD (coronary artery disease)     s/p CABG x 5 in 1997 & CABG x 3 in 2010; Last cath in April of 2010 showed grafts to be patent. EF is 40 to 45%  . CHF (congestive heart failure)     EF is 40 to 45%  . Hyperlipemia   . Pleural effusion     post surgical  . PVD (peripheral vascular disease)     S/P right CEA  . GERD (gastroesophageal reflux disease)   . History of cerebrovascular disease   . Asthma   . Diverticulosis   . Depression     following bypass surgery  . Hypertension   . Hepatitis A   . Sleep apnea   . Stroke 2010  . Endometriosis   . Allergy   . LBP (low back pain)   . Hiatal hernia     Surgical History:  Past Surgical History  Procedure Laterality Date  . Coronary artery bypass graft  4098,1191    Original surgery in 1997 x 5; Redo CABG x 3 in 2010 including free RIMA to distal LAD, SVG to OM and SVG to left posterolateral  . Cardiac catheterization  April 2010    Grafts were patent. EF is 40 to 45%  . Partial colectomy      due to colon abcess  . Knee surgery      left  . Carotid endarterectomy      right  . Appendectomy       I have reviewed the patient's current medications. Prior to Admission medications   Medication Sig Start Date End  Date Taking? Authorizing Provider  ALPRAZolam (XANAX) 0.25 MG tablet Take 1 tablet (0.25 mg total) by mouth 2 (two) times daily as needed for anxiety. 09/17/12  Yes Dorena Bodo, PA-C  aspirin 325 MG tablet Take 325 mg by mouth daily.     Yes Historical Provider, MD  Calcium Carbonate-Vit D-Min (CALCIUM 1200 PO) Take 1,200 mg by mouth daily.   Yes Historical Provider, MD  Cholecalciferol (VITAMIN D PO) Take 1,000 Units by mouth daily.    Yes Historical Provider, MD  esomeprazole (NEXIUM) 40 MG capsule Take 40 mg by mouth daily before breakfast.   Yes Historical Provider, MD  isosorbide mononitrate (IMDUR) 60 MG 24 hr tablet Take 1 tablet (60 mg total) by mouth daily. 01/06/13  Yes Vesta Mixer, MD  metoprolol succinate (TOPROL-XL) 50 MG 24 hr tablet Take 1 tablet (50 mg total) by mouth daily. 1 tablet at 10am and 1 tablet at 5pm daily Confirmed with pt, husband, & pharmacy 11/13/12  Yes Vesta Mixer, MD  PRESCRIPTION MEDICATION Take 1 tablet by mouth daily. Pt. On a study medication   Yes  Historical Provider, MD  rosuvastatin (CRESTOR) 20 MG tablet Take 1 tablet (20 mg total) by mouth daily. 01/22/13  Yes Vesta Mixer, MD  nitroGLYCERIN (NITROSTAT) 0.4 MG SL tablet Place 1 tablet (0.4 mg total) under the tongue every 5 (five) minutes as needed. 03/29/11   Vesta Mixer, MD   Scheduled Meds: . aspirin  81 mg Oral Pre-Cath  . sodium chloride  3 mL Intravenous Q12H   Continuous Infusions: . sodium chloride 75 mL/hr at 03/12/13 0618   PRN Meds:.sodium chloride, sodium chloride  Allergies:  Allergies  Allergen Reactions  . Prochlorperazine Edisylate Anaphylaxis  . Phenergan [Promethazine Hcl] Other (See Comments)    Facial numbness  . Sulfonamide Derivatives Nausea And Vomiting  . Zetia [Ezetimibe] Nausea Only    And abdominal pain  . Niacin Rash    History   Social History  . Marital Status: Married    Spouse Name: N/A    Number of Children: 0  . Years of Education: N/A    Occupational History  . retired Runner, broadcasting/film/video    Social History Main Topics  . Smoking status: Never Smoker   . Smokeless tobacco: Never Used  . Alcohol Use: No  . Drug Use: No  . Sexual Activity: No   Other Topics Concern  . Not on file   Social History Narrative  . Lives with husban    Family History  Problem Relation Age of Onset  . Heart attack Mother   . Heart attack Father   . Diabetes Mother   . Diabetes Sister   . Diabetes Maternal Grandmother   . Diabetes Paternal Grandmother   . Colon cancer Neg Hx   . Stroke Sister    Family Status  Relation Status Death Age  . Mother Deceased 70  . Father Deceased 51    Review of Systems: MS pain and back problems. Overactive bladder. Full 14-point review of systems otherwise negative except as noted above.  Physical Exam: Blood pressure 132/64, pulse 64, temperature 97.5 F (36.4 C), temperature source Oral, resp. rate 18, height 5\' 5"  (1.651 m), weight 145 lb (65.772 kg), SpO2 98.00%. General: Well developed, well nourished,female in no acute distress. Head: Normocephalic, atraumatic, sclera non-icteric, no xanthomas, nares are without discharge. Dentition: poor Neck: No carotid bruits. JVD slightly elevated. No thyromegally Lungs: Good expansion bilaterally. without wheezes or rhonchi. Rales bases Heart: Regular rate and rhythm with S1 S2.  No S3 or S4.  No murmur, no rubs, or gallops appreciated. Abdomen: Soft, non-tender, non-distended with normoactive bowel sounds. No hepatomegaly. No rebound/guarding. No obvious abdominal masses. Msk:  Strength and tone appear normal for age. No joint deformities or effusions, no spine or costo-vertebral angle tenderness. Extremities: No clubbing or cyanosis. No edema.  Distal pedal pulses are 2+ in 4 extrem Neuro: Alert and oriented X 3. Moves all extremities spontaneously. No focal deficits noted. Psych:  Responds to questions appropriately with a normal affect. Skin: No rashes or  lesions noted  Labs:   Lab Results  Component Value Date   WBC 8.3 03/09/2013   HGB 13.0 03/09/2013   HCT 37.9 03/09/2013   MCV 89.2 03/09/2013   PLT 221.0 03/09/2013    Recent Labs  03/09/13 1235  INR 1.0     Recent Labs Lab 03/09/13 1235  NA 140  K 4.3  CL 101  CO2 29  BUN 12  CREATININE 0.8  CALCIUM 9.5  GLUCOSE 100*   ECG: SR, rate 66, no acute  ischemic changes  ASSESSMENT AND PLAN:  Principal Problem:    Angina decubitus - SOB is possibly anginal equivalent and stress test was abnormal. Cath today w/ further evaluation and treatment depending on the results.  Active Problems:   HYPERTENSION - follow on current Rx.   Melida Quitter, PA-C 03/12/2013 7:18 AM Beeper [phone]  Patient seen and examined and history reviewed. Agree with above findings and plan. Patient presents with dyspnea and fatigue. She has an intermediate risk myoview study. Will proceed with cardiac cath. The procedure and risks were reviewed including but not limited to death, myocardial infarction, stroke, arrythmias, bleeding, transfusion, emergency surgery, dye allergy, or renal dysfunction. The patient voices understanding and is agreeable to proceed.   Theron Arista [facility] 03/12/2013 7:46 AM

## 2013-03-24 ENCOUNTER — Ambulatory Visit (INDEPENDENT_AMBULATORY_CARE_PROVIDER_SITE_OTHER): Payer: Medicare Other | Admitting: Gastroenterology

## 2013-03-24 ENCOUNTER — Encounter: Payer: Self-pay | Admitting: Gastroenterology

## 2013-03-24 VITALS — BP 128/60 | HR 74 | Ht 65.0 in | Wt 154.5 lb

## 2013-03-24 DIAGNOSIS — K219 Gastro-esophageal reflux disease without esophagitis: Secondary | ICD-10-CM

## 2013-03-24 MED ORDER — ESOMEPRAZOLE MAGNESIUM 40 MG PO CPDR: 40.0000 mg | DELAYED_RELEASE_CAPSULE | Freq: Every day | ORAL | Status: DC

## 2013-03-24 NOTE — Progress Notes (Signed)
History of Present Illness: This is a very pleasant 79 year old Caucasian female who is completely asymptomatic on Nexium 40 mg at bedtime.  She denies any reflux symptoms or dysphagia.  She has regular bowel movements without melena or hematochezia.  She is up-to-date on her endoscopy and colonoscopy exams.  Her appetite is good her weight is stable.  He is followed closely by primary care and apparently has had normal labs.  Vital signs today are all normal with blood pressure 128/60, pulse 74 and regular, and weight 154 with a BMI of 25.71.    Current Medications, Allergies, Past Medical History, Past Surgical History, Family History and Social History were reviewed in [employer] record.  ROS: All systems were reviewed and are negative unless otherwise stated in the HPI.         Assessment and plan: Well controlled GERD on daily PPI therapy.  The patient is on supplemental calcium and vitamin D.  I reviewed a reflux regime with her, and I've asked her to continue her regular medication with yearly followup when necessary as needed in the past she has had esophageal strictures which have required esophageal dilatation.  Esophageal manometry was performed in 2007 and was unremarkable.  She currently denies any chest pain or dysphagia problems.

## 2013-03-24 NOTE — Patient Instructions (Signed)
Please follow up with Dr. Jarold Motto in one year  New prescription for Nexium was sent to your pharmacy

## 2013-03-26 ENCOUNTER — Other Ambulatory Visit: Payer: Self-pay

## 2013-03-26 MED ORDER — ISOSORBIDE MONONITRATE ER 60 MG PO TB24: 60.0000 mg | ORAL_TABLET | Freq: Every day | ORAL | Status: DC

## 2013-03-26 MED ORDER — METOPROLOL SUCCINATE ER 50 MG PO TB24: 50.0000 mg | ORAL_TABLET | Freq: Every day | ORAL | Status: DC

## 2013-03-26 MED ORDER — ROSUVASTATIN CALCIUM 20 MG PO TABS: 20.0000 mg | ORAL_TABLET | Freq: Every day | ORAL | Status: DC

## 2013-03-27 ENCOUNTER — Other Ambulatory Visit: Payer: Self-pay

## 2013-03-27 MED ORDER — METOPROLOL SUCCINATE ER 50 MG PO TB24: ORAL_TABLET | ORAL | Status: DC

## 2013-04-02 ENCOUNTER — Ambulatory Visit: Payer: Medicare Other | Admitting: Cardiovascular Disease

## 2013-04-06 ENCOUNTER — Ambulatory Visit (INDEPENDENT_AMBULATORY_CARE_PROVIDER_SITE_OTHER): Payer: Medicare Other | Admitting: Cardiovascular Disease

## 2013-04-06 ENCOUNTER — Encounter: Payer: Self-pay | Admitting: Cardiovascular Disease

## 2013-04-06 VITALS — BP 148/60 | HR 75 | Ht 65.0 in | Wt 154.0 lb

## 2013-04-06 DIAGNOSIS — I10 Essential (primary) hypertension: Secondary | ICD-10-CM

## 2013-04-06 DIAGNOSIS — I2510 Atherosclerotic heart disease of native coronary artery without angina pectoris: Secondary | ICD-10-CM

## 2013-04-06 NOTE — Assessment & Plan Note (Signed)
Seems to be doing well. She continues to have some mild shortness of breath. Recent cardiac catheterization revealed an occluded graft to the obtuse marginal system. Medical therapy was recommended. She seems to be doing well. We'll continue with the same medications. I'll see her again in 6 months for followup office visit.

## 2013-04-06 NOTE — Patient Instructions (Signed)
Your physician wants you to follow-up in: 6 MONTHS You will receive a reminder letter in the mail two months in advance. If you don't receive a letter, please call our office to schedule the follow-up appointment.  Your physician recommends that you return for a FASTING lipid profile: 6 MONTHS  Your physician recommends that you continue on your current medications as directed. Please refer to the Current Medication list given to you today.     

## 2013-04-06 NOTE — Progress Notes (Signed)
[name] Date of Birth  [birth year]       [facility]    [facility] [place] [facility] [address], [address]  [address] [place], [place]  [address]   [place]  [id number] [phone]     [phone]   Fax  [phone]    Fax [phone]  Problem List: 1. Coronary artery disease: She status post CABG in 1997 and again in 2010. Heart catheterization in April 2010 and revealed severe native coronary artery disease. The LAD is occluded after giving off 2 large septal branches. The left circumflex artery has moderate irregularities and has competitive flow from the saphenous vein graft to the acute marginal artery. The right coronary artery is occluded. The SVG to the first obtuse marginal has minor luminal irregularities. The free RIMA originates off the hood of the SVG. It is nice graft and anastomosis to the distal LAD is normal. The SVG to right coronary artery is smooth and normal. The posterior descending artery and the posterior lateral segment artery are normal. Her ejection fraction is 40-45%. 2. Congestive heart failure-EF of 40-45% 3. Hyperlipidemia 4. Peripheral vascular disease status post right carotid endarterectomy  History of Present Illness:  [name] is an 79 y.o. female with the above noted history.   She's not had any episodes of chest pain. She does note lots of fatigue and as a result she has not been exercising much on a treadmill. She gets out of breath community slight activity.  She's not had any syncope or presyncope.  June 09, 2012 She is doing well.  She is just getting over a bad cold.  She is not exercising much this winter.  She was walking about 45 minutes a day prior to the winter months.   She has had some muscular shoulder pain but no angina.   Sept. 14, 2015:  [name] is doing well.  No problems.  No angina.   She is enrolled in [employer] but doesn't go often.  She sits quite a bit during the day. She has some dyspena -  especially with exertion.   Nov. 10, 2014  She is doing well.  She walking and exercising .  No significant CP.  She had an abnormal myoview and subsequent cath revealed an occluded SVG to OM  Left mainstem: There is eccentric 40-50% narrowing of the left main. The left main is relatively short.  Left anterior descending (LAD): The LAD is diffusely diseased in the proximal vessel and is occluded after the second septal perforator.  Left circumflex (LCx): The left circumflex is a large vessel that continues in the AV groove into a posterior lateral branch. The first obtuse marginal vessel is occluded. There are left to left collaterals to the OM.  Right coronary artery (RCA): The right coronary is occluded proximally.  The free RIMA graft to the LAD is widely patent with good runoff. There is mild angulation of this graft at the anastomosis with the vein graft hood.  The saphenous vein graft to the first OM is occluded proximally.  The saphenous vein graft to the right coronary is widely patent with good runoff.  Left ventriculography: Left ventricular systolic function is normal, LVEF is estimated at 55-65%, there is no significant mitral regurgitation  Final Conclusions:  1. 3 vessel occlusive coronary disease.  2. Patent free RIMA graft to the LAD  3. Patent SVG to the RCA.  4. Occluded saphenous vein graft to the OM1.  5. Normal LV function.  Recommendations: Recommend medical management.  She seems to be doing well.   Current Outpatient Prescriptions on File Prior to Visit  Medication Sig Dispense Refill  . ALPRAZolam (XANAX) 0.25 MG tablet Take 1 tablet (0.25 mg total) by mouth 2 (two) times daily as needed for anxiety.  20 tablet  0  . aspirin 325 MG tablet Take 325 mg by mouth daily.        . Calcium Carbonate-Vit D-Min (CALCIUM 1200 PO) Take 1,200 mg by mouth daily.      . Cholecalciferol (VITAMIN D PO) Take 1,000 Units by mouth daily.       Marland Kitchen esomeprazole (NEXIUM) 40 MG  capsule Take 1 capsule (40 mg total) by mouth daily before breakfast.  30 capsule  11  . isosorbide mononitrate (IMDUR) 60 MG 24 hr tablet Take 1 tablet (60 mg total) by mouth daily.  90 tablet  3  . metoprolol succinate (TOPROL-XL) 50 MG 24 hr tablet 1 tablet at 10am and 1 tablet at 5pm daily Confirmed with pt, husband, & pharmacy  60 tablet  3  . nitroGLYCERIN (NITROSTAT) 0.4 MG SL tablet Place 1 tablet (0.4 mg total) under the tongue every 5 (five) minutes as needed.  90 tablet  1  . PRESCRIPTION MEDICATION Take 1 tablet by mouth daily. Pt. On a study medication      . rosuvastatin (CRESTOR) 20 MG tablet Take 1 tablet (20 mg total) by mouth daily.  30 tablet  1   No current facility-administered medications on file prior to visit.    Allergies  Allergen Reactions  . Prochlorperazine Edisylate Anaphylaxis  . Phenergan [Promethazine Hcl] Other (See Comments)    Facial numbness  . Sulfonamide Derivatives Nausea And Vomiting  . Zetia [Ezetimibe] Nausea Only    And abdominal pain  . Niacin Rash    Past Medical History  Diagnosis Date  . CAD (coronary artery disease)     s/p CABG x 5 in 1997 & CABG x 3 in 2010; Last cath in April of 2010 showed grafts to be patent. EF is 40 to 45%  . CHF (congestive heart failure)     EF is 40 to 45%  . Hyperlipemia   . Pleural effusion     post surgical  . PVD (peripheral vascular disease)     S/P right CEA  . GERD (gastroesophageal reflux disease)   . History of cerebrovascular disease   . Asthma   . Diverticulosis   . Depression     following bypass surgery  . Hypertension   . Hepatitis A   . Sleep apnea   . Stroke 2010  . Endometriosis   . Allergy   . LBP (low back pain)   . Hiatal hernia     Past Surgical History  Procedure Laterality Date  . Coronary artery bypass graft  8119,1478    Original surgery in 1997 x 5; Redo CABG x 3 in 2010 including free RIMA to distal LAD, SVG to OM and SVG to left posterolateral  . Cardiac  catheterization  April 2010    Grafts were patent. EF is 40 to 45%  . Partial colectomy      due to colon abcess  . Knee surgery      left  . Carotid endarterectomy      right  . Appendectomy      History  Smoking status  . Never Smoker   Smokeless tobacco  . Never Used  History  Alcohol Use No    Family History  Problem Relation Age of Onset  . Heart attack Mother   . Heart attack Father   . Diabetes Mother   . Diabetes Sister   . Diabetes Maternal Grandmother   . Diabetes Paternal Grandmother   . Colon cancer Neg Hx   . Stroke Sister     Reviw of Systems:  Reviewed in the HPI.  All other systems are negative.  Physical Exam: Blood pressure 148/60, pulse 75, height 5\' 5"  (1.651 m), weight 154 lb (69.854 kg). General: Well developed, well nourished, in no acute distress.  Head: Normocephalic, atraumatic, sclera non-icteric, mucus membranes are moist,   Neck: Supple. Carotids are 2 + without bruits. No JVD.  She has a right carotid endarterectomy scar.  Lungs: Clear bilaterally to auscultation.  Heart: regular rate.  normal  S1 S2. No murmurs, gallops or rubs.  Abdomen: Soft, non-tender, non-distended with normal bowel sounds. No hepatomegaly. No rebound/guarding. No masses.  Msk:  Strength and tone are normal  Extremities: No clubbing or cyanosis. No edema.  Distal pedal pulses are 2+ and equal bilaterally.  Neuro: Alert and oriented X 3. Moves all extremities spontaneously.  Psych:  Responds to questions appropriately with a normal affect.  ECG: Nov. 10, 2014:  NSR at 77 , occasional PVCs, LAE, LBBB Assessment / Plan:

## 2013-04-22 ENCOUNTER — Encounter: Payer: Self-pay | Admitting: Family Medicine

## 2013-04-22 DIAGNOSIS — N3946 Mixed incontinence: Secondary | ICD-10-CM | POA: Insufficient documentation

## 2013-04-22 DIAGNOSIS — R351 Nocturia: Secondary | ICD-10-CM

## 2013-04-22 HISTORY — DX: Nocturia: R35.1

## 2013-04-22 HISTORY — DX: Mixed incontinence: N39.46

## 2013-05-01 ENCOUNTER — Encounter: Payer: Self-pay | Admitting: Family Medicine

## 2013-05-01 ENCOUNTER — Ambulatory Visit (INDEPENDENT_AMBULATORY_CARE_PROVIDER_SITE_OTHER): Payer: Medicare Other | Admitting: Family Medicine

## 2013-05-01 VITALS — BP 120/60 | HR 68 | Temp 97.6°F | Resp 12 | Wt 154.0 lb

## 2013-05-01 DIAGNOSIS — M545 Low back pain, unspecified: Secondary | ICD-10-CM

## 2013-05-01 DIAGNOSIS — N390 Urinary tract infection, site not specified: Secondary | ICD-10-CM

## 2013-05-01 LAB — URINALYSIS, MICROSCOPIC ONLY: Crystals: NONE SEEN

## 2013-05-01 LAB — URINALYSIS, ROUTINE W REFLEX MICROSCOPIC
Glucose, UA: NEGATIVE mg/dL
Nitrite: POSITIVE — AB
Protein, ur: 30 mg/dL — AB
Specific Gravity, Urine: 1.025 (ref 1.005–1.030)
Urobilinogen, UA: 1 mg/dL (ref 0.0–1.0)
pH: 7 (ref 5.0–8.0)

## 2013-05-01 MED ORDER — CIPROFLOXACIN HCL 500 MG PO TABS: 500.0000 mg | ORAL_TABLET | Freq: Two times a day (BID) | ORAL | Status: DC

## 2013-05-01 NOTE — Addendum Note (Signed)
Addended by: WRAY, [place] on: 05/01/2013 01:23 PM   Modules accepted: Orders

## 2013-05-01 NOTE — Progress Notes (Signed)
Subjective:    Patient ID: [name], female    DOB: [birth year], 79 y.o.   MRN: [id number]  HPI Patient reports a one-day history of left lower back pain. History of by movement or twisting. It does not occur when she is still. She denies any frequency urgency dysuria or hematuria. Her urinalysis doesn't reveal a possible urinary tract infection shows no evidence of kidney stone. The pain does not radiate. She is tender to palpation of the lower latissimus dorsi on her left side. She denies any sciatica. There is no midline tenderness to palpation over the spinous processes of the lumbar spine. Past Medical History  Diagnosis Date  . CAD (coronary artery disease)     s/p CABG x 5 in 1997 & CABG x 3 in 2010; Last cath in April of 2010 showed grafts to be patent. EF is 40 to 45%  . CHF (congestive heart failure)     EF is 40 to 45%  . Hyperlipemia   . Pleural effusion     post surgical  . PVD (peripheral vascular disease)     S/P right CEA  . GERD (gastroesophageal reflux disease)   . History of cerebrovascular disease   . Asthma   . Diverticulosis   . Depression     following bypass surgery  . Hypertension   . Hepatitis A   . Sleep apnea   . Stroke 2010  . Endometriosis   . Allergy   . LBP (low back pain)   . Hiatal hernia   . Mixed incontinence urge and stress 04/22/2013  . Nocturia 04/22/2013   Current Outpatient Prescriptions on File Prior to Visit  Medication Sig Dispense Refill  . ALPRAZolam (XANAX) 0.25 MG tablet Take 1 tablet (0.25 mg total) by mouth 2 (two) times daily as needed for anxiety.  20 tablet  0  . aspirin 325 MG tablet Take 325 mg by mouth daily.        . Calcium Carbonate-Vit D-Min (CALCIUM 1200 PO) Take 1,200 mg by mouth daily.      . Cholecalciferol (VITAMIN D PO) Take 1,000 Units by mouth daily.       Marland Kitchen esomeprazole (NEXIUM) 40 MG capsule Take 1 capsule (40 mg total) by mouth daily before breakfast.  30 capsule  11  . isosorbide mononitrate (IMDUR)  60 MG 24 hr tablet Take 1 tablet (60 mg total) by mouth daily.  90 tablet  3  . metoprolol succinate (TOPROL-XL) 50 MG 24 hr tablet 1 tablet at 10am and 1 tablet at 5pm daily Confirmed with pt, husband, & pharmacy  60 tablet  3  . nitroGLYCERIN (NITROSTAT) 0.4 MG SL tablet Place 1 tablet (0.4 mg total) under the tongue every 5 (five) minutes as needed.  90 tablet  1  . PRESCRIPTION MEDICATION Take 1 tablet by mouth daily. Pt. On a study medication      . rosuvastatin (CRESTOR) 20 MG tablet Take 1 tablet (20 mg total) by mouth daily.  30 tablet  1   No current facility-administered medications on file prior to visit.   Allergies  Allergen Reactions  . Prochlorperazine Edisylate Anaphylaxis  . Phenergan [Promethazine Hcl] Other (See Comments)    Facial numbness  . Sulfonamide Derivatives Nausea And Vomiting  . Zetia [Ezetimibe] Nausea Only    And abdominal pain  . Niacin Rash   History   Social History  . Marital Status: Married    Spouse Name: N/A    Number  of Children: 0  . Years of Education: N/A   Occupational History  . retired Runner, broadcasting/film/video    Social History Main Topics  . Smoking status: Never Smoker   . Smokeless tobacco: Never Used  . Alcohol Use: No  . Drug Use: No  . Sexual Activity: No   Other Topics Concern  . Not on file   Social History Narrative  . No narrative on file      Review of Systems  All other systems reviewed and are negative.       Objective:   Physical Exam  Vitals reviewed. Cardiovascular: Normal rate and regular rhythm.   Pulmonary/Chest: Effort normal and breath sounds normal.  Abdominal: Soft. Bowel sounds are normal. She exhibits no distension. There is no tenderness. There is no rebound.  Musculoskeletal:       Lumbar back: She exhibits decreased range of motion, tenderness, pain and spasm. She exhibits no bony tenderness.          Assessment & Plan:  1. Low back pain Patient was concerned that this is a kidney stone.  However her pain only occurs with movement. It never occurs with rest. It is reproducible with palpation of her lower muscles in her back. Therefore I think it is a muscle pull. I recommended conservative measures to treat a pulled muscle. - Urinalysis, Routine w reflex microscopic  2. UTI (urinary tract infection) Her urinalysis appears to show a urinary tract infection. Begin Cipro 500 mg by mouth twice a day for 3 days. - ciprofloxacin (CIPRO) 500 MG tablet; Take 1 tablet (500 mg total) by mouth 2 (two) times daily.  Dispense: 6 tablet; Refill: 0

## 2013-05-03 ENCOUNTER — Encounter (HOSPITAL_COMMUNITY): Payer: Self-pay | Admitting: Emergency Medicine

## 2013-05-03 ENCOUNTER — Emergency Department (HOSPITAL_COMMUNITY): Payer: Medicare Other

## 2013-05-03 ENCOUNTER — Emergency Department (HOSPITAL_COMMUNITY)
Admission: EM | Admit: 2013-05-03 | Discharge: 2013-05-03 | Disposition: A | Discharge status: Home / Self Care | Payer: Medicare Other | Attending: Emergency Medicine | Admitting: Emergency Medicine

## 2013-05-03 DIAGNOSIS — Z951 Presence of aortocoronary bypass graft: Secondary | ICD-10-CM | POA: Insufficient documentation

## 2013-05-03 DIAGNOSIS — Z8742 Personal history of other diseases of the female genital tract: Secondary | ICD-10-CM | POA: Insufficient documentation

## 2013-05-03 DIAGNOSIS — Z87442 Personal history of urinary calculi: Secondary | ICD-10-CM | POA: Insufficient documentation

## 2013-05-03 DIAGNOSIS — K219 Gastro-esophageal reflux disease without esophagitis: Secondary | ICD-10-CM | POA: Insufficient documentation

## 2013-05-03 DIAGNOSIS — I509 Heart failure, unspecified: Secondary | ICD-10-CM | POA: Insufficient documentation

## 2013-05-03 DIAGNOSIS — Z95818 Presence of other cardiac implants and grafts: Secondary | ICD-10-CM | POA: Insufficient documentation

## 2013-05-03 DIAGNOSIS — R112 Nausea with vomiting, unspecified: Secondary | ICD-10-CM | POA: Insufficient documentation

## 2013-05-03 DIAGNOSIS — I10 Essential (primary) hypertension: Secondary | ICD-10-CM | POA: Insufficient documentation

## 2013-05-03 DIAGNOSIS — Z79899 Other long term (current) drug therapy: Secondary | ICD-10-CM | POA: Insufficient documentation

## 2013-05-03 DIAGNOSIS — M549 Dorsalgia, unspecified: Secondary | ICD-10-CM

## 2013-05-03 DIAGNOSIS — Z792 Long term (current) use of antibiotics: Secondary | ICD-10-CM | POA: Insufficient documentation

## 2013-05-03 DIAGNOSIS — M545 Low back pain, unspecified: Secondary | ICD-10-CM | POA: Insufficient documentation

## 2013-05-03 DIAGNOSIS — E785 Hyperlipidemia, unspecified: Secondary | ICD-10-CM | POA: Insufficient documentation

## 2013-05-03 DIAGNOSIS — R1032 Left lower quadrant pain: Secondary | ICD-10-CM | POA: Insufficient documentation

## 2013-05-03 DIAGNOSIS — I2510 Atherosclerotic heart disease of native coronary artery without angina pectoris: Secondary | ICD-10-CM | POA: Insufficient documentation

## 2013-05-03 DIAGNOSIS — F329 Major depressive disorder, single episode, unspecified: Secondary | ICD-10-CM | POA: Insufficient documentation

## 2013-05-03 DIAGNOSIS — Z8619 Personal history of other infectious and parasitic diseases: Secondary | ICD-10-CM | POA: Insufficient documentation

## 2013-05-03 DIAGNOSIS — Z7982 Long term (current) use of aspirin: Secondary | ICD-10-CM | POA: Insufficient documentation

## 2013-05-03 DIAGNOSIS — J45909 Unspecified asthma, uncomplicated: Secondary | ICD-10-CM | POA: Insufficient documentation

## 2013-05-03 DIAGNOSIS — F3289 Other specified depressive episodes: Secondary | ICD-10-CM | POA: Insufficient documentation

## 2013-05-03 DIAGNOSIS — Z8673 Personal history of transient ischemic attack (TIA), and cerebral infarction without residual deficits: Secondary | ICD-10-CM | POA: Insufficient documentation

## 2013-05-03 LAB — POCT I-STAT, CHEM 8
BUN: 14 mg/dL (ref 6–23)
Calcium, Ion: 1.25 mmol/L (ref 1.13–1.30)
Chloride: 101 mEq/L (ref 96–112)
Creatinine, Ser: 0.9 mg/dL (ref 0.50–1.10)
Glucose, Bld: 110 mg/dL — ABNORMAL HIGH (ref 70–99)
HCT: 45 % (ref 36.0–46.0)
Hemoglobin: 15.3 g/dL — ABNORMAL HIGH (ref 12.0–15.0)
Potassium: 3.3 mEq/L — ABNORMAL LOW (ref 3.5–5.1)
Sodium: 139 mEq/L (ref 135–145)
TCO2: 25 mmol/L (ref 0–100)

## 2013-05-03 LAB — CBC WITH DIFFERENTIAL/PLATELET
Basophils Absolute: 0 10*3/uL (ref 0.0–0.1)
Basophils Relative: 0 % (ref 0–1)
Eosinophils Absolute: 0.1 10*3/uL (ref 0.0–0.7)
Eosinophils Relative: 1 % (ref 0–5)
HCT: 41.2 % (ref 36.0–46.0)
Hemoglobin: 14.2 g/dL (ref 12.0–15.0)
Lymphocytes Relative: 33 % (ref 12–46)
Lymphs Abs: 3.3 10*3/uL (ref 0.7–4.0)
MCH: 30.7 pg (ref 26.0–34.0)
MCHC: 34.5 g/dL (ref 30.0–36.0)
MCV: 89.2 fL (ref 78.0–100.0)
Monocytes Absolute: 0.8 10*3/uL (ref 0.1–1.0)
Monocytes Relative: 8 % (ref 3–12)
Neutro Abs: 5.8 10*3/uL (ref 1.7–7.7)
Neutrophils Relative %: 58 % (ref 43–77)
Platelets: 227 10*3/uL (ref 150–400)
RBC: 4.62 MIL/uL (ref 3.87–5.11)
RDW: 12.9 % (ref 11.5–15.5)
WBC: 10.1 10*3/uL (ref 4.0–10.5)

## 2013-05-03 LAB — URINALYSIS, ROUTINE W REFLEX MICROSCOPIC
Glucose, UA: NEGATIVE mg/dL
Hgb urine dipstick: NEGATIVE
Ketones, ur: NEGATIVE mg/dL
Nitrite: NEGATIVE
Protein, ur: NEGATIVE mg/dL
Specific Gravity, Urine: 1.036 — ABNORMAL HIGH (ref 1.005–1.030)
Urobilinogen, UA: 1 mg/dL (ref 0.0–1.0)
pH: 5 (ref 5.0–8.0)

## 2013-05-03 LAB — URINE MICROSCOPIC-ADD ON

## 2013-05-03 MED ORDER — DIAZEPAM 5 MG PO TABS: 5.0000 mg | ORAL_TABLET | Freq: Two times a day (BID) | ORAL | Status: DC

## 2013-05-03 MED ORDER — ONDANSETRON HCL 4 MG/2ML IJ SOLN
4.0000 mg | Freq: Once | INTRAMUSCULAR | Status: AC
  Administered 2013-05-03: 4 mg via INTRAVENOUS
  Filled 2013-05-03: qty 2

## 2013-05-03 MED ORDER — MORPHINE SULFATE 4 MG/ML IJ SOLN
4.0000 mg | Freq: Once | INTRAMUSCULAR | Status: AC
  Administered 2013-05-03: 4 mg via INTRAVENOUS
  Filled 2013-05-03: qty 1

## 2013-05-03 MED ORDER — CYCLOBENZAPRINE HCL 5 MG PO TABS: 5.0000 mg | ORAL_TABLET | Freq: Three times a day (TID) | ORAL | Status: DC | PRN

## 2013-05-03 NOTE — ED Provider Notes (Signed)
Pt was seen by Dr Fredderick Phenix.  Pt was given rx for valium for back spasms.  Pt told nurse that she could not take that medication.  Requested alternative muscle relaxant.  Rx for flexeril given.  Celene Kras, MD 05/03/13 [phone]

## 2013-05-03 NOTE — ED Provider Notes (Addendum)
CSN: [id number]     Arrival date & time 05/03/13  1220 History   First MD Initiated Contact with Patient 05/03/13 1348     Chief Complaint  Patient presents with  . Back Pain   (Consider location/radiation/quality/duration/timing/severity/associated sxs/prior Treatment) HPI Comments: Patient presents with a three-day history of low back pain. She describes as intermittent sharp pain. It goes across her abdomen that times it radiates to her left lower quadrant. It does seem to be worse with certain movements such as bending over. She denies any radiation down her legs. She denies any numbness or weakness in her legs. She had some vomiting with it first started but hasn't had any since that time. She denies any urinary symptoms. She denies he fevers or chills. She saw her primary care physician 2 days ago and was diagnosed with possible muscle spasm versus pinched nerve and was advised to use Cipro and Tylenol for pain. She denies a history of similar pain in the past. She does have a history of a renal stone but denies passing any kidney stones.  Patient is a 79 y.o. female presenting with back pain.  Back Pain Associated symptoms: no abdominal pain, no chest pain, no fever, no headaches, no numbness and no weakness     Past Medical History  Diagnosis Date  . CAD (coronary artery disease)     s/p CABG x 5 in 1997 & CABG x 3 in 2010; Last cath in April of 2010 showed grafts to be patent. EF is 40 to 45%  . CHF (congestive heart failure)     EF is 40 to 45%  . Hyperlipemia   . Pleural effusion     post surgical  . PVD (peripheral vascular disease)     S/P right CEA  . GERD (gastroesophageal reflux disease)   . History of cerebrovascular disease   . Asthma   . Diverticulosis   . Depression     following bypass surgery  . Hypertension   . Hepatitis A   . Sleep apnea   . Stroke 2010  . Endometriosis   . Allergy   . LBP (low back pain)   . Hiatal hernia   . Mixed incontinence urge  and stress 04/22/2013  . Nocturia 04/22/2013   Past Surgical History  Procedure Laterality Date  . Coronary artery bypass graft  4098,1191    Original surgery in 1997 x 5; Redo CABG x 3 in 2010 including free RIMA to distal LAD, SVG to OM and SVG to left posterolateral  . Cardiac catheterization  April 2010    Grafts were patent. EF is 40 to 45%  . Partial colectomy      due to colon abcess  . Knee surgery      left  . Carotid endarterectomy      right  . Appendectomy     Family History  Problem Relation Age of Onset  . Heart attack Mother   . Heart attack Father   . Diabetes Mother   . Diabetes Sister   . Diabetes Maternal Grandmother   . Diabetes Paternal Grandmother   . Colon cancer Neg Hx   . Stroke Sister    History  Substance Use Topics  . Smoking status: Never Smoker   . Smokeless tobacco: Never Used  . Alcohol Use: No   OB History   Grav Para Term Preterm Abortions TAB SAB Ect Mult Living  Review of Systems  Constitutional: Negative for fever, chills, diaphoresis and fatigue.  HENT: Negative for congestion, rhinorrhea and sneezing.   Eyes: Negative.   Respiratory: Negative for cough, chest tightness and shortness of breath.   Cardiovascular: Negative for chest pain and leg swelling.  Gastrointestinal: Positive for nausea and vomiting. Negative for abdominal pain, diarrhea and blood in stool.  Genitourinary: Positive for flank pain. Negative for frequency, hematuria and difficulty urinating.  Musculoskeletal: Positive for back pain. Negative for arthralgias.  Skin: Negative for rash.  Neurological: Negative for dizziness, speech difficulty, weakness, numbness and headaches.    Allergies  Prochlorperazine edisylate; Phenergan; Sulfonamide derivatives; Zetia; and Niacin  Home Medications   Current Outpatient Rx  Name  Route  Sig  Dispense  Refill  . ALPRAZolam (XANAX) 0.25 MG tablet   Oral   Take 1 tablet (0.25 mg total) by mouth 2  (two) times daily as needed for anxiety.   20 tablet   0   . aspirin EC 81 MG tablet   Oral   Take 81 mg by mouth daily.         . Calcium Carbonate-Vit D-Min (CALCIUM 1200 PO)   Oral   Take 1,200 mg by mouth daily.         . Cholecalciferol (VITAMIN D PO)   Oral   Take 1,000 Units by mouth daily.          . ciprofloxacin (CIPRO) 500 MG tablet   Oral   Take 1 tablet (500 mg total) by mouth 2 (two) times daily.   6 tablet   0   . esomeprazole (NEXIUM) 40 MG capsule   Oral   Take 1 capsule (40 mg total) by mouth daily before breakfast.   30 capsule   11   . HYDROcodone-acetaminophen (NORCO) 10-325 MG per tablet   Oral   Take 1 tablet by mouth every 6 (six) hours as needed for moderate pain.         . isosorbide mononitrate (IMDUR) 60 MG 24 hr tablet   Oral   Take 1 tablet (60 mg total) by mouth daily.   90 tablet   3     .   . metoprolol succinate (TOPROL-XL) 50 MG 24 hr tablet      1 tablet at 10am and 1 tablet at 5pm daily Confirmed with pt, husband, & pharmacy   60 tablet   3   . nitroGLYCERIN (NITROSTAT) 0.4 MG SL tablet   Sublingual   Place 1 tablet (0.4 mg total) under the tongue every 5 (five) minutes as needed.   90 tablet   1   . PRESCRIPTION MEDICATION   Oral   Take 1 tablet by mouth daily. Pt. On a study medication         . rosuvastatin (CRESTOR) 20 MG tablet   Oral   Take 1 tablet (20 mg total) by mouth daily.   30 tablet   1   . diazepam (VALIUM) 5 MG tablet   Oral   Take 1 tablet (5 mg total) by mouth 2 (two) times daily.   10 tablet   0    BP 118/53  Pulse 63  Temp(Src) 97.9 F (36.6 C) (Oral)  Resp 14  SpO2 93% Physical Exam  Constitutional: She is oriented to person, place, and time. She appears well-developed and well-nourished.  HENT:  Head: Normocephalic and atraumatic.  Eyes: Pupils are equal, round, and reactive to light.  Neck: Normal range of  motion. Neck supple.  Cardiovascular: Normal rate and normal  heart sounds.  An irregular rhythm present.  Pulmonary/Chest: Effort normal and breath sounds normal. No respiratory distress. She has no wheezes. She has no rales. She exhibits no tenderness.  Abdominal: Soft. Bowel sounds are normal. There is tenderness (Mild tenderness to the left lower quadrant). There is no rebound and no guarding.  Musculoskeletal: Normal range of motion. She exhibits no edema.  Positive tenderness across the left flank. There is no pain along the lumbar spine. There some tenderness to the musculature in the left lower back. There's no pain over the sciatic nerve. Negative straight leg raise bilaterally. She has normal sensation in the feet. Normal motor function in the feet.  Lymphadenopathy:    She has no cervical adenopathy.  Neurological: She is alert and oriented to person, place, and time.  Skin: Skin is warm and dry. No rash noted.  Psychiatric: She has a normal mood and affect.    ED Course  Procedures (including critical care time) Labs Review Labs Reviewed  URINALYSIS, ROUTINE W REFLEX MICROSCOPIC - Abnormal; Notable for the following:    Color, Urine AMBER (*)    APPearance CLOUDY (*)    Specific Gravity, Urine 1.036 (*)    Bilirubin Urine SMALL (*)    Leukocytes, UA TRACE (*)    All other components within normal limits  POCT I-STAT, CHEM 8 - Abnormal; Notable for the following:    Potassium 3.3 (*)    Glucose, Bld 110 (*)    Hemoglobin 15.3 (*)    All other components within normal limits  CBC WITH DIFFERENTIAL  URINE MICROSCOPIC-ADD ON   Imaging Review Ct Abdomen Pelvis Wo Contrast  05/03/2013   CLINICAL DATA:  Back pain for 3 days.  History of kidney stones.  EXAM: CT ABDOMEN AND PELVIS WITHOUT CONTRAST  TECHNIQUE: Multidetector CT imaging of the abdomen and pelvis was performed following the standard protocol without intravenous contrast.  COMPARISON:  [facility] CT ABDOMEN AND PELVIS from 10/14/2012.  FINDINGS: No focal  abnormality is seen in the liver or spleen on this study performed without intravenous contrast material. A small hiatal hernia is noted. Stomach is otherwise unremarkable. Duodenum, pancreas, and adrenal glands are unremarkable. Tiny calcified stones are seen in the gallbladder lumen. No evidence for adrenal mass.  No stones in the right kidney. 2 x 5 mm nonobstructing stone is identified in the interpolar region of the left kidney, not substantially changed. 1.8 cm water density lesion in the interpolar left kidney is stable and compatible with a cyst. There is no hydronephrosis or secondary change in either kidney. No ureteral or bladder stones.  Dense atherosclerotic calcification is seen in the wall of the abdominal aorta without aneurysm. There is no free fluid or lymphadenopathy in the abdomen.  Imaging through the pelvis shows no free intraperitoneal fluid. There is no pelvic sidewall lymphadenopathy. Uterus is unremarkable. There is no adnexal mass.  Prominent diverticular disease is seen in the colon diffusely without evidence for diverticulitis. The terminal ileum is normal. The appendix is not visualized, but there is no edema or inflammation in the region of the cecum.  Bone windows reveal no worrisome lytic or sclerotic osseous lesions.  IMPRESSION: Stable exam. 2 x 5 mm nonobstructing left renal stone without evidence for other urinary stone disease. No secondary changes in either kidney.  Atherosclerosis.   Electronically Signed   By: Kennith Center M.D.   On: 05/03/2013 16:03  EKG Interpretation    Date/Time:  Sunday May 03 2013 14:29:51 EST Ventricular Rate:  69 PR Interval:  162 QRS Duration: 123 QT Interval:  442 QTC Calculation: 473 R Axis:   -68 Text Interpretation:  Sinus rhythm Nonspecific IVCD with LAD Anterior infarct, old Nonspecific T abnormalities, lateral leads since last tracing no significant change Confirmed by Kerah Hardebeck  MD, Cyndal Kasson ([id number]) on 05/03/2013 2:52:43  PM            MDM   1. Back pain    Patient has no evidence of ureteral stones. Her urinalysis looks improved from the last one was done. She was given dose of morphine in the ED for her pain. She has hydrocodone 10 mg tablets to use at home but she says that is not controlling the pain. She has some component of spasm so I will go ahead and give her prescription for small amount of Valium to try. I advised her and her husband not to take the hydrocodone and Valium together until she knows how it affects her. She is to call tomorrow to followup with her orthopedist.    Rolan Bucco, MD 05/03/13 1621  Rolan Bucco, MD 05/03/13 [phone]

## 2013-05-03 NOTE — ED Notes (Signed)
Pt from home via EMS c/o back pain. Pt was seen at her PCP 2 days ago, was dx with muscular pain, given Hydrocodone. Pt states that back pain is worse and pain meds are ineffective. Pt is currently being tx for UTI also.

## 2013-05-03 NOTE — ED Notes (Signed)
Pt attempted to esign, electronic signature pad not working in this room

## 2013-05-03 NOTE — ED Notes (Signed)
Pt from with back pain that started 3 days ago. Pt reports difficulty ambulating since. Pt has been dx with kidney stones 12 weeks ago and was told she should pass it, but doesn't know if she has or not. Pt is A&O and in NAD

## 2013-05-03 NOTE — ED Notes (Signed)
Bed: WA02 Expected date:  Expected time:  Means of arrival:  Comments: EMS-back pain 

## 2013-05-04 LAB — URINE CULTURE: Colony Count: >=100000

## 2013-05-19 ENCOUNTER — Telehealth: Payer: Self-pay | Admitting: *Deleted

## 2013-05-19 ENCOUNTER — Telehealth: Payer: Self-pay | Admitting: Cardiovascular Disease

## 2013-05-19 NOTE — Telephone Encounter (Signed)
Pt dropped off paperwork from an ED visit from [facility]. Dr Elease Hashimoto reviewed chart/ meds/ last cath and new ED paperwork. msg was left for pt that if she is continuing to have cp to call for an app. Number was provided in msg.

## 2013-05-19 NOTE — Telephone Encounter (Signed)
Walk In pt Form " ER papers Dropped Off" gave to [facility]

## 2013-05-27 ENCOUNTER — Other Ambulatory Visit: Payer: Self-pay

## 2013-05-27 MED ORDER — ROSUVASTATIN CALCIUM 20 MG PO TABS: 20.0000 mg | ORAL_TABLET | Freq: Every day | ORAL | Status: DC

## 2013-07-08 ENCOUNTER — Telehealth: Payer: Self-pay | Admitting: Cardiovascular Disease

## 2013-07-08 NOTE — Telephone Encounter (Signed)
New Problem:  Pt states she had heart surgery previously and ever since then her hair has been falling out.Marland Kitchen. She went to the pharmacy and got "Women's Hair Growth - Regrowth treatment"... Pt states the product states to consult w/ your doctor if you have heart issues... Pt would like a call back

## 2013-07-08 NOTE — Telephone Encounter (Signed)
Patient states she wants to use Women's Hair Growth-Regrowth Treatment and the package insert indicates she should notify her cardiologist. Dr. Elease HashimotoNahser stated that she is clear to use it, however he wants to have her come in 7-10 days after using it so that her BP can be checked and see how she is doing in regards to cardiac issues.  Patient verbalized understanding and agreement. Nurse Visit scheduled for BP check for 07/17/13.

## 2013-07-17 ENCOUNTER — Telehealth: Payer: Self-pay | Admitting: *Deleted

## 2013-07-17 NOTE — Telephone Encounter (Signed)
Pt was scheduled for BP check for starting minoxidil for hair loss.   She did not start this medication. She is concerned that the cholesterol medication is causing her hair loss. (She has been in study for "2-3 years" and also has been on crestor for longer than that) Her hair loss has been worst in last 1-2 years. She wonders if metoprolol or other meds may cause this as well. She also would like Dr. Harvie BridgeNahser's opinion on stopping the cholesterol study drug.

## 2013-07-20 ENCOUNTER — Ambulatory Visit (INDEPENDENT_AMBULATORY_CARE_PROVIDER_SITE_OTHER): Payer: Medicare Other | Admitting: Family Medicine

## 2013-07-20 ENCOUNTER — Encounter: Payer: Self-pay | Admitting: Family Medicine

## 2013-07-20 VITALS — BP 110/72 | HR 84 | Temp 98.2°F | Resp 18 | Ht 65.0 in | Wt 156.0 lb

## 2013-07-20 DIAGNOSIS — L659 Nonscarring hair loss, unspecified: Secondary | ICD-10-CM

## 2013-07-20 DIAGNOSIS — R319 Hematuria, unspecified: Secondary | ICD-10-CM

## 2013-07-20 LAB — URINALYSIS, ROUTINE W REFLEX MICROSCOPIC
Glucose, UA: NEGATIVE mg/dL
Nitrite: POSITIVE — AB
Protein, ur: 30 mg/dL — AB
Specific Gravity, Urine: >1.03 — ABNORMAL HIGH (ref 1.005–1.030)
Urobilinogen, UA: 0.2 mg/dL (ref 0.0–1.0)
pH: 5.5 (ref 5.0–8.0)

## 2013-07-20 LAB — URINALYSIS, MICROSCOPIC ONLY

## 2013-07-20 MED ORDER — CIPROFLOXACIN HCL 500 MG PO TABS: 500.0000 mg | ORAL_TABLET | Freq: Two times a day (BID) | ORAL | Status: DC

## 2013-07-20 NOTE — Progress Notes (Signed)
Subjective:    Patient ID: [name], female    DOB: May 23, 1929, 80 y.o.   MRN: [id number]  HPI Patient has seen urology over the last 6 months or urge incontinence as well as microscopic hematuria. She had a CT scan of the abdomen and pelvis in December which showed a left renal cyst and 2, 5 mm, nonobstructing left renal stones. She had a cystoscopy which was normal. Over the last 2 days she has developed gross hematuria and dysuria. It all began Saturday with bilateral low back pain. The pain radiated to her abdomen. She then developed gross hematuria. Last 24 hours the hematuria seems to be improving. She denies any fevers or chills. Urinalysis today is listed below: Office Visit on 07/20/2013  Component Date Value Ref Range Status  . Color, Urine 07/20/2013 YELLOW  YELLOW Final  . APPearance 07/20/2013 CLOUDY* CLEAR Final  . Specific Gravity, Urine 07/20/2013 >1.030* 1.005 - 1.030 Final  . pH 07/20/2013 5.5  5.0 - 8.0 Final  . Glucose, UA 07/20/2013 NEG  NEG mg/dL Final  . Bilirubin Urine 07/20/2013 SMALL* NEG Final  . Ketones, ur 07/20/2013 TRACE* NEG mg/dL Final  . Hgb urine dipstick 07/20/2013 LARGE* NEG Final  . Protein, ur 07/20/2013 30* NEG mg/dL Final  . Urobilinogen, UA 07/20/2013 0.2  0.0 - 1.0 mg/dL Final  . Nitrite 40/98/119102/23/2015 POS* NEG Final  . Leukocytes, UA 07/20/2013 SMALL* NEG Final  . Squamous Epithelial / LPF 07/20/2013 RARE  RARE Final  . Crystals 07/20/2013 Calcium Oxalate crystals noted  NONE SEEN Final  . Casts 07/20/2013 GRANULAR  NONE SEEN Final  . WBC, UA 07/20/2013 7-10* <3 WBC/hpf Final  . RBC / HPF 07/20/2013 11-20* <3 RBC/hpf Final  . Bacteria, UA 07/20/2013 MANY* RARE Final   HYALINE CAST PRESENT    The patient also complains of diffuse alopecia that has gradually been worsening over the last year. It is occurring in an androgenic pattern. Past Medical History  Diagnosis Date  . CAD (coronary artery disease)     s/p CABG x 5 in 1997 & CABG x 3 in  2010; Last cath in April of 2010 showed grafts to be patent. EF is 40 to 45%  . CHF (congestive heart failure)     EF is 40 to 45%  . Hyperlipemia   . Pleural effusion     post surgical  . PVD (peripheral vascular disease)     S/P right CEA  . GERD (gastroesophageal reflux disease)   . History of cerebrovascular disease   . Asthma   . Diverticulosis   . Depression     following bypass surgery  . Hypertension   . Hepatitis A   . Sleep apnea   . Stroke 2010  . Endometriosis   . Allergy   . LBP (low back pain)   . Hiatal hernia   . Mixed incontinence urge and stress 04/22/2013  . Nocturia 04/22/2013   Current Outpatient Prescriptions on File Prior to Visit  Medication Sig Dispense Refill  . aspirin EC 81 MG tablet Take 81 mg by mouth daily.      . Calcium Carbonate-Vit D-Min (CALCIUM 1200 PO) Take 1,200 mg by mouth daily.      . Cholecalciferol (VITAMIN D PO) Take 1,000 Units by mouth daily.       . cyclobenzaprine (FLEXERIL) 5 MG tablet Take 1 tablet (5 mg total) by mouth 3 (three) times daily as needed for muscle spasms.  21 tablet  0  . esomeprazole (NEXIUM) 40 MG capsule Take 1 capsule (40 mg total) by mouth daily before breakfast.  30 capsule  11  . HYDROcodone-acetaminophen (NORCO) 10-325 MG per tablet Take 1 tablet by mouth every 6 (six) hours as needed for moderate pain.      . isosorbide mononitrate (IMDUR) 60 MG 24 hr tablet Take 1 tablet (60 mg total) by mouth daily.  90 tablet  3  . metoprolol succinate (TOPROL-XL) 50 MG 24 hr tablet 1 tablet at 10am and 1 tablet at 5pm daily Confirmed with pt, husband, & pharmacy  60 tablet  3  . nitroGLYCERIN (NITROSTAT) 0.4 MG SL tablet Place 1 tablet (0.4 mg total) under the tongue every 5 (five) minutes as needed.  90 tablet  1  . PRESCRIPTION MEDICATION Take 1 tablet by mouth daily. Pt. On a study medication      . rosuvastatin (CRESTOR) 20 MG tablet Take 1 tablet (20 mg total) by mouth daily.  30 tablet  6   No current  facility-administered medications on file prior to visit.   Allergies  Allergen Reactions  . Prochlorperazine Edisylate Anaphylaxis  . Phenergan [Promethazine Hcl] Other (See Comments)    Facial numbness  . Sulfonamide Derivatives Nausea And Vomiting  . Zetia [Ezetimibe] Nausea Only    And abdominal pain  . Niacin Rash   History   Social History  . Marital Status: Married    Spouse Name: N/A    Number of Children: 0  . Years of Education: N/A   Occupational History  . retired Runner, broadcasting/film/video    Social History Main Topics  . Smoking status: Never Smoker   . Smokeless tobacco: Never Used  . Alcohol Use: No  . Drug Use: No  . Sexual Activity: No   Other Topics Concern  . Not on file   Social History Narrative  . No narrative on file      Review of Systems  All other systems reviewed and are negative.       Objective:   Physical Exam  Vitals reviewed. Constitutional: She appears well-developed and well-nourished.  Neck: Neck supple. No thyromegaly present.  Cardiovascular: Normal rate, regular rhythm, normal heart sounds and intact distal pulses.  Exam reveals no gallop.   No murmur heard. Pulmonary/Chest: Effort normal and breath sounds normal. No respiratory distress. She has no wheezes. She has no rales. She exhibits no tenderness.  Abdominal: Soft. Bowel sounds are normal.  Lymphadenopathy:    She has no cervical adenopathy.          Assessment & Plan:  1. Blood in urine Urinalysis suggests possibly a UTI. I will send a urine culture. Meanwhile we'll begin treatment with Cipro 500 mg by mouth twice a day for 7 days. Her symptoms she may have passed a kidney stone.  I rechecked the patient in 48 hours. Her symptoms persist I will proceed with a CT scan of the abdomen and pelvis to see if one of the kidney stones has passed. She is already had a thorough workup for hematuria. I do not feel it is necessary to repeat the workup at this time as it has been performed  in the last 6 months.  I did ask the patient to temporarily discontinue the aspirin for the next 48 hours. I anticipate hematuria which spontaneously resolved urinary tract infection other than the kidney stones passed - Urinalysis, Routine w reflex microscopic - ciprofloxacin (CIPRO) 500 MG tablet; Take 1 tablet (500 mg  total) by mouth 2 (two) times daily.  Dispense: 14 tablet; Refill: 0 - Urine culture  2. Alopecia I suspect androgen alopecia. Obtain a CBC CMP and TSH. Depending on the results of blood work I may recommend topical rogaine. - CBC with Differential - COMPLETE METABOLIC PANEL WITH GFR - TSH\

## 2013-07-20 NOTE — Telephone Encounter (Signed)
She should see Alfonse RasJeremy Smart, PharmD for lipid consult.  Thanks.

## 2013-07-21 NOTE — Telephone Encounter (Signed)
I spoke with Merrie RoofJeremy Pharm D, He said metoprolol/ beta blockers will cause alopecia in 1-2% of the population. He has had better results from calcium channel blockers and bystolic. Would you want to try a different med?

## 2013-07-21 NOTE — Telephone Encounter (Signed)
OK to DC metoprolol and start bystolic 10 mg a day. This may help her alopecia

## 2013-07-22 MED ORDER — NEBIVOLOL HCL 10 MG PO TABS: 10.0000 mg | ORAL_TABLET | Freq: Every day | ORAL | Status: DC

## 2013-07-22 NOTE — Telephone Encounter (Signed)
Pt informed script sent

## 2013-07-23 ENCOUNTER — Ambulatory Visit: Payer: Medicare Other | Admitting: Family Medicine

## 2013-07-24 ENCOUNTER — Other Ambulatory Visit: Payer: Self-pay

## 2013-07-24 LAB — URINE CULTURE: Colony Count: >=100000

## 2013-07-24 MED ORDER — METOPROLOL SUCCINATE ER 50 MG PO TB24: 50.0000 mg | ORAL_TABLET | Freq: Two times a day (BID) | ORAL | Status: DC

## 2013-07-28 ENCOUNTER — Other Ambulatory Visit: Payer: Self-pay

## 2013-07-28 ENCOUNTER — Ambulatory Visit (INDEPENDENT_AMBULATORY_CARE_PROVIDER_SITE_OTHER): Payer: Medicare Other | Admitting: Family Medicine

## 2013-07-28 ENCOUNTER — Encounter: Payer: Self-pay | Admitting: Family Medicine

## 2013-07-28 VITALS — BP 124/70 | HR 72 | Temp 97.0°F | Resp 18 | Ht 64.0 in | Wt 157.0 lb

## 2013-07-28 DIAGNOSIS — R319 Hematuria, unspecified: Secondary | ICD-10-CM

## 2013-07-28 LAB — URINALYSIS, ROUTINE W REFLEX MICROSCOPIC
Bilirubin Urine: NEGATIVE
Glucose, UA: NEGATIVE mg/dL
Ketones, ur: NEGATIVE mg/dL
Nitrite: NEGATIVE
Protein, ur: 30 mg/dL — AB
Specific Gravity, Urine: >1.03 — ABNORMAL HIGH (ref 1.005–1.030)
Urobilinogen, UA: 0.2 mg/dL (ref 0.0–1.0)
pH: 5.5 (ref 5.0–8.0)

## 2013-07-28 LAB — URINALYSIS, MICROSCOPIC ONLY
Casts: NONE SEEN
Crystals: NONE SEEN

## 2013-07-28 NOTE — Progress Notes (Signed)
Subjective:    Patient ID: [name], female    DOB: 05-08-1929, 80 y.o.   MRN: [id number]  HPI 07/20/13 Patient has seen urology over the last 6 months or urge incontinence as well as microscopic hematuria. She had a CT scan of the abdomen and pelvis in December which showed a left renal cyst and 2, 5 mm, nonobstructing left renal stones. She had a cystoscopy which was normal. Over the last 2 days she has developed gross hematuria and dysuria. It all began Saturday with bilateral low back pain. The pain radiated to her abdomen. She then developed gross hematuria. Last 24 hours the hematuria seems to be improving. She denies any fevers or chills. Urinalysis today is listed below: No visits with results within 1 Day(s) from this visit. Latest known visit with results is:  Office Visit on 07/20/2013  Component Date Value Ref Range Status  . Color, Urine 07/20/2013 YELLOW  YELLOW Final  . APPearance 07/20/2013 CLOUDY* CLEAR Final  . Specific Gravity, Urine 07/20/2013 >1.030* 1.005 - 1.030 Final  . pH 07/20/2013 5.5  5.0 - 8.0 Final  . Glucose, UA 07/20/2013 NEG  NEG mg/dL Final  . Bilirubin Urine 07/20/2013 SMALL* NEG Final  . Ketones, ur 07/20/2013 TRACE* NEG mg/dL Final  . Hgb urine dipstick 07/20/2013 LARGE* NEG Final  . Protein, ur 07/20/2013 30* NEG mg/dL Final  . Urobilinogen, UA 07/20/2013 0.2  0.0 - 1.0 mg/dL Final  . Nitrite 78/29/5621 POS* NEG Final  . Leukocytes, UA 07/20/2013 SMALL* NEG Final  . Squamous Epithelial / LPF 07/20/2013 RARE  RARE Final  . Crystals 07/20/2013 Calcium Oxalate crystals noted  NONE SEEN Final  . Casts 07/20/2013 GRANULAR  NONE SEEN Final  . WBC, UA 07/20/2013 7-10* <3 WBC/hpf Final  . RBC / HPF 07/20/2013 11-20* <3 RBC/hpf Final  . Bacteria, UA 07/20/2013 MANY* RARE Final   HYALINE CAST PRESENT   . Culture 07/20/2013 ESCHERICHIA COLI   Final  . Colony Count 07/20/2013 >=100,000 COLONIES/ML   Final  . Organism ID, Bacteria 07/20/2013 ESCHERICHIA  COLI   Final   The patient also complains of diffuse alopecia that has gradually been worsening over the last year. It is occurring in an androgenic pattern.  At that time, my plan was: 1. Blood in urine Urinalysis suggests possibly a UTI. I will send a urine culture. Meanwhile we'll begin treatment with Cipro 500 mg by mouth twice a day for 7 days. Her symptoms she may have passed a kidney stone.  I rechecked the patient in 48 hours. Her symptoms persist I will proceed with a CT scan of the abdomen and pelvis to see if one of the kidney stones has passed. She is already had a thorough workup for hematuria. I do not feel it is necessary to repeat the workup at this time as it has been performed in the last 6 months.  I did ask the patient to temporarily discontinue the aspirin for the next 48 hours. I anticipate hematuria which spontaneously resolved urinary tract infection other than the kidney stones passed - Urinalysis, Routine w reflex microscopic - ciprofloxacin (CIPRO) 500 MG tablet; Take 1 tablet (500 mg total) by mouth 2 (two) times daily.  Dispense: 14 tablet; Refill: 0 - Urine culture  2. Alopecia I suspect androgen alopecia. Obtain a CBC CMP and TSH. Depending on the results of blood work I may recommend topical rogaine. - CBC with Differential - COMPLETE METABOLIC PANEL WITH GFR - TSH  Patient is here today for recheck. Urine culture revealed Escherichia coli. The patient was treated with antibiotics. Her hematuria has stopped. She denies any hematuria in one week. Past Medical History  Diagnosis Date  . CAD (coronary artery disease)     s/p CABG x 5 in 1997 & CABG x 3 in 2010; Last cath in April of 2010 showed grafts to be patent. EF is 40 to 45%  . CHF (congestive heart failure)     EF is 40 to 45%  . Hyperlipemia   . Pleural effusion     post surgical  . PVD (peripheral vascular disease)     S/P right CEA  . GERD (gastroesophageal reflux disease)   . History of  cerebrovascular disease   . Asthma   . Diverticulosis   . Depression     following bypass surgery  . Hypertension   . Hepatitis A   . Sleep apnea   . Stroke 2010  . Endometriosis   . Allergy   . LBP (low back pain)   . Hiatal hernia   . Mixed incontinence urge and stress 04/22/2013  . Nocturia 04/22/2013   Current Outpatient Prescriptions on File Prior to Visit  Medication Sig Dispense Refill  . Calcium Carbonate-Vit D-Min (CALCIUM 1200 PO) Take 1,200 mg by mouth daily.      . Cholecalciferol (VITAMIN D PO) Take 1,000 Units by mouth daily.       . cyclobenzaprine (FLEXERIL) 5 MG tablet Take 1 tablet (5 mg total) by mouth 3 (three) times daily as needed for muscle spasms.  21 tablet  0  . esomeprazole (NEXIUM) 40 MG capsule Take 1 capsule (40 mg total) by mouth daily before breakfast.  30 capsule  11  . HYDROcodone-acetaminophen (NORCO) 10-325 MG per tablet Take 1 tablet by mouth every 6 (six) hours as needed for moderate pain.      . isosorbide mononitrate (IMDUR) 60 MG 24 hr tablet Take 1 tablet (60 mg total) by mouth daily.  90 tablet  3  . nebivolol (BYSTOLIC) 10 MG tablet Take 1 tablet (10 mg total) by mouth daily.  30 tablet  11  . nitroGLYCERIN (NITROSTAT) 0.4 MG SL tablet Place 1 tablet (0.4 mg total) under the tongue every 5 (five) minutes as needed.  90 tablet  1  . PRESCRIPTION MEDICATION Take 1 tablet by mouth daily. Pt. On a study medication      . rosuvastatin (CRESTOR) 20 MG tablet Take 1 tablet (20 mg total) by mouth daily.  30 tablet  6   No current facility-administered medications on file prior to visit.   Allergies  Allergen Reactions  . Prochlorperazine Edisylate Anaphylaxis  . Metoprolol Other (See Comments)    alopecia  . Phenergan [Promethazine Hcl] Other (See Comments)    Facial numbness  . Sulfonamide Derivatives Nausea And Vomiting  . Zetia [Ezetimibe] Nausea Only    And abdominal pain  . Niacin Rash   History   Social History  . Marital Status:  Married    Spouse Name: N/A    Number of Children: 0  . Years of Education: N/A   Occupational History  . retired Runner, broadcasting/film/video    Social History Main Topics  . Smoking status: Never Smoker   . Smokeless tobacco: Never Used  . Alcohol Use: No  . Drug Use: No  . Sexual Activity: No   Other Topics Concern  . Not on file   Social History Narrative  . No narrative  on file      Review of Systems  All other systems reviewed and are negative.       Objective:   Physical Exam  Vitals reviewed. Constitutional: She appears well-developed and well-nourished.  Neck: Neck supple. No thyromegaly present.  Cardiovascular: Normal rate, regular rhythm, normal heart sounds and intact distal pulses.  Exam reveals no gallop.   No murmur heard. Pulmonary/Chest: Effort normal and breath sounds normal. No respiratory distress. She has no wheezes. She has no rales. She exhibits no tenderness.  Abdominal: Soft. Bowel sounds are normal.  Lymphadenopathy:    She has no cervical adenopathy.          Assessment & Plan:  Hematuria - Plan: Urine culture, Urinalysis, Routine w reflex microscopic  Check a urinalysis and urine culture. We'll confirm that the urine culture shows resolution of her urinary tract infection.  Her hematuria has subsided. I believe it was likely either a kidney stone or the result of a urinary tract infection. Regardless her symptoms have now stopped.  The patient can resume her aspirin.

## 2013-07-29 ENCOUNTER — Other Ambulatory Visit: Payer: Self-pay

## 2013-07-30 LAB — URINE CULTURE: Colony Count: 4000

## 2013-07-30 NOTE — Telephone Encounter (Signed)
refill 

## 2013-08-05 ENCOUNTER — Ambulatory Visit: Payer: Medicare Other | Admitting: Family Medicine

## 2013-08-05 VITALS — BP 130/70

## 2013-08-05 DIAGNOSIS — I10 Essential (primary) hypertension: Secondary | ICD-10-CM

## 2013-08-07 ENCOUNTER — Encounter: Payer: Self-pay | Admitting: Cardiology

## 2013-08-31 ENCOUNTER — Ambulatory Visit (INDEPENDENT_AMBULATORY_CARE_PROVIDER_SITE_OTHER): Payer: Medicare Other | Admitting: Cardiovascular Disease

## 2013-08-31 ENCOUNTER — Encounter: Payer: Self-pay | Admitting: Cardiovascular Disease

## 2013-08-31 VITALS — BP 124/70 | HR 75 | Ht 64.0 in | Wt 157.8 lb

## 2013-08-31 DIAGNOSIS — I2510 Atherosclerotic heart disease of native coronary artery without angina pectoris: Secondary | ICD-10-CM

## 2013-08-31 DIAGNOSIS — I10 Essential (primary) hypertension: Secondary | ICD-10-CM

## 2013-08-31 NOTE — Progress Notes (Signed)
[name] Date of Birth  [birth year]       [facility]    [facility] [facility] [address], [address]  [address] [place], [place]  [address]   [place], [place]  [address] [phone]     [phone]   Fax  [phone]    Fax [phone]  Problem List: 1. Coronary artery disease: She status post CABG in 1997 and again in 2010. Heart catheterization in April 2010 and revealed severe native coronary artery disease. The LAD is occluded after giving off 2 large septal branches. The left circumflex artery has moderate irregularities and has competitive flow from the saphenous vein graft to the acute marginal artery. The right coronary artery is occluded. The SVG to the first obtuse marginal has minor luminal irregularities. The free RIMA originates off the hood of the SVG. It is nice graft and anastomosis to the distal LAD is normal. The SVG to right coronary artery is smooth and normal. The posterior descending artery and the posterior lateral segment artery are normal. Her ejection fraction is 40-45%. 2. Congestive heart failure-EF of 40-45% 3. Hyperlipidemia 4. Peripheral vascular disease status post right carotid endarterectomy  History of Present Illness:  [name] is an 80 y.o. female with the above noted history.   She's not had any episodes of chest pain. She does note lots of fatigue and as a result she has not been exercising much on a treadmill. She gets out of breath community slight activity.  She's not had any syncope or presyncope.  June 09, 2012 She is doing well.  She is just getting over a bad cold.  She is not exercising much this winter.  She was walking about 45 minutes a day prior to the winter months.   She has had some muscular shoulder pain but no angina.   Sept. 14, 2015:  [name] is doing well.  No problems.  No angina.   She is enrolled in [employer] but doesn't go often.  She sits quite a bit during the day. She has some dyspena -  especially with exertion.   Nov. 10, 2014  She is doing well.  She walking and exercising .  No significant CP.  She had an abnormal myoview and subsequent cath revealed an occluded SVG to OM  Left mainstem: There is eccentric 40-50% narrowing of the left main. The left main is relatively short.  Left anterior descending (LAD): The LAD is diffusely diseased in the proximal vessel and is occluded after the second septal perforator.  Left circumflex (LCx): The left circumflex is a large vessel that continues in the AV groove into a posterior lateral branch. The first obtuse marginal vessel is occluded. There are left to left collaterals to the OM.  Right coronary artery (RCA): The right coronary is occluded proximally.  The free RIMA graft to the LAD is widely patent with good runoff. There is mild angulation of this graft at the anastomosis with the vein graft hood.  The saphenous vein graft to the first OM is occluded proximally.  The saphenous vein graft to the right coronary is widely patent with good runoff.  Left ventriculography: Left ventricular systolic function is normal, LVEF is estimated at 55-65%, there is no significant mitral regurgitation  Final Conclusions:  1. 3 vessel occlusive coronary disease.  2. Patent free RIMA graft to the LAD  3. Patent SVG to the RCA.  4. Occluded saphenous vein graft to the OM1.  5. Normal LV function.  Recommendations: Recommend medical management.  She seems to be doing well.  August 31, 2013:  [name] is about the same.   She remains short of breath.  Not exercising.  No angina    Current Outpatient Prescriptions on File Prior to Visit  Medication Sig Dispense Refill  . aspirin 325 MG tablet Take 325 mg by mouth daily.      . Calcium Carbonate-Vit D-Min (CALCIUM 1200 PO) Take 1,200 mg by mouth daily.      . Cholecalciferol (VITAMIN D PO) Take 1,000 Units by mouth daily.       . cyclobenzaprine (FLEXERIL) 5 MG tablet Take 1 tablet (5 mg total)  by mouth 3 (three) times daily as needed for muscle spasms.  21 tablet  0  . esomeprazole (NEXIUM) 40 MG capsule Take 1 capsule (40 mg total) by mouth daily before breakfast.  30 capsule  11  . HYDROcodone-acetaminophen (NORCO) 10-325 MG per tablet Take 1 tablet by mouth every 6 (six) hours as needed for moderate pain.      . isosorbide mononitrate (IMDUR) 60 MG 24 hr tablet Take 1 tablet (60 mg total) by mouth daily.  90 tablet  3  . nebivolol (BYSTOLIC) 10 MG tablet Take 1 tablet (10 mg total) by mouth daily.  30 tablet  11  . nitroGLYCERIN (NITROSTAT) 0.4 MG SL tablet Place 1 tablet (0.4 mg total) under the tongue every 5 (five) minutes as needed.  90 tablet  1  . PRESCRIPTION MEDICATION Take 1 tablet by mouth daily. Pt. On a study medication      . rosuvastatin (CRESTOR) 20 MG tablet Take 1 tablet (20 mg total) by mouth daily.  30 tablet  6   No current facility-administered medications on file prior to visit.    Allergies  Allergen Reactions  . Prochlorperazine Edisylate Anaphylaxis  . Metoprolol Other (See Comments)    alopecia  . Phenergan [Promethazine Hcl] Other (See Comments)    Facial numbness  . Sulfonamide Derivatives Nausea And Vomiting  . Zetia [Ezetimibe] Nausea Only    And abdominal pain  . Niacin Rash    Past Medical History  Diagnosis Date  . CAD (coronary artery disease)     s/p CABG x 5 in 1997 & CABG x 3 in 2010; Last cath in April of 2010 showed grafts to be patent. EF is 40 to 45%  . CHF (congestive heart failure)     EF is 40 to 45%  . Hyperlipemia   . Pleural effusion     post surgical  . PVD (peripheral vascular disease)     S/P right CEA  . GERD (gastroesophageal reflux disease)   . History of cerebrovascular disease   . Asthma   . Diverticulosis   . Depression     following bypass surgery  . Hypertension   . Hepatitis A   . Sleep apnea   . Stroke 2010  . Endometriosis   . Allergy   . LBP (low back pain)   . Hiatal hernia   . Mixed  incontinence urge and stress 04/22/2013  . Nocturia 04/22/2013    Past Surgical History  Procedure Laterality Date  . Coronary artery bypass graft  1610,9604    Original surgery in 1997 x 5; Redo CABG x 3 in 2010 including free RIMA to distal LAD, SVG to OM and SVG to left posterolateral  . Cardiac catheterization  April 2010    Grafts were patent. EF  is 40 to 45%  . Partial colectomy      due to colon abcess  . Knee surgery      left  . Carotid endarterectomy      right  . Appendectomy      History  Smoking status  . Never Smoker   Smokeless tobacco  . Never Used    History  Alcohol Use No    Family History  Problem Relation Age of Onset  . Heart attack Mother   . Heart attack Father   . Diabetes Mother   . Diabetes Sister   . Diabetes Maternal Grandmother   . Diabetes Paternal Grandmother   . Colon cancer Neg Hx   . Stroke Sister     Reviw of Systems:  Reviewed in the HPI.  All other systems are negative.  Physical Exam: Blood pressure 124/70, pulse 75, height 5\' 4"  (1.626 m), weight 157 lb 12.8 oz (71.578 kg). General: Well developed, well nourished, in no acute distress. Head: Normocephalic, atraumatic, sclera non-icteric, mucus membranes are moist,  Neck: Supple. Carotids are 2 + without bruits. No JVD.  She has a right carotid endarterectomy scar. Lungs: Clear bilaterally to auscultation. Heart: regular rate.  normal  S1 S2. No murmurs, gallops or rubs. Abdomen: Soft, non-tender, non-distended with normal bowel sounds. No hepatomegaly. No rebound/guarding. No masses. Msk:  Strength and tone are normal Extremities: No clubbing or cyanosis. No edema.  Distal pedal pulses are 2+ and equal bilaterally. Neuro: Alert and oriented X 3. Moves all extremities spontaneously. Psych:  Responds to questions appropriately with a normal affect.  ECG: 08/31/2013: Normal sinus rhythm at 75 beats a minute. She is possible left atrial enlargement. There is an old septal  myocardial infarction. Assessment / Plan:

## 2013-08-31 NOTE — Assessment & Plan Note (Signed)
Blood pressure is well controlled 

## 2013-08-31 NOTE — Assessment & Plan Note (Signed)
[name] is doing OK.  She remains about the same.  Still has some dyspnea - no angina. She had a cardiac elevation of [place].  We will continue with her same medications. I'll see her again in 6 months for followup office visit.

## 2013-08-31 NOTE — Patient Instructions (Signed)
Your physician wants you to follow-up in: 6 months  You will receive a reminder letter in the mail two months in advance. If you don't receive a letter, please call our office to schedule the follow-up appointment.  Your physician recommends that you continue on your current medications as directed. Please refer to the Current Medication list given to you today.  

## 2013-10-09 ENCOUNTER — Other Ambulatory Visit: Payer: Medicare Other

## 2013-10-09 ENCOUNTER — Ambulatory Visit: Payer: Medicare Other | Admitting: Cardiovascular Disease

## 2013-10-13 ENCOUNTER — Telehealth: Payer: Self-pay | Admitting: *Deleted

## 2013-10-13 ENCOUNTER — Ambulatory Visit (INDEPENDENT_AMBULATORY_CARE_PROVIDER_SITE_OTHER): Payer: Medicare Other | Admitting: *Deleted

## 2013-10-13 ENCOUNTER — Encounter: Payer: Self-pay | Admitting: Interventional Cardiology

## 2013-10-13 ENCOUNTER — Ambulatory Visit (INDEPENDENT_AMBULATORY_CARE_PROVIDER_SITE_OTHER): Payer: Medicare Other | Admitting: Interventional Cardiology

## 2013-10-13 VITALS — BP 142/78 | HR 68

## 2013-10-13 DIAGNOSIS — I10 Essential (primary) hypertension: Secondary | ICD-10-CM

## 2013-10-13 DIAGNOSIS — I2510 Atherosclerotic heart disease of native coronary artery without angina pectoris: Secondary | ICD-10-CM

## 2013-10-13 DIAGNOSIS — I679 Cerebrovascular disease, unspecified: Secondary | ICD-10-CM | POA: Insufficient documentation

## 2013-10-13 DIAGNOSIS — R42 Dizziness and giddiness: Secondary | ICD-10-CM

## 2013-10-13 LAB — CBC WITH DIFFERENTIAL/PLATELET
Basophils Absolute: 0 10*3/uL (ref 0.0–0.1)
Basophils Relative: 0.3 % (ref 0.0–3.0)
Eosinophils Absolute: 0.1 10*3/uL (ref 0.0–0.7)
Eosinophils Relative: 0.6 % (ref 0.0–5.0)
HCT: 39.5 % (ref 36.0–46.0)
Hemoglobin: 13.2 g/dL (ref 12.0–15.0)
Lymphocytes Relative: 23.5 % (ref 12.0–46.0)
Lymphs Abs: 2.5 10*3/uL (ref 0.7–4.0)
MCHC: 33.5 g/dL (ref 30.0–36.0)
MCV: 91.1 fl (ref 78.0–100.0)
Monocytes Absolute: 0.7 10*3/uL (ref 0.1–1.0)
Monocytes Relative: 6.2 % (ref 3.0–12.0)
Neutro Abs: 7.4 10*3/uL (ref 1.4–7.7)
Neutrophils Relative %: 69.4 % (ref 43.0–77.0)
Platelets: 225 10*3/uL (ref 150.0–400.0)
RBC: 4.34 Mil/uL (ref 3.87–5.11)
RDW: 13.6 % (ref 11.5–15.5)
WBC: 10.6 10*3/uL — ABNORMAL HIGH (ref 4.0–10.5)

## 2013-10-13 LAB — TSH: TSH: 2.13 u[IU]/mL (ref 0.35–4.50)

## 2013-10-13 LAB — COMPREHENSIVE METABOLIC PANEL
ALT: 12 U/L (ref 0–35)
AST: 19 U/L (ref 0–37)
Albumin: 3.9 g/dL (ref 3.5–5.2)
Alkaline Phosphatase: 50 U/L (ref 39–117)
BUN: 13 mg/dL (ref 6–23)
CO2: 28 mEq/L (ref 19–32)
Calcium: 9.4 mg/dL (ref 8.4–10.5)
Chloride: 105 mEq/L (ref 96–112)
Creatinine, Ser: 0.9 mg/dL (ref 0.4–1.2)
GFR: 61.23 mL/min (ref >60.00)
Glucose, Bld: 101 mg/dL — ABNORMAL HIGH (ref 70–99)
Potassium: 4 mEq/L (ref 3.5–5.1)
Sodium: 140 mEq/L (ref 135–145)
Total Bilirubin: 0.8 mg/dL (ref 0.2–1.2)
Total Protein: 6.9 g/dL (ref 6.0–8.3)

## 2013-10-13 NOTE — Progress Notes (Signed)
Patient ID: [name] Mcmanamon, female   DOB: 1928/12/05, 80 y.o.   MRN: [id number]    [address] [place]  [address] Phone: [phone] Fax:  [phone]  Date:  10/13/2013   ID:  [name] Leete, DOB 1928/12/05, MRN [id number]  PCP:  Leo GrosserPICKARD,WARREN TOM, MD   ASSESSMENT:  1. Dizziness and headache, transient, and resolved. Dizziness have the quality of vertigo 2. Cerebrovascular disease, not recently evaluated  3. Coronary artery disease, stable 4. Hypertension, controlled  PLAN:  1.Biilateral carotid Doppler to rule out cerebral vascular disease/vertebral insufficiency 2. No change in medical regimen 3. Notify us if recurrent symptoms   SUBJECTIVE: [name] Apo is a 80 y.o. female who fasted overnight to have blood work drawn this morning. After having the blood were drawn she went to her church and her husband tetralogy lunch at [employer]. While there she began having headache on the top of her cranium and dizziness that she characterized as being a swaying type of unsteadiness. Her husband brought her back to the cardiology office. She denies chest pain. As a prior history of migraine headaches but none in the past 10-15 years. The headache was not of migrainous quality. The headache is gone by the time of this evaluation. She actually feels better and the dizziness has resolved as well. There were no palpitations or other complaints. She denies syncope and near-syncope. There is a history of right carotid endarterectomy. No recent carotid evaluation is noted in her records.   Wt Readings from Last 3 Encounters:  08/31/13 157 lb 12.8 oz (71.578 kg)  07/28/13 157 lb (71.215 kg)  07/20/13 156 lb (70.761 kg)     Past Medical History  Diagnosis Date  . CAD (coronary artery disease)     s/p CABG x 5 in 1997 & CABG x 3 in 2010; Last cath in April of 2010 showed grafts to be patent. EF is 40 to 45%  . CHF (congestive heart failure)     EF is 40 to 45%  . Hyperlipemia    . Pleural effusion     post surgical  . PVD (peripheral vascular disease)     S/P right CEA  . GERD (gastroesophageal reflux disease)   . History of cerebrovascular disease   . Asthma   . Diverticulosis   . Depression     following bypass surgery  . Hypertension   . Hepatitis A   . Sleep apnea   . Stroke 2010  . Endometriosis   . Allergy   . LBP (low back pain)   . Hiatal hernia   . Mixed incontinence urge and stress 04/22/2013  . Nocturia 04/22/2013    Current Outpatient Prescriptions  Medication Sig Dispense Refill  . aspirin 325 MG tablet Take 325 mg by mouth daily.      . Calcium Carbonate-Vit D-Min (CALCIUM 1200 PO) Take 1,200 mg by mouth daily.      . Cholecalciferol (VITAMIN D PO) Take 1,000 Units by mouth daily.       . cyclobenzaprine (FLEXERIL) 5 MG tablet Take 1 tablet (5 mg total) by mouth 3 (three) times daily as needed for muscle spasms.  21 tablet  0  . esomeprazole (NEXIUM) 40 MG capsule Take 1 capsule (40 mg total) by mouth daily before breakfast.  30 capsule  11  . HYDROcodone-acetaminophen (NORCO) 10-325 MG per tablet Take 1 tablet by mouth every 6 (six) hours as needed for moderate pain.      .Marland Kitchen  isosorbide mononitrate (IMDUR) 60 MG 24 hr tablet Take 1 tablet (60 mg total) by mouth daily.  90 tablet  3  . nebivolol (BYSTOLIC) 10 MG tablet Take 1 tablet (10 mg total) by mouth daily.  30 tablet  11  . nitroGLYCERIN (NITROSTAT) 0.4 MG SL tablet Place 1 tablet (0.4 mg total) under the tongue every 5 (five) minutes as needed.  90 tablet  1  . PRESCRIPTION MEDICATION Take 1 tablet by mouth daily. Pt. On a study medication      . rosuvastatin (CRESTOR) 20 MG tablet Take 1 tablet (20 mg total) by mouth daily.  30 tablet  6   No current facility-administered medications for this visit.    Allergies:    Allergies  Allergen Reactions  . Prochlorperazine Edisylate Anaphylaxis  . Metoprolol Other (See Comments)    alopecia  . Phenergan [Promethazine Hcl] Other (See  Comments)    Facial numbness  . Sulfonamide Derivatives Nausea And Vomiting  . Zetia [Ezetimibe] Nausea Only    And abdominal pain  . Niacin Rash    Social History:  The patient  reports that she has never smoked. She has never used smokeless tobacco. She reports that she does not drink alcohol or use illicit drugs.   ROS:  Please see the history of present illness.   She denies transient neurological symptoms such as amaurosis fugax, weakness, paresthesias, double vision, and slurred speech.   All other systems reviewed and negative.   OBJECTIVE: VS:  BP 142/78  Pulse 68 Well nourished, well developed, in no acute distress, elderly HEENT: normal Neck: JVD flat. Carotid bruit evidence of right carotid endarterectomy  Cardiac:  normal S1, S2; RRR; no murmur Lungs:  clear to auscultation bilaterally, no wheezing, rhonchi or rales Abd: soft, nontender, no hepatomegaly Ext: Edema absent. Pulses 2+ and symmetric Skin: warm and dry Neuro:  CNs 2-12 intact, no focal abnormalities noted  EKG:  Not performed       Signed, Darci NeedleHenry W. [name]. Xoey Warmoth III, MD 10/13/2013 1:11 PM

## 2013-10-13 NOTE — Patient Instructions (Signed)
Your physician recommends that you continue on your current medications as directed. Please refer to the Current Medication list given to you today. Your physician has requested that you have a carotid duplex. This test is an ultrasound of the carotid arteries in your neck. It looks at blood flow through these arteries that supply the brain with blood. Allow one hour for this exam. There are no restrictions or special instructions.   

## 2013-10-13 NOTE — Telephone Encounter (Signed)
Patient in for lab work. Became dizzy with pronounced fatigue. Added to Dr. Michaelle CopasSmith's schedule. VSS, HR regular and strong. States she gets fatigued and SOB with exertion, "even brushing my teeth". Gait is very slow and she needs assistance for balance. At this time, she is being seen by Dr. Katrinka BlazingSmith.

## 2013-10-16 ENCOUNTER — Telehealth: Payer: Self-pay | Admitting: Cardiovascular Disease

## 2013-10-16 NOTE — Telephone Encounter (Signed)
LMTCB about returning pts call.

## 2013-10-16 NOTE — Telephone Encounter (Signed)
New message  ° ° °Returning call back to nurse.  °

## 2013-10-16 NOTE — Telephone Encounter (Signed)
Pt contacted about lab results that were reviewed by Dr Elease Hashimoto.  Per Dr Elease Hashimoto this pts labs were ok. Pt verbalized understanding and pleased with this news.

## 2013-10-19 ENCOUNTER — Encounter: Payer: Self-pay | Admitting: Internal Medicine

## 2013-10-20 ENCOUNTER — Ambulatory Visit (HOSPITAL_COMMUNITY): Payer: Medicare Other | Attending: Cardiology | Admitting: Cardiology

## 2013-10-20 ENCOUNTER — Emergency Department (HOSPITAL_COMMUNITY)
Admission: EM | Admit: 2013-10-20 | Discharge: 2013-10-20 | Disposition: A | Discharge status: Home / Self Care | Payer: Medicare Other | Attending: Emergency Medicine | Admitting: Emergency Medicine

## 2013-10-20 ENCOUNTER — Telehealth: Payer: Self-pay | Admitting: *Deleted

## 2013-10-20 ENCOUNTER — Encounter (HOSPITAL_COMMUNITY): Payer: Self-pay | Admitting: Cardiology

## 2013-10-20 ENCOUNTER — Emergency Department (HOSPITAL_COMMUNITY): Payer: Medicare Other

## 2013-10-20 ENCOUNTER — Encounter (HOSPITAL_COMMUNITY): Payer: Self-pay | Admitting: Emergency Medicine

## 2013-10-20 DIAGNOSIS — Z7982 Long term (current) use of aspirin: Secondary | ICD-10-CM | POA: Insufficient documentation

## 2013-10-20 DIAGNOSIS — F329 Major depressive disorder, single episode, unspecified: Secondary | ICD-10-CM | POA: Insufficient documentation

## 2013-10-20 DIAGNOSIS — R0600 Dyspnea, unspecified: Secondary | ICD-10-CM

## 2013-10-20 DIAGNOSIS — J45909 Unspecified asthma, uncomplicated: Secondary | ICD-10-CM | POA: Insufficient documentation

## 2013-10-20 DIAGNOSIS — R51 Headache: Secondary | ICD-10-CM | POA: Insufficient documentation

## 2013-10-20 DIAGNOSIS — Z951 Presence of aortocoronary bypass graft: Secondary | ICD-10-CM | POA: Insufficient documentation

## 2013-10-20 DIAGNOSIS — I679 Cerebrovascular disease, unspecified: Secondary | ICD-10-CM

## 2013-10-20 DIAGNOSIS — Z8742 Personal history of other diseases of the female genital tract: Secondary | ICD-10-CM | POA: Insufficient documentation

## 2013-10-20 DIAGNOSIS — Z9089 Acquired absence of other organs: Secondary | ICD-10-CM | POA: Insufficient documentation

## 2013-10-20 DIAGNOSIS — F3289 Other specified depressive episodes: Secondary | ICD-10-CM | POA: Insufficient documentation

## 2013-10-20 DIAGNOSIS — I10 Essential (primary) hypertension: Secondary | ICD-10-CM | POA: Insufficient documentation

## 2013-10-20 DIAGNOSIS — R42 Dizziness and giddiness: Secondary | ICD-10-CM | POA: Insufficient documentation

## 2013-10-20 DIAGNOSIS — K219 Gastro-esophageal reflux disease without esophagitis: Secondary | ICD-10-CM | POA: Insufficient documentation

## 2013-10-20 DIAGNOSIS — I509 Heart failure, unspecified: Secondary | ICD-10-CM | POA: Insufficient documentation

## 2013-10-20 DIAGNOSIS — N23 Unspecified renal colic: Secondary | ICD-10-CM | POA: Insufficient documentation

## 2013-10-20 DIAGNOSIS — E785 Hyperlipidemia, unspecified: Secondary | ICD-10-CM | POA: Insufficient documentation

## 2013-10-20 DIAGNOSIS — Z9889 Other specified postprocedural states: Secondary | ICD-10-CM | POA: Insufficient documentation

## 2013-10-20 DIAGNOSIS — Z79899 Other long term (current) drug therapy: Secondary | ICD-10-CM | POA: Insufficient documentation

## 2013-10-20 DIAGNOSIS — I2510 Atherosclerotic heart disease of native coronary artery without angina pectoris: Secondary | ICD-10-CM | POA: Insufficient documentation

## 2013-10-20 DIAGNOSIS — Z8673 Personal history of transient ischemic attack (TIA), and cerebral infarction without residual deficits: Secondary | ICD-10-CM | POA: Insufficient documentation

## 2013-10-20 DIAGNOSIS — Z8619 Personal history of other infectious and parasitic diseases: Secondary | ICD-10-CM | POA: Insufficient documentation

## 2013-10-20 DIAGNOSIS — N390 Urinary tract infection, site not specified: Secondary | ICD-10-CM | POA: Insufficient documentation

## 2013-10-20 LAB — URINE MICROSCOPIC-ADD ON

## 2013-10-20 LAB — URINALYSIS, ROUTINE W REFLEX MICROSCOPIC
Bilirubin Urine: NEGATIVE
Glucose, UA: NEGATIVE mg/dL
Ketones, ur: NEGATIVE mg/dL
Nitrite: POSITIVE — AB
Protein, ur: 30 mg/dL — AB
Specific Gravity, Urine: 1.016 (ref 1.005–1.030)
Urobilinogen, UA: 1 mg/dL (ref 0.0–1.0)
pH: 6 (ref 5.0–8.0)

## 2013-10-20 MED ORDER — ONDANSETRON HCL 4 MG/2ML IJ SOLN
4.0000 mg | Freq: Once | INTRAMUSCULAR | Status: AC
  Administered 2013-10-20: 4 mg via INTRAVENOUS
  Filled 2013-10-20: qty 2

## 2013-10-20 MED ORDER — ONDANSETRON 8 MG PO TBDP: 8.0000 mg | ORAL_TABLET | Freq: Three times a day (TID) | ORAL | Status: DC | PRN

## 2013-10-20 MED ORDER — DEXTROSE 5 % IV SOLN
1.0000 g | Freq: Once | INTRAVENOUS | Status: AC
  Administered 2013-10-20: 1 g via INTRAVENOUS
  Filled 2013-10-20: qty 10

## 2013-10-20 MED ORDER — TAMSULOSIN HCL 0.4 MG PO CAPS: 0.4000 mg | ORAL_CAPSULE | Freq: Every day | ORAL | Status: DC

## 2013-10-20 MED ORDER — MORPHINE SULFATE 4 MG/ML IJ SOLN
4.0000 mg | Freq: Once | INTRAMUSCULAR | Status: AC
  Administered 2013-10-20: 4 mg via INTRAVENOUS
  Filled 2013-10-20: qty 1

## 2013-10-20 MED ORDER — OXYCODONE-ACETAMINOPHEN 5-325 MG PO TABS: 1.0000 | ORAL_TABLET | ORAL | Status: DC | PRN

## 2013-10-20 MED ORDER — FUROSEMIDE 20 MG PO TABS
20.0000 mg | ORAL_TABLET | Freq: Every day | ORAL | Status: DC
Start: 2013-10-20 — End: 2013-12-15

## 2013-10-20 NOTE — ED Notes (Signed)
Pt reports when she went to the restroom that she passed a clot.  Pt given a beverage to drink and encouraged to drink for UA sample.  Pt aware of UA.

## 2013-10-20 NOTE — ED Notes (Signed)
Pt transported to CT ?

## 2013-10-20 NOTE — Discharge Instructions (Signed)
Follow up with your urologist tomorrow Kidney Stones Kidney stones (urolithiasis) are deposits that form inside your kidneys. The intense pain is caused by the stone moving through the urinary tract. When the stone moves, the ureter goes into spasm around the stone. The stone is usually passed in the urine.  CAUSES   A disorder that makes certain neck glands produce too much parathyroid hormone (primary hyperparathyroidism).  A buildup of uric acid crystals, similar to gout in your joints.  Narrowing (stricture) of the ureter.  A kidney obstruction present at birth (congenital obstruction).  Previous surgery on the kidney or ureters.  Numerous kidney infections. SYMPTOMS   Feeling sick to your stomach (nauseous).  Throwing up (vomiting).  Blood in the urine (hematuria).  Pain that usually spreads (radiates) to the groin.  Frequency or urgency of urination. DIAGNOSIS   Taking a history and physical exam.  Blood or urine tests.  CT scan.  Occasionally, an examination of the inside of the urinary bladder (cystoscopy) is performed. TREATMENT   Observation.  Increasing your fluid intake.  Extracorporeal shock wave lithotripsy This is a noninvasive procedure that uses shock waves to break up kidney stones.  Surgery may be needed if you have severe pain or persistent obstruction. There are various surgical procedures. Most of the procedures are performed with the use of small instruments. Only small incisions are needed to accommodate these instruments, so recovery time is minimized. The size, location, and chemical composition are all important variables that will determine the proper choice of action for you. Talk to your health care provider to better understand your situation so that you will minimize the risk of injury to yourself and your kidney.  HOME CARE INSTRUCTIONS   Drink enough water and fluids to keep your urine clear or pale yellow. This will help you to pass  the stone or stone fragments.  Strain all urine through the provided strainer. Keep all particulate matter and stones for your health care provider to see. The stone causing the pain may be as small as a grain of salt. It is very important to use the strainer each and every time you pass your urine. The collection of your stone will allow your health care provider to analyze it and verify that a stone has actually passed. The stone analysis will often identify what you can do to reduce the incidence of recurrences.  Only take over-the-counter or prescription medicines for pain, discomfort, or fever as directed by your health care provider.  Make a follow-up appointment with your health care provider as directed.  Get follow-up X-rays if required. The absence of pain does not always mean that the stone has passed. It may have only stopped moving. If the urine remains completely obstructed, it can cause loss of kidney function or even complete destruction of the kidney. It is your responsibility to make sure X-rays and follow-ups are completed. Ultrasounds of the kidney can show blockages and the status of the kidney. Ultrasounds are not associated with any radiation and can be performed easily in a matter of minutes. SEEK MEDICAL CARE IF:  You experience pain that is progressive and unresponsive to any pain medicine you have been prescribed. SEEK IMMEDIATE MEDICAL CARE IF:   Pain cannot be controlled with the prescribed medicine.  You have a fever or shaking chills.  The severity or intensity of pain increases over 18 hours and is not relieved by pain medicine.  You develop a new onset of abdominal pain.  You feel faint or pass out.  You are unable to urinate. MAKE SURE YOU:   Understand these instructions.  Will watch your condition.  Will get help right away if you are not doing well or get worse. Document Released: 05/14/2005 Document Revised: 01/14/2013 Document Reviewed:  10/15/2012 [facility] Patient Information 2014 [place], [place]. Urinary Tract Infection Urinary tract infections (UTIs) can develop anywhere along your urinary tract. Your urinary tract is your body's drainage system for removing wastes and extra water. Your urinary tract includes two kidneys, two ureters, a bladder, and a urethra. Your kidneys are a pair of bean-shaped organs. Each kidney is about the size of your fist. They are located below your ribs, one on each side of your spine. CAUSES Infections are caused by microbes, which are microscopic organisms, including fungi, viruses, and bacteria. These organisms are so small that they can only be seen through a microscope. Bacteria are the microbes that most commonly cause UTIs. SYMPTOMS  Symptoms of UTIs may vary by age and gender of the patient and by the location of the infection. Symptoms in young women typically include a frequent and intense urge to urinate and a painful, burning feeling in the bladder or urethra during urination. Older women and men are more likely to be tired, shaky, and weak and have muscle aches and abdominal pain. A fever may mean the infection is in your kidneys. Other symptoms of a kidney infection include pain in your back or sides below the ribs, nausea, and vomiting. DIAGNOSIS To diagnose a UTI, your caregiver will ask you about your symptoms. Your caregiver also will ask to provide a urine sample. The urine sample will be tested for bacteria and white blood cells. White blood cells are made by your body to help fight infection. TREATMENT  Typically, UTIs can be treated with medication. Because most UTIs are caused by a bacterial infection, they usually can be treated with the use of antibiotics. The choice of antibiotic and length of treatment depend on your symptoms and the type of bacteria causing your infection. HOME CARE INSTRUCTIONS  If you were prescribed antibiotics, take them exactly as your caregiver instructs  you. Finish the medication even if you feel better after you have only taken some of the medication.  Drink enough water and fluids to keep your urine clear or pale yellow.  Avoid caffeine, tea, and carbonated beverages. They tend to irritate your bladder.  Empty your bladder often. Avoid holding urine for long periods of time.  Empty your bladder before and after sexual intercourse.  After a bowel movement, women should cleanse from front to back. Use each tissue only once. SEEK MEDICAL CARE IF:   You have back pain.  You develop a fever.  Your symptoms do not begin to resolve within 3 days. SEEK IMMEDIATE MEDICAL CARE IF:   You have severe back pain or lower abdominal pain.  You develop chills.  You have nausea or vomiting.  You have continued burning or discomfort with urination. MAKE SURE YOU:   Understand these instructions.  Will watch your condition.  Will get help right away if you are not doing well or get worse. Document Released: 02/21/2005 Document Revised: 11/13/2011 Document Reviewed: 06/22/2011 [facility] Patient Information 2014 [place], [place].

## 2013-10-20 NOTE — ED Notes (Signed)
Pt back from CT

## 2013-10-20 NOTE — Telephone Encounter (Signed)
10:52am Responded to Code at [facility] exam room. Pt c/o SOB, L lower back/flank pain. Room air O2 98%, HR 73, BP 170/ Monitor SR Pain worse with lying flat 11:01am EKG complete, NSR Patient has tremors, continues to have left flank pain, sitting up on bed 11:15 am exam completed by Dr. Graciela Husbands, would like patient to take lasix 20 mg daily, have ECHO for SOB f/u Dr. Elease Hashimoto, and see PCP for back pain. While waiting w/ patient for her husband to arrive, back pain increased to 10/10, patient became restless, repositioning, up to walk, sitting in bedside chair with no relief, crying. Patient reports having 2 kidney stones Her husband took her to ED for eval of back pain per Dr. Graciela Husbands.

## 2013-10-20 NOTE — ED Provider Notes (Signed)
CSN: [id number]     Arrival date & time 10/20/13  1211 History   First MD Initiated Contact with Patient 10/20/13 1229     Chief Complaint  Patient presents with  . Flank Pain    Patient was at her heart doctor getting a doppler for her carotid and she began to have extreme back pain.  The doppler was completed but the doctor advised her to come to the ED to be evaluated.     (Consider location/radiation/quality/duration/timing/severity/associated sxs/prior Treatment) Patient is a 80 y.o. female presenting with flank pain. The history is provided by the patient and the spouse.  Flank Pain   patient here with sudden onset of left-sided flank pain which began today prior to arrival. Does have a history of kidney stones. Denies any hematuria or dysuria. Current symptoms are similar to what she's had before in the past. Symptoms have been colicky and no treatment used prior to arrival. Nothing makes them better or worse. Has had nausea but no vomiting. Pain characterized as severe  Past Medical History  Diagnosis Date  . CAD (coronary artery disease)     s/p CABG x 5 in 1997 & CABG x 3 in 2010; Last cath in April of 2010 showed grafts to be patent. EF is 40 to 45%  . CHF (congestive heart failure)     EF is 40 to 45%  . Hyperlipemia   . Pleural effusion     post surgical  . PVD (peripheral vascular disease)     S/P right CEA  . GERD (gastroesophageal reflux disease)   . History of cerebrovascular disease   . Asthma   . Diverticulosis   . Depression     following bypass surgery  . Hypertension   . Hepatitis A   . Sleep apnea   . Stroke 2010  . Endometriosis   . Allergy   . LBP (low back pain)   . Hiatal hernia   . Mixed incontinence urge and stress 04/22/2013  . Nocturia 04/22/2013   Past Surgical History  Procedure Laterality Date  . Coronary artery bypass graft  4098,[id number],2010    Original surgery in 1997 x 5; Redo CABG x 3 in 2010 including free RIMA to distal LAD, SVG to OM  and SVG to left posterolateral  . Cardiac catheterization  April 2010    Grafts were patent. EF is 40 to 45%  . Partial colectomy      due to colon abcess  . Knee surgery      left  . Carotid endarterectomy      right  . Appendectomy     Family History  Problem Relation Age of Onset  . Heart attack Mother   . Heart attack Father   . Diabetes Mother   . Diabetes Sister   . Diabetes Maternal Grandmother   . Diabetes Paternal Grandmother   . Colon cancer Neg Hx   . Stroke Sister    History  Substance Use Topics  . Smoking status: Never Smoker   . Smokeless tobacco: Never Used  . Alcohol Use: No   OB History   Grav Para Term Preterm Abortions TAB SAB Ect Mult Living                 Review of Systems  Genitourinary: Positive for flank pain.  All other systems reviewed and are negative.     Allergies  Prochlorperazine edisylate; Metoprolol; Phenergan; Sulfonamide derivatives; Zetia; and Niacin  Home Medications  Prior to Admission medications   Medication Sig Start Date End Date Taking? Authorizing Provider  aspirin 325 MG tablet Take 325 mg by mouth daily.    Historical Provider, MD  Calcium Carbonate-Vit D-Min (CALCIUM 1200 PO) Take 1,200 mg by mouth daily.    Historical Provider, MD  Cholecalciferol (VITAMIN D PO) Take 1,000 Units by mouth daily.     Historical Provider, MD  cyclobenzaprine (FLEXERIL) 5 MG tablet Take 1 tablet (5 mg total) by mouth 3 (three) times daily as needed for muscle spasms. 05/03/13   Linwood Dibbles, MD  esomeprazole (NEXIUM) 40 MG capsule Take 1 capsule (40 mg total) by mouth daily before breakfast. 03/24/13   Mardella Layman, MD  furosemide (LASIX) 20 MG tablet Take 1 tablet (20 mg total) by mouth daily. 10/20/13   Duke Salvia, MD  HYDROcodone-acetaminophen [facility]) 10-325 MG per tablet Take 1 tablet by mouth every 6 (six) hours as needed for moderate pain.    Historical Provider, MD  isosorbide mononitrate (IMDUR) 60 MG 24 hr tablet Take 1  tablet (60 mg total) by mouth daily. 03/26/13   Vesta Mixer, MD  nebivolol (BYSTOLIC) 10 MG tablet Take 1 tablet (10 mg total) by mouth daily. 07/22/13   Vesta Mixer, MD  nitroGLYCERIN (NITROSTAT) 0.4 MG SL tablet Place 1 tablet (0.4 mg total) under the tongue every 5 (five) minutes as needed. 03/29/11   Vesta Mixer, MD  PRESCRIPTION MEDICATION Take 1 tablet by mouth daily. Pt. On a study medication    Historical Provider, MD  rosuvastatin (CRESTOR) 20 MG tablet Take 1 tablet (20 mg total) by mouth daily. 05/27/13   Vesta Mixer, MD   BP 132/74  Pulse 72  Temp(Src) 97.6 F (36.4 C) (Oral)  Resp 18  SpO2 100% Physical Exam  Nursing note and vitals reviewed. Constitutional: She is oriented to person, place, and time. She appears well-developed and well-nourished.  Non-toxic appearance. No distress.  HENT:  Head: Normocephalic and atraumatic.  Eyes: Conjunctivae, EOM and lids are normal. Pupils are equal, round, and reactive to light.  Neck: Normal range of motion. Neck supple. No tracheal deviation present. No mass present.  Cardiovascular: Normal rate, regular rhythm and normal heart sounds.  Exam reveals no gallop.   No murmur heard. Pulmonary/Chest: Effort normal and breath sounds normal. No stridor. No respiratory distress. She has no decreased breath sounds. She has no wheezes. She has no rhonchi. She has no rales.  Abdominal: Soft. Normal appearance and bowel sounds are normal. She exhibits no distension. There is no tenderness. There is CVA tenderness. There is no rigidity, no rebound and no guarding.  Musculoskeletal: Normal range of motion. She exhibits no edema and no tenderness.  Neurological: She is alert and oriented to person, place, and time. She has normal strength. No cranial nerve deficit or sensory deficit. GCS eye subscore is 4. GCS verbal subscore is 5. GCS motor subscore is 6.  Skin: Skin is warm and dry. No abrasion and no rash noted.  Psychiatric: She has  a normal mood and affect. Her speech is normal and behavior is normal.    ED Course  Procedures (including critical care time) Labs Review Labs Reviewed  URINE CULTURE  URINALYSIS, ROUTINE W REFLEX MICROSCOPIC    Imaging Review Ct Abdomen Pelvis Wo Contrast  10/20/2013   CLINICAL DATA:  Left flank pain  EXAM: CT ABDOMEN AND PELVIS WITHOUT CONTRAST  TECHNIQUE: Multidetector CT imaging of the abdomen and pelvis  was performed following the standard protocol without IV contrast.  COMPARISON:  CT 05/03/2013  FINDINGS: Partial obstruction of the left kidney due to a ureteral calculus the level of S1. This is at the level of the iliac vessel crossing. This stone measures 3.8 x 5.4 mm. Previously this stone was in the left kidney. No additional renal calculi. Right kidney is normal. Left kidney cyst measures 23 mm, unchanged from the prior study.  Lung bases are clear. Liver and bile ducts are normal. Probable small gallstones or milk of calcium in the gallbladder which is unchanged. Gallbladder wall not thickened. Pancreas and spleen are normal.  Diffuse colonic diverticulosis. No colonic thickening or mass. No mass or adenopathy. Atherosclerotic disease without aortic aneurysm.  IMPRESSION: 3.8 x 5.4 mm obstructing stone in the left kidney at the iliac crossing.   Electronically Signed   By: Marlan Palau M.D.   On: 10/20/2013 13:56     EKG Interpretation None      MDM   Final diagnoses:  None    Patient given dose of IV Rocephin here for her urinary tract infection. Kidney stone noted on CT scan. We'll prescribe meds for patient and she has a urologist who she can follow up with tomorrow    Toy Baker, MD 10/20/13 [phone]

## 2013-10-20 NOTE — ED Notes (Signed)
Patient was at her heart doctor getting a doppler for her carotid and she began to have extreme back pain.  The doppler was completed but the doctor advised her to come to the ED to be evaluated.  The patient has been seen by Dr. Tanya Nones a Urologist and he advised her she had two small kidney stones however they were not causing any problems.  The patient is in extreme pain in her left falnk and rates her pain 10/10.  Patient is here to be evaluated.

## 2013-10-20 NOTE — Progress Notes (Unsigned)
CTSP in PV because of acute orthopnea   This is a longstanding problem of >1 yr that frewquently occurs initially upon lying down, and then gradually abates It is assoc with left flank pain  She has known CAD w with prior CABG and redo 2010 with EF 2014  40-45  On arrival she was anxious and dyspneic With BP 170 sys, pulse 76 and regular  Tel>>sinus  ECG unchanged JVP 8 cm tremulousness  Imp  Positional dyspnea  Flank Pain  Edema  Dyspnea on exertion  i am not sure the mechanism, but the inciting with changes in position and the fact that it abates with prolonged lying suggests it may be related to the changing position, ie muskulosleletal  She has DOE and some edema and last assessment of Ef in 10/14 by myoview 40-45 and underwent cath in response which demonstrated graft obstruction but otherwise looked ok  Would discharge to home and arrange echo  She also needs to see PCP to explore the cause of the low back pain  Berton Mount

## 2013-10-20 NOTE — Progress Notes (Signed)
Carotid duplex performed.  Please see Dr. Odessa Fleming note regarding patient.

## 2013-10-21 LAB — URINE CULTURE: Colony Count: >=100000

## 2013-10-22 ENCOUNTER — Telehealth (HOSPITAL_BASED_OUTPATIENT_CLINIC_OR_DEPARTMENT_OTHER): Payer: Self-pay | Admitting: Emergency Medicine

## 2013-10-22 NOTE — Progress Notes (Signed)
ED Antimicrobial Stewardship Positive Culture Follow Up   [name] is an 80 y.o. female who presented to [facility] on 10/20/2013 with a chief complaint of  Chief Complaint  Patient presents with  . Flank Pain    Patient was at her heart doctor getting a doppler for her carotid and she began to have extreme back pain.  The doppler was completed but the doctor advised her to come to the ED to be evaluated.    Recent Results (from the past 720 hour(s))  URINE CULTURE     Status: None   Collection Time    10/20/13  4:32 PM      Result Value Ref Range Status   Specimen Description URINE, CLEAN CATCH   Final   Special Requests NONE   Final   Culture  Setup Time     Final   Value: 10/20/2013 21:28     Performed at [facility] Count     Final   Value: >=100,000 COLONIES/ML     Performed at [facility]   Culture     Final   Value: KLEBSIELLA PNEUMONIAE     Performed at [facility]   Report Status 10/21/2013 FINAL   Final   Organism ID, Bacteria KLEBSIELLA PNEUMONIAE   Final    [x]  Patient discharged originally without antimicrobial agent and treatment is now indicated  New antibiotic prescription: Keflex 500 mg PO BID for 7 days  ED Provider: Raymon Mutton, PA-C   Anabel Bene 10/22/2013, 11:21 AM Infectious Diseases Pharmacist Phone# [phone]

## 2013-10-22 NOTE — Telephone Encounter (Signed)
Spoke with patient's husband who states a message about an appointment was left on his machine but he and the patient could not understand what was said.  I verified appointment for echo on June 15 with husband who verbalized understanding and agreement.  Husband states patient passed 2 kidney stones yesterday.

## 2013-10-22 NOTE — Telephone Encounter (Signed)
F/u   Pt husband is calling back in reference to call pt received yesterday. Pt was confused and didn't understand what was told to her. Please call pt.

## 2013-10-22 NOTE — Telephone Encounter (Signed)
Post ED Visit - Positive Culture Follow-up: Successful Patient Follow-Up  Culture assessed and recommendations reviewed by: []  Wes Dulaney, Pharm.D., BCPS []  Celedonio Miyamoto, Pharm.D., BCPS []  Georgina Pillion, [address]D., BCPS []  [place], [address].D., BCPS, AAHIVP []  Estella Husk, Pharm.D., BCPS, AAHIVP [x]  Anabel Bene, Pharm.D.  Positive urine culture  [x]  Patient discharged without antimicrobial prescription and treatment is now indicated []  Organism is resistant to prescribed ED discharge antimicrobial []  Patient with positive blood cultures  Changes discussed with ED provider: Raymon Mutton PA-C New antibiotic prescription: Keflex 500 mg PO BID x 7 days    [facility] 10/22/2013, 11:33 AM

## 2013-10-27 NOTE — Telephone Encounter (Signed)
Rx for Keflex 500 mg PO BID x 7 days, prescribed by [facility], called to [facility] on [address] ([phone]) by Norm Parcel PFM.

## 2013-11-09 ENCOUNTER — Ambulatory Visit (HOSPITAL_COMMUNITY): Payer: Medicare Other | Attending: Cardiology | Admitting: Radiology

## 2013-11-09 DIAGNOSIS — R0602 Shortness of breath: Secondary | ICD-10-CM | POA: Insufficient documentation

## 2013-11-09 DIAGNOSIS — R0600 Dyspnea, unspecified: Secondary | ICD-10-CM

## 2013-11-09 DIAGNOSIS — Z8673 Personal history of transient ischemic attack (TIA), and cerebral infarction without residual deficits: Secondary | ICD-10-CM | POA: Insufficient documentation

## 2013-11-09 DIAGNOSIS — I059 Rheumatic mitral valve disease, unspecified: Secondary | ICD-10-CM | POA: Insufficient documentation

## 2013-11-09 DIAGNOSIS — G4730 Sleep apnea, unspecified: Secondary | ICD-10-CM | POA: Insufficient documentation

## 2013-11-09 DIAGNOSIS — R079 Chest pain, unspecified: Secondary | ICD-10-CM | POA: Insufficient documentation

## 2013-11-09 DIAGNOSIS — I2510 Atherosclerotic heart disease of native coronary artery without angina pectoris: Secondary | ICD-10-CM | POA: Insufficient documentation

## 2013-11-09 DIAGNOSIS — I079 Rheumatic tricuspid valve disease, unspecified: Secondary | ICD-10-CM | POA: Insufficient documentation

## 2013-11-09 NOTE — Progress Notes (Signed)
Echocardiogram performed.  

## 2013-11-16 ENCOUNTER — Other Ambulatory Visit: Payer: Self-pay | Admitting: Family Medicine

## 2013-11-18 ENCOUNTER — Telehealth: Payer: Self-pay | Admitting: Nurse Practitioner

## 2013-11-18 NOTE — Telephone Encounter (Signed)
The echo shows normal LV systolic function. She has mild diastolic dysfunction. Otherwise, fairly normal. We can discuss in detail at her office visit. ----- Message ----- From: Levi AlandMichelle M Swinyer, RN Sent: 11/16/2013 10:53 AM To: Vesta MixerPhilip J Nahser, MD  Left message for patient to call office for test results

## 2013-11-19 NOTE — Telephone Encounter (Signed)
Patient would like test results. Please call and advice.

## 2013-11-19 NOTE — Telephone Encounter (Signed)
LMTCB 6/25

## 2013-11-19 NOTE — Telephone Encounter (Signed)
PT  NOTIFIED ./CY 

## 2013-12-14 ENCOUNTER — Telehealth: Payer: Self-pay | Admitting: Cardiovascular Disease

## 2013-12-14 NOTE — Telephone Encounter (Signed)
Received call directly from operator.  Patient's husband states patient needs to see Dr. Elease HashimotoNahser for SOB with activity, decreased appetite, and fatigue.  Patient's husband states patient has SOB when she is active although she is rarely very active; states when she gets up to go to bathroom during the night she is panting when she gets back in the bed.  Husband states patient has had tests and [facility] but has not been given reason for these symptoms; states patient cannot wait until September to see Dr. Elease HashimotoNahser - would like to see him as soon as possible.  Husband verifies that these are not new symptoms for this patient, just that symptoms continue without known reason.  I advised that Dr. Elease HashimotoNahser [place].  Patient's husband states they would like to take this appointment and address and appointment time were verified.  Husband verbalized understanding and agreement.

## 2013-12-14 NOTE — Telephone Encounter (Signed)
New problem   Pt is having SOB and no stamina per pt's spouse calling.

## 2013-12-15 ENCOUNTER — Ambulatory Visit (INDEPENDENT_AMBULATORY_CARE_PROVIDER_SITE_OTHER): Payer: Medicare Other | Admitting: Cardiovascular Disease

## 2013-12-15 ENCOUNTER — Encounter: Payer: Self-pay | Admitting: *Deleted

## 2013-12-15 ENCOUNTER — Encounter: Payer: Self-pay | Admitting: Cardiovascular Disease

## 2013-12-15 VITALS — BP 124/60 | HR 76 | Ht 64.0 in | Wt 154.4 lb

## 2013-12-15 DIAGNOSIS — I5032 Chronic diastolic (congestive) heart failure: Secondary | ICD-10-CM | POA: Insufficient documentation

## 2013-12-15 DIAGNOSIS — I509 Heart failure, unspecified: Secondary | ICD-10-CM

## 2013-12-15 DIAGNOSIS — R55 Syncope and collapse: Secondary | ICD-10-CM

## 2013-12-15 MED ORDER — POTASSIUM CHLORIDE ER 10 MEQ PO TBCR: 10.0000 meq | EXTENDED_RELEASE_TABLET | Freq: Every day | ORAL | Status: DC

## 2013-12-15 MED ORDER — FUROSEMIDE 20 MG PO TABS: 20.0000 mg | ORAL_TABLET | Freq: Every day | ORAL | Status: DC

## 2013-12-15 MED ORDER — ASPIRIN EC 81 MG PO TBEC: 81.0000 mg | DELAYED_RELEASE_TABLET | Freq: Every day | ORAL | Status: DC

## 2013-12-15 NOTE — Progress Notes (Signed)
[name] Date of Birth  1928/07/11       [facility]    [place] [address]  [address] [place], [place]  [address]   [place], [place]  [address] [phone]     [phone]   Fax  [phone]    Fax [phone]  Problem List: 1. Coronary artery disease: She status post CABG in 1997 and again in 2010. Heart catheterization in April 2010 and revealed severe native coronary artery disease. The LAD is occluded after giving off 2 large septal branches. The left circumflex artery has moderate irregularities and has competitive flow from the saphenous vein graft to the acute marginal artery. The right coronary artery is occluded. The SVG to the first obtuse marginal has minor luminal irregularities. The free RIMA originates off the hood of the SVG. It is nice graft and anastomosis to the distal LAD is normal. The SVG to right coronary artery is smooth and normal. The posterior descending artery and the posterior lateral segment artery are normal. Her ejection fraction is 40-45%. 2. Congestive heart failure-EF of 40-45% 3. Hyperlipidemia 4. Peripheral vascular disease status post right carotid endarterectomy  History of Present Illness:  [name] is an 80 y.o. female with the above noted history.   She's not had any episodes of chest pain. She does note lots of fatigue and as a result she has not been exercising much on a treadmill. She gets out of breath community slight activity.  She's not had any syncope or presyncope.  June 09, 2012 She is doing well.  She is just getting over a bad cold.  She is not exercising much this winter.  She was walking about 45 minutes a day prior to the winter months.   She has had some muscular shoulder pain but no angina.   Sept. 14, 2015:  [name] is doing well.  No problems.  No angina.   She is enrolled in [employer] but doesn't go often.  She sits quite a bit during the day. She has some dyspena -  especially with exertion.   Nov. 10, 2014  She is doing well.  She walking and exercising .  No significant CP.  She had an abnormal myoview and subsequent cath revealed an occluded SVG to OM.    Left mainstem: There is eccentric 40-50% narrowing of the left main. The left main is relatively short.  Left anterior descending (LAD): The LAD is diffusely diseased in the proximal vessel and is occluded after the second septal perforator.  Left circumflex (LCx): The left circumflex is a large vessel that continues in the AV groove into a posterior lateral branch. The first obtuse marginal vessel is occluded. There are left to left collaterals to the OM.  Right coronary artery (RCA): The right coronary is occluded proximally.  The free RIMA graft to the LAD is widely patent with good runoff. There is mild angulation of this graft at the anastomosis with the vein graft hood.  The saphenous vein graft to the first OM is occluded proximally.  The saphenous vein graft to the right coronary is widely patent with good runoff.  Left ventriculography: Left ventricular systolic function is normal, LVEF is estimated at 55-65%, there is no significant mitral regurgitation  Final Conclusions:  1. 3 vessel occlusive coronary disease.  2. Patent free RIMA graft to the LAD  3. Patent SVG to the RCA.  4. Occluded saphenous vein graft to  the OM1.  5. Normal LV function.  Recommendations: Recommend medical management.  She seems to be doing well.  August 31, 2013:  [name] is about the same.   She remains short of breath.  Not exercising.  No angina  December 15, 2013:  [name] presents today with various complaints.  She has severe shortness breath with very little exertion. When she walks from again to the bathroom she gets very short of breath. He has a generalized lack of energy. Her appetite is poor.  No angina .  She has PND and 2 pillow orthopnea.    Jan echocardiogram in June, 2015 which was essentially  normal. Chest normal left ventricle systolic function and no significant valvular abnormalities. She had a TSH in May, 2015 which was normal. Her medical doctor tried he on Lasix and flomax    She did not notice any difference in her breathing with the lasix - it just made her go to the bathroom more.    Current Outpatient Prescriptions on File Prior to Visit  Medication Sig Dispense Refill  . aspirin 325 MG tablet Take 325 mg by mouth daily.      . Calcium Carbonate-Vit D-Min (CALCIUM 1200 PO) Take 2,400 mg by mouth daily.       Marland Kitchen. esomeprazole (NEXIUM) 40 MG capsule Take 40 mg by mouth daily at 12 noon.      . isosorbide mononitrate (IMDUR) 60 MG 24 hr tablet Take 1 tablet (60 mg total) by mouth daily.  90 tablet  3  . nebivolol (BYSTOLIC) 10 MG tablet Take 1 tablet (10 mg total) by mouth daily.  30 tablet  11  . nitroGLYCERIN (NITROSTAT) 0.4 MG SL tablet Place 1 tablet (0.4 mg total) under the tongue every 5 (five) minutes as needed.  90 tablet  1  . PRESCRIPTION MEDICATION Take 1 tablet by mouth daily. Pt. On a study medication      . rosuvastatin (CRESTOR) 20 MG tablet Take 1 tablet (20 mg total) by mouth daily.  30 tablet  6   No current facility-administered medications on file prior to visit.    Allergies  Allergen Reactions  . Prochlorperazine Edisylate Anaphylaxis  . Metoprolol Other (See Comments)    alopecia  . Phenergan [Promethazine Hcl] Other (See Comments)    Facial numbness  . Sulfonamide Derivatives Nausea And Vomiting  . Zetia [Ezetimibe] Nausea Only    And abdominal pain  . Niacin Rash    Past Medical History  Diagnosis Date  . CAD (coronary artery disease)     s/p CABG x 5 in 1997 & CABG x 3 in 2010; Last cath in April of 2010 showed grafts to be patent. EF is 40 to 45%  . CHF (congestive heart failure)     EF is 40 to 45%  . Hyperlipemia   . Pleural effusion     post surgical  . PVD (peripheral vascular disease)     S/P right CEA  . GERD  (gastroesophageal reflux disease)   . History of cerebrovascular disease   . Asthma   . Diverticulosis   . Depression     following bypass surgery  . Hypertension   . Hepatitis A   . Sleep apnea   . Stroke 2010  . Endometriosis   . Allergy   . LBP (low back pain)   . Hiatal hernia   . Mixed incontinence urge and stress 04/22/2013  . Nocturia 04/22/2013    Past Surgical History  Procedure Laterality Date  . Coronary artery bypass graft  1610,9604    Original surgery in 1997 x 5; Redo CABG x 3 in 2010 including free RIMA to distal LAD, SVG to OM and SVG to left posterolateral  . Cardiac catheterization  April 2010    Grafts were patent. EF is 40 to 45%  . Partial colectomy      due to colon abcess  . Knee surgery      left  . Carotid endarterectomy      right  . Appendectomy      History  Smoking status  . Never Smoker   Smokeless tobacco  . Never Used    History  Alcohol Use No    Family History  Problem Relation Age of Onset  . Heart attack Mother   . Heart attack Father   . Diabetes Mother   . Diabetes Sister   . Diabetes Maternal Grandmother   . Diabetes Paternal Grandmother   . Colon cancer Neg Hx   . Stroke Sister     Reviw of Systems:  Reviewed in the HPI.  All other systems are negative.  Physical Exam: Blood pressure 124/60, pulse 76, height 5\' 4"  (1.626 m), weight 154 lb 6.4 oz (70.035 kg). General: Well developed, well nourished, in no acute distress. Head: Normocephalic, atraumatic, sclera non-icteric, mucus membranes are moist,  Neck: Supple. Carotids are 2 + without bruits. No JVD.  She has a right carotid endarterectomy scar. Lungs: Clear bilaterally to auscultation. Heart: regular rate.  normal  S1 S2. No murmurs, gallops or rubs. Abdomen: Soft, non-tender, non-distended with normal bowel sounds. No hepatomegaly. No rebound/guarding. No masses. Msk:  Strength and tone are normal Extremities: No clubbing or cyanosis. No edema.  Distal  pedal pulses are 2+ and equal bilaterally. Neuro: Alert and oriented X 3. Moves all extremities spontaneously. Psych:  Responds to questions appropriately with a normal affect.  ECG: 08/31/2013: Normal sinus rhythm at 75 beats a minute. She is possible left atrial enlargement. There is an old septal myocardial infarction. Assessment / Plan:

## 2013-12-15 NOTE — Assessment & Plan Note (Signed)
[name] Presents with symptoms that are consistent with worsening chronic diastolic congestive heart failure. She's tried Lasix before but for some reason didn't tolerate appear we'll try her on low-dose furosemide-20 mg a day with potassium chloride 10 mg a day. We'll check a basic metabolic profile in 3 weeks.  We'll schedule her for a Lexiscan Myoview to evaluate the possibility of ischemia.  I will see her in 3 months.

## 2013-12-15 NOTE — Patient Instructions (Addendum)
Your physician has recommended you make the following change in your medication:  Decrease Aspirin 81 mg  Continue Lasix 20 mg once daily  Start Potassium 10 meq once daily   LEXISCAN MYOVIEW  Your caregiver has ordered a Stress Test with nuclear imaging. The purpose of this test is to evaluate the blood supply to your heart muscle. This procedure is referred to as a "Non-Invasive Stress Test." This is because other than having an IV started in your vein, nothing is inserted or "invades" your body. Cardiac stress tests are done to find areas of poor blood flow to the heart by determining the extent of coronary artery disease (CAD). Some patients exercise on a treadmill, which naturally increases the blood flow to your heart, while others who are  unable to walk on a treadmill due to physical limitations have a pharmacologic/chemical stress agent called Lexiscan . This medicine will mimic walking on a treadmill by temporarily increasing your coronary blood flow.   Please note: these test may take anywhere between 2-4 hours to complete  PLEASE REPORT TO OUR [place] OFFICE LOCATED ON [address]  Date of Procedure:__________7/27/15___________________________  Arrival Time for Procedure:_______0815 AM _______________________  Instructions regarding medication:    __X__:  Hold betablocker(s)  morning of procedure: BYSTOLIC    PLEASE NOTIFY THE OFFICE AT LEAST 24 HOURS IN ADVANCE IF YOU ARE UNABLE TO KEEP YOUR APPOINTMENT.  [phone]  How to prepare for your Myoview test:  1. Do not eat or drink after midnight 2. No caffeine for 24 hours prior to test 3. No smoking 24 hours prior to test. 4. Your medication may be taken with water.  If your doctor stopped a medication because of this test, do not take that medication. 5. Ladies, please do not wear dresses.  Skirts or pants are appropriate. Please wear a short sleeve shirt. 6. No perfume, cologne or lotion. 7. Wear comfortable  walking shoes. No heels!  Your physician recommends that you return for lab work in:  [facility]   Your physician recommends that you schedule a follow-up appointment in:  3 months

## 2013-12-21 ENCOUNTER — Ambulatory Visit (HOSPITAL_COMMUNITY): Payer: Medicare Other | Attending: Cardiology | Admitting: Radiology

## 2013-12-21 VITALS — BP 119/52 | Ht 64.0 in | Wt 150.0 lb

## 2013-12-21 DIAGNOSIS — Z8673 Personal history of transient ischemic attack (TIA), and cerebral infarction without residual deficits: Secondary | ICD-10-CM | POA: Insufficient documentation

## 2013-12-21 DIAGNOSIS — I5189 Other ill-defined heart diseases: Secondary | ICD-10-CM | POA: Diagnosis not present

## 2013-12-21 DIAGNOSIS — Z951 Presence of aortocoronary bypass graft: Secondary | ICD-10-CM | POA: Insufficient documentation

## 2013-12-21 DIAGNOSIS — R079 Chest pain, unspecified: Secondary | ICD-10-CM

## 2013-12-21 DIAGNOSIS — R0602 Shortness of breath: Secondary | ICD-10-CM

## 2013-12-21 DIAGNOSIS — I252 Old myocardial infarction: Secondary | ICD-10-CM | POA: Diagnosis not present

## 2013-12-21 DIAGNOSIS — I2510 Atherosclerotic heart disease of native coronary artery without angina pectoris: Secondary | ICD-10-CM | POA: Diagnosis not present

## 2013-12-21 DIAGNOSIS — Z8249 Family history of ischemic heart disease and other diseases of the circulatory system: Secondary | ICD-10-CM | POA: Diagnosis not present

## 2013-12-21 DIAGNOSIS — B159 Hepatitis A without hepatic coma: Secondary | ICD-10-CM | POA: Diagnosis not present

## 2013-12-21 DIAGNOSIS — R55 Syncope and collapse: Secondary | ICD-10-CM

## 2013-12-21 DIAGNOSIS — R0609 Other forms of dyspnea: Secondary | ICD-10-CM | POA: Diagnosis present

## 2013-12-21 MED ORDER — REGADENOSON 0.4 MG/5ML IV SOLN
0.4000 mg | Freq: Once | INTRAVENOUS | Status: AC
  Administered 2013-12-21: 0.4 mg via INTRAVENOUS

## 2013-12-21 MED ORDER — TECHNETIUM TC 99M SESTAMIBI GENERIC - CARDIOLITE
10.0000 | Freq: Once | INTRAVENOUS | Status: AC | PRN
  Administered 2013-12-21: 10 via INTRAVENOUS

## 2013-12-21 MED ORDER — AMINOPHYLLINE 25 MG/ML IV SOLN
75.0000 mg | Freq: Once | INTRAVENOUS | Status: AC
  Administered 2013-12-21: 75 mg via INTRAVENOUS

## 2013-12-21 MED ORDER — TECHNETIUM TC 99M SESTAMIBI GENERIC - CARDIOLITE
30.0000 | Freq: Once | INTRAVENOUS | Status: AC | PRN
  Administered 2013-12-21: 30 via INTRAVENOUS

## 2013-12-21 NOTE — Progress Notes (Signed)
[facility] SITE 3 NUCLEAR MED [address] [place], [place] [address] [phone]    Cardiology Nuclear Med Study  [name] is a 80 y.o. female     MRN : [id number]     DOB: 14-May-1929  Procedure Date: 12/21/2013  Nuclear Med Background Indication for Stress Test:  Evaluation for Ischemia and Graft Patency History:  Asthma and CAD-MI-CATH-CABG '97/'10 occ SVG-OM '14 MPI: Not gated  reversible defect in OM area, CHF, Hep A Cardiac Risk Factors: Carotid Disease, CVA, Family History - CAD, Hypertension, LBBB, Lipids and PVD  Symptoms:  DOE, Fatigue and SOB   Nuclear Pre-Procedure Caffeine/Decaff Intake:  None NPO After: 7:00pm   Lungs: clear O2 Sat: 97% on room air. IV 0.9% NS with Angio Cath:  22g  IV Site: R Hand  IV Started by:  Cathlyn Parsons, RN  Chest Size (in):  34 Cup Size: B  Height: 5\' 4"  (1.626 m)  Weight:  150 lb (68.04 kg)  BMI:  Body mass index is 25.73 kg/(m^2). Tech Comments:  No Bystolic x 24 hrs. Aminophylline 75 mg IV given for symptoms. All were resolved before leaving.    Nuclear Med Study 1 or 2 day study: 1 day  Stress Test Type:  Lexiscan  Reading MD: n/a  Order Authorizing Provider:  Jannette Spanner  Resting Radionuclide: Technetium 37m Sestamibi  Resting Radionuclide Dose: 11.0 mCi   Stress Radionuclide:  Technetium 74m Sestamibi  Stress Radionuclide Dose: 33.0 mCi           Stress Protocol Rest HR: 67 Stress HR: 90  Rest BP: 119/52 Stress BP: 134/73  Exercise Time (min): n/a METS: n/a   Predicted Max HR: 138 bpm % Max HR: 65.22 bpm Rate Pressure Product: 21308   Dose of Adenosine (mg):  n/a Dose of Lexiscan: 0.4 mg  Dose of Atropine (mg): n/a Dose of Dobutamine: n/a mcg/kg/min (at max HR)  Stress Test Technologist: Milana Na, EMT-P  Nuclear Technologist:  Harlow Asa, CNMT     Rest Procedure:  Myocardial perfusion imaging was performed at rest 45 minutes following the intravenous administration of Technetium  60m Sestamibi. Rest ECG: NSR-LBBB  Stress Procedure:  The patient received IV Lexiscan 0.4 mg over 15-seconds.  Technetium 57m Sestamibi injected at 30-seconds. This patient had dizziness, sob, and weakness with the Lexiscan injection. Quantitative spect images were obtained after a 45 minute delay. Stress ECG: There are scattered PVCs.  QPS Raw Data Images:  Mild diaphragmatic attenuation.  Normal left ventricular size. Stress Images:  There is decreased uptake in the mid and distal anterolateral wall. Rest Images:  Normal homogeneous uptake in all areas of the myocardium. Subtraction (SDS):  Very small defect in the mid and distal anterolateral wall that could be due to overlying breast and and diaphragmatic attenuation but cannot rule out a small area of ischemia Transient Ischemic Dilatation (Normal <1.22):  0.98 Lung/Heart Ratio (Normal <0.45):  0.34  Quantitative Gated Spect Images QGS EDV:  65 ml QGS ESV:  20 ml  Impression Exercise Capacity:  Lexiscan with no exercise. BP Response:  Normal blood pressure response. Clinical Symptoms:  There is dyspnea. ECG Impression:  Baseline:  LBBB.  EKG uninterpretable due to LBBB at rest and stress. Comparison with Prior Nuclear Study: No significant change from previous study  Overall Impression:  Low risk stress nuclear study with a very small reversibel defect in the mid and basal anterolateral wall which was noted on nuclear stress test a  year ago.  This could be due to diaphragmatic and breast attenuation but cannot rule out a very small area of ischemia..  LV Ejection Fraction: 69%.  LV Wall Motion:  NL LV Function; NL Wall Motion  Signed: Armanda Magicraci Turner, MD [facility]

## 2013-12-30 ENCOUNTER — Ambulatory Visit (INDEPENDENT_AMBULATORY_CARE_PROVIDER_SITE_OTHER): Payer: Medicare Other | Admitting: Family Medicine

## 2013-12-30 ENCOUNTER — Ambulatory Visit
Admission: RE | Admit: 2013-12-30 | Discharge: 2013-12-30 | Disposition: A | Discharge status: Home / Self Care | Payer: 59 | Source: Ambulatory Visit | Attending: Family Medicine | Admitting: Family Medicine

## 2013-12-30 ENCOUNTER — Encounter: Payer: Self-pay | Admitting: Family Medicine

## 2013-12-30 VITALS — BP 138/82 | HR 76 | Temp 98.2°F | Resp 14 | Ht 63.0 in | Wt 152.0 lb

## 2013-12-30 DIAGNOSIS — N10 Acute tubulo-interstitial nephritis: Secondary | ICD-10-CM

## 2013-12-30 DIAGNOSIS — R300 Dysuria: Secondary | ICD-10-CM

## 2013-12-30 DIAGNOSIS — R109 Unspecified abdominal pain: Secondary | ICD-10-CM

## 2013-12-30 DIAGNOSIS — R55 Syncope and collapse: Secondary | ICD-10-CM

## 2013-12-30 LAB — URINALYSIS, MICROSCOPIC ONLY
Casts: NONE SEEN
Crystals: NONE SEEN

## 2013-12-30 LAB — CBC W/MCH & 3 PART DIFF
HCT: 44.1 % (ref 36.0–46.0)
Hemoglobin: 14.5 g/dL (ref 12.0–15.0)
Lymphocytes Relative: 6 % — ABNORMAL LOW (ref 12–46)
Lymphs Abs: 1.2 10*3/uL (ref 0.7–4.0)
MCH: 30.5 pg (ref 26.0–34.0)
MCHC: 32.9 g/dL (ref 30.0–36.0)
MCV: 92.6 fL (ref 78.0–100.0)
Neutro Abs: 17.9 10*3/uL — ABNORMAL HIGH (ref 1.7–7.7)
Neutrophils Relative %: 89 % — ABNORMAL HIGH (ref 43–77)
Platelets: 263 10*3/uL (ref 150–400)
RBC: 4.76 MIL/uL (ref 3.87–5.11)
RDW: 15 % (ref 11.5–15.5)
WBC mixed population %: 5 % (ref 3–18)
WBC mixed population: 1.1 10*3/uL (ref 0.1–1.8)
WBC: 20.1 10*3/uL — ABNORMAL HIGH (ref 4.0–10.5)

## 2013-12-30 LAB — URINALYSIS, ROUTINE W REFLEX MICROSCOPIC
Bilirubin Urine: NEGATIVE
Glucose, UA: NEGATIVE mg/dL
Ketones, ur: NEGATIVE mg/dL
Nitrite: POSITIVE — AB
Protein, ur: 30 mg/dL — AB
Specific Gravity, Urine: >1.03 — ABNORMAL HIGH (ref 1.005–1.030)
Urobilinogen, UA: 0.2 mg/dL (ref 0.0–1.0)
pH: 5.5 (ref 5.0–8.0)

## 2013-12-30 LAB — BASIC METABOLIC PANEL WITH GFR
BUN: 16 mg/dL (ref 6–23)
CO2: 26 mEq/L (ref 19–32)
Calcium: 10 mg/dL (ref 8.4–10.5)
Chloride: 98 mEq/L (ref 96–112)
Creat: 1.14 mg/dL — ABNORMAL HIGH (ref 0.50–1.10)
GFR, Est African American: 52 mL/min — ABNORMAL LOW
GFR, Est Non African American: 45 mL/min — ABNORMAL LOW
Glucose, Bld: 134 mg/dL — ABNORMAL HIGH (ref 70–99)
Potassium: 4.1 mEq/L (ref 3.5–5.3)
Sodium: 138 mEq/L (ref 135–145)

## 2013-12-30 MED ORDER — OXYCODONE-ACETAMINOPHEN 5-325 MG PO TABS: 1.0000 | ORAL_TABLET | Freq: Three times a day (TID) | ORAL | Status: DC | PRN

## 2013-12-30 MED ORDER — CEFTRIAXONE SODIUM 1 G IJ SOLR
500.0000 mg | Freq: Once | INTRAMUSCULAR | Status: AC
  Administered 2013-12-30: 500 mg via INTRAMUSCULAR

## 2013-12-30 MED ORDER — ONDANSETRON HCL 4 MG PO TABS: 4.0000 mg | ORAL_TABLET | Freq: Three times a day (TID) | ORAL | Status: DC | PRN

## 2013-12-30 MED ORDER — CIPROFLOXACIN HCL 500 MG PO TABS: 500.0000 mg | ORAL_TABLET | Freq: Two times a day (BID) | ORAL | Status: DC

## 2013-12-30 MED ORDER — TAMSULOSIN HCL 0.4 MG PO CAPS: 0.4000 mg | ORAL_CAPSULE | Freq: Every day | ORAL | Status: DC

## 2013-12-30 NOTE — Progress Notes (Signed)
Patient ID: [name], female   DOB: [birth year], 80 y.o.   MRN: [id number]   Subjective:    Patient ID: [name], female    DOB: [birth year], 80 y.o.   MRN: [id number]  Patient presents for Possible Kidney Stone  Patient here with her husband. She is a fairly poor historian however her husband does all her medications. She has history of kidney stones as well as urinary incontinence and bladder issues which she did fall with urology for some time but has not seen them in the past month. Over the past couple days she started having left-sided flank pain as well as dysuria urinary frequency however she's only been dribbling small amounts of urine. She's not had any fever but feels nauseous. She states she feels similar to when she had an infection a couple months ago. Her husband states that he did give her the last pain pill as well as a nausea pill this morning which helps. She's no longer on Flomax she was only on this when she had a kidney stone for a couple weeks. She denies any chest pain or shortness of breath.   Review Of Systems:  GEN-+ fatigue, fever, weight loss,weakness, recent illness HEENT- denies eye drainage, change in vision, nasal discharge, CVS- denies chest pain, palpitations RESP- denies SOB, cough, wheeze ABD- denies N/V, change in stools, abd pain GU- +dysuria, hematuria, dribbling,+ incontinence MSK- + joint pain, muscle aches, injury Neuro- denies headache, dizziness, syncope, seizure activity       Objective:    BP 138/82  Pulse 76  Temp(Src) 98.2 F (36.8 C) (Oral)  Resp 14  Ht 5\' 3"  (1.6 m)  Wt 152 lb (68.947 kg)  BMI 26.93 kg/m2 GEN- NAD, alert and oriented x3, non toxic appearing, mild dementia HEENT- PERRL, EOMI, non injected sclera, pink conjunctiva, MMM, oropharynx clear Neck- Supple, no LAD CVS- RRR, no murmur RESP-CTAB ABD-NABS,soft,ND,  Suprapubic tenderness, +CVA tenderness left side EXT- No edema Pulses- Radial 2+        Assessment &  Plan:      Problem List Items Addressed This Visit   None    Visit Diagnoses   Acute pyelonephritis    -  Primary Based on her examination as well as her elevated white blood cell count and her history I would treat her for pyelonephritis. It is possible that she has an underlying stone as well. I've given her Rocephin 500 mg IM in the office. Then Cipro 500mg  BID  She will take Zofran as well as Percocet for pain she will try to increase her fluid intake.  KUB will be done regarding possible stone. Advised to take her to the ER if she does not improve. She's afebrile here in the office and nontoxic appearing. If she is doing well otherwise at home I will see her on Friday for recheck     Relevant Medications       cefTRIAXone (ROCEPHIN) injection 500 mg (Completed)    Other Relevant Orders       Urine culture       BASIC METABOLIC PANEL WITH GFR    Dysuria        Relevant Orders       Urinalysis, Routine w reflex microscopic (Completed)       CBC w/[facility] & 3 Part Diff (Completed)       BASIC METABOLIC PANEL WITH GFR  Flank pain        Relevant Orders       DG Abd 1 View       BASIC METABOLIC PANEL WITH GFR       Note: This dictation was prepared with Dragon dictation along with smaller phrase technology. Any transcriptional errors that result from this process are unintentional.

## 2013-12-30 NOTE — Patient Instructions (Signed)
Take nausea medication and flomax for possible kidney stone  Start antibiotic pill tomorrow Plenty of fluids  Get the xray done If she gets worse go to ER otherwise F/U Friday For RECHECK

## 2013-12-31 NOTE — Progress Notes (Signed)
Patient returned call and made aware.

## 2014-01-01 ENCOUNTER — Ambulatory Visit (INDEPENDENT_AMBULATORY_CARE_PROVIDER_SITE_OTHER): Payer: Medicare Other | Admitting: Family Medicine

## 2014-01-01 ENCOUNTER — Encounter: Payer: Self-pay | Admitting: Family Medicine

## 2014-01-01 VITALS — BP 122/68 | HR 64 | Temp 98.1°F | Resp 14 | Ht 63.0 in | Wt 152.0 lb

## 2014-01-01 DIAGNOSIS — N10 Acute tubulo-interstitial nephritis: Secondary | ICD-10-CM

## 2014-01-01 DIAGNOSIS — N179 Acute kidney failure, unspecified: Secondary | ICD-10-CM

## 2014-01-01 LAB — CBC W/MCH & 3 PART DIFF
HCT: 37.4 % (ref 36.0–46.0)
Hemoglobin: 12.2 g/dL (ref 12.0–15.0)
Lymphocytes Relative: 14 % (ref 12–46)
Lymphs Abs: 1.3 10*3/uL (ref 0.7–4.0)
MCH: 30.5 pg (ref 26.0–34.0)
MCHC: 32.6 g/dL (ref 30.0–36.0)
MCV: 93.5 fL (ref 78.0–100.0)
Neutro Abs: 7.2 10*3/uL (ref 1.7–7.7)
Neutrophils Relative %: 78 % — ABNORMAL HIGH (ref 43–77)
Platelets: 182 10*3/uL (ref 150–400)
RBC: 4 MIL/uL (ref 3.87–5.11)
RDW: 15.2 % (ref 11.5–15.5)
WBC mixed population %: 8 % (ref 3–18)
WBC mixed population: 0.8 10*3/uL (ref 0.1–1.8)
WBC: 9.2 10*3/uL (ref 4.0–10.5)

## 2014-01-01 LAB — URINE CULTURE: Colony Count: >=100000

## 2014-01-01 NOTE — Patient Instructions (Signed)
Complete antibiotics Bloodwork is improved Increase food intake and fluids F/U as needed

## 2014-01-01 NOTE — Assessment & Plan Note (Signed)
Her white blood cell count is significantly improved her urine culture shows Escherichia coli the sensitivities however not back however continue with ciprofloxacin, continue to push fluids and try to increase her by mouth intake Exam is improved today

## 2014-01-01 NOTE — Progress Notes (Signed)
Patient ID: [name], female   DOB: [birth year], 80 y.o.   MRN: [id number]   Subjective:    Patient ID: [name], female    DOB: [birth year], 80 y.o.   MRN: [id number]  Patient presents for Illness F/U  patient here to followup acute polynephritis. She's here with her husband she is much improved today. She started to regain some strength but she still feels weak. She's not had any fever no vomiting but is still nauseous which resolved with Zofran. She is taking her antibiotics as prescribed. Her appetite is still poor. She had mild renal insufficiency and evidence of some mild dehydration on her labs her white blood cell count was also elevated to 20.1, her left flank pain is also improved.   Review Of Systems:  GEN- + fatigue, fever, weight loss,weakness, recent illness HEENT- denies eye drainage, change in vision, nasal discharge, CVS- denies chest pain, palpitations RESP- denies SOB, cough, wheeze ABD- + N/ denies V, change in stools, abd pain GU- +dysuria, denies  hematuria, dribbling, incontinence Neuro- denies headache, dizziness, syncope, seizure activity       Objective:    BP 122/68  Pulse 64  Temp(Src) 98.1 F (36.7 C) (Oral)  Resp 14  Ht 5\' 3"  (1.6 m)  Wt 152 lb (68.947 kg)  BMI 26.93 kg/m2 GEN- NAD, alert and oriented x3, non toxic appearing, mild dementia HEENT- PERRL, EOMI, non injected sclera, pink conjunctiva, MMM, oropharynx clear CVS- RRR, no murmur RESP-CTAB ABD-NABS,soft,ND,, + mild CVA tenderness left side EXT- No edema Pulses- Radial 2+       Assessment & Plan:      Problem List Items Addressed This Visit   Acute renal failure   Acute pyelonephritis - Primary   Relevant Orders      CBC w/MCH & 3 Part Diff (Completed)      Basic metabolic panel      Note: This dictation was prepared with Dragon dictation along with smaller phrase technology. Any transcriptional errors that result from this process are unintentional.

## 2014-01-02 LAB — BASIC METABOLIC PANEL
BUN: 19 mg/dL (ref 6–23)
CO2: 25 mEq/L (ref 19–32)
Calcium: 8.8 mg/dL (ref 8.4–10.5)
Chloride: 99 mEq/L (ref 96–112)
Creat: 1.22 mg/dL — ABNORMAL HIGH (ref 0.50–1.10)
Glucose, Bld: 160 mg/dL — ABNORMAL HIGH (ref 70–99)
Potassium: 3.8 mEq/L (ref 3.5–5.3)
Sodium: 136 mEq/L (ref 135–145)

## 2014-01-04 ENCOUNTER — Telehealth: Payer: Self-pay | Admitting: Family Medicine

## 2014-01-04 ENCOUNTER — Other Ambulatory Visit: Payer: Self-pay | Admitting: Family Medicine

## 2014-01-04 NOTE — Telephone Encounter (Signed)
noted 

## 2014-01-04 NOTE — Telephone Encounter (Signed)
PT husband is wanting to speak to you about dropping the furosemide (LASIX) 20 MG tablet   [phone]

## 2014-01-04 NOTE — Telephone Encounter (Signed)
Returned call to patient husband, Jonny RuizJohn.   Reports that he is going to stop lasix for 3 days while increasing her fluids to help with the dehydration.   MD to be made aware.

## 2014-01-06 ENCOUNTER — Ambulatory Visit (INDEPENDENT_AMBULATORY_CARE_PROVIDER_SITE_OTHER): Payer: Medicare Other | Admitting: *Deleted

## 2014-01-06 DIAGNOSIS — I10 Essential (primary) hypertension: Secondary | ICD-10-CM

## 2014-01-06 DIAGNOSIS — I5032 Chronic diastolic (congestive) heart failure: Secondary | ICD-10-CM

## 2014-01-07 ENCOUNTER — Other Ambulatory Visit: Payer: Self-pay

## 2014-01-07 LAB — BASIC METABOLIC PANEL
BUN/Creatinine Ratio: 12 (ref 11–26)
BUN: 11 mg/dL (ref 8–27)
CO2: 25 mmol/L (ref 18–29)
Calcium: 9.8 mg/dL (ref 8.7–10.3)
Chloride: 99 mmol/L (ref 97–108)
Creatinine, Ser: 0.91 mg/dL (ref 0.57–1.00)
GFR calc Af Amer: 68 mL/min/{1.73_m2} (ref >59)
GFR calc non Af Amer: 59 mL/min/{1.73_m2} — ABNORMAL LOW (ref >59)
Glucose: 122 mg/dL — ABNORMAL HIGH (ref 65–99)
Potassium: 4.4 mmol/L (ref 3.5–5.2)
Sodium: 142 mmol/L (ref 134–144)

## 2014-01-07 MED ORDER — ROSUVASTATIN CALCIUM 20 MG PO TABS: 20.0000 mg | ORAL_TABLET | Freq: Every day | ORAL | Status: DC

## 2014-01-14 ENCOUNTER — Telehealth: Payer: Self-pay

## 2014-01-14 NOTE — Telephone Encounter (Signed)
Pt would like lab results.  

## 2014-01-15 ENCOUNTER — Encounter (HOSPITAL_COMMUNITY): Payer: Self-pay | Admitting: Emergency Medicine

## 2014-01-15 ENCOUNTER — Emergency Department (HOSPITAL_COMMUNITY): Payer: Medicare Other

## 2014-01-15 ENCOUNTER — Emergency Department (HOSPITAL_COMMUNITY)
Admission: EM | Admit: 2014-01-15 | Discharge: 2014-01-15 | Disposition: A | Discharge status: Home / Self Care | Payer: Medicare Other | Attending: Emergency Medicine | Admitting: Emergency Medicine

## 2014-01-15 ENCOUNTER — Ambulatory Visit (INDEPENDENT_AMBULATORY_CARE_PROVIDER_SITE_OTHER): Payer: Medicare Other | Admitting: Family Medicine

## 2014-01-15 DIAGNOSIS — Z8659 Personal history of other mental and behavioral disorders: Secondary | ICD-10-CM | POA: Insufficient documentation

## 2014-01-15 DIAGNOSIS — R1013 Epigastric pain: Secondary | ICD-10-CM | POA: Insufficient documentation

## 2014-01-15 DIAGNOSIS — R5383 Other fatigue: Secondary | ICD-10-CM

## 2014-01-15 DIAGNOSIS — Z9089 Acquired absence of other organs: Secondary | ICD-10-CM | POA: Diagnosis not present

## 2014-01-15 DIAGNOSIS — Z792 Long term (current) use of antibiotics: Secondary | ICD-10-CM | POA: Insufficient documentation

## 2014-01-15 DIAGNOSIS — R0602 Shortness of breath: Secondary | ICD-10-CM

## 2014-01-15 DIAGNOSIS — I2510 Atherosclerotic heart disease of native coronary artery without angina pectoris: Secondary | ICD-10-CM | POA: Insufficient documentation

## 2014-01-15 DIAGNOSIS — Z8742 Personal history of other diseases of the female genital tract: Secondary | ICD-10-CM | POA: Diagnosis not present

## 2014-01-15 DIAGNOSIS — Z7982 Long term (current) use of aspirin: Secondary | ICD-10-CM | POA: Insufficient documentation

## 2014-01-15 DIAGNOSIS — E785 Hyperlipidemia, unspecified: Secondary | ICD-10-CM | POA: Diagnosis not present

## 2014-01-15 DIAGNOSIS — J45901 Unspecified asthma with (acute) exacerbation: Secondary | ICD-10-CM | POA: Insufficient documentation

## 2014-01-15 DIAGNOSIS — I10 Essential (primary) hypertension: Secondary | ICD-10-CM | POA: Insufficient documentation

## 2014-01-15 DIAGNOSIS — R531 Weakness: Secondary | ICD-10-CM

## 2014-01-15 DIAGNOSIS — Z8619 Personal history of other infectious and parasitic diseases: Secondary | ICD-10-CM | POA: Diagnosis not present

## 2014-01-15 DIAGNOSIS — Z951 Presence of aortocoronary bypass graft: Secondary | ICD-10-CM | POA: Insufficient documentation

## 2014-01-15 DIAGNOSIS — R5381 Other malaise: Secondary | ICD-10-CM | POA: Diagnosis not present

## 2014-01-15 DIAGNOSIS — R630 Anorexia: Secondary | ICD-10-CM | POA: Insufficient documentation

## 2014-01-15 DIAGNOSIS — K219 Gastro-esophageal reflux disease without esophagitis: Secondary | ICD-10-CM | POA: Insufficient documentation

## 2014-01-15 DIAGNOSIS — I509 Heart failure, unspecified: Secondary | ICD-10-CM | POA: Insufficient documentation

## 2014-01-15 DIAGNOSIS — Z9889 Other specified postprocedural states: Secondary | ICD-10-CM | POA: Insufficient documentation

## 2014-01-15 DIAGNOSIS — R609 Edema, unspecified: Secondary | ICD-10-CM | POA: Insufficient documentation

## 2014-01-15 DIAGNOSIS — Z8673 Personal history of transient ischemic attack (TIA), and cerebral infarction without residual deficits: Secondary | ICD-10-CM | POA: Diagnosis not present

## 2014-01-15 LAB — CBC WITH DIFFERENTIAL/PLATELET
Basophils Absolute: 0 10*3/uL (ref 0.0–0.1)
Basophils Relative: 1 % (ref 0–1)
Eosinophils Absolute: 0.1 10*3/uL (ref 0.0–0.7)
Eosinophils Relative: 2 % (ref 0–5)
HCT: 34.4 % — ABNORMAL LOW (ref 36.0–46.0)
Hemoglobin: 11.2 g/dL — ABNORMAL LOW (ref 12.0–15.0)
Lymphocytes Relative: 41 % (ref 12–46)
Lymphs Abs: 3.1 10*3/uL (ref 0.7–4.0)
MCH: 29.1 pg (ref 26.0–34.0)
MCHC: 32.6 g/dL (ref 30.0–36.0)
MCV: 89.4 fL (ref 78.0–100.0)
Monocytes Absolute: 0.6 10*3/uL (ref 0.1–1.0)
Monocytes Relative: 8 % (ref 3–12)
Neutro Abs: 3.7 10*3/uL (ref 1.7–7.7)
Neutrophils Relative %: 48 % (ref 43–77)
Platelets: 258 10*3/uL (ref 150–400)
RBC: 3.85 MIL/uL — ABNORMAL LOW (ref 3.87–5.11)
RDW: 13.4 % (ref 11.5–15.5)
WBC: 7.5 10*3/uL (ref 4.0–10.5)

## 2014-01-15 LAB — PROTIME-INR
INR: 0.96 (ref 0.00–1.49)
Prothrombin Time: 12.8 seconds (ref 11.6–15.2)

## 2014-01-15 LAB — URINALYSIS, ROUTINE W REFLEX MICROSCOPIC
Bilirubin Urine: NEGATIVE
Glucose, UA: NEGATIVE mg/dL
Hgb urine dipstick: NEGATIVE
Ketones, ur: NEGATIVE mg/dL
Leukocytes, UA: NEGATIVE
Nitrite: NEGATIVE
Protein, ur: NEGATIVE mg/dL
Specific Gravity, Urine: 1.021 (ref 1.005–1.030)
Urobilinogen, UA: 0.2 mg/dL (ref 0.0–1.0)
pH: 6 (ref 5.0–8.0)

## 2014-01-15 LAB — BASIC METABOLIC PANEL
Anion gap: 15 (ref 5–15)
BUN: 15 mg/dL (ref 6–23)
CO2: 23 mEq/L (ref 19–32)
Calcium: 9.4 mg/dL (ref 8.4–10.5)
Chloride: 100 mEq/L (ref 96–112)
Creatinine, Ser: 0.83 mg/dL (ref 0.50–1.10)
GFR calc Af Amer: 74 mL/min — ABNORMAL LOW (ref >90)
GFR calc non Af Amer: 64 mL/min — ABNORMAL LOW (ref >90)
Glucose, Bld: 140 mg/dL — ABNORMAL HIGH (ref 70–99)
Potassium: 4 mEq/L (ref 3.7–5.3)
Sodium: 138 mEq/L (ref 137–147)

## 2014-01-15 LAB — I-STAT TROPONIN, ED: Troponin i, poc: 0 ng/mL (ref 0.00–0.08)

## 2014-01-15 LAB — PRO B NATRIURETIC PEPTIDE: Pro B Natriuretic peptide (BNP): 502.7 pg/mL — ABNORMAL HIGH (ref 0–450)

## 2014-01-15 MED ORDER — ALBUTEROL SULFATE (2.5 MG/3ML) 0.083% IN NEBU
5.0000 mg | INHALATION_SOLUTION | Freq: Once | RESPIRATORY_TRACT | Status: AC
  Administered 2014-01-15: 5 mg via RESPIRATORY_TRACT
  Filled 2014-01-15: qty 6

## 2014-01-15 NOTE — ED Provider Notes (Signed)
CSN: [id number]     Arrival date & time 01/15/14  1721 History   First MD Initiated Contact with Patient 01/15/14 1808     Chief Complaint  Patient presents with  . Shortness of Breath     (Consider location/radiation/quality/duration/timing/severity/associated sxs/prior Treatment) HPI [name] is a 80 y.o. female with history of coronary disease, congestive heart failure, pleural effusions, GERD, COPD, CVA, depression, who presents to emergency department complaining of shortness of breath and weakness. Patient states that her symptoms been gone on for months but gradually worsened and today has been the worst ever. Patient lives at home with her husband. He states that she is still short of breath that she cannot walk from her bed to the bathroom. Patient denies any shortness of breath when laying down flat. She said it only worsens with movement or any type of activity. She denies any associated chest pain. She does have mild cough, but states that is chronic. She admits to intermittent swelling in the bilateral feet, but states she takes Lasix for it which helps. She reports poor appetite, generalized weakness, and change in activity. Patient states "I just don't feel like doing anything." Patient did see her cardiologist about a month ago, in [place], who told this is most likely not related to her heart. She has an appointment scheduled with pulmonologist next month.  Past Medical History  Diagnosis Date  . CAD (coronary artery disease)     s/p CABG x 5 in 1997 & CABG x 3 in 2010; Last cath in April of 2010 showed grafts to be patent. EF is 40 to 45%  . CHF (congestive heart failure)     EF is 40 to 45%  . Hyperlipemia   . Pleural effusion     post surgical  . PVD (peripheral vascular disease)     S/P right CEA  . GERD (gastroesophageal reflux disease)   . History of cerebrovascular disease   . Asthma   . Diverticulosis   . Depression     following bypass surgery  .  Hypertension   . Hepatitis A   . Sleep apnea   . Stroke 2010  . Endometriosis   . Allergy   . LBP (low back pain)   . Hiatal hernia   . Mixed incontinence urge and stress 04/22/2013  . Nocturia 04/22/2013   Past Surgical History  Procedure Laterality Date  . Coronary artery bypass graft  4098,1191    Original surgery in 1997 x 5; Redo CABG x 3 in 2010 including free RIMA to distal LAD, SVG to OM and SVG to left posterolateral  . Cardiac catheterization  April 2010    Grafts were patent. EF is 40 to 45%  . Partial colectomy      due to colon abcess  . Knee surgery      left  . Carotid endarterectomy      right  . Appendectomy     Family History  Problem Relation Age of Onset  . Heart attack Mother   . Heart attack Father   . Diabetes Mother   . Diabetes Sister   . Diabetes Maternal Grandmother   . Diabetes Paternal Grandmother   . Colon cancer Neg Hx   . Stroke Sister    History  Substance Use Topics  . Smoking status: Never Smoker   . Smokeless tobacco: Never Used  . Alcohol Use: No   OB History   Grav Para Term Preterm Abortions TAB SAB  Ect Mult Living                 Review of Systems  Constitutional: Positive for activity change, appetite change and fatigue. Negative for fever and chills.  Respiratory: Positive for cough and shortness of breath. Negative for chest tightness and wheezing.   Cardiovascular: Positive for leg swelling. Negative for chest pain and palpitations.  Gastrointestinal: Negative for nausea, vomiting, abdominal pain and diarrhea.  Genitourinary: Negative for dysuria, flank pain, vaginal bleeding, vaginal discharge, vaginal pain and pelvic pain.  Musculoskeletal: Negative for arthralgias, joint swelling, myalgias, neck pain and neck stiffness.  Skin: Negative for rash.  Neurological: Positive for weakness. Negative for dizziness and headaches.  All other systems reviewed and are negative.     Allergies  Prochlorperazine edisylate;  Metoprolol; Phenergan; Sulfonamide derivatives; Zetia; and Niacin  Home Medications   Prior to Admission medications   Medication Sig Start Date End Date Taking? Authorizing Provider  aspirin EC 81 MG tablet Take 1 tablet (81 mg total) by mouth daily. 12/15/13   Vesta Mixer, MD  ciprofloxacin (CIPRO) 500 MG tablet Take 1 tablet (500 mg total) by mouth 2 (two) times daily. 12/30/13   Salley Scarlet, MD  esomeprazole (NEXIUM) 40 MG capsule Take 40 mg by mouth daily at 12 noon.    Historical Provider, MD  furosemide (LASIX) 20 MG tablet Take 1 tablet (20 mg total) by mouth daily. 12/15/13   Vesta Mixer, MD  isosorbide mononitrate (IMDUR) 60 MG 24 hr tablet Take 1 tablet (60 mg total) by mouth daily. 03/26/13   Vesta Mixer, MD  nebivolol (BYSTOLIC) 10 MG tablet Take 1 tablet (10 mg total) by mouth daily. 07/22/13   Vesta Mixer, MD  nitroGLYCERIN (NITROSTAT) 0.4 MG SL tablet Place 1 tablet (0.4 mg total) under the tongue every 5 (five) minutes as needed. 03/29/11   Vesta Mixer, MD  NON FORMULARY Prevagen memory medication    Historical Provider, MD  ondansetron (ZOFRAN) 4 MG tablet Take 1 tablet (4 mg total) by mouth every 8 (eight) hours as needed for nausea or vomiting. 12/30/13   Salley Scarlet, MD  oxyCODONE-acetaminophen (ROXICET) 5-325 MG per tablet Take 1 tablet by mouth every 8 (eight) hours as needed for severe pain. 12/30/13   Salley Scarlet, MD  potassium chloride (K-DUR) 10 MEQ tablet Take 1 tablet (10 mEq total) by mouth daily. 12/15/13   Vesta Mixer, MD  rosuvastatin (CRESTOR) 20 MG tablet Take 1 tablet (20 mg total) by mouth daily. 01/07/14   Wendall Stade, MD   BP 139/60  Pulse 89  Temp(Src) 97.3 F (36.3 C)  Resp 16  SpO2 95% Physical Exam  Nursing note and vitals reviewed. Constitutional: She is oriented to person, place, and time. She appears well-developed and well-nourished. No distress.  HENT:  Head: Normocephalic.  Mouth/Throat: Oropharynx is clear  and moist.  Eyes: Conjunctivae are normal.  Neck: Neck supple.  Cardiovascular: Normal rate, regular rhythm and normal heart sounds.   Pulmonary/Chest: Effort normal and breath sounds normal. No respiratory distress. She has no wheezes. She has no rales.  Abdominal: Soft. Bowel sounds are normal. She exhibits no distension. There is no tenderness. There is no rebound.  Mild epigastric tenderness.  Musculoskeletal: She exhibits no edema.  Dorsal pedal pulses intact bilaterally.  Neurological: She is alert and oriented to person, place, and time.  Skin: Skin is warm and dry.  Psychiatric: She has a normal  mood and affect. Her behavior is normal.    ED Course  Procedures (including critical care time) Labs Review Labs Reviewed  BASIC METABOLIC PANEL - Abnormal; Notable for the following:    Glucose, Bld 140 (*)    GFR calc non Af Amer 64 (*)    GFR calc Af Amer 74 (*)    All other components within normal limits  PRO B NATRIURETIC PEPTIDE - Abnormal; Notable for the following:    Pro B Natriuretic peptide (BNP) 502.7 (*)    All other components within normal limits  CBC WITH DIFFERENTIAL - Abnormal; Notable for the following:    RBC 3.85 (*)    Hemoglobin 11.2 (*)    HCT 34.4 (*)    All other components within normal limits  PROTIME-INR  URINALYSIS, ROUTINE W REFLEX MICROSCOPIC  CBC  I-STAT TROPOININ, ED    Imaging Review Dg Chest 2 View  01/15/2014   CLINICAL DATA:  80 year old female with shortness of Breath. Initial encounter.  EXAM: CHEST  2 VIEW  COMPARISON:  CT Abdomen and Pelvis 10/20/2013. Chest radiographs 05/24/2009 and earlier.  FINDINGS: Sequelae of CABG and mid thoracic kyphoplasty or vertebroplasty. Stable cardiomegaly and mediastinal contours. No pneumothorax. No pulmonary edema or pleural effusion. No consolidation or acute pulmonary opacity identified. Visualized tracheal air column is within normal limits. Calcified atherosclerosis of the aorta. Osteopenia. No  acute osseous abnormality identified. Calcified carotid atherosclerosis in the right neck.  IMPRESSION: No acute cardiopulmonary abnormality.   Electronically Signed   By: Augusto GambleLee  Hall M.D.   On: 01/15/2014 19:00     Date: 01/16/2014  Rate: 89  Rhythm: normal sinus rhythm  QRS Axis: left  Intervals: normal  ST/T Wave abnormalities: normal  Conduction Disutrbances:nonspecific intraventricular conduction delay  Narrative Interpretation:   Old EKG Reviewed: unchanged    MDM   Final diagnoses:  Weakness  Shortness of breath   Patient with progressive worsening and shortness of breath over last few months. Currently unable to walk few steps from her bed to the bathroom without getting short of breath. She denies any chest pain. She does have history of coronary disease, CHF, GERD, COPD. On exam lungs are clear bilaterally. She does not appear to have fluid overloaded. She is currently chest pain-free. Patient also appears to be very depressed, weak, with loss of energy, loss of interest in doing things, poor appetite. Husband is very concerned about her declining health. We will get labs, chest x-ray, urinalysis, will monitor.   Patient's chest x-ray is negative, lab work unremarkable. Urinalysis negative. Patient appears to be tearful, reports loss of appetite and generalized weakness and no interest in everyday activities. This is suggestive of possible depression. Discussed with Dr. Fonnie JarvisBednar who has seen patient as well. At this time no indication for inpatient admission. Her vital signs are normal, she's not hypoxic, blood pressure and heart rate are normal as well. She's afebrile. She does have an appointment with pulmonologist next week. We'll discharge her home, with close outpatient followup. She's instructed to return if her symptoms are worsening.  Filed Vitals:   01/15/14 2000 01/15/14 2015 01/15/14 2030 01/15/14 2045  BP: 122/46 143/63 150/66 136/62  Pulse: 85 79 80 79  Temp:       Resp:    19  SpO2: 95% 98% 97% 95%     Deleon Passe A Mikia Delaluz, PA-C 01/16/14 0020

## 2014-01-15 NOTE — ED Notes (Signed)
Per pt and family she has been having episdoes of SOB for a while. Per husband pt has been gettnig worse. sts episodes where she cant cath her breathe. Denies chest pain.

## 2014-01-15 NOTE — ED Provider Notes (Signed)
Medical screening examination/treatment/procedure(s) were conducted as a shared visit with non-physician practitioner(s) and myself.  I personally evaluated the patient during the encounter.   EKG Interpretation None      Muse not working: Normal sinus rhythm, ventricular rate 89, left axis deviation, nonspecific intraventricular conduction delay, no significant change noted compared with prior ECG  Chronic generalized weakness chronic shortness of breath gradual onset over several months doubt ACS. Patient denies suicidal ideation but has chronic anxiety.  Hurman HornJohn M Tyhir Schwan, MD 01/26/14 2023

## 2014-01-15 NOTE — Progress Notes (Signed)
Patient ID: [name], female   DOB: 11-04-1928, 80 y.o.   MRN: [id number] A. she walked in at the end of clinic with her husband. States that she been short of breath which was worsening over the past few days. She was out to lunch with her) when she noticed her breathing worse. She denies any cough any wheezing she denies any chest pain but states that last time she felt short of breath it was her heart. She does have a history of mild asthma as well as coronary artery disease. Her husband states that he tried to make an appointment with the pulmonologist.   Her oxygen sat was 98% in the office respiratory rate 24   Gen- Mild distress, crying    CVS-RRR, no murmur , HR 90  RESP- Clear , no wheeze, no rhonchi, moving good air   Ext- No edema   Because of the shortness of breath but I think this warrants emergency room workup. I discussed with the husband is going to call EMS however he declines that he would just take her. We called [facility] to look into that she would be in refer her shortness of breath.

## 2014-01-15 NOTE — ED Notes (Signed)
Pt to xray

## 2014-01-15 NOTE — Discharge Instructions (Signed)
Your lab work and chest x-ray are good today. Make sure to get plenty of rest. Eat well balanced diet. Make sure to stay hydrated. Follow up with primary care doctor and pulmonologist as scheduled.    Weakness Weakness is a lack of strength. It may be felt all over the body (generalized) or in one specific part of the body (focal). Some causes of weakness can be serious. You may need further medical evaluation, especially if you are elderly or you have a history of immunosuppression (such as chemotherapy or HIV), kidney disease, heart disease, or diabetes. CAUSES  Weakness can be caused by many different things, including:  Infection.  Physical exhaustion.  Internal bleeding or other blood loss that results in a lack of red blood cells (anemia).  Dehydration. This cause is more common in elderly people.  Side effects or electrolyte abnormalities from medicines, such as pain medicines or sedatives.  Emotional distress, anxiety, or depression.  Circulation problems, especially severe peripheral arterial disease.  Heart disease, such as rapid atrial fibrillation, bradycardia, or heart failure.  Nervous system disorders, such as Guillain-Barr syndrome, multiple sclerosis, or stroke. DIAGNOSIS  To find the cause of your weakness, your caregiver will take your history and perform a physical exam. Lab tests or X-rays may also be ordered, if needed. TREATMENT  Treatment of weakness depends on the cause of your symptoms and can vary greatly. HOME CARE INSTRUCTIONS   Rest as needed.  Eat a well-balanced diet.  Try to get some exercise every day.  Only take over-the-counter or prescription medicines as directed by your caregiver. SEEK MEDICAL CARE IF:   Your weakness seems to be getting worse or spreads to other parts of your body.  You develop new aches or pains. SEEK IMMEDIATE MEDICAL CARE IF:   You cannot perform your normal daily activities, such as getting dressed and feeding  yourself.  You cannot walk up and down stairs, or you feel exhausted when you do so.  You have shortness of breath or chest pain.  You have difficulty moving parts of your body.  You have weakness in only one area of the body or on only one side of the body.  You have a fever.  You have trouble speaking or swallowing.  You cannot control your bladder or bowel movements.  You have black or bloody vomit or stools. MAKE SURE YOU:  Understand these instructions.  Will watch your condition.  Will get help right away if you are not doing well or get worse. Document Released: 05/14/2005 Document Revised: 11/13/2011 Document Reviewed: 07/13/2011 [facility] Patient Information 2015 [employer], [place]. This information is not intended to replace advice given to you by your health care provider. Make sure you discuss any questions you have with your health care provider.

## 2014-01-15 NOTE — Telephone Encounter (Signed)
LVM  To give preliminary results 8/21

## 2014-01-21 ENCOUNTER — Encounter (INDEPENDENT_AMBULATORY_CARE_PROVIDER_SITE_OTHER): Payer: Self-pay

## 2014-01-21 ENCOUNTER — Ambulatory Visit (INDEPENDENT_AMBULATORY_CARE_PROVIDER_SITE_OTHER): Payer: Medicare Other | Admitting: Pulmonary Disease

## 2014-01-21 ENCOUNTER — Encounter: Payer: Self-pay | Admitting: Pulmonary Disease

## 2014-01-21 VITALS — BP 118/66 | HR 80 | Temp 98.4°F | Ht 64.0 in | Wt 148.8 lb

## 2014-01-21 DIAGNOSIS — R0600 Dyspnea, unspecified: Secondary | ICD-10-CM | POA: Insufficient documentation

## 2014-01-21 NOTE — Progress Notes (Signed)
Subjective:    Patient ID: [name], female    DOB: January 08, 1929, 80 y.o.   MRN: [id number]  HPI  PCP - [place]  80 year old never smoker, retired Runner, broadcasting/film/video presents for evaluation of dyspnea. She reports the symptoms ongoing for 3-5 years but worse over the past 3 months. This is worsened by heat and is now class III, brought on by simple exertion, such as walking to the bathroom or making her bed. She sees cardiology Theatre manager) for CAD. She underwent CABG twice last in 2010. She was noted to have low EF but echo in 10/2013 showed normal LV function. RVSP was noted to be slight high but not quantitated. She is maintained on Lasix and denies pedal edema or orthopnea. TSH was normal 09/2013. She states that her husband feels that it's anxiety. She joined a [employer] but is not able to go there by herself. She had an ER visit 4 dyspnea on 01/15/14. Cardiac etiology was ruled out. Chest x-ray did not show any infiltrates or effusions. Of note CT abdomen from 10/20/13 done for renal calculus does not show any evidence of interstitial lung disease.  She did not desaturate on exertion. Spirometry did not show any evidence of airway obstruction.  Past Medical History  Diagnosis Date  . CAD (coronary artery disease)     s/p CABG x 5 in 1997 & CABG x 3 in 2010; Last cath in April of 2010 showed grafts to be patent. EF is 40 to 45%  . CHF (congestive heart failure)     EF is 40 to 45%  . Hyperlipemia   . Pleural effusion     post surgical  . PVD (peripheral vascular disease)     S/P right CEA  . GERD (gastroesophageal reflux disease)   . History of cerebrovascular disease   . Asthma   . Diverticulosis   . Depression     following bypass surgery  . Hypertension   . Hepatitis A   . Sleep apnea   . Stroke 2010  . Endometriosis   . Allergy   . LBP (low back pain)   . Hiatal hernia   . Mixed incontinence urge and stress 04/22/2013  . Nocturia 04/22/2013    Past Surgical History    Procedure Laterality Date  . Coronary artery bypass graft  1191,4782    Original surgery in 1997 x 5; Redo CABG x 3 in 2010 including free RIMA to distal LAD, SVG to OM and SVG to left posterolateral  . Cardiac catheterization  April 2010    Grafts were patent. EF is 40 to 45%  . Partial colectomy      due to colon abcess  . Knee surgery      left  . Carotid endarterectomy      right  . Appendectomy      Allergies  Allergen Reactions  . Metoprolol Other (See Comments)    allopoecia  . Phenergan [Promethazine Hcl] Other (See Comments)    Facial numbness  . Prochlorperazine Edisylate Anaphylaxis  . Zetia [Ezetimibe] Nausea Only and Other (See Comments)    Abdominal pain  . Niacin Rash  . Sulfonamide Derivatives Nausea And Vomiting    History   Social History  . Marital Status: Married    Spouse Name: N/A    Number of Children: 0  . Years of Education: N/A   Occupational History  . retired Runner, broadcasting/film/video    Social History Main Topics  . Smoking status: Never  Smoker   . Smokeless tobacco: Never Used  . Alcohol Use: No  . Drug Use: No  . Sexual Activity: No   Other Topics Concern  . Not on file   Social History Narrative  . No narrative on file    Family History  Problem Relation Age of Onset  . Heart attack Mother   . Heart attack Father   . Diabetes Mother   . Diabetes Sister   . Diabetes Maternal Grandmother   . Diabetes Paternal Grandmother   . Colon cancer Neg Hx   . Stroke Sister   . Allergies Sister   . Allergies Brother   . Allergies Father   . Heart disease Mother   . Heart disease Sister   . Rheum arthritis Mother      Review of Systems  Constitutional: Negative for fever and unexpected weight change.  HENT: Positive for sneezing and trouble swallowing. Negative for congestion, dental problem, ear pain, nosebleeds, postnasal drip, rhinorrhea, sinus pressure and sore throat.   Eyes: Negative for redness and itching.  Respiratory: Positive for  cough and shortness of breath. Negative for chest tightness and wheezing.   Cardiovascular: Negative for palpitations and leg swelling.  Gastrointestinal: Negative for nausea and vomiting.  Genitourinary: Negative for dysuria.  Musculoskeletal: Negative for joint swelling.  Skin: Negative for rash.  Neurological: Negative for headaches.  Hematological: Does not bruise/bleed easily.  Psychiatric/Behavioral: Negative for dysphoric mood. The patient is not nervous/anxious.        Objective:   Physical Exam  Gen. Pleasant, well-nourished, in no distress, normal affect ENT - no lesions, no post nasal drip Neck: No JVD, no thyromegaly, no carotid bruits Lungs: no use of accessory muscles, no dullness to percussion, clear without rales or rhonchi - reexamined after walking Cardiovascular: Rhythm regular, heart sounds  normal, no murmurs or gallops, no peripheral edema Abdomen: soft and non-tender, no hepatosplenomegaly, BS normal. Musculoskeletal: No deformities, no cyanosis or clubbing Neuro:  alert, non focal       Assessment & Plan:

## 2014-01-21 NOTE — Assessment & Plan Note (Addendum)
Etiology is not apparent - Lung function is good on spirometry Her oxygen level did not drop on walking. Although anxiety is possible, she does not report any stressors or change in social situation.she does not appear to be in overt CHF on exam or chest x-ray. No breathing medications required at this time. Since probability of venous thromboembolism is low, I will not pursue the diagnosis

## 2014-01-21 NOTE — Patient Instructions (Signed)
Lung function is good Your oxygen level did not drop on walking No breathing medications required

## 2014-01-25 ENCOUNTER — Other Ambulatory Visit: Payer: Self-pay | Admitting: Family Medicine

## 2014-01-25 NOTE — Telephone Encounter (Signed)
Refill appropriate and filled per protocol. 

## 2014-02-02 ENCOUNTER — Ambulatory Visit: Payer: Medicare Other | Admitting: Cardiovascular Disease

## 2014-02-16 ENCOUNTER — Ambulatory Visit (INDEPENDENT_AMBULATORY_CARE_PROVIDER_SITE_OTHER): Payer: Medicare Other | Admitting: Family Medicine

## 2014-02-16 DIAGNOSIS — Z23 Encounter for immunization: Secondary | ICD-10-CM

## 2014-02-22 ENCOUNTER — Institutional Professional Consult (permissible substitution): Payer: 59 | Admitting: Internal Medicine

## 2014-03-25 ENCOUNTER — Ambulatory Visit (INDEPENDENT_AMBULATORY_CARE_PROVIDER_SITE_OTHER): Payer: Medicare Other | Admitting: Cardiovascular Disease

## 2014-03-25 ENCOUNTER — Encounter: Payer: Self-pay | Admitting: Cardiovascular Disease

## 2014-03-25 VITALS — BP 106/56 | HR 68 | Ht 64.0 in | Wt 151.0 lb

## 2014-03-25 DIAGNOSIS — I25810 Atherosclerosis of coronary artery bypass graft(s) without angina pectoris: Secondary | ICD-10-CM

## 2014-03-25 DIAGNOSIS — E785 Hyperlipidemia, unspecified: Secondary | ICD-10-CM

## 2014-03-25 LAB — BASIC METABOLIC PANEL
BUN: 14 mg/dL (ref 6–23)
CO2: 24 mEq/L (ref 19–32)
Calcium: 9.3 mg/dL (ref 8.4–10.5)
Chloride: 103 mEq/L (ref 96–112)
Creatinine, Ser: 1 mg/dL (ref 0.4–1.2)
GFR: 58.97 mL/min — ABNORMAL LOW (ref >60.00)
Glucose, Bld: 99 mg/dL (ref 70–99)
Potassium: 4.1 mEq/L (ref 3.5–5.1)
Sodium: 138 mEq/L (ref 135–145)

## 2014-03-25 LAB — LIPID PANEL
Cholesterol: 161 mg/dL (ref 0–200)
HDL: 50.4 mg/dL (ref >39.00)
LDL Cholesterol: 91 mg/dL (ref 0–99)
NonHDL: 110.6
Total CHOL/HDL Ratio: 3
Triglycerides: 99 mg/dL (ref 0.0–149.0)
VLDL: 19.8 mg/dL (ref 0.0–40.0)

## 2014-03-25 LAB — HEPATIC FUNCTION PANEL
ALT: 12 U/L (ref 0–35)
AST: 19 U/L (ref 0–37)
Albumin: 3.5 g/dL (ref 3.5–5.2)
Alkaline Phosphatase: 61 U/L (ref 39–117)
Bilirubin, Direct: 0 mg/dL (ref 0.0–0.3)
Total Bilirubin: 0.6 mg/dL (ref 0.2–1.2)
Total Protein: 6.9 g/dL (ref 6.0–8.3)

## 2014-03-25 NOTE — Progress Notes (Signed)
[name] Date of Birth  06-17-1928       [facility]    [place] [address]  [address] [place], [place]  [address]   [place], [place]  [address] [phone]     [phone]   Fax  [phone]    Fax [phone]  Problem List: 1. Coronary artery disease: She status post CABG in 1997 and again in 2010. Heart catheterization in April 2010 and revealed severe native coronary artery disease. The LAD is occluded after giving off 2 large septal branches. The left circumflex artery has moderate irregularities and has competitive flow from the saphenous vein graft to the acute marginal artery. The right coronary artery is occluded. The SVG to the first obtuse marginal has minor luminal irregularities. The free RIMA originates off the hood of the SVG. It is nice graft and anastomosis to the distal LAD is normal. The SVG to right coronary artery is smooth and normal. The posterior descending artery and the posterior lateral segment artery are normal. Her ejection fraction is 40-45%. 2. Congestive heart failure-EF of 40-45% 3. Hyperlipidemia 4. Peripheral vascular disease status post right carotid endarterectomy  History of Present Illness:  [name] is an 80 y.o. female with the above noted history.   She's not had any episodes of chest pain. She does note lots of fatigue and as a result she has not been exercising much on a treadmill. She gets out of breath community slight activity.  She's not had any syncope or presyncope.  June 09, 2012 She is doing well.  She is just getting over a bad cold.  She is not exercising much this winter.  She was walking about 45 minutes a day prior to the winter months.   She has had some muscular shoulder pain but no angina.   Sept. 14, 2015:  [name] is doing well.  No problems.  No angina.   She is enrolled in [employer] but doesn't go often.  She sits quite a bit during the day. She has some dyspena -  especially with exertion.   Nov. 10, 2014  She is doing well.  She walking and exercising .  No significant CP.  She had an abnormal myoview and subsequent cath revealed an occluded SVG to OM.    Left mainstem: There is eccentric 40-50% narrowing of the left main. The left main is relatively short.  Left anterior descending (LAD): The LAD is diffusely diseased in the proximal vessel and is occluded after the second septal perforator.  Left circumflex (LCx): The left circumflex is a large vessel that continues in the AV groove into a posterior lateral branch. The first obtuse marginal vessel is occluded. There are left to left collaterals to the OM.  Right coronary artery (RCA): The right coronary is occluded proximally.  The free RIMA graft to the LAD is widely patent with good runoff. There is mild angulation of this graft at the anastomosis with the vein graft hood.  The saphenous vein graft to the first OM is occluded proximally.  The saphenous vein graft to the right coronary is widely patent with good runoff.  Left ventriculography: Left ventricular systolic function is normal, LVEF is estimated at 55-65%, there is no significant mitral regurgitation  Final Conclusions:  1. 3 vessel occlusive coronary disease.  2. Patent free RIMA graft to the LAD  3. Patent SVG to the RCA.  4. Occluded saphenous vein graft to  the OM1.  5. Normal LV function.  Recommendations: Recommend medical management.  She seems to be doing well.  August 31, 2013:  [name] is about the same.   She remains short of breath.  Not exercising.  No angina  December 15, 2013:  [name] presents today with various complaints.  She has severe shortness breath with very little exertion. When she walks from again to the bathroom she gets very short of breath. He has a generalized lack of energy. Her appetite is poor.  No angina .  She has PND and 2 pillow orthopnea.    Jan echocardiogram in June, 2015 which was essentially  normal. Chest normal left ventricle systolic function and no significant valvular abnormalities. She had a TSH in May, 2015 which was normal. Her medical doctor tried he on Lasix and flomax    She did not notice any difference in her breathing with the lasix - it just made her go to the bathroom more.   Oct. 29, 2015:  [name] is doing well. Still has her usual dyspnea.    Current Outpatient Prescriptions on File Prior to Visit  Medication Sig Dispense Refill  . aspirin EC 81 MG tablet Take 1 tablet (81 mg total) by mouth daily.  90 tablet  3  . esomeprazole (NEXIUM) 40 MG capsule Take 40 mg by mouth daily at 12 noon.      . furosemide (LASIX) 20 MG tablet Take 1 tablet (20 mg total) by mouth daily.  90 tablet  3  . isosorbide mononitrate (IMDUR) 60 MG 24 hr tablet Take 1 tablet (60 mg total) by mouth daily.  90 tablet  3  . nebivolol (BYSTOLIC) 10 MG tablet Take 1 tablet (10 mg total) by mouth daily.  30 tablet  11  . nitroGLYCERIN (NITROSTAT) 0.4 MG SL tablet Place 1 tablet (0.4 mg total) under the tongue every 5 (five) minutes as needed.  90 tablet  1  . NON FORMULARY Take 1 tablet by mouth daily. Prevagen memory medication      . potassium chloride (K-DUR) 10 MEQ tablet Take 1 tablet (10 mEq total) by mouth daily.  90 tablet  3  . Probiotic Product (PROBIOTIC DAILY) CAPS Take 1 capsule by mouth daily.      . rosuvastatin (CRESTOR) 20 MG tablet Take 1 tablet (20 mg total) by mouth daily.  30 tablet  6  . tamsulosin (FLOMAX) 0.4 MG CAPS capsule TAKE 1 CAPSULE (0.4 MG TOTAL) BY MOUTH DAILY AFTER SUPPER.  30 capsule  2   No current facility-administered medications on file prior to visit.    Allergies  Allergen Reactions  . Metoprolol Other (See Comments)    allopoecia  . Phenergan [Promethazine Hcl] Other (See Comments)    Facial numbness  . Prochlorperazine Edisylate Anaphylaxis  . Zetia [Ezetimibe] Nausea Only and Other (See Comments)    Abdominal pain  . Niacin Rash  . Sulfonamide  Derivatives Nausea And Vomiting    Past Medical History  Diagnosis Date  . CAD (coronary artery disease)     s/p CABG x 5 in 1997 & CABG x 3 in 2010; Last cath in April of 2010 showed grafts to be patent. EF is 40 to 45%  . CHF (congestive heart failure)     EF is 40 to 45%  . Hyperlipemia   . Pleural effusion     post surgical  . PVD (peripheral vascular disease)     S/P right CEA  . GERD (  gastroesophageal reflux disease)   . History of cerebrovascular disease   . Asthma   . Diverticulosis   . Depression     following bypass surgery  . Hypertension   . Hepatitis A   . Sleep apnea   . Stroke 2010  . Endometriosis   . Allergy   . LBP (low back pain)   . Hiatal hernia   . Mixed incontinence urge and stress 04/22/2013  . Nocturia 04/22/2013    Past Surgical History  Procedure Laterality Date  . Coronary artery bypass graft  [id number],2010    Original surgery in 1997 x 5; Redo CABG x 3 in 2010 including free RIMA to distal LAD, SVG to OM and SVG to left posterolateral  . Cardiac catheterization  April 2010    Grafts were patent. EF is 40 to 45%  . Partial colectomy      due to colon abcess  . Knee surgery      left  . Carotid endarterectomy      right  . Appendectomy      History  Smoking status  . Never Smoker   Smokeless tobacco  . Never Used    History  Alcohol Use No    Family History  Problem Relation Age of Onset  . Heart attack Mother   . Heart attack Father   . Diabetes Mother   . Diabetes Sister   . Diabetes Maternal Grandmother   . Diabetes Paternal Grandmother   . Colon cancer Neg Hx   . Stroke Sister   . Allergies Sister   . Allergies Brother   . Allergies Father   . Heart disease Mother   . Heart disease Sister   . Rheum arthritis Mother     Reviw of Systems:  Reviewed in the HPI.  All other systems are negative.  Physical Exam: Blood pressure 106/56, pulse 68, height 5\' 4"  (1.626 m), weight 151 lb (68.493 kg). General: Well  developed, well nourished, in no acute distress. Head: Normocephalic, atraumatic, sclera non-icteric, mucus membranes are moist,  Neck: Supple. Carotids are 2 + without bruits. No JVD.  She has a right carotid endarterectomy scar. Lungs: Clear bilaterally to auscultation. Heart: regular rate.  normal  S1 S2. No murmurs, gallops or rubs. Abdomen: Soft, non-tender, non-distended with normal bowel sounds. No hepatomegaly. No rebound/guarding. No masses. Msk:  Strength and tone are normal Extremities: No clubbing or cyanosis. No edema.  Distal pedal pulses are 2+ and equal bilaterally. Neuro: Alert and oriented X 3. Moves all extremities spontaneously. Psych:  Responds to questions appropriately with a normal affect.  ECG:  Assessment / Plan:

## 2014-03-25 NOTE — Assessment & Plan Note (Signed)
She is doing well. No angina.  She has chronic dyspnea which is unchanged. Will check fasting labs this am and I will see her in 6 months.

## 2014-03-25 NOTE — Patient Instructions (Signed)
Your physician recommends that you have lab work: TODAY - liver, cholesterol, basic metabolic panel  Your physician recommends that you continue on your current medications as directed. Please refer to the Current Medication list given to you today.  Your physician wants you to follow-up in: 6 months with Dr. Elease HashimotoNahser. You will receive a reminder letter in the mail two months in advance. If you don't receive a letter, please call our office to schedule the follow-up appointment.

## 2014-03-26 ENCOUNTER — Ambulatory Visit: Payer: Medicare Other | Admitting: Cardiovascular Disease

## 2014-03-30 ENCOUNTER — Telehealth: Payer: Self-pay | Admitting: Cardiovascular Disease

## 2014-03-30 NOTE — Telephone Encounter (Signed)
-----   Message from Judy PimpleLinda M Reiland, LPN sent at 16/1/096011/07/2013  1:05 PM EST -----   ----- Message -----    From: Vesta MixerPhilip J Nahser, MD    Sent: 03/26/2014   9:25 AM      To: Henrietta DineKathryn A Kemp, RN  Labs look ok

## 2014-03-30 NOTE — Telephone Encounter (Signed)
Advised patient of lab results  

## 2014-03-30 NOTE — Telephone Encounter (Signed)
New message ° ° ° ° °Want lab results °

## 2014-04-08 ENCOUNTER — Other Ambulatory Visit: Payer: Self-pay | Admitting: Gastroenterology

## 2014-04-30 ENCOUNTER — Telehealth: Payer: Self-pay | Admitting: Family Medicine

## 2014-04-30 NOTE — Telephone Encounter (Signed)
Patients wife Jonny Ruizjohn is calling to discuss Ltanya's medications and stop taking some of them  [phone]

## 2014-05-03 NOTE — Telephone Encounter (Signed)
FYI to let you know that pt stopped taking the Furosemide and Flomax as it was keeping her up all night going to the bathroom.

## 2014-05-04 ENCOUNTER — Other Ambulatory Visit: Payer: Self-pay | Admitting: Family Medicine

## 2014-05-04 NOTE — Telephone Encounter (Signed)
Patient husband contacted. Reports that patient is no longer taking Flomax.   Refill denied.

## 2014-05-06 ENCOUNTER — Encounter (HOSPITAL_COMMUNITY): Payer: Self-pay | Admitting: Cardiology

## 2014-05-06 ENCOUNTER — Other Ambulatory Visit: Payer: Self-pay | Admitting: *Deleted

## 2014-05-06 MED ORDER — ISOSORBIDE MONONITRATE ER 60 MG PO TB24: 60.0000 mg | ORAL_TABLET | Freq: Every day | ORAL | Status: DC

## 2014-05-17 ENCOUNTER — Encounter: Payer: Self-pay | Admitting: Cardiology

## 2014-06-04 ENCOUNTER — Encounter: Payer: Self-pay | Admitting: Family Medicine

## 2014-07-05 ENCOUNTER — Telehealth: Payer: Self-pay | Admitting: Cardiovascular Disease

## 2014-07-05 NOTE — Telephone Encounter (Signed)
Left message to call back  

## 2014-07-05 NOTE — Telephone Encounter (Signed)
New message    Husband calling  Pt c/o Shortness Of Breath: STAT if SOB developed within the last 24 hours or pt is noticeably SOB on the phone  1. Are you currently SOB (can you hear that pt is SOB on the phone)? [facility]sand calling /   2. How long have you been experiencing SOB? For awhile now - patient states it has gotten worse  3-4 days   3. Are you SOB when sitting or when up moving around?  Moving around   4. Are you currently experiencing any other symptoms?  No    Made appt for 2/9 with Dr. Elease HashimotoNahser.

## 2014-07-06 ENCOUNTER — Encounter: Payer: Self-pay | Admitting: Cardiovascular Disease

## 2014-07-06 ENCOUNTER — Ambulatory Visit (INDEPENDENT_AMBULATORY_CARE_PROVIDER_SITE_OTHER): Payer: Medicare Other | Admitting: Cardiovascular Disease

## 2014-07-06 VITALS — BP 112/50 | HR 73 | Ht 64.0 in | Wt 154.6 lb

## 2014-07-06 DIAGNOSIS — I25810 Atherosclerosis of coronary artery bypass graft(s) without angina pectoris: Secondary | ICD-10-CM

## 2014-07-06 DIAGNOSIS — R0600 Dyspnea, unspecified: Secondary | ICD-10-CM

## 2014-07-06 LAB — BASIC METABOLIC PANEL
BUN: 14 mg/dL (ref 6–23)
CO2: 28 mEq/L (ref 19–32)
Calcium: 9.4 mg/dL (ref 8.4–10.5)
Chloride: 104 mEq/L (ref 96–112)
Creatinine, Ser: 1.03 mg/dL (ref 0.40–1.20)
GFR: 54.33 mL/min — ABNORMAL LOW (ref >60.00)
Glucose, Bld: 113 mg/dL — ABNORMAL HIGH (ref 70–99)
Potassium: 4.4 mEq/L (ref 3.5–5.1)
Sodium: 138 mEq/L (ref 135–145)

## 2014-07-06 LAB — BRAIN NATRIURETIC PEPTIDE: Pro B Natriuretic peptide (BNP): 276 pg/mL — ABNORMAL HIGH (ref 0.0–100.0)

## 2014-07-06 NOTE — Progress Notes (Signed)
Cardiology Office Note   Date:  07/06/2014   ID:  [name], MRN [id number]  PCP:  Leo GrosserPICKARD,WARREN TOM, MD  Cardiologist:   Vesta MixerNahser, Philip J, MD   Chief Complaint  Patient presents with  . Coronary Artery Disease   1. Coronary artery disease: She status post CABG in 1997 and again in 2010. Heart catheterization in April 2010 and revealed severe native coronary artery disease. The LAD is occluded after giving off 2 large septal branches. The left circumflex artery has moderate irregularities and has competitive flow from the saphenous vein graft to the acute marginal artery. The right coronary artery is occluded. The SVG to the first obtuse marginal has minor luminal irregularities. The free RIMA originates off the hood of the SVG. It is nice graft and anastomosis to the distal LAD is normal. The SVG to right coronary artery is smooth and normal. The posterior descending artery and the posterior lateral segment artery are normal. Her ejection fraction is 40-45%. 2. Congestive heart failure-EF of 55-60%  3. Hyperlipidemia 4. Peripheral vascular disease status post right carotid endarterectomy  History of Present Illness:   [name] is an 81 y.o. female with the above noted history. She's not had any episodes of chest pain. She does note lots of fatigue and as a result she has not been exercising much on a treadmill. She gets out of breath community slight activity.  She's not had any syncope or presyncope.  June 09, 2012 She is doing well. She is just getting over a bad cold. She is not exercising much this winter. She was walking about 45 minutes a day prior to the winter months. She has had some muscular shoulder pain but no angina.   Sept. 14, 2015:  [name] is doing well. No problems. No angina. She is enrolled in [employer] but doesn't go often. She sits quite a bit during the day. She has some dyspena - especially with exertion.   Nov. 10, 2014  She  is doing well. She walking and exercising . No significant CP. She had an abnormal myoview and subsequent cath revealed an occluded SVG to OM.   Feb. 9, 2016:  [name] is a 81 y.o. female who presents for follow up of her CAD She is having more shortness of breath - especially at night.  She has not been doing much exercise  Her stress myoview in July was low risk .  Is having lots of back pain this afternoon.   Past Medical History  Diagnosis Date  . CAD (coronary artery disease)     s/p CABG x 5 in 1997 & CABG x 3 in 2010; Last cath in April of 2010 showed grafts to be patent. EF is 40 to 45%  . CHF (congestive heart failure)     EF is 40 to 45%  . Hyperlipemia   . Pleural effusion     post surgical  . PVD (peripheral vascular disease)     S/P right CEA  . GERD (gastroesophageal reflux disease)   . History of cerebrovascular disease   . Asthma   . Diverticulosis   . Depression     following bypass surgery  . Hypertension   . Hepatitis A   . Sleep apnea   . Stroke 2010  . Endometriosis   . Allergy   . LBP (low back pain)   . Hiatal hernia   . Mixed incontinence urge and stress 04/22/2013  . Nocturia  04/22/2013    Past Surgical History  Procedure Laterality Date  . Coronary artery bypass graft  [id number]    Original surgery in 1997 x 5; Redo CABG x 3 in 2010 including free RIMA to distal LAD, SVG to OM and SVG to left posterolateral  . Cardiac catheterization  April 2010    Grafts were patent. EF is 40 to 45%  . Partial colectomy      due to colon abcess  . Knee surgery      left  . Carotid endarterectomy      right  . Appendectomy    . Left heart catheterization with coronary/graft angiogram N/A 03/12/2013    Procedure: LEFT HEART CATHETERIZATION WITH Isabel Caprice;  Surgeon: Peter M Swaziland, MD;  Location: [facility] CATH LAB;  Service: Cardiovascular;  Laterality: N/A;     Current Outpatient Prescriptions  Medication Sig Dispense Refill  .  aspirin EC 81 MG tablet Take 1 tablet (81 mg total) by mouth daily. 90 tablet 3  . esomeprazole (NEXIUM) 40 MG capsule Take 40 mg by mouth daily at 12 noon.    . isosorbide mononitrate (IMDUR) 60 MG 24 hr tablet Take 1 tablet (60 mg total) by mouth daily. 90 tablet 3  . nebivolol (BYSTOLIC) 10 MG tablet Take 1 tablet (10 mg total) by mouth daily. 30 tablet 11  . nitroGLYCERIN (NITROSTAT) 0.4 MG SL tablet Place 1 tablet (0.4 mg total) under the tongue every 5 (five) minutes as needed. 90 tablet 1  . potassium chloride (K-DUR) 10 MEQ tablet Take 1 tablet (10 mEq total) by mouth daily. 90 tablet 3  . rosuvastatin (CRESTOR) 20 MG tablet Take 1 tablet (20 mg total) by mouth daily. 30 tablet 6   No current facility-administered medications for this visit.    Allergies:   Metoprolol; Phenergan; Prochlorperazine edisylate; Zetia; Niacin; and Sulfonamide derivatives    Social History:  The patient  reports that she has never smoked. She has never used smokeless tobacco. She reports that she does not drink alcohol or use illicit drugs.   Family History:  The patient's family history includes Allergies in her brother, father, and sister; Diabetes in her maternal grandmother, mother, paternal grandmother, and sister; Heart attack in her father and mother; Heart disease in her mother and sister; Rheum arthritis in her mother; Stroke in her sister. There is no history of Colon cancer.    ROS:  Please see the history of present illness.    Review of Systems: Constitutional:  denies fever, chills, diaphoresis, appetite change and fatigue.  HEENT: denies photophobia, eye pain, redness, hearing loss, ear pain, congestion, sore throat, rhinorrhea, sneezing, neck pain, neck stiffness and tinnitus.  Respiratory: denies SOB, DOE, cough, chest tightness, and wheezing.  Cardiovascular: denies chest pain, palpitations and leg swelling.  Gastrointestinal: denies nausea, vomiting, abdominal pain, diarrhea,  constipation, blood in stool.  Genitourinary: denies dysuria, urgency, frequency, hematuria, flank pain and difficulty urinating.  Musculoskeletal: denies  myalgias, back pain, joint swelling, arthralgias and gait problem.   Skin: denies pallor, rash and wound.  Neurological: denies dizziness, seizures, syncope, weakness, light-headedness, numbness and headaches.   Hematological: denies adenopathy, easy bruising, personal or family bleeding history.  Psychiatric/ Behavioral: denies suicidal ideation, mood changes, confusion, nervousness, sleep disturbance and agitation.       All other systems are reviewed and negative.    PHYSICAL EXAM: VS:  BP 112/50 mmHg  Pulse 73  Ht  (1.626 m)  Wt 154 lb 9.6  oz (70.126 kg)  BMI 26.52 kg/m2 , BMI Body mass index is 26.52 kg/(m^2). GEN: Well nourished, well developed, in no acute distress HEENT: normal Neck: no JVD, carotid bruits, or masses Cardiac: RRR; no murmurs, rubs, or gallops,no edema  Respiratory:  clear to auscultation bilaterally, normal work of breathing GI: soft, nontender, nondistended, + BS MS: no deformity or atrophy Skin: warm and dry, no rash Neuro:  Strength and sensation are intact Psych: normal   EKG:  EKG is ordered today. The ekg ordered today demonstrates NSR at 73.  Occasional PVCs and PACs. , LBBB    Recent Labs: 10/13/2013: TSH 2.13 01/15/2014: Hemoglobin 11.2*; Platelets 258; Pro B Natriuretic peptide (BNP) 502.7* 03/25/2014: ALT 12; BUN 14; Creatinine 1.0; Potassium 4.1; Sodium 138    Lipid Panel    Component Value Date/Time   CHOL 161 03/25/2014 1039   TRIG 99.0 03/25/2014 1039   HDL 50.40 03/25/2014 1039   CHOLHDL 3 03/25/2014 1039   VLDL 19.8 03/25/2014 1039   LDLCALC 91 03/25/2014 1039      Wt Readings from Last 3 Encounters:  07/06/14 154 lb 9.6 oz (70.126 kg)  03/25/14 151 lb (68.493 kg)  01/21/14 148 lb 12.8 oz (67.495 kg)      Other studies Reviewed: Additional studies/ records  that were reviewed today include: . Review of the above records demonstrates:    ASSESSMENT AND PLAN:  1. Coronary artery disease:   She is doing ok.  I don't think her current symptoms are due to CAD.   I suspect she is deconditioned. I've encouraged her to get more exercise.    2. Congestive heart failure - her last echo in June shows a normal EF .   Will get a BNP today and BMP today   3. Hyperlipidemia -  Will check labs again in 6 months.   4. Peripheral vascular disease status post right carotid endarterectomy.    Current medicines are reviewed at length with the patient today.  The patient does not have concerns regarding medicines.  The following changes have been made:  no change   Disposition:   FU with me in 6 months     Signed, Nahser, Deloris Ping, MD  07/06/2014 2:48 PM    [facility] [address] [place], [place], [place]  [address] Phone: [phone]; Fax: [phone]

## 2014-07-06 NOTE — Patient Instructions (Addendum)
[facility] [place] - [phone]- [phone]- physical therapy   Your physician recommends that you continue on your current medications as directed. Please refer to the Current Medication list given to you today.  Your physician recommends that you have lab work TODAY - BMET, BNP  Your physician wants you to follow-up in: 6 months with Dr. Elease HashimotoNahser.  You will receive a reminder letter in the mail two months in advance. If you don't receive a letter, please call our office to schedule the follow-up appointment.

## 2014-07-23 ENCOUNTER — Encounter: Payer: Self-pay | Admitting: Adult Health

## 2014-07-23 ENCOUNTER — Ambulatory Visit (INDEPENDENT_AMBULATORY_CARE_PROVIDER_SITE_OTHER): Payer: Medicare Other | Admitting: Adult Health

## 2014-07-23 VITALS — BP 120/82 | HR 65 | Temp 98.1°F | Ht 64.0 in | Wt 156.1 lb

## 2014-07-23 DIAGNOSIS — R0600 Dyspnea, unspecified: Secondary | ICD-10-CM

## 2014-07-23 NOTE — Assessment & Plan Note (Addendum)
Dyspnea, questionable etiology Workup has been unrevealing  echo in June 2015 showed normal left ventricular function Stress Myoview was negative for ischemic changes Labs were unrevealing. Spirometry showed normal lung function. Chest x-ray was clear, lung bases were clear on a CT abdomen from May 2015 There was no desaturations with ambulation  Advise patient that she may need to follow-up with her primary care physician for underlying issues of memory issues and depression anxiety She will return to our office in 2 months for pulmonary function test-she would like to wait a little while before returning. Due to her husband's diagnosis of cancer.

## 2014-07-23 NOTE — Progress Notes (Signed)
Subjective:    Patient ID: [name], female    DOB: 03-22-1929, 81 y.o.   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 She reports the symptoms ongoing for 3-5 years but worse over the past 3 months. This is worsened by heat and is now class III, brought on by simple exertion, such as walking to the bathroom or making her bed. She sees cardiology Theatre manager) for CAD. She underwent CABG twice last in 2010. She was noted to have low EF but echo in 10/2013 showed normal LV function. RVSP was noted to be slight high but not quantitated. She is maintained on Lasix and denies pedal edema or orthopnea. TSH was normal 09/2013. She states that her husband feels that it's anxiety. She joined a silver sneakers but is not able to go there by herself. She had an ER visit 4 dyspnea on 01/15/14. Cardiac etiology was ruled out. Chest x-ray did not show any infiltrates or effusions. Of note CT abdomen from 10/20/13 done for renal calculus does not show any evidence of interstitial lung disease.  She did not desaturate on exertion. Spirometry did not show any evidence of airway obstruction. >no further testing   07/23/2014 Follow up : Dyspnea  Patient returns for persistent dyspnea. Patient was seen 6 months ago for ongoing dyspnea with activity. Her workup has been unrevealing. She has an extensive cardiac history with previous CABG She is followed by cardiology. Recent repeat echo in June 2015 showed normal left ventricular function Stress Myoview was negative for ischemic changes Labs were unrevealing. Spirometry showed normal lung function. Chest x-ray was clear, lung bases were clear on a CT abdomen from May 2015 There was no desaturations with ambulation Patient is a never smoker He denies cough, chest pain, orthopnea, PND, leg swelling or hemoptysis. We discussed her ongoing symptoms Patient has been recommended to  increase her activity as she is quite sedentary Patient does appear to be quite debilitated in the office. Required assistance to get up onto the exam table and has a slow shuffling gait Did discuss with her husband and patient regarding memory issues as patient did not remember coming to our office previously to be seen. She did look to her husband quite a bit for answers. She is under quite a bit of stress as her husband has recently been diagnosed with pancreatic cancer is undergoing chemotherapy. Her husband believes this is anxiety . Patient feels that she is somewhat anxious and depressed. Discussed these issues need to be followed by her primary care physician.    Review of Systems  Constitutional: Negative for fever and unexpected weight change.  HENT: . Negative for congestion, dental problem, ear pain, nosebleeds, postnasal drip, rhinorrhea, sinus pressure and sore throat.   Eyes: Negative for redness and itching.  Respiratory: Positive for shortness of breath. Negative for chest tightness and wheezing.   Cardiovascular: Negative for palpitations and leg swelling.  Gastrointestinal: Negative for nausea and vomiting.  Genitourinary: Negative for dysuria.  Musculoskeletal: Negative for joint swelling.  Skin: Negative for rash.  Neurological: Negative for headaches.  Hematological: Does not bruise/bleed easily.  Psychiatric/Behavioral: Negative for dysphoric mood. The patient is not nervous/anxious.        Objective:   Physical Exam  Gen. Pleasant,, in no distress, normal affect, elderly  ENT - no lesions, no post nasal drip Neck: No JVD, no thyromegaly, no carotid bruits Lungs: no  use of accessory muscles, no dullness to percussion, clear without rales or rhonchi - reexamined after walking Cardiovascular: Rhythm regular, heart sounds  normal, no murmurs or gallops, no peripheral edema Abdomen: soft and non-tender, no hepatosplenomegaly, BS normal. Musculoskeletal: No  deformities, no cyanosis or clubbing, slow gait Neuro:  alert, non focal       Assessment & Plan:

## 2014-07-23 NOTE — Patient Instructions (Signed)
Continue with activity as tolerated.  Follow up Dr. Vassie LollAlva  In 2 months with PFT

## 2014-08-12 ENCOUNTER — Other Ambulatory Visit: Payer: Self-pay | Admitting: Cardiovascular Disease

## 2014-08-25 ENCOUNTER — Other Ambulatory Visit: Payer: Self-pay

## 2014-08-25 MED ORDER — ROSUVASTATIN CALCIUM 20 MG PO TABS: 20.0000 mg | ORAL_TABLET | Freq: Every day | ORAL | Status: DC

## 2014-09-21 ENCOUNTER — Ambulatory Visit: Payer: Medicare Other | Admitting: Pulmonary Disease

## 2014-09-22 ENCOUNTER — Ambulatory Visit: Payer: Self-pay | Admitting: Cardiovascular Disease

## 2014-10-15 ENCOUNTER — Ambulatory Visit (INDEPENDENT_AMBULATORY_CARE_PROVIDER_SITE_OTHER): Payer: Medicare Other | Admitting: Pulmonary Disease

## 2014-10-15 ENCOUNTER — Other Ambulatory Visit: Payer: Self-pay | Admitting: Family Medicine

## 2014-10-15 ENCOUNTER — Other Ambulatory Visit: Payer: Self-pay | Admitting: Cardiovascular Disease

## 2014-10-15 ENCOUNTER — Encounter: Payer: Self-pay | Admitting: Pulmonary Disease

## 2014-10-15 ENCOUNTER — Telehealth: Payer: Self-pay | Admitting: *Deleted

## 2014-10-15 VITALS — BP 115/58 | HR 66 | Ht 64.5 in | Wt 153.4 lb

## 2014-10-15 DIAGNOSIS — R0600 Dyspnea, unspecified: Secondary | ICD-10-CM | POA: Diagnosis not present

## 2014-10-15 MED ORDER — ALPRAZOLAM 0.25 MG PO TABS: 0.2500 mg | ORAL_TABLET | Freq: Two times a day (BID) | ORAL | Status: DC | PRN

## 2014-10-15 NOTE — Assessment & Plan Note (Signed)
No pulmonary cause of dyspnea She has cards fu but I doubt cardiac etiology Trial of Xanax 0.25 mg twice daily as needed for anxiety - she is going through a rough patch with her husband's illness.  Her memory issues also perhaps need to be investigated I offered her physical therapy but she cannot do this right now.

## 2014-10-15 NOTE — Telephone Encounter (Signed)
error 

## 2014-10-15 NOTE — Progress Notes (Signed)
   Subjective:    Patient ID: [name], female    DOB: 1929/02/11, 81 y.o.   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 She reports the symptoms ongoing for 3-5 years but worse over the past 3 months. This is worsened by heat and is now class III, brought on by simple exertion, such as walking to the bathroom or making her bed. She sees cardiology Theatre manager(Nahser) for CAD. She underwent CABG twice last in 2010.  She was noted to have low EF but echo in 10/2013 showed normal LV function. RVSP was noted to be slight high but not quantitated. She is maintained on Lasix and denies pedal edema or orthopnea. TSH was normal 09/2013. She states that her husband feels that it's anxiety. She joined a silver sneakers but is not able to go there by herself.  01/15/14 ER visit >> Cardiac etiology was ruled out. Chest x-ray did not show any infiltrates or effusions.   CT abdomen from 10/20/13 done for renal calculus does not show any evidence of interstitial lung disease.  12/2013 >>Spirometry did not show any evidence of airway obstruction.   10/15/2014  Chief Complaint  Patient presents with  . Follow-up    Patient says breathing is not doing so well.  Panting a lot with the least little bit of exertion.  Patient was to have PFT today, but doesn't know how the appointment got cancelled. sometimes has chest tightness, discomfort.  Main concern today is inability to "breath".  Spouse [facility] in [facility] and she has to stop to catch a breath when walking [facility] the hallway [facility].    Husband now hosp [facility] - she gets dyspneic walking the long halls & has to sit down She has an extensive cardiac history with previous CABG She denies cough, chest pain, orthopnea, PND, leg swelling or hemoptysis. She is under quite a bit of stress as her husband was diagnosed with pancreatic cancer is undergoing chemotherapy. Her husband believes this is  anxiety . Patient feels that she is somewhat anxious and depressed.    Review of Systems neg for any significant sore throat, dysphagia, itching, sneezing, nasal congestion or excess/ purulent secretions, fever, chills, sweats, unintended wt loss, pleuritic or exertional cp, hempoptysis, orthopnea pnd or change in chronic leg swelling. Also denies presyncope, palpitations, heartburn, abdominal pain, nausea, vomiting, diarrhea or change in bowel or urinary habits, dysuria,hematuria, rash, arthralgias, visual complaints, headache, numbness weakness or ataxia.     Objective:   Physical Exam  Gen. Pleasant, well-nourished, in no distress ENT - no lesions, no post nasal drip Neck: No JVD, no thyromegaly, no carotid bruits Lungs: no use of accessory muscles, no dullness to percussion, clear without rales or rhonchi  Cardiovascular: Rhythm regular, heart sounds  normal, no murmurs or gallops, no peripheral edema Musculoskeletal: No deformities, no cyanosis or clubbing        Assessment & Plan:

## 2014-10-15 NOTE — Telephone Encounter (Signed)
Medication refill per protocol °

## 2014-10-15 NOTE — Addendum Note (Signed)
Addended by: York RamGAY, Jonathen Rathman on: 10/15/2014 10:04 AM   Modules accepted: Orders

## 2014-10-15 NOTE — Patient Instructions (Signed)
Trial of Xanax 0.25 mg twice daily as needed for anxiety

## 2014-10-26 ENCOUNTER — Ambulatory Visit (INDEPENDENT_AMBULATORY_CARE_PROVIDER_SITE_OTHER): Payer: Medicare Other | Admitting: Family Medicine

## 2014-10-26 ENCOUNTER — Encounter: Payer: Self-pay | Admitting: Family Medicine

## 2014-10-26 VITALS — BP 120/58 | HR 74 | Temp 98.5°F | Resp 16 | Wt 154.0 lb

## 2014-10-26 DIAGNOSIS — R0600 Dyspnea, unspecified: Secondary | ICD-10-CM | POA: Diagnosis not present

## 2014-10-26 LAB — PULMONARY FUNCTION TEST

## 2014-10-26 NOTE — Progress Notes (Signed)
Subjective:    Patient ID: [name], female    DOB: 10-Apr-1929, 81 y.o.   MRN: [id number]  HPI patient presents with dyspnea. Patient states that she gets easily winded. This is been going on now for quite some time. She is seeing her cardiologist and had an extensive cardiac workup. Cardiac source of dyspnea was ruled out. I reviewed her chart and she is even seen a pulmonologist. He had recommended Xanax. He also recommended physical therapy. It seems that anxiety and deconditioning or the leading causes on his differential diagnosis. Patient denies any chest pain. She denies any chest pressure. She does report easy fatigability and dyspnea on minimal exertion. I perform pulmonary function test today in office. FEV1 was 1.6 L which is 95% of predicted, FVC was 2.1 L which is 85% of predicted, an FEV1 to FVC ratio was 76%. This is essentially normal and shows no evidence of an obstructive pulmonary pathology or a restrictive pulmonary pathology.  Patient had a CBC last fall at the end of October showed a hemoglobin of 11 making anemia an unlikely cause.Marland Kitchen. Past Medical History  Diagnosis Date  . CAD (coronary artery disease)     s/p CABG x 5 in 1997 & CABG x 3 in 2010; Last cath in April of 2010 showed grafts to be patent. EF is 40 to 45%  . CHF (congestive heart failure)     EF is 40 to 45%  . Hyperlipemia   . Pleural effusion     post surgical  . PVD (peripheral vascular disease)     S/P right CEA  . GERD (gastroesophageal reflux disease)   . History of cerebrovascular disease   . Asthma   . Diverticulosis   . Depression     following bypass surgery  . Hypertension   . Hepatitis A   . Sleep apnea   . Stroke 2010  . Endometriosis   . Allergy   . LBP (low back pain)   . Hiatal hernia   . Mixed incontinence urge and stress 04/22/2013  . Nocturia 04/22/2013   Past Surgical History  Procedure Laterality Date  . Coronary artery bypass graft  4098,11911997,2010    Original surgery in 1997 x  5; Redo CABG x 3 in 2010 including free RIMA to distal LAD, SVG to OM and SVG to left posterolateral  . Cardiac catheterization  April 2010    Grafts were patent. EF is 40 to 45%  . Partial colectomy      due to colon abcess  . Knee surgery      left  . Carotid endarterectomy      right  . Appendectomy    . Left heart catheterization with coronary/graft angiogram N/A 03/12/2013    Procedure: LEFT HEART CATHETERIZATION WITH Isabel CapriceORONARY/GRAFT ANGIOGRAM;  Surgeon: Peter M SwazilandJordan, MD;  [facility];  Service: Cardiovascular;  Laterality: N/A;   Current Outpatient Prescriptions on File Prior to Visit  Medication Sig Dispense Refill  . ALPRAZolam (XANAX) 0.25 MG tablet Take 1 tablet (0.25 mg total) by mouth 2 (two) times daily as needed for anxiety. 40 tablet 0  . aspirin EC 81 MG tablet Take 1 tablet (81 mg total) by mouth daily. 90 tablet 3  . BYSTOLIC 10 MG tablet TAKE 1 TABLET BY MOUTH EVERY DAY 30 tablet 11  . esomeprazole (NEXIUM) 40 MG capsule Take 40 mg by mouth daily at 12 noon.    . isosorbide mononitrate (IMDUR) 60 MG 24  hr tablet Take 1 tablet (60 mg total) by mouth daily. 90 tablet 3  . nitroGLYCERIN (NITROSTAT) 0.4 MG SL tablet Place 1 tablet (0.4 mg total) under the tongue every 5 (five) minutes as needed. 90 tablet 1  . potassium chloride (MICRO-K) 10 MEQ CR capsule TAKE 1 TABLET BY MOUTH DAILY. 90 capsule 1  . rosuvastatin (CRESTOR) 20 MG tablet Take 1 tablet (20 mg total) by mouth daily. 30 tablet 6  . tamsulosin (FLOMAX) 0.4 MG CAPS capsule TAKE ONE CAPSULE BY MOUTH EVERY DAY (Patient not taking: Reported on 10/26/2014) 30 capsule 2   No current facility-administered medications on file prior to visit.   Allergies  Allergen Reactions  . Metoprolol Other (See Comments)    allopoecia  . Phenergan [Promethazine Hcl] Other (See Comments)    Facial numbness  . Prochlorperazine Edisylate Anaphylaxis  . Zetia [Ezetimibe] Nausea Only and Other (See Comments)    Abdominal  pain  . Niacin Rash  . Sulfonamide Derivatives Nausea And Vomiting   History   Social History  . Marital Status: Married    Spouse Name: N/A  . Number of Children: 0  . Years of Education: N/A   Occupational History  . retired Runner, broadcasting/film/video    Social History Main Topics  . Smoking status: Never Smoker   . Smokeless tobacco: Never Used  . Alcohol Use: No  . Drug Use: No  . Sexual Activity: No   Other Topics Concern  . Not on file   Social History Narrative     Review of Systems  All other systems reviewed and are negative.      Objective:   Physical Exam  Constitutional: She appears well-developed and well-nourished. No distress.  Neck: Neck supple. No JVD present.  Cardiovascular: Normal rate, regular rhythm and normal heart sounds.   Pulmonary/Chest: Effort normal and breath sounds normal. No respiratory distress. She has no wheezes. She has no rales.  Musculoskeletal: She exhibits no edema.  Lymphadenopathy:    She has no cervical adenopathy.  Skin: She is not diaphoretic.  Vitals reviewed.         Assessment & Plan:  Dyspnea  I believe the patient's dyspnea is likely related to deconditioning compounded by anxiety and early dementia rather than physiologic. I explained this is best I could to the patient and her husband today. I have recommended increasing physical activity slowly and gradually to improve her stamina. I recommended treating her anxiety/managing her anxiety as a means to help control her dyspnea.Marland Kitchen

## 2014-10-27 ENCOUNTER — Telehealth: Payer: Self-pay | Admitting: Cardiovascular Disease

## 2014-10-27 NOTE — Telephone Encounter (Signed)
I spoke with Dr. Elease HashimotoNahser who is in the office this afternoon and he advised that patient should keep appointment with Jacolyn ReedyMichele Lenze, PA for 6/7.  He advised that medical management is the best option for patient currently; states she would likely not tolerate any interventions that we could offer at this point.  I called and advised patient's husband and he verbalized understanding and agreement.  I [facility] patient's symptoms worsened prior to appointment with Jacolyn ReedyMichele Lenze.  He verbalized understanding and agreement.

## 2014-10-27 NOTE — Telephone Encounter (Signed)
New Message  Pt c/o Shortness Of Breath: STAT if SOB developed within the last 24 hours or pt is noticeably SOB on the phone  1. Are you currently SOB (can you hear that pt is SOB on the phone)? Right now the pt is sleep. Ran errands and it wiped her out  2. How long have you been experiencing SOB? For a while 3. Are you SOB when sitting or when up moving around? Moving. When she moves a lot she has to stop and sit  4.  Are you currently experiencing any other symptoms? No .  Comments: Pt husband called states that the pt has SOB took a breathing test found that it was in the norm but she has SOB with No energy. Made appt with Herma CarsonMichelle Lenze on 06/07. Pt husband req a call back to discuss if it is servere and what is needed to move forward. Should they visit the ER.

## 2014-11-02 ENCOUNTER — Encounter: Payer: Self-pay | Admitting: Physician Assistant

## 2014-11-02 ENCOUNTER — Ambulatory Visit (INDEPENDENT_AMBULATORY_CARE_PROVIDER_SITE_OTHER): Payer: Medicare Other | Admitting: Physician Assistant

## 2014-11-02 VITALS — BP 120/62 | HR 71 | Ht 65.5 in | Wt 149.1 lb

## 2014-11-02 DIAGNOSIS — R0609 Other forms of dyspnea: Secondary | ICD-10-CM | POA: Diagnosis not present

## 2014-11-02 DIAGNOSIS — I25810 Atherosclerosis of coronary artery bypass graft(s) without angina pectoris: Secondary | ICD-10-CM | POA: Diagnosis not present

## 2014-11-02 DIAGNOSIS — I5032 Chronic diastolic (congestive) heart failure: Secondary | ICD-10-CM

## 2014-11-02 DIAGNOSIS — R0600 Dyspnea, unspecified: Secondary | ICD-10-CM | POA: Diagnosis not present

## 2014-11-02 DIAGNOSIS — I10 Essential (primary) hypertension: Secondary | ICD-10-CM

## 2014-11-02 NOTE — Progress Notes (Signed)
Cardiology Office Note   Date:  11/02/2014   ID:  [name], DOB [birth year], MRN [id number]  PCP:  Leo Grosser, MD  Cardiologist:  Dr.Nahser Chief Complaint: Dyspnea on exertion    History of Present Illness: [name] is a 81 y.o. female who presents for sending dyspnea on exertion. She has history of CABG in 1997 and again in 2010. EF was 40-45%. Patient never had chest pain but her symptoms were dyspnea on exertion. She also has CHF, hyperlipidemia, peripheral vascular disease status post right carotid endarterectomy. Axis scan Myoview in 12/22/13 was low risk.  Patient states she is taking care of her husband with pancreatic cancer. She's having to do a lot more work and exertion than she's used to. She also has a lot of anxiety surrounding it. She says she gets short of breath because she is working so hard to take care of him. He also had pneumonia recently and is just getting over that. She has worried because her only symptoms for coronary disease were dyspnea on exertion. He denies any chest pain, palpitations, dizziness or presyncope.    Past Medical History  Diagnosis Date  . CAD (coronary artery disease)     s/p CABG x 5 in 1997 & CABG x 3 in 2010; Last cath in April of 2010 showed grafts to be patent. EF is 40 to 45%  . CHF (congestive heart failure)     EF is 40 to 45%  . Hyperlipemia   . Pleural effusion     post surgical  . PVD (peripheral vascular disease)     S/P right CEA  . GERD (gastroesophageal reflux disease)   . History of cerebrovascular disease   . Asthma   . Diverticulosis   . Depression     following bypass surgery  . Hypertension   . Hepatitis A   . Sleep apnea   . Stroke 2010  . Endometriosis   . Allergy   . LBP (low back pain)   . Hiatal hernia   . Mixed incontinence urge and stress 04/22/2013  . Nocturia 04/22/2013    Past Surgical History  Procedure Laterality Date  . Coronary artery bypass graft  4098,1191    Original surgery  in 1997 x 5; Redo CABG x 3 in 2010 including free RIMA to distal LAD, SVG to OM and SVG to left posterolateral  . Cardiac catheterization  April 2010    Grafts were patent. EF is 40 to 45%  . Partial colectomy      due to colon abcess  . Knee surgery      left  . Carotid endarterectomy      right  . Appendectomy    . Left heart catheterization with coronary/graft angiogram N/A 03/12/2013    Procedure: LEFT HEART CATHETERIZATION WITH Isabel Caprice;  Surgeon: Peter M Swaziland, MD;  Location: [facility] CATH LAB;  Service: Cardiovascular;  Laterality: N/A;     Current Outpatient Prescriptions  Medication Sig Dispense Refill  . ALPRAZolam (XANAX) 0.25 MG tablet Take 1 tablet (0.25 mg total) by mouth 2 (two) times daily as needed for anxiety. 40 tablet 0  . aspirin EC 81 MG tablet Take 1 tablet (81 mg total) by mouth daily. 90 tablet 3  . BYSTOLIC 10 MG tablet TAKE 1 TABLET BY MOUTH EVERY DAY 30 tablet 11  . esomeprazole (NEXIUM) 40 MG capsule Take 40 mg by mouth daily at 12 noon.    . isosorbide mononitrate (IMDUR) 60  MG 24 hr tablet Take 1 tablet (60 mg total) by mouth daily. 90 tablet 3  . nitroGLYCERIN (NITROSTAT) 0.4 MG SL tablet Place 1 tablet (0.4 mg total) under the tongue every 5 (five) minutes as needed. 90 tablet 1  . potassium chloride (MICRO-K) 10 MEQ CR capsule TAKE 1 TABLET BY MOUTH DAILY. 90 capsule 1  . rosuvastatin (CRESTOR) 20 MG tablet Take 1 tablet (20 mg total) by mouth daily. 30 tablet 6   No current facility-administered medications for this visit.    Allergies:   Metoprolol; Phenergan; Prochlorperazine edisylate; Zetia; Niacin; and Sulfonamide derivatives    Social History:  The patient  reports that she has never smoked. She has never used smokeless tobacco. She reports that she does not drink alcohol or use illicit drugs.   Family History:  The patient's    family history includes Allergies in her father; Dementia in her brother; Diabetes in her maternal  grandmother, mother, paternal grandmother, and sister; Healthy in her sister; Heart attack in her father and mother; Heart disease in her mother and sister; Other in her brother, brother, and brother; Rheum arthritis in her mother; Stroke in her sister. There is no history of Colon cancer.    ROS:  Please see the history of present illness.   Otherwise, review of systems are positive for occasional cough, excessive fatigue, muscle in back pain, balance issues, anxiety.   All other systems are reviewed and negative.    PHYSICAL EXAM: VS:  BP 120/62 mmHg  Pulse 71  Ht 5' 5.5" (1.664 m)  Wt 149 lb 1.9 oz (67.64 kg)  BMI 24.43 kg/m2 , BMI Body mass index is 24.43 kg/(m^2). GEN: Well nourished, well developed, in no acute distress Neck: no JVD, HJR, carotid bruits, or masses Cardiac:  RRR; 2/6 systolic murmur at the left sternal border,no gallop, rubs, thrill or heave,  Respiratory:  clear to auscultation bilaterally, normal work of breathing GI: soft, nontender, nondistended, + BS MS: no deformity or atrophy Extremities: without cyanosis, clubbing, edema, good distal pulses bilaterally.  Skin: warm and dry, no rash Neuro:  Strength and sensation are intact    EKG:  EKG is ordered today. The ekg ordered today demonstrates sinus rhythm with left bundle branch block, no acute change Recent Labs: 01/15/2014: Hemoglobin 11.2*; Platelets 258 03/25/2014: ALT 12 07/06/2014: BUN 14; Creatinine 1.03; Potassium 4.4; Pro B Natriuretic peptide (BNP) 276.0*; Sodium 138    Lipid Panel    Component Value Date/Time   CHOL 161 03/25/2014 1039   TRIG 99.0 03/25/2014 1039   HDL 50.40 03/25/2014 1039   CHOLHDL 3 03/25/2014 1039   VLDL 19.8 03/25/2014 1039   LDLCALC 91 03/25/2014 1039      Wt Readings from Last 3 Encounters:  11/02/14 149 lb 1.9 oz (67.64 kg)  10/26/14 154 lb (69.854 kg)  10/15/14 153 lb 6.4 oz (69.582 kg)      Other studies Reviewed: Additional studies/ records that were  reviewed today include and review of the records demonstrates:  This scan Myoview 11/2013 considered low risk  2-D echo 11/09/13 Study Conclusions  - Left ventricle: The cavity size was normal. Wall thickness was   normal. Systolic function was normal. The estimated ejection   fraction was in the range of 55% to 60%. Wall motion was normal;   there were no regional wall motion abnormalities. Doppler   parameters are consistent with abnormal left ventricular   relaxation (grade 1 diastolic dysfunction). Doppler parameters  are consistent with high ventricular filling pressure. - Mitral valve: Calcified annulus. There was mild regurgitation. - Left atrium: The atrium was mildly dilated. - Atrial septum: There was an atrial septal aneurysm. - Pulmonary arteries: Systolic pressure was mildly increased.    ASSESSMENT AND PLAN: Dyspnea Patient complaining of worsening dyspnea on exertion. She is taking care of her husband with pancreatic cancer and is having to do a lot more than she's used to. This is probably contributing to her symptoms. She is very anxious and worried it could be her coronary disease. We'll order a Lexi scan Myoview to rule out ischemia. Follow-up with Dr.Nahser   CAD (coronary artery disease) Patient has had 2 bypass surgeries in the past. She now has worsening dyspnea on exertion which was her only symptom back then. Recommend Lexi scan Myoview to rule out ischemia.   Essential hypertension Stable   Chronic diastolic CHF (congestive heart failure) No evidence of heart failure on exam.      Signed, Jacolyn Reedy, PA-C  11/02/2014 10:52 AM    [facility] [address] [place], [place], [place]  [address] Phone: [phone]; Fax: [phone]

## 2014-11-02 NOTE — Patient Instructions (Signed)
Medication Instructions:   Your physician recommends that you continue on your current medications as directed. Please refer to the Current Medication list given to you today.  Labwork:    Testing/Procedures:  Your physician has requested that you have a lexiscan myoview. For further information please visit [email or web address]. Please follow instruction sheet, as given.   Follow-Up:  WITH DR Bennetta LaosNAUHSER 2 TO 3 MONTHS IF NOT RECALL  Any Other Special Instructions Will Be Listed Below (If Applicable).

## 2014-11-02 NOTE — Assessment & Plan Note (Signed)
No evidence of heart failure on exam 

## 2014-11-02 NOTE — Assessment & Plan Note (Signed)
Patient complaining of worsening dyspnea on exertion. She is taking care of her husband with pancreatic cancer and is having to do a lot more than she's used to. This is probably contributing to her symptoms. She is very anxious and worried it could be her coronary disease. We'll order a Lexi scan Myoview to rule out ischemia. Follow-up with Dr.Nahser

## 2014-11-02 NOTE — Assessment & Plan Note (Signed)
Stable

## 2014-11-02 NOTE — Assessment & Plan Note (Signed)
Patient has had 2 bypass surgeries in the past. She now has worsening dyspnea on exertion which was her only symptom back then. Recommend Lexi scan Myoview to rule out ischemia.

## 2014-11-05 ENCOUNTER — Encounter: Payer: Self-pay | Admitting: Family Medicine

## 2014-11-05 ENCOUNTER — Ambulatory Visit
Admission: RE | Admit: 2014-11-05 | Discharge: 2014-11-05 | Disposition: A | Discharge status: Home / Self Care | Payer: Medicare Other | Source: Ambulatory Visit | Attending: Family Medicine | Admitting: Family Medicine

## 2014-11-05 ENCOUNTER — Ambulatory Visit (INDEPENDENT_AMBULATORY_CARE_PROVIDER_SITE_OTHER): Payer: Medicare Other | Admitting: Family Medicine

## 2014-11-05 VITALS — BP 118/60 | HR 72 | Temp 97.6°F | Resp 18 | Ht 65.5 in | Wt 149.0 lb

## 2014-11-05 DIAGNOSIS — M545 Low back pain, unspecified: Secondary | ICD-10-CM

## 2014-11-05 DIAGNOSIS — M533 Sacrococcygeal disorders, not elsewhere classified: Secondary | ICD-10-CM

## 2014-11-05 MED ORDER — OXYCODONE-ACETAMINOPHEN 5-325 MG PO TABS: 1.0000 | ORAL_TABLET | Freq: Three times a day (TID) | ORAL | Status: DC | PRN

## 2014-11-05 NOTE — Progress Notes (Signed)
Subjective:    Patient ID: [name], female    DOB: [birth year], 81 y.o.   MRN: [id number]  HPI Yesterday, the patient was trying to sit in a swing.  However she missed the swelling and landed directly on her tailbone on a hard porch. She now reports significant coccydynia.  Her tailbone is extremely tender to palpation. She also has some tenderness to palpation around the lumbar spinous processes. She has decreased range of motion and antalgic gait. She has no pain in her hips. She denies any hip pain with ambulation or standing. She denies any symptoms of cauda equina syndrome or sciatica. Past Medical History  Diagnosis Date  . CAD (coronary artery disease)     s/p CABG x 5 in 1997 & CABG x 3 in 2010; Last cath in April of 2010 showed grafts to be patent. EF is 40 to 45%  . CHF (congestive heart failure)     EF is 40 to 45%  . Hyperlipemia   . Pleural effusion     post surgical  . PVD (peripheral vascular disease)     S/P right CEA  . GERD (gastroesophageal reflux disease)   . History of cerebrovascular disease   . Asthma   . Diverticulosis   . Depression     following bypass surgery  . Hypertension   . Hepatitis A   . Sleep apnea   . Stroke 2010  . Endometriosis   . Allergy   . LBP (low back pain)   . Hiatal hernia   . Mixed incontinence urge and stress 04/22/2013  . Nocturia 04/22/2013   Past Surgical History  Procedure Laterality Date  . Coronary artery bypass graft  4098,1191    Original surgery in 1997 x 5; Redo CABG x 3 in 2010 including free RIMA to distal LAD, SVG to OM and SVG to left posterolateral  . Cardiac catheterization  April 2010    Grafts were patent. EF is 40 to 45%  . Partial colectomy      due to colon abcess  . Knee surgery      left  . Carotid endarterectomy      right  . Appendectomy    . Left heart catheterization with coronary/graft angiogram N/A 03/12/2013    Procedure: LEFT HEART CATHETERIZATION WITH Isabel Caprice;   Surgeon: Peter M Swaziland, MD;  Location: [facility] CATH LAB;  Service: Cardiovascular;  Laterality: N/A;   Current Outpatient Prescriptions on File Prior to Visit  Medication Sig Dispense Refill  . ALPRAZolam (XANAX) 0.25 MG tablet Take 1 tablet (0.25 mg total) by mouth 2 (two) times daily as needed for anxiety. 40 tablet 0  . aspirin EC 81 MG tablet Take 1 tablet (81 mg total) by mouth daily. 90 tablet 3  . BYSTOLIC 10 MG tablet TAKE 1 TABLET BY MOUTH EVERY DAY 30 tablet 11  . esomeprazole (NEXIUM) 40 MG capsule Take 40 mg by mouth daily at 12 noon.    . isosorbide mononitrate (IMDUR) 60 MG 24 hr tablet Take 1 tablet (60 mg total) by mouth daily. 90 tablet 3  . nitroGLYCERIN (NITROSTAT) 0.4 MG SL tablet Place 1 tablet (0.4 mg total) under the tongue every 5 (five) minutes as needed. 90 tablet 1  . potassium chloride (MICRO-K) 10 MEQ CR capsule TAKE 1 TABLET BY MOUTH DAILY. 90 capsule 1  . rosuvastatin (CRESTOR) 20 MG tablet Take 1 tablet (20 mg total) by mouth daily. 30 tablet 6   No  current facility-administered medications on file prior to visit.   Allergies  Allergen Reactions  . Metoprolol Other (See Comments)    allopoecia  . Phenergan [Promethazine Hcl] Other (See Comments)    Facial numbness  . Prochlorperazine Edisylate Anaphylaxis  . Zetia [Ezetimibe] Nausea Only and Other (See Comments)    Abdominal pain  . Niacin Rash  . Sulfonamide Derivatives Nausea And Vomiting   History   Social History  . Marital Status: Married    Spouse Name: N/A  . Number of Children: 0  . Years of Education: N/A   Occupational History  . retired Runner, broadcasting/film/video    Social History Main Topics  . Smoking status: Never Smoker   . Smokeless tobacco: Never Used  . Alcohol Use: No  . Drug Use: No  . Sexual Activity: No   Other Topics Concern  . Not on file   Social History Narrative     Review of Systems  All other systems reviewed and are negative.      Objective:   Physical Exam    Constitutional: She appears well-developed and well-nourished. No distress.  Cardiovascular: Normal rate, regular rhythm and normal heart sounds.   Pulmonary/Chest: Effort normal and breath sounds normal.  Musculoskeletal:       Lumbar back: She exhibits decreased range of motion, tenderness, bony tenderness and pain. She exhibits no swelling, no edema, no deformity and no spasm.  Skin: She is not diaphoretic.  Vitals reviewed.         Assessment & Plan:  Coccydynia - Plan: oxyCODONE-acetaminophen (ROXICET) 5-325 MG per tablet  Right-sided low back pain without sciatica - Plan: DG Sacrum/Coccyx, DG Lumbar Spine Complete, oxyCODONE-acetaminophen (ROXICET) 5-325 MG per tablet  I believe the patient bruised her tailbone. I would like to obtain x-rays of the lumbar spine as well as the sacrum to rule out a fracture or compression fracture. Begin Percocet 5/325 one by mouth every 8 hours when necessary pain. I recommended rest for the next week. She can apply ice as needed for pain. Begin taking MiraLAX every day to prevent constipation from the necrotic. I cautioned the patient against narcotic-induced dizziness, sleepiness, and increased fall risk. However she is in significant pain and she is willing to take the medication except those risk

## 2014-11-17 ENCOUNTER — Telehealth (HOSPITAL_COMMUNITY): Payer: Self-pay | Admitting: *Deleted

## 2014-11-17 ENCOUNTER — Encounter: Payer: Self-pay | Admitting: Family Medicine

## 2014-11-17 NOTE — Telephone Encounter (Signed)
Patient given detailed instructions per Myocardial Perfusion Study Information Sheet for test on 11/22/14 at 12:00. Patient Notified to arrive 15 minutes early, and that it is imperative to arrive on time for appointment to keep from having the test rescheduled. Patient verbalized understanding. Antionette Char, RN

## 2014-11-22 ENCOUNTER — Ambulatory Visit (HOSPITAL_COMMUNITY): Payer: Medicare Other | Attending: Interventional Cardiology

## 2014-11-22 DIAGNOSIS — R0600 Dyspnea, unspecified: Secondary | ICD-10-CM

## 2014-11-22 DIAGNOSIS — R0609 Other forms of dyspnea: Secondary | ICD-10-CM

## 2014-11-22 LAB — MYOCARDIAL PERFUSION IMAGING
LV dias vol: 64 mL
LV sys vol: 1 mL
Peak HR: 96 {beats}/min
RATE: 0.27
Rest HR: 74 {beats}/min
SDS: 3
SRS: 5
SSS: 8
TID: 1.1

## 2014-11-22 MED ORDER — REGADENOSON 0.4 MG/5ML IV SOLN
0.4000 mg | Freq: Once | INTRAVENOUS | Status: AC
  Administered 2014-11-22: 0.4 mg via INTRAVENOUS

## 2014-11-22 MED ORDER — TECHNETIUM TC 99M SESTAMIBI GENERIC - CARDIOLITE
10.8000 | Freq: Once | INTRAVENOUS | Status: AC | PRN
  Administered 2014-11-22: 11 via INTRAVENOUS

## 2014-11-22 MED ORDER — TECHNETIUM TC 99M SESTAMIBI GENERIC - CARDIOLITE
31.7000 | Freq: Once | INTRAVENOUS | Status: AC | PRN
  Administered 2014-11-22: 31.7 via INTRAVENOUS

## 2014-11-23 ENCOUNTER — Encounter: Payer: Self-pay | Admitting: *Deleted

## 2014-11-30 ENCOUNTER — Other Ambulatory Visit: Payer: Self-pay | Admitting: Physician Assistant

## 2014-11-30 NOTE — Telephone Encounter (Signed)
LRF 10/15/14 #40  LOV 11/05/14  OK refill?

## 2014-11-30 NOTE — Telephone Encounter (Signed)
ok 

## 2014-12-01 NOTE — Telephone Encounter (Signed)
Medication refilled per protocol. 

## 2014-12-07 ENCOUNTER — Ambulatory Visit (INDEPENDENT_AMBULATORY_CARE_PROVIDER_SITE_OTHER): Payer: Medicare Other | Admitting: Family Medicine

## 2014-12-07 ENCOUNTER — Encounter: Payer: Self-pay | Admitting: Family Medicine

## 2014-12-07 DIAGNOSIS — R0781 Pleurodynia: Secondary | ICD-10-CM | POA: Diagnosis not present

## 2014-12-07 MED ORDER — OXYCODONE-ACETAMINOPHEN 5-325 MG PO TABS
1.0000 | ORAL_TABLET | Freq: Four times a day (QID) | ORAL | Status: DC | PRN
Start: 2014-12-07 — End: 2015-07-29

## 2014-12-07 NOTE — Progress Notes (Signed)
Subjective:    Patient ID: [name], female    DOB: 1928-07-20, 81 y.o.   MRN: [id number]  HPI Patient lost her balance this weekend and fell striking her left ribs on the ground. She now has a bruise approximately 4 cm x 6 cm over her seventh and eighth rib in the midaxillary line. She is extremely tender to palpation in this area. She denies any shortness of breath. She denies any chest pain. She denies any hemoptysis or cough. She denies any blood in her stool. She denies any blood in her urine. Past Medical History  Diagnosis Date  . CAD (coronary artery disease)     s/p CABG x 5 in 1997 & CABG x 3 in 2010; Last cath in April of 2010 showed grafts to be patent. EF is 40 to 45%  . CHF (congestive heart failure)     EF is 40 to 45%  . Hyperlipemia   . Pleural effusion     post surgical  . PVD (peripheral vascular disease)     S/P right CEA  . GERD (gastroesophageal reflux disease)   . History of cerebrovascular disease   . Asthma   . Diverticulosis   . Depression     following bypass surgery  . Hypertension   . Hepatitis A   . Sleep apnea   . Stroke 2010  . Endometriosis   . Allergy   . LBP (low back pain)   . Hiatal hernia   . Mixed incontinence urge and stress 04/22/2013  . Nocturia 04/22/2013   Past Surgical History  Procedure Laterality Date  . Coronary artery bypass graft  [id number],2010    Original surgery in 1997 x 5; Redo CABG x 3 in 2010 including free RIMA to distal LAD, SVG to OM and SVG to left posterolateral  . Cardiac catheterization  April 2010    Grafts were patent. EF is 40 to 45%  . Partial colectomy      due to colon abcess  . Knee surgery      left  . Carotid endarterectomy      right  . Appendectomy    . Left heart catheterization with coronary/graft angiogram N/A 03/12/2013    Procedure: LEFT HEART CATHETERIZATION WITH Isabel CapriceORONARY/GRAFT ANGIOGRAM;  Surgeon: Peter M SwazilandJordan, MD;  [facility];  Service: Cardiovascular;  Laterality: N/A;    Current Outpatient Prescriptions on File Prior to Visit  Medication Sig Dispense Refill  . ALPRAZolam (XANAX) 0.25 MG tablet TAKE 1 TABLET BY MOUTH TWICE A DAY AS NEEDED FOR ANXIETY 40 tablet 0  . aspirin EC 81 MG tablet Take 1 tablet (81 mg total) by mouth daily. 90 tablet 3  . BYSTOLIC 10 MG tablet TAKE 1 TABLET BY MOUTH EVERY DAY 30 tablet 11  . esomeprazole (NEXIUM) 40 MG capsule Take 40 mg by mouth daily at 12 noon.    . isosorbide mononitrate (IMDUR) 60 MG 24 hr tablet Take 1 tablet (60 mg total) by mouth daily. 90 tablet 3  . nitroGLYCERIN (NITROSTAT) 0.4 MG SL tablet Place 1 tablet (0.4 mg total) under the tongue every 5 (five) minutes as needed. 90 tablet 1  . potassium chloride (MICRO-K) 10 MEQ CR capsule TAKE 1 TABLET BY MOUTH DAILY. 90 capsule 1  . rosuvastatin (CRESTOR) 20 MG tablet Take 1 tablet (20 mg total) by mouth daily. 30 tablet 6   No current facility-administered medications on file prior to visit.   Allergies  Allergen Reactions  .  Metoprolol Other (See Comments)    allopoecia  . Phenergan [Promethazine Hcl] Other (See Comments)    Facial numbness  . Prochlorperazine Edisylate Anaphylaxis  . Zetia [Ezetimibe] Nausea Only and Other (See Comments)    Abdominal pain  . Niacin Rash  . Sulfonamide Derivatives Nausea And Vomiting   History   Social History  . Marital Status: Married    Spouse Name: N/A  . Number of Children: 0  . Years of Education: N/A   Occupational History  . retired Runner, broadcasting/film/video    Social History Main Topics  . Smoking status: Never Smoker   . Smokeless tobacco: Never Used  . Alcohol Use: No  . Drug Use: No  . Sexual Activity: No   Other Topics Concern  . Not on file   Social History Narrative      Review of Systems  All other systems reviewed and are negative.      Objective:   Physical Exam  Cardiovascular: Normal rate, regular rhythm and normal heart sounds.   Pulmonary/Chest: Effort normal and breath sounds normal. No  respiratory distress. She has no wheezes. She has no rales. She exhibits tenderness.  Abdominal: Soft. Bowel sounds are normal. She exhibits no distension. There is no tenderness. There is no rebound and no guarding.  Vitals reviewed.         Assessment & Plan:  Rib pain on left side - Plan: oxyCODONE-acetaminophen (ROXICET) 5-325 MG per tablet  I believe the patient has fractured ribs during the fall. However there is no acute process. There is no evidence of pneumonia or pneumothorax. There is no evidence of internal organ injury. Therefore I will treat the patient symptomatically with Percocet 5/325 one by mouth every 6 hours when necessary pain. Also recommended a rib belt and incentive spirometry exercises.

## 2015-01-04 NOTE — Progress Notes (Signed)
Cardiology Office Note   Date:  01/04/2015   ID:  [name], DOB 1929-03-07, MRN [id number]  PCP:  Leo Grosser, MD  Cardiologist:   Vesta Mixer, MD   Chief Complaint  Patient presents with  . Follow-up    CAD   1. Coronary artery disease: She status post CABG in 1997 and again in 2010. Heart catheterization in April 2010 and revealed severe native coronary artery disease. The LAD is occluded after giving off 2 large septal branches. The left circumflex artery has moderate irregularities and has competitive flow from the saphenous vein graft to the acute marginal artery. The right coronary artery is occluded. The SVG to the first obtuse marginal has minor luminal irregularities. The free RIMA originates off the hood of the SVG. It is nice graft and anastomosis to the distal LAD is normal. The SVG to right coronary artery is smooth and normal. The posterior descending artery and the posterior lateral segment artery are normal. Her ejection fraction is 40-45%. 2. Congestive heart failure-EF of 55-60%  3. Hyperlipidemia 4. Peripheral vascular disease status post right carotid endarterectomy  History of Present Illness:   [name] is an 81 y.o. female with the above noted history. She's not had any episodes of chest pain. She does note lots of fatigue and as a result she has not been exercising much on a treadmill. She gets out of breath community slight activity.  She's not had any syncope or presyncope.  June 09, 2012 She is doing well. She is just getting over a bad cold. She is not exercising much this winter. She was walking about 45 minutes a day prior to the winter months. She has had some muscular shoulder pain but no angina.   Sept. 14, 2015:  [name] is doing well. No problems. No angina. She is enrolled in [employer] but doesn't go often. She sits quite a bit during the day. She has some dyspena - especially with exertion.   Nov. 10, 2014  She is  doing well. She walking and exercising . No significant CP. She had an abnormal myoview and subsequent cath revealed an occluded SVG to OM.   Feb. 9, 2016:  [name] is a 81 y.o. female who presents for follow up of her CAD She is having more shortness of breath - especially at night.  She has not been doing much exercise  Her stress myoview in July was low risk .  Is having lots of back pain this afternoon.   January 05, 2015:  [name] is seen back today for follow up for her CAD. Her husband , Jonny Ruiz died recently of complications from pancreatic cancer .   He had a Hotel manager burial at the [employer] in [place] .  No cardiac complications . Larey Seat several months ago and still has some soreness on her left side of her chest .  Ive suggested that she get a cane.      Past Medical History  Diagnosis Date  . CAD (coronary artery disease)     s/p CABG x 5 in 1997 & CABG x 3 in 2010; Last cath in April of 2010 showed grafts to be patent. EF is 40 to 45%  . CHF (congestive heart failure)     EF is 40 to 45%  . Hyperlipemia   . Pleural effusion     post surgical  . PVD (peripheral vascular disease)     S/P right CEA  . GERD (  gastroesophageal reflux disease)   . History of cerebrovascular disease   . Asthma   . Diverticulosis   . Depression     following bypass surgery  . Hypertension   . Hepatitis A   . Sleep apnea   . Stroke 2010  . Endometriosis   . Allergy   . LBP (low back pain)   . Hiatal hernia   . Mixed incontinence urge and stress 04/22/2013  . Nocturia 04/22/2013    Past Surgical History  Procedure Laterality Date  . Coronary artery bypass graft  [id number]    Original surgery in 1997 x 5; Redo CABG x 3 in 2010 including free RIMA to distal LAD, SVG to OM and SVG to left posterolateral  . Cardiac catheterization  April 2010    Grafts were patent. EF is 40 to 45%  . Partial colectomy      due to colon abcess  . Knee surgery      left  . Carotid  endarterectomy      right  . Appendectomy    . Left heart catheterization with coronary/graft angiogram N/A 03/12/2013    Procedure: LEFT HEART CATHETERIZATION WITH Isabel Caprice;  Surgeon: Peter M Swaziland, MD;  Location: [facility] CATH LAB;  Service: Cardiovascular;  Laterality: N/A;     Current Outpatient Prescriptions  Medication Sig Dispense Refill  . ALPRAZolam (XANAX) 0.25 MG tablet TAKE 1 TABLET BY MOUTH TWICE A DAY AS NEEDED FOR ANXIETY 40 tablet 0  . aspirin EC 81 MG tablet Take 1 tablet (81 mg total) by mouth daily. 90 tablet 3  . BYSTOLIC 10 MG tablet TAKE 1 TABLET BY MOUTH EVERY DAY 30 tablet 11  . esomeprazole (NEXIUM) 40 MG capsule Take 40 mg by mouth daily at 12 noon.    . isosorbide mononitrate (IMDUR) 60 MG 24 hr tablet Take 1 tablet (60 mg total) by mouth daily. 90 tablet 3  . nitroGLYCERIN (NITROSTAT) 0.4 MG SL tablet Place 1 tablet (0.4 mg total) under the tongue every 5 (five) minutes as needed. 90 tablet 1  . oxyCODONE-acetaminophen (ROXICET) 5-325 MG per tablet Take 1 tablet by mouth every 6 (six) hours as needed for severe pain. 30 tablet 0  . potassium chloride (MICRO-K) 10 MEQ CR capsule TAKE 1 TABLET BY MOUTH DAILY. 90 capsule 1  . rosuvastatin (CRESTOR) 20 MG tablet Take 1 tablet (20 mg total) by mouth daily. 30 tablet 6   No current facility-administered medications for this visit.    Allergies:   Metoprolol; Phenergan; Prochlorperazine edisylate; Zetia; Niacin; and Sulfonamide derivatives    Social History:  The patient  reports that she has never smoked. She has never used smokeless tobacco. She reports that she does not drink alcohol or use illicit drugs.   Family History:  The patient's family history includes Allergies in her father; Dementia in her brother; Diabetes in her maternal grandmother, mother, paternal grandmother, and sister; Healthy in her sister; Heart attack in her father and mother; Heart disease in her mother and sister; Other in her  brother, brother, and brother; Rheum arthritis in her mother; Stroke in her sister. There is no history of Colon cancer.    ROS:  Please see the history of present illness.    Review of Systems: Constitutional:  denies fever, chills, diaphoresis, appetite change and fatigue.  HEENT: denies photophobia, eye pain, redness, hearing loss, ear pain, congestion, sore throat, rhinorrhea, sneezing, neck pain, neck stiffness and tinnitus.  Respiratory: denies SOB, DOE, cough,  chest tightness, and wheezing.  Cardiovascular: denies chest pain, palpitations and leg swelling.  Gastrointestinal: denies nausea, vomiting, abdominal pain, diarrhea, constipation, blood in stool.  Genitourinary: denies dysuria, urgency, frequency, hematuria, flank pain and difficulty urinating.  Musculoskeletal: denies  myalgias, back pain, joint swelling, arthralgias and gait problem.   Skin: denies pallor, rash and wound.  Neurological: denies dizziness, seizures, syncope, weakness, light-headedness, numbness and headaches.   Hematological: denies adenopathy, easy bruising, personal or family bleeding history.  Psychiatric/ Behavioral: denies suicidal ideation, mood changes, confusion, nervousness, sleep disturbance and agitation.       All other systems are reviewed and negative.    PHYSICAL EXAM: VS:  There were no vitals taken for this visit. , BMI There is no weight on file to calculate BMI. GEN: Well nourished, well developed, in no acute distress HEENT: normal Neck: no JVD, carotid bruits, or masses Cardiac: RRR; no murmurs, rubs, or gallops,no edema  Respiratory:  clear to auscultation bilaterally, normal work of breathing GI: soft, nontender, nondistended, + BS MS: no deformity or atrophy Skin: warm and dry, no rash Neuro:  Strength and sensation are intact Psych: normal   EKG:  EKG is ordered today. The ekg ordered today demonstrates NSR at 73.  Occasional PVCs and PACs. , LBBB    Recent  Labs: 01/15/2014: Hemoglobin 11.2*; Platelets 258 03/25/2014: ALT 12 07/06/2014: BUN 14; Creatinine, Ser 1.03; Potassium 4.4; Pro B Natriuretic peptide (BNP) 276.0*; Sodium 138    Lipid Panel    Component Value Date/Time   CHOL 161 03/25/2014 1039   TRIG 99.0 03/25/2014 1039   HDL 50.40 03/25/2014 1039   CHOLHDL 3 03/25/2014 1039   VLDL 19.8 03/25/2014 1039   LDLCALC 91 03/25/2014 1039      Wt Readings from Last 3 Encounters:  12/07/14 65.772 kg (145 lb)  11/22/14 67.586 kg (149 lb)  11/05/14 67.586 kg (149 lb)      Other studies Reviewed: Additional studies/ records that were reviewed today include: . Review of the above records demonstrates:    ASSESSMENT AND PLAN:  1. Coronary artery disease:   She is doing ok.  I don't think her current symptoms are due to CAD.   I suspect she is deconditioned. I've encouraged her to get more exercise.    2. Congestive heart failure - her last echo in June shows a normal EF .      3. Hyperlipidemia -  Will check labs again in 6 months.   4. Peripheral vascular disease status post right carotid endarterectomy.  5. Hx of falls:  Ive encouraged her to use a cane .   Current medicines are reviewed at length with the patient today.  The patient does not have concerns regarding medicines.  The following changes have been made:  no change   Disposition:   FU with me in 6 months     Signed, Steph Cheadle, Deloris Ping, MD  01/04/2015 1:17 PM    [facility] [address] [place], [place], [place]  [address] Phone: [phone]; Fax: [phone]

## 2015-01-05 ENCOUNTER — Encounter: Payer: Self-pay | Admitting: Cardiovascular Disease

## 2015-01-05 ENCOUNTER — Ambulatory Visit (INDEPENDENT_AMBULATORY_CARE_PROVIDER_SITE_OTHER): Payer: Medicare Other | Admitting: Cardiovascular Disease

## 2015-01-05 VITALS — BP 136/70 | HR 71 | Ht 65.5 in | Wt 145.0 lb

## 2015-01-05 DIAGNOSIS — I25810 Atherosclerosis of coronary artery bypass graft(s) without angina pectoris: Secondary | ICD-10-CM

## 2015-01-05 DIAGNOSIS — I2510 Atherosclerotic heart disease of native coronary artery without angina pectoris: Secondary | ICD-10-CM | POA: Diagnosis not present

## 2015-01-05 NOTE — Patient Instructions (Signed)

## 2015-01-17 ENCOUNTER — Ambulatory Visit (INDEPENDENT_AMBULATORY_CARE_PROVIDER_SITE_OTHER): Payer: Medicare Other | Admitting: Family Medicine

## 2015-01-17 ENCOUNTER — Encounter: Payer: Self-pay | Admitting: Family Medicine

## 2015-01-17 VITALS — BP 130/68 | HR 82 | Temp 98.0°F | Resp 20 | Ht 65.5 in | Wt 144.0 lb

## 2015-01-17 DIAGNOSIS — R0781 Pleurodynia: Secondary | ICD-10-CM

## 2015-01-17 NOTE — Progress Notes (Signed)
Subjective:    Patient ID: [name], female    DOB: [birth year], 81 y.o.   MRN: [id number]  HPI 12/07/14 Patient lost her balance this weekend and fell striking her left ribs on the ground. She now has a bruise approximately 4 cm x 6 cm over her seventh and eighth rib in the midaxillary line. She is extremely tender to palpation in this area. She denies any shortness of breath. She denies any chest pain. She denies any hemoptysis or cough. She denies any blood in her stool. She denies any blood in her urine.  At that time, my plan was: I believe the patient has fractured ribs during the fall. However there is no acute process. There is no evidence of pneumonia or pneumothorax. There is no evidence of internal organ injury. Therefore I will treat the patient symptomatically with Percocet 5/325 one by mouth every 6 hours when necessary pain. Also recommended a rib belt and incentive spirometry exercises.  01/17/15 She presents today complaining of a knot in the exact same area on her ribs where she was previously injured. The hematoma has gone away. There is no tenderness to palpation in that area. She denies any shortness of breath or pleurisy. There is a small palpable knot along the body of the rib below her left breast towards her axilla. Past Medical History  Diagnosis Date  . CAD (coronary artery disease)     s/p CABG x 5 in 1997 & CABG x 3 in 2010; Last cath in April of 2010 showed grafts to be patent. EF is 40 to 45%  . CHF (congestive heart failure)     EF is 40 to 45%  . Hyperlipemia   . Pleural effusion     post surgical  . PVD (peripheral vascular disease)     S/P right CEA  . GERD (gastroesophageal reflux disease)   . History of cerebrovascular disease   . Asthma   . Diverticulosis   . Depression     following bypass surgery  . Hypertension   . Hepatitis A   . Sleep apnea   . Stroke 2010  . Endometriosis   . Allergy   . LBP (low back pain)   . Hiatal hernia   . Mixed  incontinence urge and stress 04/22/2013  . Nocturia 04/22/2013   Past Surgical History  Procedure Laterality Date  . Coronary artery bypass graft  4098,1191    Original surgery in 1997 x 5; Redo CABG x 3 in 2010 including free RIMA to distal LAD, SVG to OM and SVG to left posterolateral  . Cardiac catheterization  April 2010    Grafts were patent. EF is 40 to 45%  . Partial colectomy      due to colon abcess  . Knee surgery      left  . Carotid endarterectomy      right  . Appendectomy    . Left heart catheterization with coronary/graft angiogram N/A 03/12/2013    Procedure: LEFT HEART CATHETERIZATION WITH Isabel Caprice;  Surgeon: Peter M Swaziland, MD;  Location: [facility] CATH LAB;  Service: Cardiovascular;  Laterality: N/A;   Current Outpatient Prescriptions on File Prior to Visit  Medication Sig Dispense Refill  . ALPRAZolam (XANAX) 0.25 MG tablet TAKE 1 TABLET BY MOUTH TWICE A DAY AS NEEDED FOR ANXIETY 40 tablet 0  . aspirin EC 81 MG tablet Take 1 tablet (81 mg total) by mouth daily. 90 tablet 3  . BYSTOLIC 10 MG  tablet TAKE 1 TABLET BY MOUTH EVERY DAY 30 tablet 11  . esomeprazole (NEXIUM) 40 MG capsule Take 40 mg by mouth daily at 12 noon.    . isosorbide mononitrate (IMDUR) 60 MG 24 hr tablet Take 1 tablet (60 mg total) by mouth daily. 90 tablet 3  . nitroGLYCERIN (NITROSTAT) 0.4 MG SL tablet Place 1 tablet (0.4 mg total) under the tongue every 5 (five) minutes as needed. 90 tablet 1  . oxyCODONE-acetaminophen (ROXICET) 5-325 MG per tablet Take 1 tablet by mouth every 6 (six) hours as needed for severe pain. 30 tablet 0  . potassium chloride (MICRO-K) 10 MEQ CR capsule TAKE 1 TABLET BY MOUTH DAILY. 90 capsule 1  . rosuvastatin (CRESTOR) 20 MG tablet Take 1 tablet (20 mg total) by mouth daily. 30 tablet 6   No current facility-administered medications on file prior to visit.   Allergies  Allergen Reactions  . Metoprolol Other (See Comments)    allopoecia  . Phenergan  [Promethazine Hcl] Other (See Comments)    Facial numbness  . Prochlorperazine Edisylate Anaphylaxis  . Zetia [Ezetimibe] Nausea Only and Other (See Comments)    Abdominal pain  . Niacin Rash  . Sulfonamide Derivatives Nausea And Vomiting   Social History   Social History  . Marital Status: Married    Spouse Name: N/A  . Number of Children: 0  . Years of Education: N/A   Occupational History  . retired Runner, broadcasting/film/video    Social History Main Topics  . Smoking status: Never Smoker   . Smokeless tobacco: Never Used  . Alcohol Use: No  . Drug Use: No  . Sexual Activity: No   Other Topics Concern  . Not on file   Social History Narrative      Review of Systems  All other systems reviewed and are negative.      Objective:   Physical Exam  Cardiovascular: Normal rate, regular rhythm and normal heart sounds.   Pulmonary/Chest: Effort normal and breath sounds normal. No respiratory distress. She has no wheezes. She has no rales. She exhibits tenderness.  Abdominal: Soft. Bowel sounds are normal. She exhibits no distension. There is no tenderness. There is no rebound and no guarding.  Vitals reviewed.         Assessment & Plan:  Rib pain on left side - Plan: DG Ribs Unilateral Left  I tried to reassure the patient that what I believe she is feeling is a fracture callus. I believe it is too much frequent since that this area of swelling on the bone occurred 4-6 weeks after she injured that exact same rib in the fall. However I will order an x-ray of her ribs that she can go get at her convenience for her peace of mind

## 2015-02-15 ENCOUNTER — Other Ambulatory Visit: Payer: Self-pay | Admitting: Cardiovascular Disease

## 2015-03-24 ENCOUNTER — Ambulatory Visit (INDEPENDENT_AMBULATORY_CARE_PROVIDER_SITE_OTHER): Payer: Medicare Other | Admitting: *Deleted

## 2015-03-24 DIAGNOSIS — Z23 Encounter for immunization: Secondary | ICD-10-CM | POA: Diagnosis not present

## 2015-03-24 NOTE — Progress Notes (Signed)
Patient ID: [name], female   DOB: July 24, 1928, 81 y.o.   MRN: [id number]  Patient seen in office for Influenza Vaccination.   Tolerated IM administration well.   Immunization history updated.

## 2015-04-08 ENCOUNTER — Other Ambulatory Visit: Payer: Self-pay | Admitting: *Deleted

## 2015-04-08 MED ORDER — ISOSORBIDE MONONITRATE ER 60 MG PO TB24: 60.0000 mg | ORAL_TABLET | Freq: Every day | ORAL | Status: DC

## 2015-04-12 ENCOUNTER — Other Ambulatory Visit: Payer: Self-pay | Admitting: Gastroenterology

## 2015-04-12 NOTE — Telephone Encounter (Signed)
Received a faxed medication refill from [facility] [address]. Medication refill denied and faxed back to the pharmacy stating patient needs and appointment.

## 2015-06-28 ENCOUNTER — Ambulatory Visit (INDEPENDENT_AMBULATORY_CARE_PROVIDER_SITE_OTHER): Payer: Medicare Other | Admitting: Family Medicine

## 2015-06-28 ENCOUNTER — Encounter: Payer: Self-pay | Admitting: Family Medicine

## 2015-06-28 VITALS — BP 128/70 | HR 76 | Temp 98.6°F | Resp 18 | Wt 138.0 lb

## 2015-06-28 DIAGNOSIS — R0602 Shortness of breath: Secondary | ICD-10-CM | POA: Diagnosis not present

## 2015-06-28 DIAGNOSIS — R5383 Other fatigue: Secondary | ICD-10-CM

## 2015-06-28 NOTE — Progress Notes (Signed)
Subjective:    Patient ID: [name], female    DOB: 04-22-1929, 82 y.o.   MRN: [id number]  HPI   patient is a very pleasant 82 year old white female who presents today complaining of fatigue and shortness of breath. Since I last saw the patient in Apr 07, 2023, her husband died from pancreatic cancer. She really suffered with depression over Christmas. She does report feeling very lonely and isolated. She has 2 nieces who live in [place] and [place] who occasionally call and check on her but she has no other family. Furthermore she was very active in her church however their church recently closed his stores and she is no longer attending the church. Therefore she feels extremely isolated and lonely and depressed. This may be playing a large role in her fatigue. However she also complains of shortness of breath. She has a significant past medical history of coronary artery disease status post CABG on 2 separate occasions. She also has congestive heart failure with an ejection fraction of 40-45%. The patient does report shortness of breath with exertion. She denies any angina. She denies any orthopnea. She denies any paroxysmal maternal dyspnea. She denies any cough or pleurisy. She denies any fluid retention. There is no edema on exam. She denies any melena in her stools. She denies any fevers or chills. Wt Readings from Last 3 Encounters:  06/28/15 138 lb (62.596 kg)  01/17/15 144 lb (65.318 kg)  01/05/15 145 lb (65.772 kg)     Patient has lost weight since when I last saw her. There is no evidence of fluid retention. Past Medical History  Diagnosis Date  . CAD (coronary artery disease)     s/p CABG x 5 in 1997 & CABG x 3 in 2010; Last cath in April of 2010 showed grafts to be patent. EF is 40 to 45%  . CHF (congestive heart failure)     EF is 40 to 45%  . Hyperlipemia   . Pleural effusion     post surgical  . PVD (peripheral vascular disease)     S/P right CEA  . GERD  (gastroesophageal reflux disease)   . History of cerebrovascular disease   . Asthma   . Diverticulosis   . Depression     following bypass surgery  . Hypertension   . Hepatitis A   . Sleep apnea   . Stroke 2010  . Endometriosis   . Allergy   . LBP (low back pain)   . Hiatal hernia   . Mixed incontinence urge and stress 04/22/2013  . Nocturia 04/22/2013   Past Surgical History  Procedure Laterality Date  . Coronary artery bypass graft  4098,1191    Original surgery in 1997 x 5; Redo CABG x 3 in 2010 including free RIMA to distal LAD, SVG to OM and SVG to left posterolateral  . Cardiac catheterization  April 2010    Grafts were patent. EF is 40 to 45%  . Partial colectomy      due to colon abcess  . Knee surgery      left  . Carotid endarterectomy      right  . Appendectomy    . Left heart catheterization with coronary/graft angiogram N/A 03/12/2013    Procedure: LEFT HEART CATHETERIZATION WITH Isabel Caprice;  Surgeon: Peter M Swaziland, MD;  Location: [facility] CATH LAB;  Service: Cardiovascular;  Laterality: N/A;   Current Outpatient Prescriptions on File Prior to Visit  Medication Sig Dispense Refill  .  ALPRAZolam (XANAX) 0.25 MG tablet TAKE 1 TABLET BY MOUTH TWICE A DAY AS NEEDED FOR ANXIETY 40 tablet 0  . aspirin EC 81 MG tablet Take 1 tablet (81 mg total) by mouth daily. 90 tablet 3  . BYSTOLIC 10 MG tablet TAKE 1 TABLET BY MOUTH EVERY DAY 30 tablet 11  . esomeprazole (NEXIUM) 40 MG capsule Take 40 mg by mouth daily at 12 noon.    . isosorbide mononitrate (IMDUR) 60 MG 24 hr tablet Take 1 tablet (60 mg total) by mouth daily. 90 tablet 3  . nitroGLYCERIN (NITROSTAT) 0.4 MG SL tablet Place 1 tablet (0.4 mg total) under the tongue every 5 (five) minutes as needed. 90 tablet 1  . oxyCODONE-acetaminophen (ROXICET) 5-325 MG per tablet Take 1 tablet by mouth every 6 (six) hours as needed for severe pain. 30 tablet 0  . potassium chloride (MICRO-K) 10 MEQ CR capsule TAKE 1  TABLET BY MOUTH DAILY. 90 capsule 1  . rosuvastatin (CRESTOR) 20 MG tablet TAKE 1 TABLET (20 MG TOTAL) BY MOUTH DAILY. 30 tablet 6   No current facility-administered medications on file prior to visit.   Allergies  Allergen Reactions  . Metoprolol Other (See Comments)    allopoecia  . Phenergan [Promethazine Hcl] Other (See Comments)    Facial numbness  . Prochlorperazine Edisylate Anaphylaxis  . Zetia [Ezetimibe] Nausea Only and Other (See Comments)    Abdominal pain  . Niacin Rash  . Sulfonamide Derivatives Nausea And Vomiting   Social History   Social History  . Marital Status: Married    Spouse Name: N/A  . Number of Children: 0  . Years of Education: N/A   Occupational History  . retired Runner, broadcasting/film/video    Social History Main Topics  . Smoking status: Never Smoker   . Smokeless tobacco: Never Used  . Alcohol Use: No  . Drug Use: No  . Sexual Activity: No   Other Topics Concern  . Not on file   Social History Narrative      Review of Systems  All other systems reviewed and are negative.      Objective:   Physical Exam  Constitutional: She is oriented to person, place, and time. She appears well-developed and well-nourished. No distress.  HENT:  Mouth/Throat: No oropharyngeal exudate.  Eyes: Conjunctivae are normal. No scleral icterus.  Neck: No JVD present. No thyromegaly present.  Cardiovascular: Normal rate, regular rhythm and normal heart sounds.   Pulmonary/Chest: Effort normal and breath sounds normal. No respiratory distress. She has no wheezes. She has no rales.  Abdominal: Soft. Bowel sounds are normal. She exhibits no distension. There is no tenderness. There is no rebound and no guarding.  Musculoskeletal: She exhibits no edema.  Lymphadenopathy:    She has no cervical adenopathy.  Neurological: She is alert and oriented to person, place, and time.  Skin: No rash noted. She is not diaphoretic.  Vitals reviewed.         Assessment & Plan:    Other fatigue - Plan: CBC with Differential/Platelet, COMPLETE METABOLIC PANEL WITH GFR, TSH, Vitamin B12  SOB (shortness of breath) - Plan: CBC with Differential/Platelet, EKG 12-Lead   I believe a large portion of her symptoms are due to depression and grieving. However I'm concerned by the weight loss and her shortness of breath. Therefore I'll obtain an EKG today in office. I will also screen for anemia and bone marrow disorders with a CBC. I will check her kidney and liver functions  with a CMP. I will screen for thyroid abnormalities with a TSH. I will evaluate for B12 deficiency with a B12 level. If lab work is normal, I will consider having the patient repeat an echocardiogram to evaluate for worsening CHF. Patient may be a good candidate for Entresto.   If the above workup is negative, I will then focused my treatment on treating her depression.   EKG today shows left axis deviation and left bundle branch block but no significant change compared to an EKG from June 2016. Therefore I'll proceed with the lab work I have ordered above and depending on the results of the lab work I will likely order an echocardiogram of the heart. Also consider a workup for malignancy.

## 2015-06-29 LAB — CBC WITH DIFFERENTIAL/PLATELET
Basophils Absolute: 0 10*3/uL (ref 0.0–0.1)
Basophils Relative: 0 % (ref 0–1)
Eosinophils Absolute: 0.1 10*3/uL (ref 0.0–0.7)
Eosinophils Relative: 1 % (ref 0–5)
HCT: 37.4 % (ref 36.0–46.0)
Hemoglobin: 12.3 g/dL (ref 12.0–15.0)
Lymphocytes Relative: 44 % (ref 12–46)
Lymphs Abs: 3.1 10*3/uL (ref 0.7–4.0)
MCH: 30.1 pg (ref 26.0–34.0)
MCHC: 32.9 g/dL (ref 30.0–36.0)
MCV: 91.7 fL (ref 78.0–100.0)
MPV: 9.2 fL (ref 8.6–12.4)
Monocytes Absolute: 0.6 10*3/uL (ref 0.1–1.0)
Monocytes Relative: 8 % (ref 3–12)
Neutro Abs: 3.3 10*3/uL (ref 1.7–7.7)
Neutrophils Relative %: 47 % (ref 43–77)
Platelets: 234 10*3/uL (ref 150–400)
RBC: 4.08 MIL/uL (ref 3.87–5.11)
RDW: 13.9 % (ref 11.5–15.5)
WBC: 7.1 10*3/uL (ref 4.0–10.5)

## 2015-06-29 LAB — COMPLETE METABOLIC PANEL WITH GFR
ALT: 9 U/L (ref 6–29)
AST: 14 U/L (ref 10–35)
Albumin: 3.6 g/dL (ref 3.6–5.1)
Alkaline Phosphatase: 64 U/L (ref 33–130)
BUN: 10 mg/dL (ref 7–25)
CO2: 26 mmol/L (ref 20–31)
Calcium: 9.5 mg/dL (ref 8.6–10.4)
Chloride: 105 mmol/L (ref 98–110)
Creat: 0.78 mg/dL (ref 0.60–0.88)
GFR, Est African American: 81 mL/min (ref >=60)
GFR, Est Non African American: 70 mL/min (ref >=60)
Glucose, Bld: 122 mg/dL — ABNORMAL HIGH (ref 70–99)
Potassium: 4.1 mmol/L (ref 3.5–5.3)
Sodium: 140 mmol/L (ref 135–146)
Total Bilirubin: 0.3 mg/dL (ref 0.2–1.2)
Total Protein: 6.2 g/dL (ref 6.1–8.1)

## 2015-06-29 LAB — VITAMIN B12: Vitamin B-12: 324 pg/mL (ref 211–911)

## 2015-06-29 LAB — TSH: TSH: 1.774 u[IU]/mL (ref 0.350–4.500)

## 2015-07-01 ENCOUNTER — Other Ambulatory Visit: Payer: Self-pay | Admitting: *Deleted

## 2015-07-01 DIAGNOSIS — R0602 Shortness of breath: Secondary | ICD-10-CM

## 2015-07-06 ENCOUNTER — Other Ambulatory Visit: Payer: Self-pay | Admitting: Family Medicine

## 2015-07-06 DIAGNOSIS — R0602 Shortness of breath: Secondary | ICD-10-CM

## 2015-07-07 ENCOUNTER — Telehealth: Payer: Self-pay | Admitting: *Deleted

## 2015-07-07 NOTE — Telephone Encounter (Signed)
Pt has appt scheduled for Feb 10th at 8am for ECHO at [facility] st, lmtrc to pt for appt information

## 2015-07-08 ENCOUNTER — Other Ambulatory Visit (HOSPITAL_COMMUNITY): Payer: Medicare Other

## 2015-07-08 NOTE — Telephone Encounter (Signed)
Pt was rescheduled for ECHO on Feb 20th at 2pm at [facility] st, pt is aware of appt

## 2015-07-20 ENCOUNTER — Ambulatory Visit (HOSPITAL_COMMUNITY): Payer: Medicare Other | Attending: Cardiology

## 2015-07-20 ENCOUNTER — Other Ambulatory Visit: Payer: Self-pay

## 2015-07-20 DIAGNOSIS — R0600 Dyspnea, unspecified: Secondary | ICD-10-CM | POA: Diagnosis present

## 2015-07-20 DIAGNOSIS — I059 Rheumatic mitral valve disease, unspecified: Secondary | ICD-10-CM | POA: Diagnosis not present

## 2015-07-20 DIAGNOSIS — I071 Rheumatic tricuspid insufficiency: Secondary | ICD-10-CM | POA: Insufficient documentation

## 2015-07-20 DIAGNOSIS — R0602 Shortness of breath: Secondary | ICD-10-CM | POA: Diagnosis not present

## 2015-07-20 DIAGNOSIS — I119 Hypertensive heart disease without heart failure: Secondary | ICD-10-CM | POA: Insufficient documentation

## 2015-07-20 DIAGNOSIS — I358 Other nonrheumatic aortic valve disorders: Secondary | ICD-10-CM | POA: Diagnosis not present

## 2015-07-20 DIAGNOSIS — E785 Hyperlipidemia, unspecified: Secondary | ICD-10-CM | POA: Insufficient documentation

## 2015-07-27 ENCOUNTER — Encounter: Payer: Self-pay | Admitting: Gastroenterology

## 2015-07-29 ENCOUNTER — Encounter: Payer: Self-pay | Admitting: Family Medicine

## 2015-07-29 ENCOUNTER — Ambulatory Visit (INDEPENDENT_AMBULATORY_CARE_PROVIDER_SITE_OTHER): Payer: Medicare Other | Admitting: Family Medicine

## 2015-07-29 VITALS — BP 122/70 | HR 70 | Temp 98.1°F | Resp 18 | Ht 65.5 in | Wt 135.0 lb

## 2015-07-29 DIAGNOSIS — F32A Depression, unspecified: Secondary | ICD-10-CM

## 2015-07-29 DIAGNOSIS — F329 Major depressive disorder, single episode, unspecified: Secondary | ICD-10-CM | POA: Diagnosis not present

## 2015-07-29 MED ORDER — ESCITALOPRAM OXALATE 10 MG PO TABS: 10.0000 mg | ORAL_TABLET | Freq: Every day | ORAL | Status: DC

## 2015-07-29 NOTE — Progress Notes (Signed)
Subjective:    Patient ID: [name], female    DOB: 1928-06-19, 82 y.o.   MRN: [id number]  HPI 06/28/15  patient is a very pleasant 83 year old white female who presents today complaining of fatigue and shortness of breath. Since I last saw the patient in 2023-03-11, her husband died from pancreatic cancer. She really suffered with depression over Christmas. She does report feeling very lonely and isolated. She has 2 nieces who live in [place] and [place] who occasionally call and check on her but she has no other family. Furthermore she was very active in her church however their church recently closed his stores and she is no longer attending the church. Therefore she feels extremely isolated and lonely and depressed. This may be playing a large role in her fatigue. However she also complains of shortness of breath. She has a significant past medical history of coronary artery disease status post CABG on 2 separate occasions. She also has congestive heart failure with an ejection fraction of 40-45%. The patient does report shortness of breath with exertion. She denies any angina. She denies any orthopnea. She denies any paroxysmal maternal dyspnea. She denies any cough or pleurisy. She denies any fluid retention. There is no edema on exam. She denies any melena in her stools. She denies any fevers or chills.  AT that time, my plan was:  I believe a large portion of her symptoms are due to depression and grieving. However I'm concerned by the weight loss and her shortness of breath. Therefore I'll obtain an EKG today in office. I will also screen for anemia and bone marrow disorders with a CBC. I will check her kidney and liver functions with a CMP. I will screen for thyroid abnormalities with a TSH. I will evaluate for B12 deficiency with a B12 level. If lab work is normal, I will consider having the patient repeat an echocardiogram to evaluate for worsening CHF. Patient may be a good candidate for  Entresto.   If the above workup is negative, I will then focused my treatment on treating her depression.   EKG today shows left axis deviation and left bundle branch block but no significant change compared to an EKG from June 2016. Therefore I'll proceed with the lab work I have ordered above and depending on the results of the lab work I will likely order an echocardiogram of the heart. Also consider a workup for malignancy.    07/29/15 Labs were reassuring. Echocardiogram revealed no explanation for the patient's fatigue: Study Conclusions  - Left ventricle: The cavity size was normal. Systolic function was normal. The estimated ejection fraction was in the range of 55% to 60%. Wall motion was normal; there were no regional wall motion abnormalities. Doppler parameters are consistent with abnormal left ventricular relaxation (grade 1 diastolic dysfunction). - Aortic valve: Trileaflet; mildly thickened, mildly calcified leaflets. - Mitral valve: Calcified annulus. Mildly thickened, mildly calcified leaflets . - Left atrium: The atrium was mildly dilated. - Tricuspid valve: There was moderate regurgitation. - Pulmonary arteries: Systolic pressure was moderately increased.  she is here today to discuss. She is very sad. Frequently she begins to cry during our encounter. She states that she is not sleeping well. She is very afraid at night alone in their home. She states that she is very lonely. She admits that depression may be playing a role. Furthermore the lack of sleep is making her very tired. She is very interested in treatment for depression.  She denies any suicidal ideation. She is currently in counseling with the grief support group Past Medical History  Diagnosis Date  . CAD (coronary artery disease)     s/p CABG x 5 in 1997 & CABG x 3 in 2010; Last cath in April of 2010 showed grafts to be patent. EF is 40 to 45%  . CHF (congestive heart failure) (HCC)     EF is 40  to 45%  . Hyperlipemia   . Pleural effusion     post surgical  . PVD (peripheral vascular disease) (HCC)     S/P right CEA  . GERD (gastroesophageal reflux disease)   . History of cerebrovascular disease   . Asthma   . Diverticulosis   . Depression     following bypass surgery  . Hypertension   . Hepatitis A   . Sleep apnea   . Stroke (HCC) 2010  . Endometriosis   . Allergy   . LBP (low back pain)   . Hiatal hernia   . Mixed incontinence urge and stress 04/22/2013  . Nocturia 04/22/2013   Past Surgical History  Procedure Laterality Date  . Coronary artery bypass graft  [id number],2010    Original surgery in 1997 x 5; Redo CABG x 3 in 2010 including free RIMA to distal LAD, SVG to OM and SVG to left posterolateral  . Cardiac catheterization  April 2010    Grafts were patent. EF is 40 to 45%  . Partial colectomy      due to colon abcess  . Knee surgery      left  . Carotid endarterectomy      right  . Appendectomy    . Left heart catheterization with coronary/graft angiogram N/A 03/12/2013    Procedure: LEFT HEART CATHETERIZATION WITH Isabel CapriceORONARY/GRAFT ANGIOGRAM;  Surgeon: Peter M SwazilandJordan, MD;  [facility];  Service: Cardiovascular;  Laterality: N/A;   Current Outpatient Prescriptions on File Prior to Visit  Medication Sig Dispense Refill  . ALPRAZolam (XANAX) 0.25 MG tablet TAKE 1 TABLET BY MOUTH TWICE A DAY AS NEEDED FOR ANXIETY 40 tablet 0  . aspirin EC 81 MG tablet Take 1 tablet (81 mg total) by mouth daily. 90 tablet 3  . BYSTOLIC 10 MG tablet TAKE 1 TABLET BY MOUTH EVERY DAY 30 tablet 11  . esomeprazole (NEXIUM) 40 MG capsule Take 40 mg by mouth daily at 12 noon.    . isosorbide mononitrate (IMDUR) 60 MG 24 hr tablet Take 1 tablet (60 mg total) by mouth daily. 90 tablet 3  . nitroGLYCERIN (NITROSTAT) 0.4 MG SL tablet Place 1 tablet (0.4 mg total) under the tongue every 5 (five) minutes as needed. 90 tablet 1  . oxyCODONE-acetaminophen (ROXICET) 5-325 MG per tablet  Take 1 tablet by mouth every 6 (six) hours as needed for severe pain. (Patient not taking: Reported on 06/28/2015) 30 tablet 0  . potassium chloride (MICRO-K) 10 MEQ CR capsule TAKE 1 TABLET BY MOUTH DAILY. 90 capsule 1  . rosuvastatin (CRESTOR) 20 MG tablet TAKE 1 TABLET (20 MG TOTAL) BY MOUTH DAILY. 30 tablet 6   No current facility-administered medications on file prior to visit.   Allergies  Allergen Reactions  . Metoprolol Other (See Comments)    allopoecia  . Phenergan [Promethazine Hcl] Other (See Comments)    Facial numbness  . Prochlorperazine Edisylate Anaphylaxis  . Zetia [Ezetimibe] Nausea Only and Other (See Comments)    Abdominal pain  . Niacin Rash  . Sulfonamide  Derivatives Nausea And Vomiting   Social History   Social History  . Marital Status: Married    Spouse Name: N/A  . Number of Children: 0  . Years of Education: N/A   Occupational History  . retired Runner, broadcasting/film/video    Social History Main Topics  . Smoking status: Never Smoker   . Smokeless tobacco: Never Used  . Alcohol Use: No  . Drug Use: No  . Sexual Activity: No   Other Topics Concern  . Not on file   Social History Narrative      Review of Systems  All other systems reviewed and are negative.      Objective:   Physical Exam  Constitutional: She is oriented to person, place, and time. She appears well-developed and well-nourished. No distress.  HENT:  Mouth/Throat: No oropharyngeal exudate.  Eyes: Conjunctivae are normal. No scleral icterus.  Neck: No JVD present. No thyromegaly present.  Cardiovascular: Normal rate, regular rhythm and normal heart sounds.   Pulmonary/Chest: Effort normal and breath sounds normal. No respiratory distress. She has no wheezes. She has no rales.  Abdominal: Soft. Bowel sounds are normal. She exhibits no distension. There is no tenderness. There is no rebound and no guarding.  Musculoskeletal: She exhibits no edema.  Lymphadenopathy:    She has no cervical  adenopathy.  Neurological: She is alert and oriented to person, place, and time.  Skin: No rash noted. She is not diaphoretic.  Vitals reviewed.         Assessment & Plan:  Depression - Plan: escitalopram (LEXAPRO) 10 MG tablet   begin Lexapro 10 mg by mouth daily and recheck in one month sooner if worse

## 2015-08-01 ENCOUNTER — Telehealth: Payer: Self-pay | Admitting: Family Medicine

## 2015-08-01 NOTE — Telephone Encounter (Signed)
Niece stopped by [name] [phone]  She states that since the patient came in last week she has been out of it She states that patient just started the new prescription on Saturday. She hasn't gotten dressed yesterday and today she had gotten dressed but she was out of it when niece stopped by today. She states that the patient took oxycoton for back pain last week. Niece has tried cleaning out her old medication.

## 2015-08-01 NOTE — Telephone Encounter (Signed)
Per [facility] pt is to stop the medication - pt aware to stop Lexapro

## 2015-08-10 ENCOUNTER — Telehealth: Payer: Self-pay | Admitting: Family Medicine

## 2015-08-10 NOTE — Telephone Encounter (Signed)
Number in note has been dc'd and tried to call on cell # and no answer and no vm

## 2015-08-10 NOTE — Telephone Encounter (Signed)
Pt left a vm requesting to speak with Dr. Tanya NonesPickard about one of her medications. [phone]

## 2015-08-21 ENCOUNTER — Other Ambulatory Visit: Payer: Self-pay | Admitting: Cardiovascular Disease

## 2016-01-19 ENCOUNTER — Other Ambulatory Visit: Payer: Self-pay | Admitting: Cardiovascular Disease

## 2016-02-16 ENCOUNTER — Other Ambulatory Visit: Payer: Self-pay | Admitting: Cardiovascular Disease

## 2016-03-01 ENCOUNTER — Other Ambulatory Visit: Payer: Self-pay | Admitting: Cardiovascular Disease

## 2016-03-01 MED ORDER — NEBIVOLOL HCL 10 MG PO TABS: 10.0000 mg | ORAL_TABLET | Freq: Every day | ORAL | 0 refills | Status: DC

## 2016-03-15 ENCOUNTER — Ambulatory Visit (INDEPENDENT_AMBULATORY_CARE_PROVIDER_SITE_OTHER): Payer: Medicare Other | Admitting: *Deleted

## 2016-03-15 ENCOUNTER — Ambulatory Visit: Payer: Medicare Other

## 2016-03-15 DIAGNOSIS — Z23 Encounter for immunization: Secondary | ICD-10-CM | POA: Diagnosis not present

## 2016-03-15 NOTE — Progress Notes (Signed)
Patient ID: [name], female   DOB: [birth year], 82 y.o.   MRN: [id number]  Patient seen in office for Influenza Vaccination.   Tolerated IM administration well.   Immunization history updated.

## 2016-03-18 ENCOUNTER — Other Ambulatory Visit: Payer: Self-pay | Admitting: Cardiovascular Disease

## 2016-03-20 NOTE — Telephone Encounter (Signed)
Left message for patient to call and schedule a soon appointment for follow-up.  May refill #30 with no refills.

## 2016-03-22 NOTE — Telephone Encounter (Signed)
Message sent to scheduling pool to call patient and schedule her for an appointment.

## 2016-03-22 NOTE — Telephone Encounter (Signed)
Ok to fill bystolic I thinks she needs to see me soon

## 2016-03-29 ENCOUNTER — Ambulatory Visit: Payer: Medicare Other | Admitting: Cardiovascular Disease

## 2016-03-29 ENCOUNTER — Ambulatory Visit (INDEPENDENT_AMBULATORY_CARE_PROVIDER_SITE_OTHER): Payer: Medicare Other | Admitting: Cardiovascular Disease

## 2016-03-29 ENCOUNTER — Encounter: Payer: Self-pay | Admitting: Cardiovascular Disease

## 2016-03-29 VITALS — BP 124/58 | HR 68 | Ht 65.0 in | Wt 133.0 lb

## 2016-03-29 DIAGNOSIS — I10 Essential (primary) hypertension: Secondary | ICD-10-CM

## 2016-03-29 DIAGNOSIS — I2510 Atherosclerotic heart disease of native coronary artery without angina pectoris: Secondary | ICD-10-CM

## 2016-03-29 DIAGNOSIS — I25810 Atherosclerosis of coronary artery bypass graft(s) without angina pectoris: Secondary | ICD-10-CM | POA: Diagnosis not present

## 2016-03-29 MED ORDER — NEBIVOLOL HCL 10 MG PO TABS: 10.0000 mg | ORAL_TABLET | Freq: Every day | ORAL | 3 refills | Status: DC

## 2016-03-29 NOTE — Progress Notes (Signed)
Cardiology Office Note   Date:  03/29/2016   ID:  [name], DOB 10-08-1928, MRN [id number]  PCP:  Leo Grosser, MD  Cardiologist:   Kristeen Miss, MD   Chief Complaint  Patient presents with  . Coronary Artery Disease   1. Coronary artery disease: She status post CABG in 1997 and again in 2010. Heart catheterization in April 2010 and revealed severe native coronary artery disease. The LAD is occluded after giving off 2 large septal branches. The left circumflex artery has moderate irregularities and has competitive flow from the saphenous vein graft to the acute marginal artery. The right coronary artery is occluded. The SVG to the first obtuse marginal has minor luminal irregularities. The free RIMA originates off the hood of the SVG. It is nice graft and anastomosis to the distal LAD is normal. The SVG to right coronary artery is smooth and normal. The posterior descending artery and the posterior lateral segment artery are normal. Her ejection fraction is 40-45%. 2. Congestive heart failure-EF of 55-60%  3. Hyperlipidemia 4. Peripheral vascular disease status post right carotid endarterectomy  History of Present Illness:   [name] is an 82 y.o. female with the above noted history. She's not had any episodes of chest pain. She does note lots of fatigue and as a result she has not been exercising much on a treadmill. She gets out of breath community slight activity.  She's not had any syncope or presyncope.  June 09, 2012 She is doing well. She is just getting over a bad cold. She is not exercising much this winter. She was walking about 45 minutes a day prior to the winter months. She has had some muscular shoulder pain but no angina.   Sept. 14, 2015:  [name] is doing well. No problems. No angina. She is enrolled in [employer] but doesn't go often. She sits quite a bit during the day. She has some dyspena - especially with exertion.   Nov. 10, 2014  She is  doing well. She walking and exercising . No significant CP. She had an abnormal myoview and subsequent cath revealed an occluded SVG to OM.   Feb. 9, 2016:  [name] is a 82 y.o. female who presents for follow up of her CAD She is having more shortness of breath - especially at night.  She has not been doing much exercise  Her stress myoview in July was low risk .  Is having lots of back pain this afternoon.   January 05, 2015:  [name] is seen back today for follow up for her CAD. Her husband , Jonny Ruiz died recently of complications from pancreatic cancer .   He had a Hotel manager burial at the [employer] in [place] .  No cardiac complications . Larey Seat several months ago and still has some soreness on her left side of her chest .  Ive suggested that she get a cane.   Nov. 2, 2017  [name] is seen back after 15 month absence.  Still living at home.  Her husband , Jonny Ruiz,  died last year She is scared living there.   Does not have any close neighbors. Doing well from a cardiac standpoint Limited by back pain.      Past Medical History:  Diagnosis Date  . Allergy   . Asthma   . CAD (coronary artery disease)    s/p CABG x 5 in 1997 & CABG x 3 in 2010; Last cath in April of 2010  showed grafts to be patent. EF is 40 to 45%  . CHF (congestive heart failure) (HCC)    EF is 40 to 45%  . Depression    following bypass surgery  . Diverticulosis   . Endometriosis   . GERD (gastroesophageal reflux disease)   . Hepatitis A   . Hiatal hernia   . History of cerebrovascular disease   . Hyperlipemia   . Hypertension   . LBP (low back pain)   . Mixed incontinence urge and stress 04/22/2013  . Nocturia 04/22/2013  . Pleural effusion    post surgical  . PVD (peripheral vascular disease) (HCC)    S/P right CEA  . Sleep apnea   . Stroke [facility](HCC) 2010    Past Surgical History:  Procedure Laterality Date  . APPENDECTOMY    . CARDIAC CATHETERIZATION  April 2010   Grafts were patent. EF is  40 to 45%  . CAROTID ENDARTERECTOMY     right  . CORONARY ARTERY BYPASS GRAFT  [id number]   Original surgery in 1997 x 5; Redo CABG x 3 in 2010 including free RIMA to distal LAD, SVG to OM and SVG to left posterolateral  . KNEE SURGERY     left  . LEFT HEART CATHETERIZATION WITH CORONARY/GRAFT ANGIOGRAM N/A 03/12/2013   Procedure: LEFT HEART CATHETERIZATION WITH Isabel CapriceORONARY/GRAFT ANGIOGRAM;  Surgeon: Peter M SwazilandJordan, MD;  [facility];  Service: Cardiovascular;  Laterality: N/A;  . PARTIAL COLECTOMY     due to colon abcess     Current Outpatient Prescriptions  Medication Sig Dispense Refill  . ALPRAZolam (XANAX) 0.25 MG tablet TAKE 1 TABLET BY MOUTH TWICE A DAY AS NEEDED FOR ANXIETY 40 tablet 0  . aspirin EC 81 MG tablet Take 1 tablet (81 mg total) by mouth daily. 90 tablet 3  . escitalopram (LEXAPRO) 10 MG tablet Take 1 tablet (10 mg total) by mouth daily. 30 tablet 5  . esomeprazole (NEXIUM) 40 MG capsule Take 40 mg by mouth daily at 12 noon.    . isosorbide mononitrate (IMDUR) 60 MG 24 hr tablet Take 1 tablet (60 mg total) by mouth daily. 90 tablet 3  . nebivolol (BYSTOLIC) 10 MG tablet Take 1 tablet (10 mg total) by mouth daily. 15 tablet 0  . nebivolol (BYSTOLIC) 10 MG tablet Take 1 tablet (10 mg total) by mouth daily. Patient is overdue for an appointment and must call and schedule for further refills 30 tablet 0  . nitroGLYCERIN (NITROSTAT) 0.4 MG SL tablet Place 1 tablet (0.4 mg total) under the tongue every 5 (five) minutes as needed. 90 tablet 1  . potassium chloride (MICRO-K) 10 MEQ CR capsule Take 1 capsule (10 mEq total) by mouth daily. 90 capsule 1  . rosuvastatin (CRESTOR) 20 MG tablet TAKE 1 TABLET (20 MG TOTAL) BY MOUTH DAILY. 15 tablet 5   No current facility-administered medications for this visit.     Allergies:   Metoprolol; Phenergan [promethazine hcl]; Prochlorperazine edisylate; Zetia [ezetimibe]; Compazine [prochlorperazine]; Sulfur; Niacin; and Sulfonamide  derivatives    Social History:  The patient  reports that she has never smoked. She has never used smokeless tobacco. She reports that she does not drink alcohol or use drugs.   Family History:  The patient's family history includes Allergies in her father; Dementia in her brother; Diabetes in her maternal grandmother, mother, paternal grandmother, and sister; Healthy in her sister; Heart attack in her father and mother; Heart disease in her mother and sister;  Other in her brother, brother, and brother; Rheum arthritis in her mother; Stroke in her sister.    ROS:  Please see the history of present illness.    Review of Systems: Constitutional:  denies fever, chills, diaphoresis, appetite change and fatigue.  HEENT: denies photophobia, eye pain, redness, hearing loss, ear pain, congestion, sore throat, rhinorrhea, sneezing, neck pain, neck stiffness and tinnitus.  Respiratory: denies SOB, DOE, cough, chest tightness, and wheezing.  Cardiovascular: denies chest pain, palpitations and leg swelling.  Gastrointestinal: denies nausea, vomiting, abdominal pain, diarrhea, constipation, blood in stool.  Genitourinary: denies dysuria, urgency, frequency, hematuria, flank pain and difficulty urinating.  Musculoskeletal: denies  myalgias, back pain, joint swelling, arthralgias and gait problem.   Skin: denies pallor, rash and wound.  Neurological: denies dizziness, seizures, syncope, weakness, light-headedness, numbness and headaches.   Hematological: denies adenopathy, easy bruising, personal or family bleeding history.  Psychiatric/ Behavioral: denies suicidal ideation, mood changes, confusion, nervousness, sleep disturbance and agitation.       All other systems are reviewed and negative.    PHYSICAL EXAM: VS:  BP (!) 124/58   Pulse 68   Ht 5\' 5"  (1.651 m)   Wt 133 lb (60.3 kg)   BMI 22.13 kg/m  , BMI Body mass index is 22.13 kg/m. GEN: Well nourished, well developed, in no acute  distress  HEENT: normal  Neck: no JVD, carotid bruits, or masses Cardiac: RRR; no murmurs, rubs, or gallops,no edema  Respiratory:  clear to auscultation bilaterally, normal work of breathing GI: soft, nontender, nondistended, + BS MS: no deformity or atrophy  Skin: warm and dry, no rash Neuro:  Strength and sensation are intact Psych: normal   EKG:  EKG is ordered today. The ekg ordered today demonstrates NSR at 68.    LBBB    Recent Labs: 06/28/2015: ALT 9; BUN 10; Creat 0.78; Hemoglobin 12.3; Platelets 234; Potassium 4.1; Sodium 140; TSH 1.774    Lipid Panel    Component Value Date/Time   CHOL 161 03/25/2014 1039   TRIG 99.0 03/25/2014 1039   HDL 50.40 03/25/2014 1039   CHOLHDL 3 03/25/2014 1039   VLDL 19.8 03/25/2014 1039   LDLCALC 91 03/25/2014 1039      Wt Readings from Last 3 Encounters:  03/29/16 133 lb (60.3 kg)  07/29/15 135 lb (61.2 kg)  06/28/15 138 lb (62.6 kg)      Other studies Reviewed: Additional studies/ records that were reviewed today include: . Review of the above records demonstrates:    ASSESSMENT AND PLAN:  1. Coronary artery disease:   She is doing ok. , very depressed over the death of her husband a year ago   2. Congestive heart failure - her last echo in June shows a normal EF .      3. Hyperlipidemia -  Following . Will likely check labs at her next visit    4. Peripheral vascular disease status post right carotid endarterectomy.  5. Hx of falls:  Ive encouraged her to use a cane .   Current medicines are reviewed at length with the patient today.  The patient does not have concerns regarding medicines.  The following changes have been made:  no change   Disposition:   FU with me in 6 months     Signed, Kristeen MissPhilip Nahser, MD  03/29/2016 3:44 PM    [facility] [address], [place], [place]  [address] Phone: [phone]; Fax: [phone]

## 2016-03-29 NOTE — Patient Instructions (Signed)
Medication Instructions:  Your physician recommends that you continue on your current medications as directed. Please refer to the Current Medication list given to you today.   Labwork: None Ordered   Testing/Procedures: None Ordered   Follow-Up: Your physician wants you to follow-up in: 6 months with Dr. Nahser.  You will receive a reminder letter in the mail two months in advance. If you don't receive a letter, please call our office to schedule the follow-up appointment.   If you need a refill on your cardiac medications before your next appointment, please call your pharmacy.   Thank you for choosing [facility]! Giovanne Nickolson, RN [phone]    

## 2016-03-29 NOTE — Addendum Note (Signed)
Addended by: Levi AlandSWINYER, MICHELLE M on: 03/29/2016 04:08 PM   Modules accepted: Orders

## 2016-04-30 ENCOUNTER — Ambulatory Visit: Payer: Medicare Other | Admitting: Cardiovascular Disease

## 2016-06-14 ENCOUNTER — Other Ambulatory Visit: Payer: Self-pay | Admitting: Cardiovascular Disease

## 2016-06-22 ENCOUNTER — Other Ambulatory Visit: Payer: Self-pay | Admitting: Cardiovascular Disease

## 2016-07-12 ENCOUNTER — Other Ambulatory Visit: Payer: Self-pay | Admitting: Cardiovascular Disease

## 2016-10-15 ENCOUNTER — Other Ambulatory Visit: Payer: Self-pay | Admitting: Cardiovascular Disease

## 2016-10-15 MED ORDER — NITROGLYCERIN 0.4 MG SL SUBL: 0.4000 mg | SUBLINGUAL_TABLET | SUBLINGUAL | 4 refills | Status: DC | PRN

## 2016-10-18 ENCOUNTER — Encounter: Payer: Self-pay | Admitting: Cardiovascular Disease

## 2016-10-19 ENCOUNTER — Encounter: Payer: Self-pay | Admitting: Cardiovascular Disease

## 2016-10-19 ENCOUNTER — Encounter (INDEPENDENT_AMBULATORY_CARE_PROVIDER_SITE_OTHER): Payer: Self-pay

## 2016-10-19 ENCOUNTER — Ambulatory Visit (INDEPENDENT_AMBULATORY_CARE_PROVIDER_SITE_OTHER): Payer: Medicare Other | Admitting: Cardiovascular Disease

## 2016-10-19 VITALS — BP 124/70 | HR 66 | Ht 65.0 in | Wt 137.8 lb

## 2016-10-19 DIAGNOSIS — I10 Essential (primary) hypertension: Secondary | ICD-10-CM

## 2016-10-19 DIAGNOSIS — I2510 Atherosclerotic heart disease of native coronary artery without angina pectoris: Secondary | ICD-10-CM | POA: Diagnosis not present

## 2016-10-19 NOTE — Patient Instructions (Signed)
Medication Instructions:    Your physician recommends that you continue on your current medications as directed. Please refer to the Current Medication list given to you today.  - If you need a refill on your cardiac medications before your next appointment, please call your pharmacy.   Labwork:  Today: CMET & Lipid profile  Testing/Procedures:  None ordered  Follow-Up:  Your physician wants you to follow-up in: 6 months with Dr. Elberta Fortisamnitz.  You will receive a reminder letter in the mail two months in advance. If you don't receive a letter, please call our office to schedule the follow-up appointment.  Thank you for choosing [facility]!!

## 2016-10-19 NOTE — Progress Notes (Signed)
Cardiology Office Note   Date:  10/19/2016   ID:  [name], DOB September 11, 1928, MRN [id number]  PCP:  [name] Brooks, MD  Cardiologist:   [name] Miss, MD   Chief Complaint  Patient presents with  . Follow-up    CAD/Shotness of breath   1. Coronary artery disease: She status post CABG in 1997 and again in 2010. Heart catheterization in April 2010 and revealed severe native coronary artery disease. The LAD is occluded after giving off 2 large septal branches. The left circumflex artery has moderate irregularities and has competitive flow from the saphenous vein graft to the acute marginal artery. The right coronary artery is occluded. The SVG to the first obtuse marginal has minor luminal irregularities. The free RIMA originates off the hood of the SVG. It is nice graft and anastomosis to the distal LAD is normal. The SVG to right coronary artery is smooth and normal. The posterior descending artery and the posterior lateral segment artery are normal. Her ejection fraction is 40-45%. 2. Congestive heart failure-EF of 55-60%  3. Hyperlipidemia 4. Peripheral vascular disease status post right carotid endarterectomy  History of Present Illness:   [name] is an 83 y.o. female with the above noted history. She's not had any episodes of chest pain. She does note lots of fatigue and as a result she has not been exercising much on a treadmill. She gets out of breath community slight activity.  She's not had any syncope or presyncope.  June 09, 2012 She is doing well. She is just getting over a bad cold. She is not exercising much this winter. She was walking about 45 minutes a day prior to the winter months. She has had some muscular shoulder pain but no angina.   Sept. 14, 2015:  [name] is doing well. No problems. No angina. She is enrolled in [employer] but doesn't go often. She sits quite a bit during the day. She has some dyspena - especially with exertion.   Nov. 10,  2014  She is doing well. She walking and exercising . No significant CP. She had an abnormal myoview and subsequent cath revealed an occluded SVG to OM.   Feb. 9, 2016:  [name] is a 83 y.o. female who presents for follow up of her CAD She is having more shortness of breath - especially at night.  She has not been doing much exercise  Her stress myoview in July was low risk .  Is having lots of back pain this afternoon.   January 05, 2015:  [name] is seen back today for follow up for her CAD. Her husband , Jonny Ruiz died recently of complications from pancreatic cancer .   He had a Hotel manager burial at the [employer] in [place] .  No cardiac complications . Larey Seat several months ago and still has some soreness on her left side of her chest .  Ive suggested that she get a cane.   Nov. 2, 2017  [name] is seen back after 15 month absence.  Still living at home.  Her husband , Jonny Ruiz,  died last year She is scared living there.   Does not have any close neighbors. Doing well from a cardiac standpoint Limited by back pain.     Oct 19, 2016:  [name] is doing well. Has some shortness of breath  Still in her house  Complains that her memory is terrible .  Complains of swelling in her feet - eats out quite  a lot.  Drinks Boost daily    Past Medical History:  Diagnosis Date  . Allergy   . Asthma   . CAD (coronary artery disease)    s/p CABG x 5 in 1997 & CABG x 3 in 2010; Last cath in April of 2010 showed grafts to be patent. EF is 40 to 45%  . CHF (congestive heart failure) (HCC)    EF is 40 to 45%  . Depression    following bypass surgery  . Diverticulosis   . Endometriosis   . GERD (gastroesophageal reflux disease)   . Hepatitis A   . Hiatal hernia   . History of cerebrovascular disease   . Hyperlipemia   . Hypertension   . LBP (low back pain)   . Mixed incontinence urge and stress 04/22/2013  . Nocturia 04/22/2013  . Pleural effusion    post surgical  . PVD  (peripheral vascular disease) (HCC)    S/P right CEA  . Sleep apnea   . Stroke [facility]) 2010    Past Surgical History:  Procedure Laterality Date  . APPENDECTOMY    . CARDIAC CATHETERIZATION  April 2010   Grafts were patent. EF is 40 to 45%  . CAROTID ENDARTERECTOMY     right  . CORONARY ARTERY BYPASS GRAFT  [id number]   Original surgery in 1997 x 5; Redo CABG x 3 in 2010 including free RIMA to distal LAD, SVG to OM and SVG to left posterolateral  . KNEE SURGERY     left  . LEFT HEART CATHETERIZATION WITH CORONARY/GRAFT ANGIOGRAM N/A 03/12/2013   Procedure: LEFT HEART CATHETERIZATION WITH Isabel Caprice;  Surgeon: Peter M Swaziland, MD;  Location: [facility] CATH LAB;  Service: Cardiovascular;  Laterality: N/A;  . PARTIAL COLECTOMY     due to colon abcess     Current Outpatient Prescriptions  Medication Sig Dispense Refill  . aspirin EC 81 MG tablet Take 1 tablet (81 mg total) by mouth daily. 90 tablet 3  . esomeprazole (NEXIUM) 40 MG capsule Take 40 mg by mouth daily at 12 noon.    . isosorbide mononitrate (IMDUR) 60 MG 24 hr tablet TAKE 1 TABLET (60 MG TOTAL) BY MOUTH DAILY. 90 tablet 2  . nebivolol (BYSTOLIC) 10 MG tablet Take 1 tablet (10 mg total) by mouth daily. 90 tablet 3  . nitroGLYCERIN (NITROSTAT) 0.4 MG SL tablet Place 1 tablet (0.4 mg total) under the tongue every 5 (five) minutes as needed. 25 tablet 4  . potassium chloride (MICRO-K) 10 MEQ CR capsule TAKE 1 CAPSULE (10 MEQ TOTAL) BY MOUTH DAILY. 90 capsule 2  . rosuvastatin (CRESTOR) 20 MG tablet TAKE 1 TABLET BY MOUTH EVERY DAY 30 tablet 7   No current facility-administered medications for this visit.     Allergies:   Metoprolol; Phenergan [promethazine hcl]; Prochlorperazine edisylate; Zetia [ezetimibe]; Compazine [prochlorperazine]; Sulfur; Niacin; and Sulfonamide derivatives    Social History:  The patient  reports that she has never smoked. She has never used smokeless tobacco. She reports that she does not  drink alcohol or use drugs.   Family History:  The patient's family history includes Allergies in her father; Dementia in her brother; Diabetes in her maternal grandmother, mother, paternal grandmother, and sister; Healthy in her sister; Heart attack in her father and mother; Heart disease in her mother and sister; Other in her brother, brother, and brother; Rheum arthritis in her mother; Stroke in her sister.    ROS:  Please see the history of  present illness.    Review of Systems: Constitutional:  denies fever, chills, diaphoresis, appetite change and fatigue.  HEENT: denies photophobia, eye pain, redness, hearing loss, ear pain, congestion, sore throat, rhinorrhea, sneezing, neck pain, neck stiffness and tinnitus.  Respiratory: denies SOB, DOE, cough, chest tightness, and wheezing.  Cardiovascular: denies chest pain, palpitations and leg swelling.  Gastrointestinal: denies nausea, vomiting, abdominal pain, diarrhea, constipation, blood in stool.  Genitourinary: denies dysuria, urgency, frequency, hematuria, flank pain and difficulty urinating.  Musculoskeletal: denies  myalgias, back pain, joint swelling, arthralgias and gait problem.   Skin: denies pallor, rash and wound.  Neurological: denies dizziness, seizures, syncope, weakness, light-headedness, numbness and headaches.   Hematological: denies adenopathy, easy bruising, personal or family bleeding history.  Psychiatric/ Behavioral: denies suicidal ideation, mood changes, confusion, nervousness, sleep disturbance and agitation.       All other systems are reviewed and negative.    PHYSICAL EXAM: VS:  BP 124/70   Pulse 66   Ht 5\' 5"  (1.651 m)   Wt 137 lb 12.8 oz (62.5 kg)   SpO2 98%   BMI 22.93 kg/m  , BMI Body mass index is 22.93 kg/m. GEN: Well nourished, well developed, in no acute distress  HEENT: normal  Neck: no JVD, carotid bruits, or masses Cardiac: RRR; no murmurs, rubs, or gallops,no edema  Respiratory:  clear  to auscultation bilaterally, normal work of breathing GI: soft, nontender, nondistended, + BS MS: no deformity or atrophy  Skin: warm and dry, no rash Neuro:  Strength and sensation are intact Psych: normal   EKG:  EKG is ordered today. The ekg ordered today demonstrates NSR at 68.    LBBB    Recent Labs: No results found for requested labs within last 8760 hours.    Lipid Panel    Component Value Date/Time   CHOL 161 03/25/2014 1039   TRIG 99.0 03/25/2014 1039   HDL 50.40 03/25/2014 1039   CHOLHDL 3 03/25/2014 1039   VLDL 19.8 03/25/2014 1039   LDLCALC 91 03/25/2014 1039      Wt Readings from Last 3 Encounters:  10/19/16 137 lb 12.8 oz (62.5 kg)  03/29/16 133 lb (60.3 kg)  07/29/15 135 lb (61.2 kg)      Other studies Reviewed: Additional studies/ records that were reviewed today include: . Review of the above records demonstrates:    ASSESSMENT AND PLAN:  1. Coronary artery disease:   No angina Has chronic dyspnea   2. Congestive heart failure - her last echo in June shows a normal EF .      3. Hyperlipidemia -  Following . Will likely check labs at her next visit    4. Peripheral vascular disease status post right carotid endarterectomy.  5. Hx of falls:  Walking with a cane   Current medicines are reviewed at length with the patient today.  The patient does not have concerns regarding medicines.  The following changes have been made:  no change   Disposition:   FU with me in 6 months     Signed, [name] MissPhilip Adabelle Griffiths, MD  10/19/2016 1:57 PM    [facility] [address], [place], [place]  [address] Phone: [phone]; Fax: [phone]

## 2016-10-20 LAB — COMPREHENSIVE METABOLIC PANEL
ALT: 10 IU/L (ref 0–32)
AST: 16 IU/L (ref 0–40)
Albumin/Globulin Ratio: 1.7 (ref 1.2–2.2)
Albumin: 4 g/dL (ref 3.5–4.7)
Alkaline Phosphatase: 69 IU/L (ref 39–117)
BUN/Creatinine Ratio: 21 (ref 12–28)
BUN: 15 mg/dL (ref 8–27)
Bilirubin Total: 0.3 mg/dL (ref 0.0–1.2)
CO2: 25 mmol/L (ref 18–29)
Calcium: 9.3 mg/dL (ref 8.7–10.3)
Chloride: 101 mmol/L (ref 96–106)
Creatinine, Ser: 0.73 mg/dL (ref 0.57–1.00)
GFR calc Af Amer: 87 mL/min/{1.73_m2} (ref >59)
GFR calc non Af Amer: 75 mL/min/{1.73_m2} (ref >59)
Globulin, Total: 2.3 g/dL (ref 1.5–4.5)
Glucose: 98 mg/dL (ref 65–99)
Potassium: 4.4 mmol/L (ref 3.5–5.2)
Sodium: 141 mmol/L (ref 134–144)
Total Protein: 6.3 g/dL (ref 6.0–8.5)

## 2016-10-20 LAB — LIPID PANEL
Chol/HDL Ratio: 2.5 ratio (ref 0.0–4.4)
Cholesterol, Total: 135 mg/dL (ref 100–199)
HDL: 54 mg/dL (ref >39)
LDL Calculated: 68 mg/dL (ref 0–99)
Triglycerides: 63 mg/dL (ref 0–149)
VLDL Cholesterol Cal: 13 mg/dL (ref 5–40)

## 2016-11-12 ENCOUNTER — Ambulatory Visit (INDEPENDENT_AMBULATORY_CARE_PROVIDER_SITE_OTHER): Payer: Medicare Other | Admitting: Family Medicine

## 2016-11-12 ENCOUNTER — Encounter: Payer: Self-pay | Admitting: Family Medicine

## 2016-11-12 VITALS — BP 130/58 | HR 82 | Temp 98.1°F | Resp 20 | Ht 65.5 in | Wt 136.0 lb

## 2016-11-12 DIAGNOSIS — M549 Dorsalgia, unspecified: Secondary | ICD-10-CM | POA: Diagnosis not present

## 2016-11-12 NOTE — Progress Notes (Signed)
Subjective:    Patient ID: [name], female    DOB: 1929-05-23, 83 y.o.   MRN: [id number]  HPI Patient reports 3 weeks of mid back pain. Patient denies any injury. Pain is located approximately the level of T8. She also has a slight forward leaning curvature of her spine. She states that it's almost impossible for her to straighten out. She denies any falls. She denies any numbness or tingling in her legs, weakness in her legs, her peripheral neuropathy. She denies any pleurisy. She denies any hemoptysis. Past Medical History:  Diagnosis Date  . Allergy   . Asthma   . CAD (coronary artery disease)    s/p CABG x 5 in 1997 & CABG x 3 in 2010; Last cath in April of 2010 showed grafts to be patent. EF is 40 to 45%  . CHF (congestive heart failure) (HCC)    EF is 40 to 45%  . Depression    following bypass surgery  . Diverticulosis   . Endometriosis   . GERD (gastroesophageal reflux disease)   . Hepatitis A   . Hiatal hernia   . History of cerebrovascular disease   . Hyperlipemia   . Hypertension   . LBP (low back pain)   . Mixed incontinence urge and stress 04/22/2013  . Nocturia 04/22/2013  . Pleural effusion    post surgical  . PVD (peripheral vascular disease) (HCC)    S/P right CEA  . Sleep apnea   . Stroke [facility]) 2010   Past Surgical History:  Procedure Laterality Date  . APPENDECTOMY    . CARDIAC CATHETERIZATION  April 2010   Grafts were patent. EF is 40 to 45%  . CAROTID ENDARTERECTOMY     right  . CORONARY ARTERY BYPASS GRAFT  4098,1191   Original surgery in 1997 x 5; Redo CABG x 3 in 2010 including free RIMA to distal LAD, SVG to OM and SVG to left posterolateral  . KNEE SURGERY     left  . LEFT HEART CATHETERIZATION WITH CORONARY/GRAFT ANGIOGRAM N/A 03/12/2013   Procedure: LEFT HEART CATHETERIZATION WITH Isabel Caprice;  Surgeon: Peter M Swaziland, MD;  Location: [facility] CATH LAB;  Service: Cardiovascular;  Laterality: N/A;  . PARTIAL COLECTOMY     due to  colon abcess   Current Outpatient Prescriptions on File Prior to Visit  Medication Sig Dispense Refill  . aspirin EC 81 MG tablet Take 1 tablet (81 mg total) by mouth daily. 90 tablet 3  . esomeprazole (NEXIUM) 40 MG capsule Take 40 mg by mouth daily at 12 noon.    . isosorbide mononitrate (IMDUR) 60 MG 24 hr tablet TAKE 1 TABLET (60 MG TOTAL) BY MOUTH DAILY. 90 tablet 2  . nebivolol (BYSTOLIC) 10 MG tablet Take 1 tablet (10 mg total) by mouth daily. 90 tablet 3  . nitroGLYCERIN (NITROSTAT) 0.4 MG SL tablet Place 1 tablet (0.4 mg total) under the tongue every 5 (five) minutes as needed. 25 tablet 4  . potassium chloride (MICRO-K) 10 MEQ CR capsule TAKE 1 CAPSULE (10 MEQ TOTAL) BY MOUTH DAILY. 90 capsule 2  . rosuvastatin (CRESTOR) 20 MG tablet TAKE 1 TABLET BY MOUTH EVERY DAY 30 tablet 7   No current facility-administered medications on file prior to visit.    Allergies  Allergen Reactions  . Metoprolol Other (See Comments)    allopoecia  . Phenergan [Promethazine Hcl] Other (See Comments)    Facial numbness  . Prochlorperazine Edisylate Anaphylaxis  . Zetia [  Ezetimibe] Nausea Only and Other (See Comments)    Abdominal pain  . Compazine [Prochlorperazine]     Facial  Paralysis       . Sulfur Other (See Comments)    paralyzes  face   . Niacin Rash  . Sulfonamide Derivatives Nausea And Vomiting   Social History   Social History  . Marital status: Married    Spouse name: N/A  . Number of children: 0  . Years of education: N/A   Occupational History  . retired Runner, broadcasting/film/videoteacher Retired   Social History Main Topics  . Smoking status: Never Smoker  . Smokeless tobacco: Never Used  . Alcohol use No  . Drug use: No  . Sexual activity: No   Other Topics Concern  . Not on file   Social History Narrative  . No narrative on file      Review of Systems  All other systems reviewed and are negative.      Objective:   Physical Exam  Cardiovascular: Normal rate, regular  rhythm and normal heart sounds.   Pulmonary/Chest: Effort normal and breath sounds normal. No respiratory distress. She has no wheezes. She has no rales.  Musculoskeletal:       Thoracic back: She exhibits decreased range of motion, tenderness, bony tenderness and pain. She exhibits no deformity.       Back:  Vitals reviewed.         Assessment & Plan:  Mid back pain - Plan: DG Thoracic Spine W/Swimmers  I'm concerned the patient may have a compression fracture in the thoracic spine given her age and frailty. Begin by obtaining x-rays of thoracic spine.

## 2016-11-13 ENCOUNTER — Ambulatory Visit
Admission: RE | Admit: 2016-11-13 | Discharge: 2016-11-13 | Disposition: A | Discharge status: Home / Self Care | Payer: Medicare Other | Source: Ambulatory Visit | Attending: Family Medicine | Admitting: Family Medicine

## 2016-11-13 DIAGNOSIS — M549 Dorsalgia, unspecified: Secondary | ICD-10-CM

## 2016-11-15 ENCOUNTER — Encounter: Payer: Self-pay | Admitting: Family Medicine

## 2017-02-14 ENCOUNTER — Other Ambulatory Visit: Payer: Self-pay | Admitting: Cardiovascular Disease

## 2017-02-28 ENCOUNTER — Ambulatory Visit (INDEPENDENT_AMBULATORY_CARE_PROVIDER_SITE_OTHER): Payer: Medicare Other | Admitting: Family Medicine

## 2017-02-28 DIAGNOSIS — Z23 Encounter for immunization: Secondary | ICD-10-CM

## 2017-03-22 ENCOUNTER — Other Ambulatory Visit: Payer: Self-pay | Admitting: Cardiovascular Disease

## 2017-04-23 ENCOUNTER — Ambulatory Visit: Payer: Self-pay | Admitting: Family Medicine

## 2017-04-23 ENCOUNTER — Encounter: Payer: Self-pay | Admitting: Family Medicine

## 2017-04-24 ENCOUNTER — Encounter: Payer: Self-pay | Admitting: Family Medicine

## 2017-04-24 ENCOUNTER — Ambulatory Visit: Payer: Medicare Other | Admitting: Family Medicine

## 2017-04-24 ENCOUNTER — Other Ambulatory Visit: Payer: Self-pay

## 2017-04-24 VITALS — BP 128/62 | HR 72 | Temp 98.1°F | Resp 18 | Ht 65.5 in | Wt 134.0 lb

## 2017-04-24 DIAGNOSIS — M791 Myalgia, unspecified site: Secondary | ICD-10-CM

## 2017-04-24 DIAGNOSIS — R0781 Pleurodynia: Secondary | ICD-10-CM | POA: Diagnosis not present

## 2017-04-24 DIAGNOSIS — M545 Low back pain, unspecified: Secondary | ICD-10-CM

## 2017-04-24 DIAGNOSIS — M25532 Pain in left wrist: Secondary | ICD-10-CM | POA: Diagnosis not present

## 2017-04-24 DIAGNOSIS — M25552 Pain in left hip: Secondary | ICD-10-CM | POA: Diagnosis not present

## 2017-04-24 DIAGNOSIS — M25531 Pain in right wrist: Secondary | ICD-10-CM

## 2017-04-24 DIAGNOSIS — W19XXXA Unspecified fall, initial encounter: Secondary | ICD-10-CM | POA: Diagnosis not present

## 2017-04-24 LAB — COMPREHENSIVE METABOLIC PANEL
AG Ratio: 1.7 (calc) (ref 1.0–2.5)
ALT: 11 U/L (ref 6–29)
AST: 18 U/L (ref 10–35)
Albumin: 4.2 g/dL (ref 3.6–5.1)
Alkaline phosphatase (APISO): 64 U/L (ref 33–130)
BUN: 13 mg/dL (ref 7–25)
CO2: 30 mmol/L (ref 20–32)
Calcium: 9.6 mg/dL (ref 8.6–10.4)
Chloride: 104 mmol/L (ref 98–110)
Creat: 0.88 mg/dL (ref 0.60–0.88)
Globulin: 2.5 g/dL (calc) (ref 1.9–3.7)
Glucose, Bld: 118 mg/dL — ABNORMAL HIGH (ref 65–99)
Potassium: 4.2 mmol/L (ref 3.5–5.3)
Sodium: 142 mmol/L (ref 135–146)
Total Bilirubin: 0.7 mg/dL (ref 0.2–1.2)
Total Protein: 6.7 g/dL (ref 6.1–8.1)

## 2017-04-24 LAB — CBC WITH DIFFERENTIAL/PLATELET
Basophils Absolute: 48 cells/uL (ref 0–200)
Basophils Relative: 0.7 %
Eosinophils Absolute: 41 cells/uL (ref 15–500)
Eosinophils Relative: 0.6 %
HCT: 40 % (ref 35.0–45.0)
Hemoglobin: 13.7 g/dL (ref 11.7–15.5)
Lymphs Abs: 2353 cells/uL (ref 850–3900)
MCH: 31.2 pg (ref 27.0–33.0)
MCHC: 34.3 g/dL (ref 32.0–36.0)
MCV: 91.1 fL (ref 80.0–100.0)
MPV: 9.7 fL (ref 7.5–12.5)
Monocytes Relative: 7.5 %
Neutro Abs: 3849 cells/uL (ref 1500–7800)
Neutrophils Relative %: 56.6 %
Platelets: 229 10*3/uL (ref 140–400)
RBC: 4.39 10*6/uL (ref 3.80–5.10)
RDW: 12.2 % (ref 11.0–15.0)
Total Lymphocyte: 34.6 %
WBC mixed population: 510 cells/uL (ref 200–950)
WBC: 6.8 10*3/uL (ref 3.8–10.8)

## 2017-04-24 LAB — CK: Total CK: 96 U/L (ref 29–143)

## 2017-04-24 MED ORDER — TRAMADOL HCL 50 MG PO TABS: 50.0000 mg | ORAL_TABLET | Freq: Two times a day (BID) | ORAL | 0 refills | Status: DC | PRN

## 2017-04-24 NOTE — Patient Instructions (Addendum)
Get the xrays done [address]  Take ultram twice a day for pain as needed  F/U pending results

## 2017-04-24 NOTE — Progress Notes (Signed)
Subjective:    Patient ID: [name], female    DOB: 23-May-1929, 83 y.o.   MRN: [id number]  Patient presents for Back Pain (x4 days- S/P fall- lost balance and fell in home neaar fireplace- was down >1 hour before able to get off floor- now having increased pain in lower back and B wrists)   Pt fell 4 days ago, lives alone. She was trying to get to the phone and lost her balance. Was stuck on floor for an hour. She does not know how she got up. She was sitting in a basket that was on the floor trying to get herself up. She has pain on right side of ribs and lower back, and wrist are sore putting weight on them.  She took some aleve which helps some   She has 2 nieces- Mertha BaarsMargaret Raines  Friend drove her today to appointment No chest pain, no SOB   Review Of Systems:  GEN- denies fatigue, fever, weight loss,weakness, recent illness HEENT- denies eye drainage, change in vision, nasal discharge, CVS- denies chest pain, palpitations RESP- denies SOB, cough, wheeze ABD- denies N/V, change in stools, abd pain GU- denies dysuria, hematuria, dribbling, incontinence MSK- denies joint pain, muscle aches, injury Neuro- denies headache, dizziness, syncope, seizure activity       Objective:    BP 128/62   Pulse 72   Temp 98.1 F (36.7 C) (Oral)   Resp 18   Ht 5' 5.5" (1.664 m)   Wt 134 lb (60.8 kg)   SpO2 97%   BMI 21.96 kg/m  GEN- NAD, alert and oriented x3 HEENT- PERRL, EOMI, non injected sclera, pink conjunctiva, MMM, oropharynx clear Neck- Supple, fair ROM CVS- RRR, no murmur RESP-CTAB ABD-NABS,soft,NT,ND MSK- TTP right lower ribs, bilat wrist, good ROM wrist, no bruising, no bony abnormality on inspection, TTP lumbar spine, hips, decreaed ROM, no click, slow gait EXT- No edema Pulses- Radial, 2+        Assessment & Plan:      Problem List Items Addressed This Visit    None    Visit Diagnoses    Fall, initial encounter    -  Primary   Fall, with prolonged time  down on floor. No sign of infection. Best case scenario just bruising and muscle fatigue from trying to get up off the floor. But with age and frailty.obtan xrays of areas of concern on exam. Check CK and renal function Discussed with niece who will take patient to get imaging, given Ultram 50mg  BID for 2 weeks only, nurse discussed medication with niece. Tylenol/aleve not helping pain. Discussed getting her a life alert bracelet she lives alone, husband passed away. She does not have any children. Given info on this   Relevant Orders   CK (Completed)   CBC with Differential/Platelet (Completed)   Comprehensive metabolic panel (Completed)   DG Lumbar Spine Complete   DG Ribs Unilateral Right   DG HIPS BILAT WITH PELVIS 3-4 VIEWS   DG Wrist Complete Right   Myalgia       Relevant Orders   CK (Completed)   CBC with Differential/Platelet (Completed)   Comprehensive metabolic panel (Completed)   Acute midline low back pain without sciatica       Relevant Medications   traMADol (ULTRAM) 50 MG tablet   Other Relevant Orders   DG Lumbar Spine Complete   DG HIPS BILAT WITH PELVIS 3-4 VIEWS   Rib pain on right side  Relevant Orders   DG Ribs Unilateral Right   Bilateral wrist pain       Relevant Orders   DG Wrist Complete Right   Left hip pain       Relevant Orders   DG HIPS BILAT WITH PELVIS 3-4 VIEWS      Note: This dictation was prepared with Dragon dictation along with smaller phrase technology. Any transcriptional errors that result from this process are unintentional.

## 2017-04-25 ENCOUNTER — Encounter: Payer: Self-pay | Admitting: Family Medicine

## 2017-04-26 ENCOUNTER — Ambulatory Visit
Admission: RE | Admit: 2017-04-26 | Discharge: 2017-04-26 | Disposition: A | Discharge status: Home / Self Care | Payer: Medicare Other | Source: Ambulatory Visit | Attending: Family Medicine | Admitting: Family Medicine

## 2017-04-26 DIAGNOSIS — M545 Low back pain, unspecified: Secondary | ICD-10-CM

## 2017-04-26 DIAGNOSIS — R0781 Pleurodynia: Secondary | ICD-10-CM

## 2017-04-26 DIAGNOSIS — M25531 Pain in right wrist: Secondary | ICD-10-CM

## 2017-04-26 DIAGNOSIS — M25552 Pain in left hip: Secondary | ICD-10-CM

## 2017-04-26 DIAGNOSIS — W19XXXA Unspecified fall, initial encounter: Secondary | ICD-10-CM

## 2017-04-26 DIAGNOSIS — M25532 Pain in left wrist: Secondary | ICD-10-CM

## 2017-04-29 ENCOUNTER — Telehealth: Payer: Self-pay | Admitting: Family Medicine

## 2017-04-29 NOTE — Telephone Encounter (Signed)
Spoke with patient and patient niece last week with results.

## 2017-04-29 NOTE — Telephone Encounter (Signed)
Calling about xray results form last week. Dr Jeanice Limurham ordered.

## 2017-08-12 ENCOUNTER — Other Ambulatory Visit: Payer: Self-pay

## 2017-08-12 MED ORDER — POTASSIUM CHLORIDE ER 10 MEQ PO CPCR: 10.0000 meq | ORAL_CAPSULE | Freq: Every day | ORAL | 0 refills | Status: DC

## 2017-08-15 ENCOUNTER — Other Ambulatory Visit: Payer: Self-pay | Admitting: Cardiovascular Disease

## 2017-09-03 ENCOUNTER — Emergency Department (HOSPITAL_COMMUNITY): Payer: Medicare Other

## 2017-09-03 ENCOUNTER — Inpatient Hospital Stay (HOSPITAL_COMMUNITY)
Admission: EM | Admit: 2017-09-03 | Discharge: 2017-09-10 | DRG: 184 | Disposition: A | Discharge status: Skilled Nursing Facility | Payer: Medicare Other | Attending: Surgery | Admitting: Surgery

## 2017-09-03 ENCOUNTER — Encounter (HOSPITAL_COMMUNITY): Payer: Self-pay | Admitting: Pharmacy Technician

## 2017-09-03 DIAGNOSIS — E785 Hyperlipidemia, unspecified: Secondary | ICD-10-CM | POA: Diagnosis present

## 2017-09-03 DIAGNOSIS — Z8673 Personal history of transient ischemic attack (TIA), and cerebral infarction without residual deficits: Secondary | ICD-10-CM

## 2017-09-03 DIAGNOSIS — K219 Gastro-esophageal reflux disease without esophagitis: Secondary | ICD-10-CM | POA: Diagnosis present

## 2017-09-03 DIAGNOSIS — G4730 Sleep apnea, unspecified: Secondary | ICD-10-CM | POA: Diagnosis present

## 2017-09-03 DIAGNOSIS — Z823 Family history of stroke: Secondary | ICD-10-CM

## 2017-09-03 DIAGNOSIS — N3946 Mixed incontinence: Secondary | ICD-10-CM | POA: Diagnosis present

## 2017-09-03 DIAGNOSIS — S2249XA Multiple fractures of ribs, unspecified side, initial encounter for closed fracture: Secondary | ICD-10-CM | POA: Diagnosis not present

## 2017-09-03 DIAGNOSIS — E440 Moderate protein-calorie malnutrition: Secondary | ICD-10-CM

## 2017-09-03 DIAGNOSIS — I5032 Chronic diastolic (congestive) heart failure: Secondary | ICD-10-CM | POA: Diagnosis present

## 2017-09-03 DIAGNOSIS — I4891 Unspecified atrial fibrillation: Secondary | ICD-10-CM | POA: Diagnosis not present

## 2017-09-03 DIAGNOSIS — R4182 Altered mental status, unspecified: Secondary | ICD-10-CM | POA: Diagnosis not present

## 2017-09-03 DIAGNOSIS — S2232XA Fracture of one rib, left side, initial encounter for closed fracture: Secondary | ICD-10-CM

## 2017-09-03 DIAGNOSIS — W01198A Fall on same level from slipping, tripping and stumbling with subsequent striking against other object, initial encounter: Secondary | ICD-10-CM | POA: Diagnosis present

## 2017-09-03 DIAGNOSIS — Z9049 Acquired absence of other specified parts of digestive tract: Secondary | ICD-10-CM

## 2017-09-03 DIAGNOSIS — Z79899 Other long term (current) drug therapy: Secondary | ICD-10-CM

## 2017-09-03 DIAGNOSIS — J9811 Atelectasis: Secondary | ICD-10-CM | POA: Diagnosis present

## 2017-09-03 DIAGNOSIS — Z9181 History of falling: Secondary | ICD-10-CM

## 2017-09-03 DIAGNOSIS — Z882 Allergy status to sulfonamides status: Secondary | ICD-10-CM

## 2017-09-03 DIAGNOSIS — Z888 Allergy status to other drugs, medicaments and biological substances status: Secondary | ICD-10-CM

## 2017-09-03 DIAGNOSIS — Z8249 Family history of ischemic heart disease and other diseases of the circulatory system: Secondary | ICD-10-CM

## 2017-09-03 DIAGNOSIS — I2510 Atherosclerotic heart disease of native coronary artery without angina pectoris: Secondary | ICD-10-CM | POA: Diagnosis present

## 2017-09-03 DIAGNOSIS — S2242XA Multiple fractures of ribs, left side, initial encounter for closed fracture: Principal | ICD-10-CM | POA: Diagnosis present

## 2017-09-03 DIAGNOSIS — I480 Paroxysmal atrial fibrillation: Secondary | ICD-10-CM

## 2017-09-03 DIAGNOSIS — Z79891 Long term (current) use of opiate analgesic: Secondary | ICD-10-CM

## 2017-09-03 DIAGNOSIS — Z951 Presence of aortocoronary bypass graft: Secondary | ICD-10-CM

## 2017-09-03 DIAGNOSIS — R000 Tachycardia, unspecified: Secondary | ICD-10-CM

## 2017-09-03 DIAGNOSIS — I739 Peripheral vascular disease, unspecified: Secondary | ICD-10-CM | POA: Diagnosis present

## 2017-09-03 DIAGNOSIS — Z7982 Long term (current) use of aspirin: Secondary | ICD-10-CM

## 2017-09-03 DIAGNOSIS — N390 Urinary tract infection, site not specified: Secondary | ICD-10-CM | POA: Diagnosis not present

## 2017-09-03 DIAGNOSIS — I110 Hypertensive heart disease with heart failure: Secondary | ICD-10-CM | POA: Diagnosis present

## 2017-09-03 LAB — I-STAT CHEM 8, ED
BUN: 13 mg/dL (ref 6–20)
Calcium, Ion: 1.15 mmol/L (ref 1.15–1.40)
Chloride: 102 mmol/L (ref 101–111)
Creatinine, Ser: 0.8 mg/dL (ref 0.44–1.00)
Glucose, Bld: 152 mg/dL — ABNORMAL HIGH (ref 65–99)
HCT: 35 % — ABNORMAL LOW (ref 36.0–46.0)
Hemoglobin: 11.9 g/dL — ABNORMAL LOW (ref 12.0–15.0)
Potassium: 4.2 mmol/L (ref 3.5–5.1)
Sodium: 138 mmol/L (ref 135–145)
TCO2: 25 mmol/L (ref 22–32)

## 2017-09-03 LAB — COMPREHENSIVE METABOLIC PANEL
ALT: 14 U/L (ref 14–54)
AST: 20 U/L (ref 15–41)
Albumin: 3.6 g/dL (ref 3.5–5.0)
Alkaline Phosphatase: 61 U/L (ref 38–126)
Anion gap: 9 (ref 5–15)
BUN: 13 mg/dL (ref 6–20)
CO2: 25 mmol/L (ref 22–32)
Calcium: 9 mg/dL (ref 8.9–10.3)
Chloride: 103 mmol/L (ref 101–111)
Creatinine, Ser: 0.87 mg/dL (ref 0.44–1.00)
GFR calc Af Amer: >60 mL/min (ref >60)
GFR calc non Af Amer: 59 mL/min — ABNORMAL LOW (ref >60)
Glucose, Bld: 153 mg/dL — ABNORMAL HIGH (ref 65–99)
Potassium: 4.2 mmol/L (ref 3.5–5.1)
Sodium: 137 mmol/L (ref 135–145)
Total Bilirubin: 0.5 mg/dL (ref 0.3–1.2)
Total Protein: 6.2 g/dL — ABNORMAL LOW (ref 6.5–8.1)

## 2017-09-03 LAB — CBC
HCT: 35.9 % — ABNORMAL LOW (ref 36.0–46.0)
Hemoglobin: 11.8 g/dL — ABNORMAL LOW (ref 12.0–15.0)
MCH: 30.7 pg (ref 26.0–34.0)
MCHC: 32.9 g/dL (ref 30.0–36.0)
MCV: 93.5 fL (ref 78.0–100.0)
Platelets: 189 10*3/uL (ref 150–400)
RBC: 3.84 MIL/uL — ABNORMAL LOW (ref 3.87–5.11)
RDW: 12.8 % (ref 11.5–15.5)
WBC: 14.4 10*3/uL — ABNORMAL HIGH (ref 4.0–10.5)

## 2017-09-03 LAB — CBG MONITORING, ED: Glucose-Capillary: 147 mg/dL — ABNORMAL HIGH (ref 65–99)

## 2017-09-03 MED ORDER — IOPAMIDOL (ISOVUE-300) INJECTION 61%
INTRAVENOUS | Status: AC
  Filled 2017-09-03: qty 100

## 2017-09-03 MED ORDER — IOPAMIDOL (ISOVUE-300) INJECTION 61%
100.0000 mL | Freq: Once | INTRAVENOUS | Status: AC | PRN
  Administered 2017-09-03: 100 mL via INTRAVENOUS

## 2017-09-03 NOTE — ED Notes (Signed)
Xray bedside.

## 2017-09-03 NOTE — ED Notes (Signed)
Patient transported to CT 

## 2017-09-03 NOTE — ED Triage Notes (Signed)
Pt arrives via EMS from home with reports of unwitnessed fall. Pt with carpeted floors, lost her balance and fell. Pt reports hitting something on the way down, but unable to give a full account of what she hit or what hurts. Pt with L ribcage tenderness with painful respirations. Pt arrives with ccollar in place and on 4L [place]. 178/76, HR 86, RR 20, 91% RA with ems. Pt was given 250mcg fentanyl and 8mg  zofran.

## 2017-09-03 NOTE — ED Provider Notes (Signed)
[facility] EMERGENCY DEPARTMENT Provider Note   CSN: [id number] Arrival date & time: 09/03/17  2029     History   Chief Complaint No chief complaint on file.   HPI [name] is a 84 y.o. female.  84 year old female with past medical history including hypertension, CHF, CAD s/p CABG, CVA, PVD who p/w fall. Tonight just prior to arrival, she lost her balance and had an unwitnessed fall.  She hit something with her left side and also hit her head.  She states she did not lose consciousness.  She reports pain all over her body, particularly in her left chest wall.  Her pain is severe and constant.  She received a total of 250 mcg fentanyl and 8 mg of Zofran by EMS.  She denies any abdominal pain or breathing problems.  No anticoagulant use.  The history is provided by the patient.    Past Medical History:  Diagnosis Date  . Allergy   . Asthma   . CAD (coronary artery disease)    s/p CABG x 5 in 1997 & CABG x 3 in 2010; Last cath in April of 2010 showed grafts to be patent. EF is 40 to 45%  . CHF (congestive heart failure) (HCC)    EF is 40 to 45%  . Depression    following bypass surgery  . Diverticulosis   . Endometriosis   . GERD (gastroesophageal reflux disease)   . Hepatitis A   . Hiatal hernia   . History of cerebrovascular disease   . History of vertebral fracture    at T8  . Hyperlipemia   . Hypertension   . LBP (low back pain)   . Mixed incontinence urge and stress 04/22/2013  . Nocturia 04/22/2013  . Pleural effusion    post surgical  . PVD (peripheral vascular disease) (HCC)    S/P right CEA  . Sleep apnea   . Stroke [facility]) 2010    Patient Active Problem List   Diagnosis Date Noted  . Dyspnea 01/21/2014  . Chronic diastolic CHF (congestive heart failure) (HCC) 12/15/2013  . Cerebral vascular disease 10/13/2013  . Mixed incontinence urge and stress 04/22/2013  . Nocturia 04/22/2013  . Angina decubitus (HCC) 03/12/2013  . GERD  (gastroesophageal reflux disease) 08/16/2011  . Other general symptoms  08/16/2011  . Fatigue 09/04/2010  . HEPATITIS, ACUTE 07/12/2009  . AMI 07/12/2009  . ESOPHAGEAL REFLUX 07/12/2009  . DIVERTICULOSIS, COLON 07/12/2009  . CHEST PAIN 07/12/2009  . SLEEP APNEA 10/23/2007  . Essential hypertension 09/18/2007  . CAD (coronary artery disease) 09/18/2007  . CHEST PAIN, ATYPICAL 09/18/2007    Past Surgical History:  Procedure Laterality Date  . APPENDECTOMY    . CARDIAC CATHETERIZATION  April 2010   Grafts were patent. EF is 40 to 45%  . CAROTID ENDARTERECTOMY     right  . CORONARY ARTERY BYPASS GRAFT  4098,1191   Original surgery in 1997 x 5; Redo CABG x 3 in 2010 including free RIMA to distal LAD, SVG to OM and SVG to left posterolateral  . KNEE SURGERY     left  . LEFT HEART CATHETERIZATION WITH CORONARY/GRAFT ANGIOGRAM N/A 03/12/2013   Procedure: LEFT HEART CATHETERIZATION WITH Isabel Caprice;  Surgeon: Peter M Swaziland, MD;  Location: [facility] CATH LAB;  Service: Cardiovascular;  Laterality: N/A;  . PARTIAL COLECTOMY     due to colon abcess     OB History   None      Home  Medications    Prior to Admission medications   Medication Sig Start Date End Date Taking? Authorizing Provider  aspirin EC 81 MG tablet Take 1 tablet (81 mg total) by mouth daily. 12/15/13  Yes Nahser, Deloris Ping, MD  BYSTOLIC 10 MG tablet TAKE 1 TABLET BY MOUTH EVERY DAY Patient taking differently: TAKE 10 mg TABLET BY MOUTH EVERY DAY 03/22/17  Yes Nahser, Deloris Ping, MD  esomeprazole (NEXIUM) 20 MG capsule Take 20 mg by mouth daily at 12 noon.    Yes [provider]  isosorbide mononitrate (IMDUR) 60 MG 24 hr tablet Take 1 tablet (60 mg total) by mouth daily. Please make yearly appt with Dr. Elease Hashimoto for May before anymore refills. 1st attempt 08/15/17  Yes Nahser, Deloris Ping, MD  nitroGLYCERIN (NITROSTAT) 0.4 MG SL tablet Place 1 tablet (0.4 mg total) under the tongue every 5 (five) minutes as  needed. 10/15/16  Yes Nahser, Deloris Ping, MD  potassium chloride (MICRO-K) 10 MEQ CR capsule Take 1 capsule (10 mEq total) by mouth daily. Please call [phone] to schedule appointment for additional refills thanks. 08/12/17  Yes Nahser, Deloris Ping, MD  rosuvastatin (CRESTOR) 20 MG tablet TAKE 1 TABLET BY MOUTH EVERY DAY Patient taking differently: TAKE 20 mg  TABLET BY MOUTH EVERY DAY 02/14/17  Yes Nahser, Deloris Ping, MD  traMADol (ULTRAM) 50 MG tablet Take 1 tablet (50 mg total) by mouth 2 (two) times daily as needed. Patient not taking: Reported on 09/03/2017 04/24/17   Salley Scarlet, MD    Family History Family History  Problem Relation Age of Onset  . Heart attack Mother   . Diabetes Mother   . Heart disease Mother   . Rheum arthritis Mother   . Heart attack Father   . Allergies Father   . Diabetes Sister   . Stroke Sister   . Heart disease Sister   . Dementia Brother   . Healthy Sister   . Other Brother        LUNG ISSUES  . Other Brother        LUNG ISSUES  . Other Brother        LUNG ISSUES  . Diabetes Maternal Grandmother   . Diabetes Paternal Grandmother   . Colon cancer Neg Hx     Social History Social History   Tobacco Use  . Smoking status: Never Smoker  . Smokeless tobacco: Never Used  Substance Use Topics  . Alcohol use: No    Alcohol/week: 0.0 oz  . Drug use: No     Allergies   Metoprolol; Phenergan [promethazine hcl]; Prochlorperazine edisylate; Zetia [ezetimibe]; Compazine [prochlorperazine]; Sulfur; Niacin; and Sulfonamide derivatives   Review of Systems Review of Systems All other systems reviewed and are negative except that which was mentioned in HPI   Physical Exam Updated Vital Signs BP (!) 111/41   Pulse 73   Resp 12   SpO2 94%   Physical Exam  Constitutional: She is oriented to person, place, and time. She appears well-developed and well-nourished. No distress.  uncomfortable  HENT:  Head: Normocephalic and atraumatic.  Moist  mucous membranes  Eyes: Pupils are equal, round, and reactive to light. Conjunctivae are normal.  Neck:  uncomfortable  Cardiovascular: Normal rate, regular rhythm and normal heart sounds.  No murmur heard. Pulmonary/Chest: Effort normal and breath sounds normal. She exhibits tenderness (left lower anterior to lateral chest wall).  No crepitus  Abdominal: Soft. Bowel sounds are normal. She exhibits no distension. There is  no tenderness.  Musculoskeletal: Normal range of motion. She exhibits no edema or deformity.  Neurological: She is alert and oriented to person, place, and time.  Fluent speech  Skin: Skin is warm and dry.  Psychiatric: She has a normal mood and affect.  Nursing note and vitals reviewed.    ED Treatments / Results  Labs (all labs ordered are listed, but only abnormal results are displayed) Labs Reviewed  COMPREHENSIVE METABOLIC PANEL - Abnormal; Notable for the following components:      Result Value   Glucose, Bld 153 (*)    Total Protein 6.2 (*)    GFR calc non Af Amer 59 (*)    All other components within normal limits  CBC - Abnormal; Notable for the following components:   WBC 14.4 (*)    RBC 3.84 (*)    Hemoglobin 11.8 (*)    HCT 35.9 (*)    All other components within normal limits  CBG MONITORING, ED - Abnormal; Notable for the following components:   Glucose-Capillary 147 (*)    All other components within normal limits  I-STAT CHEM 8, ED - Abnormal; Notable for the following components:   Glucose, Bld 152 (*)    Hemoglobin 11.9 (*)    HCT 35.0 (*)    All other components within normal limits    EKG EKG Interpretation  Date/Time:  Tuesday September 03 2017 20:48:36 EDT Ventricular Rate:  72 PR Interval:    QRS Duration: 144 QT Interval:  465 QTC Calculation: 509 R Axis:   -63 Text Interpretation:  Sinus rhythm Nonspecific IVCD with LAD LVH with secondary repolarization abnormality Anterior infarct, old similar to previous Confirmed by  Frederick PeersLittle, Rachel [phone]([id number]) on 09/03/2017 9:57:32 PM   Radiology Dg Chest Port 1 View  Result Date: 09/03/2017 CLINICAL DATA:  Fall today.  LEFT chest wall pain.  History of CHF. EXAM: PORTABLE CHEST 1 VIEW COMPARISON:  Chest radiograph April 26, 2017. FINDINGS: Cardiac silhouette is mildly enlarged and unchanged. Status post median sternotomy for CABG. Heavily calcified aortic knob. Mild chronic interstitial changes. No pneumothorax. Osteopenia. Single level in the thoracic vertebral body cement augmentation. Slight cortical irregularity LEFT lateral seventh rib. IMPRESSION: 1. Potential nondisplaced LEFT seventh rib fracture. 2. Stable cardiomegaly and chronic interstitial changes. 3.  Aortic Atherosclerosis (ICD10-I70.0). Electronically Signed   By: Awilda Metroourtnay  Bloomer M.D.   On: 09/03/2017 22:07    Procedures Procedures (including critical care time)  Medications Ordered in ED Medications  iopamidol (ISOVUE-300) 61 % injection (has no administration in time range)     Initial Impression / Assessment and Plan / ED Course  I have reviewed the triage vital signs and the nursing notes.  Pertinent labs & imaging results that were available during my care of the patient were reviewed by me and considered in my medical decision making (see chart for details).    Pt w/ reassuring VS on exam. L chest wall tenderness without deformity or bruising. Labs show WBC 14.4, Hgb 11.8. CXR w/ possible L 7th rib fx. Obtained CT head through pelvis given multiple areas of pain and advanced age.  I am signing out to the oncoming provider, Dr. Wilkie AyeHorton, who will follow up on her imaging results. Final Clinical Impressions(s) / ED Diagnoses   Final diagnoses:  None    ED Discharge Orders    None       Little, Ambrose Finlandachel [facility], MD 09/04/17 [phone]

## 2017-09-04 ENCOUNTER — Inpatient Hospital Stay (HOSPITAL_COMMUNITY): Payer: Medicare Other

## 2017-09-04 DIAGNOSIS — Z951 Presence of aortocoronary bypass graft: Secondary | ICD-10-CM | POA: Diagnosis not present

## 2017-09-04 DIAGNOSIS — I2510 Atherosclerotic heart disease of native coronary artery without angina pectoris: Secondary | ICD-10-CM | POA: Diagnosis present

## 2017-09-04 DIAGNOSIS — Z9181 History of falling: Secondary | ICD-10-CM | POA: Diagnosis not present

## 2017-09-04 DIAGNOSIS — S2249XA Multiple fractures of ribs, unspecified side, initial encounter for closed fracture: Secondary | ICD-10-CM | POA: Diagnosis present

## 2017-09-04 DIAGNOSIS — Z7982 Long term (current) use of aspirin: Secondary | ICD-10-CM | POA: Diagnosis not present

## 2017-09-04 DIAGNOSIS — N390 Urinary tract infection, site not specified: Secondary | ICD-10-CM | POA: Diagnosis not present

## 2017-09-04 DIAGNOSIS — J9811 Atelectasis: Secondary | ICD-10-CM | POA: Diagnosis present

## 2017-09-04 DIAGNOSIS — I10 Essential (primary) hypertension: Secondary | ICD-10-CM | POA: Diagnosis not present

## 2017-09-04 DIAGNOSIS — S2242XA Multiple fractures of ribs, left side, initial encounter for closed fracture: Secondary | ICD-10-CM | POA: Diagnosis present

## 2017-09-04 DIAGNOSIS — I5032 Chronic diastolic (congestive) heart failure: Secondary | ICD-10-CM | POA: Diagnosis present

## 2017-09-04 DIAGNOSIS — Z8673 Personal history of transient ischemic attack (TIA), and cerebral infarction without residual deficits: Secondary | ICD-10-CM | POA: Diagnosis not present

## 2017-09-04 DIAGNOSIS — I480 Paroxysmal atrial fibrillation: Secondary | ICD-10-CM | POA: Diagnosis not present

## 2017-09-04 DIAGNOSIS — I739 Peripheral vascular disease, unspecified: Secondary | ICD-10-CM | POA: Diagnosis present

## 2017-09-04 DIAGNOSIS — Z79899 Other long term (current) drug therapy: Secondary | ICD-10-CM | POA: Diagnosis not present

## 2017-09-04 DIAGNOSIS — Z823 Family history of stroke: Secondary | ICD-10-CM | POA: Diagnosis not present

## 2017-09-04 DIAGNOSIS — W01198A Fall on same level from slipping, tripping and stumbling with subsequent striking against other object, initial encounter: Secondary | ICD-10-CM | POA: Diagnosis present

## 2017-09-04 DIAGNOSIS — I4891 Unspecified atrial fibrillation: Secondary | ICD-10-CM | POA: Diagnosis not present

## 2017-09-04 DIAGNOSIS — G4730 Sleep apnea, unspecified: Secondary | ICD-10-CM | POA: Diagnosis present

## 2017-09-04 DIAGNOSIS — I110 Hypertensive heart disease with heart failure: Secondary | ICD-10-CM | POA: Diagnosis present

## 2017-09-04 DIAGNOSIS — R4182 Altered mental status, unspecified: Secondary | ICD-10-CM | POA: Diagnosis not present

## 2017-09-04 DIAGNOSIS — Z8249 Family history of ischemic heart disease and other diseases of the circulatory system: Secondary | ICD-10-CM | POA: Diagnosis not present

## 2017-09-04 DIAGNOSIS — E785 Hyperlipidemia, unspecified: Secondary | ICD-10-CM | POA: Diagnosis present

## 2017-09-04 DIAGNOSIS — Z882 Allergy status to sulfonamides status: Secondary | ICD-10-CM | POA: Diagnosis not present

## 2017-09-04 DIAGNOSIS — Z79891 Long term (current) use of opiate analgesic: Secondary | ICD-10-CM | POA: Diagnosis not present

## 2017-09-04 DIAGNOSIS — K219 Gastro-esophageal reflux disease without esophagitis: Secondary | ICD-10-CM | POA: Diagnosis present

## 2017-09-04 DIAGNOSIS — Z888 Allergy status to other drugs, medicaments and biological substances status: Secondary | ICD-10-CM | POA: Diagnosis not present

## 2017-09-04 DIAGNOSIS — R000 Tachycardia, unspecified: Secondary | ICD-10-CM | POA: Diagnosis not present

## 2017-09-04 DIAGNOSIS — N3946 Mixed incontinence: Secondary | ICD-10-CM | POA: Diagnosis present

## 2017-09-04 DIAGNOSIS — Z9049 Acquired absence of other specified parts of digestive tract: Secondary | ICD-10-CM | POA: Diagnosis not present

## 2017-09-04 LAB — BASIC METABOLIC PANEL
Anion gap: 11 (ref 5–15)
BUN: 11 mg/dL (ref 6–20)
CO2: 22 mmol/L (ref 22–32)
Calcium: 9 mg/dL (ref 8.9–10.3)
Chloride: 104 mmol/L (ref 101–111)
Creatinine, Ser: 0.87 mg/dL (ref 0.44–1.00)
GFR calc Af Amer: >60 mL/min (ref >60)
GFR calc non Af Amer: 59 mL/min — ABNORMAL LOW (ref >60)
Glucose, Bld: 124 mg/dL — ABNORMAL HIGH (ref 65–99)
Potassium: 4.7 mmol/L (ref 3.5–5.1)
Sodium: 137 mmol/L (ref 135–145)

## 2017-09-04 LAB — CBC
HCT: 34.5 % — ABNORMAL LOW (ref 36.0–46.0)
Hemoglobin: 11 g/dL — ABNORMAL LOW (ref 12.0–15.0)
MCH: 29.9 pg (ref 26.0–34.0)
MCHC: 31.9 g/dL (ref 30.0–36.0)
MCV: 93.8 fL (ref 78.0–100.0)
Platelets: 173 10*3/uL (ref 150–400)
RBC: 3.68 MIL/uL — ABNORMAL LOW (ref 3.87–5.11)
RDW: 12.8 % (ref 11.5–15.5)
WBC: 10.9 10*3/uL — ABNORMAL HIGH (ref 4.0–10.5)

## 2017-09-04 MED ORDER — ENOXAPARIN SODIUM 30 MG/0.3ML ~~LOC~~ SOLN
30.0000 mg | SUBCUTANEOUS | Status: DC
  Administered 2017-09-04 – 2017-09-10 (×7): 30 mg via SUBCUTANEOUS
  Filled 2017-09-04 (×9): qty 0.3

## 2017-09-04 MED ORDER — MORPHINE SULFATE (PF) 4 MG/ML IV SOLN: 3.0000 mg | INTRAVENOUS | Status: DC | PRN

## 2017-09-04 MED ORDER — ACETAMINOPHEN 500 MG PO TABS
1000.0000 mg | ORAL_TABLET | Freq: Three times a day (TID) | ORAL | Status: DC
  Administered 2017-09-04 – 2017-09-10 (×17): 1000 mg via ORAL
  Filled 2017-09-04 (×18): qty 2

## 2017-09-04 MED ORDER — FENTANYL CITRATE (PF) 100 MCG/2ML IJ SOLN
50.0000 ug | Freq: Once | INTRAMUSCULAR | Status: AC
  Administered 2017-09-04: 50 ug via INTRAVENOUS
  Filled 2017-09-04: qty 2

## 2017-09-04 MED ORDER — MORPHINE SULFATE (PF) 4 MG/ML IV SOLN: 2.0000 mg | INTRAVENOUS | Status: DC | PRN

## 2017-09-04 MED ORDER — ONDANSETRON HCL 4 MG/2ML IJ SOLN
4.0000 mg | Freq: Four times a day (QID) | INTRAMUSCULAR | Status: DC | PRN
  Administered 2017-09-05: 4 mg via INTRAVENOUS
  Filled 2017-09-04: qty 2

## 2017-09-04 MED ORDER — METHOCARBAMOL 500 MG PO TABS
500.0000 mg | ORAL_TABLET | Freq: Three times a day (TID) | ORAL | Status: DC
  Administered 2017-09-04 – 2017-09-08 (×14): 500 mg via ORAL
  Filled 2017-09-04 (×16): qty 1

## 2017-09-04 MED ORDER — MORPHINE SULFATE (PF) 4 MG/ML IV SOLN: 1.0000 mg | INTRAVENOUS | Status: DC | PRN

## 2017-09-04 MED ORDER — OXYCODONE HCL 5 MG PO TABS
5.0000 mg | ORAL_TABLET | ORAL | Status: DC | PRN
  Administered 2017-09-04: 5 mg via ORAL
  Administered 2017-09-05: 10 mg via ORAL
  Administered 2017-09-06: 5 mg via ORAL
  Filled 2017-09-04 (×2): qty 1
  Filled 2017-09-04: qty 2

## 2017-09-04 MED ORDER — TRAMADOL HCL 50 MG PO TABS: 50.0000 mg | ORAL_TABLET | Freq: Four times a day (QID) | ORAL | Status: DC | PRN

## 2017-09-04 MED ORDER — KETOROLAC TROMETHAMINE 15 MG/ML IJ SOLN
15.0000 mg | Freq: Once | INTRAMUSCULAR | Status: AC
Start: 2017-09-04 — End: 2017-09-04
  Administered 2017-09-04: 15 mg via INTRAVENOUS
  Filled 2017-09-04: qty 1

## 2017-09-04 MED ORDER — POTASSIUM CHLORIDE IN NACL 20-0.9 MEQ/L-% IV SOLN
INTRAVENOUS | Status: DC
  Administered 2017-09-04 – 2017-09-05 (×2): via INTRAVENOUS
  Filled 2017-09-04 (×2): qty 1000

## 2017-09-04 MED ORDER — HYDROCODONE-ACETAMINOPHEN 5-325 MG PO TABS: 2.0000 | ORAL_TABLET | ORAL | Status: DC | PRN

## 2017-09-04 MED ORDER — ONDANSETRON 4 MG PO TBDP: 4.0000 mg | ORAL_TABLET | Freq: Four times a day (QID) | ORAL | Status: DC | PRN

## 2017-09-04 MED ORDER — ISOSORBIDE MONONITRATE ER 60 MG PO TB24
60.0000 mg | ORAL_TABLET | Freq: Every day | ORAL | Status: DC
  Administered 2017-09-04 – 2017-09-10 (×6): 60 mg via ORAL
  Filled 2017-09-04: qty 2
  Filled 2017-09-04: qty 1
  Filled 2017-09-04 (×2): qty 2
  Filled 2017-09-04 (×3): qty 1

## 2017-09-04 MED ORDER — MORPHINE SULFATE (PF) 4 MG/ML IV SOLN
1.0000 mg | INTRAVENOUS | Status: DC | PRN
  Administered 2017-09-05: 2 mg via INTRAVENOUS
  Administered 2017-09-05: 3 mg via INTRAVENOUS
  Filled 2017-09-04: qty 1

## 2017-09-04 MED ORDER — HYDROCODONE-ACETAMINOPHEN 5-325 MG PO TABS: 1.0000 | ORAL_TABLET | ORAL | Status: DC | PRN

## 2017-09-04 NOTE — ED Notes (Signed)
Pt resting at this time; appears to be comfortable.

## 2017-09-04 NOTE — ED Provider Notes (Signed)
Patient signed out pending CT scan.  In brief, patient reported mechanical fall at home.  Left-sided rib pain.  CT scan shows 3 left-sided rib fractures.  On reexam, patient reports persistent pain.  C-collar was cleared.  Patient is no longer requiring oxygen.  She had previously received 250 mcg of fentanyl and was drowsy.  She is now continuing to report pain.  Lab work reviewed.  She was given a small dose of Toradol and 50 mcg of fentanyl.  Will consult trauma surgery for admission given persistent pain and number of rib fractures.  She is at risk for pneumonia and complications.  2:00 AM Discussed with Dr. Magnus IvanBlackman, trauma surgery, re: admission.  Will be evaluated.   Shon BatonHorton, Sotero Brinkmeyer F, MD 09/04/17 [phone]

## 2017-09-04 NOTE — ED Notes (Addendum)
Incentive spirometer at bedside per surgical PA; heart pillow given to her for splinting. All Jewelry removed- multiple rings and watch and given to niece at bedside to take home

## 2017-09-04 NOTE — ED Notes (Signed)
Family at bedside again.

## 2017-09-04 NOTE — H&P (Signed)
History   [name] is an 84 y.o. female.   Chief Complaint: No chief complaint on file.   HPI This is an 84-year-old female who presents after a fall.  She reports losing her balance on thick carpet this past evening and tripping and falling on her left side.  She denies hitting her head or loss of consciousness.  She reports only left chest wall pain.  After a chest x-ray, CAT scan of the chest, abdomen, pelvis, cervical spine, and head, she was found to have only multiple left-sided rib fractures with no other injuries.  She denies shortness of breath.  She did have some decreased and her oxygen saturation in the emergency department after receiving pain medication. She denies neck pain, headache, or abdominal pain. Past Medical History:  Diagnosis Date  . Allergy   . Asthma   . CAD (coronary artery disease)    s/p CABG x 5 in 1997 & CABG x 3 in 2010; Last cath in April of 2010 showed grafts to be patent. EF is 40 to 45%  . CHF (congestive heart failure) (HCC)    EF is 40 to 45%  . Depression    following bypass surgery  . Diverticulosis   . Endometriosis   . GERD (gastroesophageal reflux disease)   . Hepatitis A   . Hiatal hernia   . History of cerebrovascular disease   . History of vertebral fracture    at T8  . Hyperlipemia   . Hypertension   . LBP (low back pain)   . Mixed incontinence urge and stress 04/22/2013  . Nocturia 04/22/2013  . Pleural effusion    post surgical  . PVD (peripheral vascular disease) (HCC)    S/P right CEA  . Sleep apnea   . Stroke (HCC) 2010    Past Surgical History:  Procedure Laterality Date  . APPENDECTOMY    . CARDIAC CATHETERIZATION  April 2010   Grafts were patent. EF is 40 to 45%  . CAROTID ENDARTERECTOMY     right  . CORONARY ARTERY BYPASS GRAFT  1997,2010   Original surgery in 1997 x 5; Redo CABG x 3 in 2010 including free RIMA to distal LAD, SVG to OM and SVG to left posterolateral  . KNEE SURGERY     left  . LEFT HEART  CATHETERIZATION WITH CORONARY/GRAFT ANGIOGRAM N/A 03/12/2013   Procedure: LEFT HEART CATHETERIZATION WITH CORONARY/GRAFT ANGIOGRAM;  Surgeon: Peter M Jordan, MD;  Location: [facility]ATH [facility];  Service: Cardiovascular;  Laterality: N/A;  . PARTIAL COLECTOMY     due to colon abcess    Family History  Problem Relation Age of Onset  . Heart attack Mother   . Diabetes Mother   . Heart disease Mother   . Rheum arthritis Mother   . Heart attack Father   . Allergies Father   . Diabetes Sister   . Stroke Sister   . Heart disease Sister   . Dementia Brother   . Healthy Sister   . Other Brother        LUNG ISSUES  . Other Brother        LUNG ISSUES  . Other Brother        LUNG ISSUES  . Diabetes Maternal Grandmother   . Diabetes Paternal Grandmother   . Colon cancer Neg Hx    Social History:  reports that she has never smoked. She has never used smokeless tobacco. She reports that she does not drink alcohol or   use drugs.  Allergies   Allergies  Allergen Reactions  . Metoprolol Other (See Comments)    allopoecia  . Phenergan [Promethazine Hcl] Other (See Comments)    Facial numbness  . Prochlorperazine Edisylate Anaphylaxis  . Zetia [Ezetimibe] Nausea Only and Other (See Comments)    Abdominal pain  . Compazine [Prochlorperazine]     Facial  Paralysis       . Sulfur Other (See Comments)    paralyzes  face   . Niacin Rash  . Sulfonamide Derivatives Nausea And Vomiting    Home Medications   (Not in a hospital admission)  Trauma Course   Results for orders placed or performed during the hospital encounter of 09/03/17 (from the past 48 hour(s))  CBG monitoring, ED     Status: Abnormal   Collection Time: 09/03/17  8:58 PM  Result Value Ref Range   Glucose-Capillary 147 (H) 65 - 99 mg/dL  Comprehensive metabolic panel     Status: Abnormal   Collection Time: 09/03/17 10:05 PM  Result Value Ref Range   Sodium 137 135 - 145 mmol/L   Potassium 4.2 3.5 - 5.1 mmol/L    Chloride 103 101 - 111 mmol/L   CO2 25 22 - 32 mmol/L   Glucose, Bld 153 (H) 65 - 99 mg/dL   BUN 13 6 - 20 mg/dL   Creatinine, Ser 0.87 0.44 - 1.00 mg/dL   Calcium 9.0 8.9 - 10.3 mg/dL   Total Protein 6.2 (L) 6.5 - 8.1 g/dL   Albumin 3.6 3.5 - 5.0 g/dL   AST 20 15 - 41 U/L   ALT 14 14 - 54 U/L   Alkaline Phosphatase 61 38 - 126 U/L   Total Bilirubin 0.5 0.3 - 1.2 mg/dL   GFR calc non Af Amer 59 (L) >60 mL/min   GFR calc Af Amer >60 >60 mL/min    Comment: (NOTE) The eGFR has been calculated using the CKD EPI equation. This calculation has not been validated in all clinical situations. eGFR's persistently <60 mL/min signify possible Chronic Kidney Disease.    Anion gap 9 5 - 15    Comment: Performed at [facility] [address] [place], [place] [address]  CBC     Status: Abnormal   Collection Time: 09/03/17 10:05 PM  Result Value Ref Range   WBC 14.4 (H) 4.0 - 10.5 K/uL   RBC 3.84 (L) 3.87 - 5.11 MIL/uL   Hemoglobin 11.8 (L) 12.0 - 15.0 g/dL   HCT 35.9 (L) 36.0 - 46.0 %   MCV 93.5 78.0 - 100.0 fL   MCH 30.7 26.0 - 34.0 pg   MCHC 32.9 30.0 - 36.0 g/dL   RDW 12.8 11.5 - 15.5 %   Platelets 189 150 - 400 K/uL    Comment: Performed at [facility], [place] [address], [place], [place] [address]  I-stat Chem 8, ED     Status: Abnormal   Collection Time: 09/03/17 10:14 PM  Result Value Ref Range   Sodium 138 135 - 145 mmol/L   Potassium 4.2 3.5 - 5.1 mmol/L   Chloride 102 101 - 111 mmol/L   BUN 13 6 - 20 mg/dL   Creatinine, Ser 0.80 0.44 - 1.00 mg/dL   Glucose, Bld 152 (H) 65 - 99 mg/dL   Calcium, Ion 1.15 1.15 - 1.40 mmol/L   TCO2 25 22 - 32 mmol/L   Hemoglobin 11.9 (L) 12.0 - 15.0 g/dL   HCT 35.0 (L) 36.0 -  46.0 %   Ct Head Wo Contrast  Result Date: 09/03/2017 CLINICAL DATA:  Unwitnessed fall EXAM: CT HEAD WITHOUT CONTRAST CT CERVICAL SPINE WITHOUT CONTRAST TECHNIQUE: Multidetector CT imaging of the head and cervical spine was performed following the standard  protocol without intravenous contrast. Multiplanar CT image reconstructions of the cervical spine were also generated. COMPARISON:  CT brain 07/08/2008, CT a neck 12/22/2010 FINDINGS: CT HEAD FINDINGS Brain: No acute territorial infarction, hemorrhage, or intracranial mass is visualized. Mild atrophy. Mild small vessel ischemic changes of the white matter. Stable ventricle size. Vascular: No hyperdense vessels. Carotid vascular calcification. Vertebral artery calcification Skull: No fracture Sinuses/Orbits: No acute finding. Other: None CT CERVICAL SPINE FINDINGS Alignment: No subluxation.  Facet alignment is within normal limits. Skull base and vertebrae: No acute fracture. No primary bone lesion or focal pathologic process. Soft tissues and spinal canal: No prevertebral fluid or swelling. No visible canal hematoma. Disc levels: Moderate degenerative changes at C5-C6 and C6-C7 with mild degenerative changes at C4-C5. Upper chest: Apical scarring. 17 mm hypodense nodule left lobe of thyroid. Other: None IMPRESSION: 1. No CT evidence for acute intracranial abnormality. Atrophy and small vessel ischemic changes of the white matter 2. Degenerative changes of the cervical spine. No acute osseous abnormality 3. 17 mm left lobe of thyroid nodule. Nonemergent thyroid ultrasound follow-up as indicated Electronically Signed   By: Kim  Fujinaga M.D.   On: 09/03/2017 23:58   Ct Chest W Contrast  Result Date: 09/04/2017 CLINICAL DATA:  Unwitnessed fall. Left rib cage tenderness. History of pleural effusion, thoracic vertebral fracture, hiatal hernia, diverticulosis, partial colectomy and appendectomy. EXAM: CT CHEST, ABDOMEN, AND PELVIS WITH CONTRAST TECHNIQUE: Multidetector CT imaging of the chest, abdomen and pelvis was performed following the standard protocol during bolus administration of intravenous contrast. CONTRAST:  100mL ISOVUE-300 IOPAMIDOL (ISOVUE-300) INJECTION 61% COMPARISON:  CT abdomen and pelvis  10/20/2013, CXR 09/03/2017 FINDINGS: CT CHEST FINDINGS Cardiovascular: Status post CABG. Normal branch pattern of the great vessels with atherosclerotic calcifications at the origin of the left subclavian artery. No significant stenosis. Moderate aortic atherosclerosis without aneurysmal dilatation. No large central pulmonary embolus. Left main and three-vessel native coronary arteriosclerosis with mild cardiomegaly. No pericardial effusion. Mediastinum/Nodes: No adenopathy. Patent trachea and mainstem bronchi. Mild circumferential thickening of the upper third of the esophagus suspicious for changes of esophagitis. Lungs/Pleura: Apical pleuroparenchymal scarring. Bibasilar atelectasis left greater than right with lower lobe bronchiectasis. Trace left effusion. No pneumothorax. Musculoskeletal: Acute left sixth, seventh, eighth and tenth rib fractures, most prominent posteriorly involving the seventh rib with 1 shaft with displacement. Cement noted within the T8 vertebral body from prior kyphoplasty. CT ABDOMEN PELVIS FINDINGS Hepatobiliary: Homogeneous appearance of the esophagus with fatty infiltration along the falciform ligament. Unremarkable gallbladder. No biliary dilatation. Pancreas: Atrophic pancreas without ductal dilatation, mass or inflammation. Spleen: Normal without laceration. Adrenals/Urinary Tract: No adrenal hemorrhage. Symmetric enhancement of the kidneys with exophytic simple cyst off lateral interpolar left kidney measuring 2.5 cm. Additional tiny too small to characterize hypodensities are seen within the left kidney compatible more likely to represent tiny cysts. No nephrolithiasis nor obstructive uropathy. The bladder is unremarkable for degree of distention. Stomach/Bowel: Physiologic distention of the stomach. Normal small bowel rotation. Extensive diffuse colonic diverticulosis without acute diverticulitis. Status post appendectomy. Vascular/Lymphatic: Moderate aortoiliac and branch vessel  atherosclerosis. No adenopathy. Reproductive: Unremarkable uterus and adnexa. Other: No free air nor free fluid. Musculoskeletal: Thoracolumbar spondylosis. Multilevel degenerative disc disease T12 through L3. Multilevel degenerative facet arthropathy from   L2 through S1. IMPRESSION: Chest CT: 1. Acute left 6 seventh, eighth and tenth rib fractures with adjacent atelectasis and trace effusion. No pneumothorax. 2. Status post CABG. 3. Aortic atherosclerosis without mediastinal hematoma. 4. T8 kyphoplasty. Abdomen and pelvic CT: 1. No acute solid nor hollow visceral organ injury. 2. Colonic diverticulosis without acute diverticulitis. 3. Left-sided renal cysts the largest approximately 2.5 cm. 4. Lumbar spondylosis. Electronically Signed   By: Ashley Royalty M.D.   On: 09/04/2017 00:27   Ct Cervical Spine Wo Contrast  Result Date: 09/03/2017 CLINICAL DATA:  Unwitnessed fall EXAM: CT HEAD WITHOUT CONTRAST CT CERVICAL SPINE WITHOUT CONTRAST TECHNIQUE: Multidetector CT imaging of the head and cervical spine was performed following the standard protocol without intravenous contrast. Multiplanar CT image reconstructions of the cervical spine were also generated. COMPARISON:  CT brain 07/08/2008, CT a neck 12/22/2010 FINDINGS: CT HEAD FINDINGS Brain: No acute territorial infarction, hemorrhage, or intracranial mass is visualized. Mild atrophy. Mild small vessel ischemic changes of the white matter. Stable ventricle size. Vascular: No hyperdense vessels. Carotid vascular calcification. Vertebral artery calcification Skull: No fracture Sinuses/Orbits: No acute finding. Other: None CT CERVICAL SPINE FINDINGS Alignment: No subluxation.  Facet alignment is within normal limits. Skull base and vertebrae: No acute fracture. No primary bone lesion or focal pathologic process. Soft tissues and spinal canal: No prevertebral fluid or swelling. No visible canal hematoma. Disc levels: Moderate degenerative changes at C5-C6 and C6-C7 with  mild degenerative changes at C4-C5. Upper chest: Apical scarring. 17 mm hypodense nodule left lobe of thyroid. Other: None IMPRESSION: 1. No CT evidence for acute intracranial abnormality. Atrophy and small vessel ischemic changes of the white matter 2. Degenerative changes of the cervical spine. No acute osseous abnormality 3. 17 mm left lobe of thyroid nodule. Nonemergent thyroid ultrasound follow-up as indicated Electronically Signed   By: Donavan Foil M.D.   On: 09/03/2017 23:58   Ct Abdomen Pelvis W Contrast  Result Date: 09/04/2017 CLINICAL DATA:  Unwitnessed fall. Left rib cage tenderness. History of pleural effusion, thoracic vertebral fracture, hiatal hernia, diverticulosis, partial colectomy and appendectomy. EXAM: CT CHEST, ABDOMEN, AND PELVIS WITH CONTRAST TECHNIQUE: Multidetector CT imaging of the chest, abdomen and pelvis was performed following the standard protocol during bolus administration of intravenous contrast. CONTRAST:  1100m ISOVUE-300 IOPAMIDOL (ISOVUE-300) INJECTION 61% COMPARISON:  CT abdomen and pelvis 10/20/2013, CXR 09/03/2017 FINDINGS: CT CHEST FINDINGS Cardiovascular: Status post CABG. Normal branch pattern of the great vessels with atherosclerotic calcifications at the origin of the left subclavian artery. No significant stenosis. Moderate aortic atherosclerosis without aneurysmal dilatation. No large central pulmonary embolus. Left main and three-vessel native coronary arteriosclerosis with mild cardiomegaly. No pericardial effusion. Mediastinum/Nodes: No adenopathy. Patent trachea and mainstem bronchi. Mild circumferential thickening of the upper third of the esophagus suspicious for changes of esophagitis. Lungs/Pleura: Apical pleuroparenchymal scarring. Bibasilar atelectasis left greater than right with lower lobe bronchiectasis. Trace left effusion. No pneumothorax. Musculoskeletal: Acute left sixth, seventh, eighth and tenth rib fractures, most prominent posteriorly  involving the seventh rib with 1 shaft with displacement. Cement noted within the T8 vertebral body from prior kyphoplasty. CT ABDOMEN PELVIS FINDINGS Hepatobiliary: Homogeneous appearance of the esophagus with fatty infiltration along the falciform ligament. Unremarkable gallbladder. No biliary dilatation. Pancreas: Atrophic pancreas without ductal dilatation, mass or inflammation. Spleen: Normal without laceration. Adrenals/Urinary Tract: No adrenal hemorrhage. Symmetric enhancement of the kidneys with exophytic simple cyst off lateral interpolar left kidney measuring 2.5 cm. Additional tiny too small to characterize hypodensities are  seen within the left kidney compatible more likely to represent tiny cysts. No nephrolithiasis nor obstructive uropathy. The bladder is unremarkable for degree of distention. Stomach/Bowel: Physiologic distention of the stomach. Normal small bowel rotation. Extensive diffuse colonic diverticulosis without acute diverticulitis. Status post appendectomy. Vascular/Lymphatic: Moderate aortoiliac and branch vessel atherosclerosis. No adenopathy. Reproductive: Unremarkable uterus and adnexa. Other: No free air nor free fluid. Musculoskeletal: Thoracolumbar spondylosis. Multilevel degenerative disc disease T12 through L3. Multilevel degenerative facet arthropathy from L2 through S1. IMPRESSION: Chest CT: 1. Acute left 6 seventh, eighth and tenth rib fractures with adjacent atelectasis and trace effusion. No pneumothorax. 2. Status post CABG. 3. Aortic atherosclerosis without mediastinal hematoma. 4. T8 kyphoplasty. Abdomen and pelvic CT: 1. No acute solid nor hollow visceral organ injury. 2. Colonic diverticulosis without acute diverticulitis. 3. Left-sided renal cysts the largest approximately 2.5 cm. 4. Lumbar spondylosis. Electronically Signed   By: Ashley Royalty M.D.   On: 09/04/2017 00:27   Dg Chest Port 1 View  Result Date: 09/03/2017 CLINICAL DATA:  Fall today.  LEFT chest wall  pain.  History of CHF. EXAM: PORTABLE CHEST 1 VIEW COMPARISON:  Chest radiograph April 26, 2017. FINDINGS: Cardiac silhouette is mildly enlarged and unchanged. Status post median sternotomy for CABG. Heavily calcified aortic knob. Mild chronic interstitial changes. No pneumothorax. Osteopenia. Single level in the thoracic vertebral body cement augmentation. Slight cortical irregularity LEFT lateral seventh rib. IMPRESSION: 1. Potential nondisplaced LEFT seventh rib fracture. 2. Stable cardiomegaly and chronic interstitial changes. 3.  Aortic Atherosclerosis (ICD10-I70.0). Electronically Signed   By: Elon Alas M.D.   On: 09/03/2017 22:07    Review of Systems  Eyes: Negative for blurred vision.  Respiratory: Negative for cough and shortness of breath.   Cardiovascular: Positive for chest pain.  Gastrointestinal: Negative for abdominal pain.  Musculoskeletal: Negative for back pain and neck pain.  Neurological: Negative for dizziness and headaches.    Blood pressure (!) 118/51, pulse 65, resp. rate 15, SpO2 94 %. Physical Exam  Constitutional: She is oriented to person, place, and time. She appears well-developed and well-nourished. No distress.  HENT:  Head: Normocephalic and atraumatic.  Right Ear: External ear normal.  Left Ear: External ear normal.  Nose: Nose normal.  Eyes: Pupils are equal, round, and reactive to light.  Neck: Normal range of motion. Neck supple. No tracheal deviation present.  Nontender  Cardiovascular: Normal rate, regular rhythm, normal heart sounds and intact distal pulses.  Respiratory: Effort normal. No respiratory distress. She has no wheezes. She exhibits tenderness.  There is tenderness along the left chest.  She has difficulty moving secondary to the left chest pain  GI: Soft. There is no tenderness. There is no rebound.  Musculoskeletal: Normal range of motion. She exhibits no edema, tenderness or deformity.  Neurological: She is alert and  oriented to person, place, and time.  Skin: Skin is warm and dry.  Psychiatric: Her behavior is normal. Judgment normal.     Assessment/Plan Patient status post fall with multiple left-sided rib fractures  Given her age and multiple comorbidities, the plan will be to admit her to the stepdown unit for pain management and pulmonary toilet with close monitoring.  Repeat chest x-ray has been ordered for later this morning.  We discussed risk of pneumonia without adequate pain control and pulmonary toilet given her age.  She understands and agrees with the admission.  Elmer Boutelle A 09/04/2017, 5:50 AM   Procedures

## 2017-09-04 NOTE — ED Notes (Signed)
Meal Tray ordered at 17:35 

## 2017-09-04 NOTE — ED Notes (Signed)
Food Tray delivered and at bedside.  

## 2017-09-04 NOTE — ED Notes (Signed)
Pt reminded to due spirometry- very complaint

## 2017-09-05 ENCOUNTER — Other Ambulatory Visit: Payer: Self-pay

## 2017-09-05 LAB — MRSA PCR SCREENING: MRSA by PCR: NEGATIVE

## 2017-09-05 MED ORDER — FENTANYL CITRATE (PF) 100 MCG/2ML IJ SOLN: 12.5000 ug | INTRAMUSCULAR | Status: DC | PRN

## 2017-09-05 MED ORDER — TRAMADOL HCL 50 MG PO TABS
50.0000 mg | ORAL_TABLET | Freq: Four times a day (QID) | ORAL | Status: DC
  Administered 2017-09-05 – 2017-09-06 (×5): 50 mg via ORAL
  Filled 2017-09-05 (×5): qty 1

## 2017-09-05 MED ORDER — ENSURE ENLIVE PO LIQD
237.0000 mL | Freq: Two times a day (BID) | ORAL | Status: DC
  Administered 2017-09-05 – 2017-09-06 (×3): 237 mL via ORAL

## 2017-09-05 NOTE — NC FL2 (Signed)
[place] MEDICAID FL2 LEVEL OF CARE SCREENING TOOL     IDENTIFICATION  Patient Name: [name] Birthdate: 26-Jan-1929 Sex: female Admission Date (Current Location): 09/03/2017  [facility] and [place] Number:  Producer, television/film/video and Address:  [facility], [address], [place], [place] [address]      Provider Number: [id number]  Attending Physician Name and Address:  Md, Trauma, MD  Relative Name and Phone Number:       Current Level of Care: Hospital Recommended Level of Care: Skilled Nursing Facility Prior Approval Number:    Date Approved/Denied:   PASRR Number: [id number]  Discharge Plan: SNF    Current Diagnoses: Patient Active Problem List   Diagnosis Date Noted  . Multiple rib fractures 09/04/2017  . Dyspnea 01/21/2014  . Chronic diastolic CHF (congestive heart failure) (HCC) 12/15/2013  . Cerebral vascular disease 10/13/2013  . Mixed incontinence urge and stress 04/22/2013  . Nocturia 04/22/2013  . Angina decubitus (HCC) 03/12/2013  . GERD (gastroesophageal reflux disease) 08/16/2011  . Other general symptoms  08/16/2011  . Fatigue 09/04/2010  . HEPATITIS, ACUTE 07/12/2009  . AMI 07/12/2009  . ESOPHAGEAL REFLUX 07/12/2009  . DIVERTICULOSIS, COLON 07/12/2009  . CHEST PAIN 07/12/2009  . SLEEP APNEA 10/23/2007  . Essential hypertension 09/18/2007  . CAD (coronary artery disease) 09/18/2007  . CHEST PAIN, ATYPICAL 09/18/2007    Orientation RESPIRATION BLADDER Height & Weight     Self, Time, Situation, Place  Normal Continent Weight: 119 lb (54 kg) Height:  5\' 5"  (165.1 cm)  BEHAVIORAL SYMPTOMS/MOOD NEUROLOGICAL BOWEL NUTRITION STATUS      Continent Diet(Heart Healthy with Thin Liquids)  AMBULATORY STATUS COMMUNICATION OF NEEDS Skin   Extensive Assist Verbally Normal                       Personal Care Assistance Level of Assistance  Bathing, Feeding, Dressing Bathing Assistance: Limited assistance Feeding  assistance: Independent Dressing Assistance: Limited assistance     Functional Limitations Info  Sight, Hearing, Speech Sight Info: Adequate Hearing Info: Adequate Speech Info: Adequate    SPECIAL CARE FACTORS FREQUENCY  PT (By licensed PT), OT (By licensed OT)     PT Frequency: 3x week OT Frequency: 3x week            Contractures Contractures Info: Not present    Additional Factors Info  Code Status, Allergies Code Status Info: Full Code Allergies Info: Metoprolol, Phenergan Promethazine Hcl, Prochlorperazine Edisylate, Zetia Ezetimibe, Compazine Prochlorperazine, Sulfur, Niacin, Sulfonamide Derivatives           Current Medications (09/05/2017):  This is the current hospital active medication list Current Facility-Administered Medications  Medication Dose Route Frequency Provider Last Rate Last Dose  . 0.9 % NaCl with KCl 20 mEq/ L  infusion   Intravenous Continuous Violeta Gelinas, MD 10 mL/hr at 09/05/17 1000    . acetaminophen (TYLENOL) tablet 1,000 mg  1,000 mg Oral TID Meuth, Brooke A, PA-C   1,000 mg at 09/05/17 1000  . enoxaparin (LOVENOX) injection 30 mg  30 mg Subcutaneous Q24H Abigail Miyamoto, MD   30 mg at 09/05/17 1345  . feeding supplement (ENSURE ENLIVE) (ENSURE ENLIVE) liquid 237 mL  237 mL Oral BID BM Violeta Gelinas, MD   237 mL at 09/05/17 1415  . fentaNYL (SUBLIMAZE) injection 12.5 mcg  12.5 mcg Intravenous Q2H PRN Violeta Gelinas, MD      . isosorbide mononitrate (IMDUR) 24 hr tablet 60  mg  60 mg Oral Daily Abigail MiyamotoBlackman, Douglas, MD   60 mg at 09/05/17 1000  . methocarbamol (ROBAXIN) tablet 500 mg  500 mg Oral TID Meuth, Brooke A, PA-C   500 mg at 09/05/17 1000  . ondansetron (ZOFRAN-ODT) disintegrating tablet 4 mg  4 mg Oral Q6H PRN Abigail MiyamotoBlackman, Douglas, MD       Or  . ondansetron [facility](ZOFRAN) injection 4 mg  4 mg Intravenous Q6H PRN Abigail MiyamotoBlackman, Douglas, MD   4 mg at 09/05/17 0849  . oxyCODONE (Oxy IR/ROXICODONE) immediate release tablet 5-10 mg  5-10 mg Oral  Q4H PRN Meuth, Brooke A, PA-C   10 mg at 09/05/17 0850  . traMADol (ULTRAM) tablet 50 mg  50 mg Oral Q6H Violeta Gelinashompson, Burke, MD   50 mg at 09/05/17 1200     Discharge Medications: Please see discharge summary for a list of discharge medications.  Relevant Imaging Results:  Relevant Lab Results:   Additional Information SSN [id number]   Macario GoldsJesse Shariya Gaster, [place] [phone]

## 2017-09-05 NOTE — Evaluation (Signed)
Physical Therapy Evaluation Patient Details Name: [name] MRN: [id number] DOB: Sep 02, 1928 Today's Date: 09/05/2017   History of Present Illness  84 yo admitted after fall at home with left rib fx. PMHx: CAD, asthma, CHF, hep A, HTN, HLD, PVD, cVA  Clinical Impression  Pt with limited functional mobility requiring assist for all transfers at this time secondary to pain, weakness and impaired balance. Pt will benefit from acute therapy to maximize mobility, function, gait and safety to decrease burden of care. Pt incontinent throughout session with assist for all pericare and linen change and reports she wears briefs at home.      Follow Up Recommendations SNF;Supervision/Assistance - 24 hour    Equipment Recommendations  Rolling walker with 5" wheels;3in1 (PT)    Recommendations for Other Services       Precautions / Restrictions Precautions Precautions: Fall Precaution Comments: incontinent      Mobility  Bed Mobility Overal bed mobility: Needs Assistance Bed Mobility: Rolling;Sidelying to Sit Rolling: Max assist Sidelying to sit: Mod assist       General bed mobility comments: cues for sequence with pt guarding and very hesistant to move due to fear of pain. Physical assist to roll and elevate trunk from surface. Pt resistant even to [facility] movement with no pt movement  Transfers Overall transfer level: Needs assistance   Transfers: Sit to/from Stand;Stand Pivot Transfers Sit to Stand: Min assist Stand pivot transfers: Min assist       General transfer comment: min assist to stand with cues for hand placement, pt fearful and initial 2 trials pt taking hands off surface or walker and grasping for staff despite cues for safety and sequence. Pt stood x 5 for pericare due to incontinence with each trial. Min assist with RW to direct and manipulate RW for moving to chair  Ambulation/Gait                Stairs            Wheelchair Mobility    Modified  Rankin (Stroke Patients Only)       Balance Overall balance assessment: Needs assistance   Sitting balance-Leahy Scale: Fair       Standing balance-Leahy Scale: Poor                               Pertinent Vitals/Pain Pain Assessment: 0-10 Pain Score: 6  Pain Location: left chest Pain Descriptors / Indicators: Sore;Guarding;Grimacing Pain Intervention(s): Limited activity within patient's tolerance;Repositioned;Monitored during session;Premedicated before session    Home Living Family/patient expects to be discharged to:: Skilled nursing facility Living Arrangements: Alone   Type of Home: House Home Access: Ramped entrance     Home Layout: Two level;Able to live on main level with bedroom/bathroom Home Equipment: Grab bars - tub/shower;Cane - single point;Shower seat      Prior Function Level of Independence: Independent with assistive device(s)         Comments: pt uses cane for gait, has assist for cleaning     Hand Dominance        Extremity/Trunk Assessment   Upper Extremity Assessment Upper Extremity Assessment: Generalized weakness    Lower Extremity Assessment Lower Extremity Assessment: Generalized weakness    Cervical / Trunk Assessment Cervical / Trunk Assessment: Kyphotic  Communication   Communication: No difficulties  Cognition Arousal/Alertness: Awake/alert Behavior During Therapy: WFL for tasks assessed/performed Overall Cognitive Status: Impaired/Different from baseline Area of Impairment:  Following commands;Safety/judgement;Awareness                       Following Commands: Follows one step commands inconsistently;Follows one step commands with increased time Safety/Judgement: Decreased awareness of safety;Decreased awareness of deficits Awareness: Emergent          General Comments      Exercises     Assessment/Plan    PT Assessment Patient needs continued PT services  PT Problem List Decreased  strength;Decreased mobility;Decreased safety awareness;Decreased activity tolerance;Decreased balance;Decreased cognition;Decreased knowledge of use of DME;Pain;Decreased coordination       PT Treatment Interventions Gait training;Therapeutic exercise;Patient/family education;Functional mobility training;Balance training;DME instruction;Therapeutic activities    PT Goals (Current goals can be found in the Care Plan section)  Acute Rehab PT Goals Patient Stated Goal: return home or potentially ALF PT Goal Formulation: With patient Time For Goal Achievement: 09/19/17 Potential to Achieve Goals: Good    Frequency Min 3X/week   Barriers to discharge Decreased caregiver support      Co-evaluation               AM-PAC PT "6 Clicks" Daily Activity  Outcome Measure Difficulty turning over in bed (including adjusting bedclothes, sheets and blankets)?: Unable Difficulty moving from lying on back to sitting on the side of the bed? : Unable Difficulty sitting down on and standing up from a chair with arms (e.g., wheelchair, bedside commode, etc,.)?: Unable Help needed moving to and from a bed to chair (including a wheelchair)?: A Lot Help needed walking in hospital room?: A Lot Help needed climbing 3-5 steps with a railing? : A Lot 6 Click Score: 9    End of Session Equipment Utilized During Treatment: Gait belt Activity Tolerance: Patient tolerated treatment well Patient left: in chair;with call bell/phone within reach;with chair alarm set Nurse Communication: Mobility status PT Visit Diagnosis: Other abnormalities of gait and mobility (R26.89);Muscle weakness (generalized) (M62.81)    Time: [id number] PT Time Calculation (min) (ACUTE ONLY): 26 min   Charges:   PT Evaluation $PT Eval Moderate Complexity: 1 Mod PT Treatments $Therapeutic Activity: 8-22 mins   PT G Codes:        Delaney MeigsMaija Tabor Dahlton Hinde, PT [phone]   Beola Vasallo B Mellony Danziger 09/05/2017, 10:09 AM

## 2017-09-05 NOTE — Progress Notes (Signed)
Initial Nutrition Assessment  DOCUMENTATION CODES:   Non-severe (moderate) malnutrition in context of chronic illness  INTERVENTION:   - Ensure Enlive po BID, each supplement provides 350 kcal and 20 grams of protein (chocolate)  - Encourage PO intake  NUTRITION DIAGNOSIS:   Moderate Malnutrition related to chronic illness(CHF) as evidenced by severe muscle depletion, moderate muscle depletion, moderate fat depletion, severe fat depletion.  GOAL:   Patient will meet greater than or equal to 90% of their needs  MONITOR:   PO intake, Supplement acceptance, Weight trends  REASON FOR ASSESSMENT:   Malnutrition Screening Tool    ASSESSMENT:   84 year old female who presented to ED from home with reports of unwitnessed fall. PMH significant for hypertension, hyperlipidemia, CHF, CAD s/p CABG x 5 in 1997 and CABG x 3 in 2010, CVA, PVD, and GERD. CT showed 3 left-sided rib fractures.  Discussed pt during interdisciplinary ICU rounds and with RN.  Spoke with pt at bedside who reports having a poor appetite for 1-2 months. Pt is unsure why this happened. Pt states that "things don't taste good like they used to." Pt reports eating 3 meals daily but says the meals are "light." A breakfast might include a blueberry muffin, a biscuit with meat, or mini blueberry donuts. Pt often drinks a Boost for lunch. Pt agreeable to receiving Ensure Enlive during current admission.  Pt reported eating a bacon, egg, and cheese biscuit with juice this morning for breakfast. The biscuit was brought by a family member. Meal completion listed as 25%. Pt drinking cranberry juice at time of visit.  Pt provided RD with a long weight history dating back to teenage years. 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 Pt reports that 135 lbs has been her UBW since that time but that she started losing weight 1 year ago when her  husband died. Per weight history in chart, pt has lost 19 lbs in 11 months. This is a 13.6% weight loss which is not significant for timeframe.  Pt states she does not cook much but eats what her niece Claris Che brings her. Pt denies chewing or swallowing difficulties.  Medications reviewed and include: IV 20 mEq KCl in 0.9% NaCl @ 10 ml/hr, Zofran  Labs reviewed: hemoglobin 11, HCT 34.5 CBG: 147  NUTRITION - FOCUSED PHYSICAL EXAM:    Most Recent Value  Orbital Region  Moderate depletion  Upper Arm Region  Moderate depletion  Thoracic and Lumbar Region  Severe depletion  Buccal Region  Moderate depletion  Temple Region  Moderate depletion  Clavicle Bone Region  Severe depletion  Clavicle and Acromion Bone Region  Severe depletion  Scapular Bone Region  Unable to assess  Dorsal Hand  Moderate depletion  Patellar Region  Severe depletion  Anterior Thigh Region  Severe depletion  Posterior Calf Region  Severe depletion  Edema (RD Assessment)  None  Hair  Reviewed  Eyes  Reviewed  Mouth  Reviewed  Skin  Reviewed  Nails  Reviewed       Diet Order:  Diet Heart Room service appropriate? Yes; Fluid consistency: Thin  EDUCATION NEEDS:   No education needs have been identified at this time  Skin:  Skin Assessment: Reviewed RN Assessment  Last BM:  unknown/PTA  Height:   Ht Readings from Last 1 Encounters:  09/05/17 5\' 5"  (1.651 m)    Weight:  Wt Readings from Last 1 Encounters:  09/05/17 119 lb (54 kg)    Ideal Body Weight:  56.8 kg  BMI:  Body mass index is 19.8 kg/m.  Estimated Nutritional Needs:   Kcal:  1500-1700 kcal/day (28-31 kcal/kg)  Protein:  65-80 grams/day  Fluid:  1.5-1.7 L/day    Earma ReadingKate Jablonski Indigo Chaddock, MS, RD, LDN Pager: [phone] Weekend/After Hours: [phone]

## 2017-09-05 NOTE — Clinical Social Work Note (Signed)
Clinical Social Work Assessment  Patient Details  Name: [name] MRN: [id number] Date of Birth: 07/05/1928  Date of referral:  09/05/17               Reason for consult:  Trauma, Facility Placement                Permission sought to share information with:  Family Supports Permission granted to share information::  Yes, Verbal Permission Granted  Name::     [name] Raines  Relationship::  Niece  Contact Information:  [phone]  Housing/Transportation Living arrangements for the past 2 months:  Single Family Home Source of Information:  Patient, Other (Comment Required)(Niece) Patient Interpreter Needed:  None Criminal Activity/Legal Involvement Pertinent to Current Situation/Hospitalization:  No - Comment as needed Significant Relationships:  Other Family Members, Pets(Short sugar - poodle) Lives with:  Self, Pets Do you feel safe going back to the place where you live?  Yes Need for family participation in patient care:  Yes (Comment)  Care giving concerns:  Patient niece, [name], at bedside who states that patient has received home health but has never been to a facility.  Patient niece has reassured patient that she will look after her dog of 16 years so she can focus on rehab.  Patient is hopeful to be placed in a facility in [place] where dog can visit with family.   Social Worker assessment / plan:  Clinical Social Worker met with patient at bedside to offer support and discuss patient needs at discharge.  Patient states that she lives at home alone and was going to answer the phone when she tripped on the carpet.  Patient has a niece who is available to provide support, however not able to provide 24/7 care.  Patient is agreeable to short term SNF placement only, preferably in [place].  CSW to complete FL2 and initiate referral to facilities.  CSW to follow up with patient and patient niece regarding available bed offers and will facilitate patient discharge once  medically stable.  Clinical Social Worker inquired about current substance use.  Patient with no current use or concerns regarding history of use.  SBIRT complete and no resources needed.  CSW available as needed for support.  Employment status:  Retired Insurance information:  Managed Medicare PT Recommendations:  Skilled Nursing Facility Information / Referral to community resources:  Skilled Nursing Facility, SBIRT  Patient/Family's Response to care:  Patient and family verbalized appreciation for support and understanding of CSW role and involvement.  Patient and family agreeable with short term SNF placement prior to patient return home.  Patient/Family's Understanding of and Emotional Response to Diagnosis, Current Treatment, and Prognosis:  Patient and family understanding of patient limitations and need for continued rehab.  Patient with good family support emotionally and will thrive well knowing they are caring for her pets.  Emotional Assessment Appearance:  Appears stated age Attitude/Demeanor/Rapport:  Engaged, Charismatic, Gracious Affect (typically observed):  Appropriate, Accepting, Pleasant Orientation:  Oriented to Self, Oriented to Situation, Oriented to Place, Oriented to  Time Alcohol / Substance use:  Never Used Psych involvement (Current and /or in the community):  No (Comment)  Discharge Needs  Concerns to be addressed:  No discharge needs identified Readmission within the last 30 days:  No Current discharge risk:  Lives alone, Lack of support system Barriers to Discharge:  Continued Medical Work up  Jesse , LCSW [phone]  

## 2017-09-05 NOTE — Progress Notes (Signed)
Responded to scc to assist patient with AD.  Patient being bathed and nurse took AD for patient to complete later.  Nurse will page when ready. [name], Allie Ousley, [place], [facility], Pager [phone]

## 2017-09-05 NOTE — Plan of Care (Signed)
Pt asking about discharge plans already with plans for a specific place for rehab. Pt using IS well, some reminding needed, but meets incentive goals. Scheduled medications managing pt pain well, denies pain at this time.

## 2017-09-05 NOTE — Care Management Note (Signed)
Case Management Note  Patient Details  Name: [name] B Santibanez MRN: [id number] Date of Birth: 12/01/1928  Subjective/Objective:  84 yo admitted after fall at home with left rib fx.  PTA, pt independent with assistive device; lives alone.                   Action/Plan: PT recommending SNF for rehab; CSW consulted to facilitate dc to SNF upon medical stability.    Expected Discharge Date:                  Expected Discharge Plan:  Skilled Nursing Facility  In-House Referral:  Clinical Social Work  Discharge planning Services  CM Consult  Post Acute Care Choice:    Choice offered to:     DME Arranged:    DME Agency:     HH Arranged:    HH Agency:     Status of Service:  In process, will continue to follow  If discussed at Long Length of Stay Meetings, dates discussed:    Additional Comments:  Quintella BatonJulie W. Paislee Szatkowski, RN, BSN  Trauma/Neuro ICU Case Manager [phone]

## 2017-09-05 NOTE — Progress Notes (Signed)
  Subjective: Reports morphine makes her confused, L rib pain  Objective: Vital signs in last 24 hours: Temp:  [97.6 F (36.4 C)-97.9 F (36.6 C)] 97.6 F (36.4 C) (04/11 0811) Pulse Rate:  [62-75] 66 (04/11 0700) Resp:  [9-20] 17 (04/11 0700) BP: (93-156)/(37-86) 137/57 (04/11 0700) SpO2:  [89 %-100 %] 98 % (04/11 0700) Weight:  [54 kg (119 lb)] 54 kg (119 lb) (04/11 0600) Last BM Date: (PTA)  Intake/Output from previous day: 04/10 0701 - 04/11 0700 In: 160 [P.O.:60; I.V.:100] Out: -  Intake/Output this shift: No intake/output data recorded.  General appearance: alert and cooperative Resp: clear to auscultation bilaterally Chest wall: left sided chest wall tenderness Cardio: regular rate and rhythm GI: soft, NT Extremities: no edema  Lab Results: CBC  Recent Labs    09/03/17 2205 09/03/17 2214 09/04/17 0530  WBC 14.4*  --  10.9*  HGB 11.8* 11.9* 11.0*  HCT 35.9* 35.0* 34.5*  PLT 189  --  173   BMET Recent Labs    09/03/17 2205 09/03/17 2214 09/04/17 0530  NA 137 138 137  K 4.2 4.2 4.7  CL 103 102 104  CO2 25  --  22  GLUCOSE 153* 152* 124*  BUN 13 13 11   CREATININE 0.87 0.80 0.87  CALCIUM 9.0  --  9.0    Assessment/Plan: Fall L rib FX 6-8, 10 - pulm toilet, CXR in AM, does better with IS after tylenol/robaxin FEN - schedule Ultram, D/C morphine, Fentanyl PRN VTE - Lovenox Dispo - SDU level, she lives alone, PT/OT I spoke with her niece and she reports there is no family available to stay with her at D/C.   LOS: 1 day    Violeta GelinasBurke Jeanny Rymer, MD, MPH, FACS Trauma: [phone] General Surgery: [phone]  4/11/2019Patient ID: [name], female   DOB: [birth year], 84 y.o.   MRN: [id number]

## 2017-09-06 ENCOUNTER — Inpatient Hospital Stay (HOSPITAL_COMMUNITY): Payer: Medicare Other

## 2017-09-06 DIAGNOSIS — E440 Moderate protein-calorie malnutrition: Secondary | ICD-10-CM

## 2017-09-06 LAB — CBC
HCT: 36 % (ref 36.0–46.0)
Hemoglobin: 11.7 g/dL — ABNORMAL LOW (ref 12.0–15.0)
MCH: 30.7 pg (ref 26.0–34.0)
MCHC: 32.5 g/dL (ref 30.0–36.0)
MCV: 94.5 fL (ref 78.0–100.0)
Platelets: 164 10*3/uL (ref 150–400)
RBC: 3.81 MIL/uL — ABNORMAL LOW (ref 3.87–5.11)
RDW: 12.9 % (ref 11.5–15.5)
WBC: 9.2 10*3/uL (ref 4.0–10.5)

## 2017-09-06 MED ORDER — ORAL CARE MOUTH RINSE
15.0000 mL | Freq: Two times a day (BID) | OROMUCOSAL | Status: DC
  Administered 2017-09-06 – 2017-09-10 (×7): 15 mL via OROMUCOSAL

## 2017-09-06 MED ORDER — TRAMADOL HCL 50 MG PO TABS
50.0000 mg | ORAL_TABLET | Freq: Four times a day (QID) | ORAL | Status: DC | PRN
  Administered 2017-09-07 (×2): 50 mg via ORAL
  Filled 2017-09-06 (×2): qty 1

## 2017-09-06 MED ORDER — OXYCODONE HCL 5 MG PO TABS
2.5000 mg | ORAL_TABLET | ORAL | Status: DC | PRN
  Administered 2017-09-07 – 2017-09-08 (×5): 2.5 mg via ORAL
  Filled 2017-09-06 (×5): qty 1

## 2017-09-06 NOTE — Discharge Summary (Signed)
Physician Discharge Summary  Patient ID: [name] MRN: [id number] DOB/AGE: 1928/12/22 84 y.o.  Admit date: 09/03/2017 Discharge date: 09/10/2017  Discharge Diagnoses Fall from ground level Multiple left rib fractures Atrial fibrillation with RVR UTI  Consultants Cardiology   Procedures None  Hospital Course: This is an 84 year old female who presented after a fall. She reported losing her balance on thick carpet this past evening and tripping and falling on her left side.  She denied hitting her head or loss of consciousness.  She reported only left chest wall pain.  After a chest x-ray, CAT scan of the chest, abdomen, pelvis, cervical spine, and head, she was found to have only multiple left-sided rib fractures with no other injuries. She denied shortness of breath. She did have some decreased in her oxygen saturation in the emergency department after receiving pain medication, but remained stable and did not require intubation. She denied neck pain, headache, or abdominal pain. Patient was admitted to the trauma service for observation, pain control and therapies. Follow up CXR 4/12 showed increasing atelectasis as expected but no pneumothorax. Patient's niece reported that there was no family available to be with patient 24/7 at discharge. PT/OT evaluated patient and recommended SNF. Patient noted to have new onset A. Fib with RVR 4/15 and cardiology consulted, this resolved with initiation of a beta blocker. Patient noted to have some delirium 4/14 therefore narcotics were discontinued and UA ordered. UA was suggestive of UTI and patient was started on macrobid for this 4/15 which she will take for 5 days. On 4/16 she was alert and oriented x3.  On 09/10/2017, patient was tolerating a diet, VSS, pain reasonably well controlled, and overall felt stable for discharge. She is discharged to SNF in stable condition. She will follow up as below.   Allergies as of 09/10/2017      Reactions   Metoprolol Other (See Comments)   allopoecia   Phenergan [promethazine Hcl] Other (See Comments)   Facial numbness   Prochlorperazine Edisylate Anaphylaxis   Zetia [ezetimibe] Nausea Only, Other (See Comments)   Abdominal pain   Compazine [prochlorperazine]    Facial  Paralysis    Sulfur Other (See Comments)   paralyzes  face   Niacin Rash   Sulfonamide Derivatives Nausea And Vomiting      Medication List    STOP taking these medications   BYSTOLIC 10 MG tablet Generic drug:  nebivolol   traMADol 50 MG tablet Commonly known as:  ULTRAM     TAKE these medications   acetaminophen 500 MG tablet Commonly known as:  TYLENOL Take 2 tablets (1,000 mg total) by mouth 3 (three) times daily.   aspirin EC 81 MG tablet Take 1 tablet (81 mg total) by mouth daily.   esomeprazole 20 MG capsule Commonly known as:  NEXIUM Take 20 mg by mouth daily at 12 noon.   isosorbide mononitrate 60 MG 24 hr tablet Commonly known as:  IMDUR Take 1 tablet (60 mg total) by mouth daily. Please make yearly appt with Dr. Elease Hashimoto for May before anymore refills. 1st attempt What changed:  Another medication with the same name was added. Make sure you understand how and when to take each.   isosorbide mononitrate 60 MG 24 hr tablet Commonly known as:  IMDUR Take 1 tablet (60 mg total) by mouth daily. What changed:  You were already taking a medication with the same name, and this prescription was added. Make sure you understand how and when to  take each.   methocarbamol 500 MG tablet Commonly known as:  ROBAXIN Take 1 tablet (500 mg total) by mouth 3 (three) times daily.   metoprolol tartrate 25 MG tablet Commonly known as:  LOPRESSOR Take 1 tablet (25 mg total) by mouth every 6 (six) hours.   nitrofurantoin (macrocrystal-monohydrate) 100 MG capsule Commonly known as:  MACROBID Take 1 capsule (100 mg total) by mouth every 12 (twelve) hours for 5 days.   nitroGLYCERIN 0.4 MG SL tablet Commonly  known as:  NITROSTAT Place 1 tablet (0.4 mg total) under the tongue every 5 (five) minutes as needed.   potassium chloride 10 MEQ CR capsule Commonly known as:  MICRO-K Take 1 capsule (10 mEq total) by mouth daily. Please call [phone] to schedule appointment for additional refills thanks.   rosuvastatin 20 MG tablet Commonly known as:  CRESTOR TAKE 1 TABLET BY MOUTH EVERY DAY What changed:    how much to take  how to take this  when to take this          Signed: Franne FortsBrooke A Meuth, [facility] 09/10/2017, 9:02 AM Pager: [phone] Consults: [phone] Mon-Fri 7:00 am-4:30 pm Sat-Sun 7:00 am-11:30 am

## 2017-09-06 NOTE — Discharge Instructions (Signed)
Nutrition Post Hospital Stay Proper nutrition can help your body recover from illness and injury.   Foods and beverages high in protein, vitamins, and minerals help rebuild muscle loss, promote healing, & reduce fall risk.   In addition to eating healthy foods, a nutrition shake is an easy, delicious way to get the nutrition you need during and after your hospital stay  It is recommended that you continue to drink 2 bottles per day of:       Ensure Enlive or Boost for at least 1 month (30 days) after your hospital stay   Tips for adding a nutrition shake into your routine: As allowed, drink one with vitamins or medications instead of water or juice Enjoy one as a tasty mid-morning or afternoon snack Drink cold or make a milkshake out of it Drink one instead of milk with cereal or snacks Use as a coffee creamer   Available at the following grocery stores and pharmacies:           * Karin Golden * [employer] * [employer]co  * R[employer]          * Walmart * [employer]  * Walgreens      * Target  * [employer]J's   * [facility]S  * [employer]   * [facility] [phone]            For COUPONS visit: www.ensure.com/join or RoleLink.com.br   Suggested Substitutions Ensure Plus = Boost Plus = Carnation Breakfast Essentials = Boost Compact Ensure Active Clear = Boost Breeze Glucerna Shake = Boost Glucose Control = Carnation Breakfast Essentials SUGAR FREE     Rib Fracture A rib fracture is a break or crack in one of the bones of the ribs. The ribs are like a cage that goes around your upper chest. A broken or cracked rib is often painful, but most do not cause other problems. Most rib fractures heal on their own in 1-3 months. Follow these instructions at home:  Avoid activities that cause pain to the injured area. Protect your injured area.  Slowly increase activity as told by your doctor.  Take medicine as told by your doctor.  Put ice on the injured area for the first  1-2 days after you have been treated or as told by your doctor. ? Put ice in a plastic bag. ? Place a towel between your skin and the bag. ? Leave the ice on for 15-20 minutes at a time, every 2 hours while you are awake.  Do deep breathing as told by your doctor. You may be told to: ? Take deep breaths many times a day. ? Cough many times a day while hugging a pillow. ? Use a device (incentive spirometer) to perform deep breathing many times a day.  Drink enough fluids to keep your pee (urine) clear or pale yellow.  Do not wear a rib belt or binder. These do not allow you to breathe deeply. Get help right away if:  You have a fever.  You have trouble breathing.  You cannot stop coughing.  You cough up thick or bloody spit (mucus).  You feel sick to your stomach (nauseous), throw up (vomit), or have belly (abdominal) pain.  Your pain gets worse and medicine does not help. This information is not intended to replace advice given to you by your health care provider. Make sure you discuss any questions you have with your health care provider. Document Released: 02/21/2008 Document Revised:  10/20/2015 Document Reviewed: 07/16/2012 Elsevier Interactive Patient Education  [employer]2018 Elsevier Inc.

## 2017-09-06 NOTE — Progress Notes (Signed)
Trauma Service Note  Subjective: Patient doing well, but still having a lot of pain with deep inspiration.  Has not been very mobile with therpies yet.  Objective: Vital signs in last 24 hours: Temp:  [97.8 F (36.6 C)-98.6 F (37 C)] 97.8 F (36.6 C) (04/12 0400) Pulse Rate:  [63-79] 70 (04/12 0700) Resp:  [11-20] 13 (04/12 0700) BP: (95-165)/(42-98) 129/68 (04/12 0700) SpO2:  [87 %-100 %] 98 % (04/12 0700) Last BM Date: (PTA)  Intake/Output from previous day: 04/11 0701 - 04/12 0700 In: 557 [P.O.:417; I.V.:140] Out: 300 [Urine:300] Intake/Output this shift: No intake/output data recorded.  General: Says her throat hurts.    Lungs: Clear.  CXR without consolidation  But some atelectasis.  Abd: soft, benign  Extremities: Transfer out of the ICU  Neuro: No changes  Lab Results: CBC  Recent Labs    09/04/17 0530 09/06/17 0442  WBC 10.9* 9.2  HGB 11.0* 11.7*  HCT 34.5* 36.0  PLT 173 164   BMET Recent Labs    09/03/17 2205 09/03/17 2214 09/04/17 0530  NA 137 138 137  K 4.2 4.2 4.7  CL 103 102 104  CO2 25  --  22  GLUCOSE 153* 152* 124*  BUN 13 13 11   CREATININE 0.87 0.80 0.87  CALCIUM 9.0  --  9.0   PT/INR No results for input(s): LABPROT, INR in the last 72 hours. ABG No results for input(s): PHART, HCO3 in the last 72 hours.  Invalid input(s): PCO2, PO2  Studies/Results: Dg Chest Port 1 View  Result Date: 09/06/2017 CLINICAL DATA:  Follow-up left rib fractures EXAM: PORTABLE CHEST 1 VIEW COMPARISON:  09/04/2017 FINDINGS: Cardiac shadow is stable. Changes of prior vertebral augmentation are noted. Postsurgical changes in the heart are seen. The lungs are well aerated bilaterally. Mild increase in left basilar atelectasis is noted. No pneumothorax is seen. Known rib fractures are not as well appreciated on today's exam IMPRESSION: Increasing left basilar atelectasis and underlying effusion. Electronically Signed   By: Alcide CleverMark  Lukens M.D.   On:  09/06/2017 07:56   Dg Chest Port 1 View  Result Date: 09/04/2017 CLINICAL DATA:  Status post fall with known left 6, seventh, and tenth rib fractures. EXAM: PORTABLE CHEST 1 VIEW COMPARISON:  Portable chest x-ray and chest CT scan of September 03, 2017 FINDINGS: There is increased density at the left lung base with partial obscuration of the left hemidiaphragm. There is no pneumothorax or mediastinal shift. The right lung is well-expanded and clear. There is stable biapical pleural thickening. The heart is normal in size. There are post CABG changes. There is calcification in the wall of the thoracic aorta. Multiple left lateral rib fractures are demonstrated. IMPRESSION: No evidence of a pneumothorax on the left today. Increased density at the left base consistent with atelectasis and trace pleural effusion. No acute changes elsewhere. Stable appearing left lateral rib fractures where visualized. Thoracic aortic atherosclerosis.  Previous CABG. Electronically Signed   By: David  SwazilandJordan M.D.   On: 09/04/2017 12:27    Anti-infectives: Anti-infectives (From admission, onward)   None      Assessment/Plan: s/p  Advance diet Needs to be more active with PT/OT  LOS: 2 days   Marta LamasJames O. Gae BonWyatt, III, MD, FACS [phone] Trauma Surgeon 09/06/2017

## 2017-09-06 NOTE — Evaluation (Signed)
Occupational Therapy Evaluation Patient Details Name: [name] MRN: [id number] DOB: 10-Jul-1928 Today's Date: 09/06/2017    History of Present Illness 84 yo admitted after fall at home with left rib fx. PMHx: CAD, asthma, CHF, hep A, HTN, HLD, PVD, cVA   Clinical Impression   Pt walks with a cane outdoors and was independent in self care and meal prep prior to admission. Presents with pain as a result of her rib fxs, impaired balance, generalized weakness and impaired cognition likely due to pain meds. Pt requires set up to max assist for ADL and min assist for mod to min assist for mobility. She will need post acute rehab prior to return home. Will follow acutely.    Follow Up Recommendations  SNF;Supervision/Assistance - 24 hour    Equipment Recommendations       Recommendations for Other Services       Precautions / Restrictions Precautions Precautions: Fall Precaution Comments: incontinent Restrictions Weight Bearing Restrictions: No      Mobility Bed Mobility Overal bed mobility: Needs Assistance Bed Mobility: Supine to Sit   Sidelying to sit: Mod assist       General bed mobility comments: cues for sequencing, assist for LEs to EOB, to raise trunk and position hips at EOB with pad  Transfers Overall transfer level: Needs assistance Equipment used: Rolling walker (2 wheeled) Transfers: Sit to/from Stand Sit to Stand: Min assist         General transfer comment: assist to rise and steady, cues for hand placement    Balance Overall balance assessment: Needs assistance   Sitting balance-Leahy Scale: Fair       Standing balance-Leahy Scale: Poor                             ADL either performed or assessed with clinical judgement   ADL Overall ADL's : Needs assistance/impaired Eating/Feeding: Independent;Sitting   Grooming: Wash/dry hands;Sitting;Set up   Upper Body Bathing: Moderate assistance;Sitting   Lower Body Bathing: Maximal  assistance;Sit to/from stand   Upper Body Dressing : Minimal assistance;Sitting   Lower Body Dressing: Maximal assistance;Sit to/from stand   Toilet Transfer: Minimal assistance;Ambulation;BSC;RW   Toileting- Clothing Manipulation and Hygiene: Maximal assistance;Sit to/from stand       Functional mobility during ADLs: Minimal assistance;Rolling walker;Cueing for sequencing;Cueing for safety       Vision Patient Visual Report: No change from baseline       Perception     Praxis      Pertinent Vitals/Pain Pain Assessment: Faces Faces Pain Scale: Hurts even more Pain Location: left chest Pain Descriptors / Indicators: Sore;Guarding;Grimacing Pain Intervention(s): Monitored during session;Premedicated before session;Repositioned     Hand Dominance Right   Extremity/Trunk Assessment Upper Extremity Assessment Upper Extremity Assessment: Generalized weakness       Cervical / Trunk Assessment Cervical / Trunk Assessment: Kyphotic   Communication Communication Communication: No difficulties   Cognition Arousal/Alertness: Awake/alert Behavior During Therapy: WFL for tasks assessed/performed Overall Cognitive Status: Impaired/Different from baseline Area of Impairment: Following commands;Orientation                 Orientation Level: Disoriented to;Place     Following Commands: Follows one step commands with increased time           General Comments       Exercises     Shoulder Instructions      Home Living Family/patient expects to be discharged  to:: Skilled nursing facility Living Arrangements: Alone   Type of Home: House Home Access: Ramped entrance     Home Layout: Two level;Able to live on main level with bedroom/bathroom     Bathroom Shower/Tub: Chief Strategy OfficerTub/shower unit   Bathroom Toilet: Standard     Home Equipment: Grab bars - tub/shower;Cane - single point;Shower seat          Prior Functioning/Environment Level of Independence:  Independent with assistive device(s)        Comments: pt uses cane for gait outside, has assist for cleaning        OT Problem List: Decreased strength;Decreased activity tolerance;Impaired balance (sitting and/or standing);Decreased safety awareness;Decreased knowledge of use of DME or AE;Pain      OT Treatment/Interventions: Self-care/ADL training;DME and/or AE instruction;Patient/family education;Balance training;Cognitive remediation/compensation    OT Goals(Current goals can be found in the care plan section) Acute Rehab OT Goals Patient Stated Goal: return home or potentially ALF OT Goal Formulation: With patient Time For Goal Achievement: 09/20/17 Potential to Achieve Goals: Good ADL Goals Pt Will Perform Grooming: with min guard assist;standing Pt Will Perform Upper Body Dressing: with supervision;sitting Pt Will Perform Lower Body Dressing: with min assist;sit to/from stand Pt Will Transfer to Toilet: with min guard assist;ambulating;bedside commode Pt Will Perform Toileting - Clothing Manipulation and hygiene: with min assist;sit to/from stand  OT Frequency: Min 2X/week   Barriers to D/C:            Co-evaluation              AM-PAC PT "6 Clicks" Daily Activity     Outcome Measure Help from another person eating meals?: None Help from another person taking care of personal grooming?: A Little Help from another person toileting, which includes using toliet, bedpan, or urinal?: Total Help from another person bathing (including washing, rinsing, drying)?: A Lot Help from another person to put on and taking off regular upper body clothing?: A Little Help from another person to put on and taking off regular lower body clothing?: A Lot 6 Click Score: 15   End of Session Equipment Utilized During Treatment: Gait belt;Rolling walker;Oxygen  Activity Tolerance: Patient tolerated treatment well Patient left: in chair;with call bell/phone within reach;with chair  alarm set;with family/visitor present  OT Visit Diagnosis: Unsteadiness on feet (R26.81);Other abnormalities of gait and mobility (R26.89);Pain;Muscle weakness (generalized) (M62.81);Other symptoms and signs involving cognitive function                Time: [id number] OT Time Calculation (min): 25 min Charges:  OT General Charges $OT Visit: 1 Visit OT Evaluation $OT Eval Moderate Complexity: 1 Mod OT Treatments $Self Care/Home Management : 8-22 mins G-Codes:     09/06/2017 Martie RoundJulie Corinthian Mizrahi, OTR/L Pager: [phone]  Iran PlanasMayberry, [place] BailiffJulie Lynn 09/06/2017, 1:20 PM

## 2017-09-06 NOTE — Progress Notes (Signed)
Transferred pt to 4NP9 without incident.

## 2017-09-07 MED ORDER — BOOST / RESOURCE BREEZE PO LIQD CUSTOM
1.0000 | Freq: Two times a day (BID) | ORAL | Status: DC
  Administered 2017-09-07 – 2017-09-10 (×6): 1 via ORAL
  Filled 2017-09-07: qty 1

## 2017-09-07 NOTE — Progress Notes (Signed)
Trauma Service Note  Subjective: Patient doing well, but still having a lot of pain with deep inspiration.  Has not been very mobile with therpies yet. Pulling ~700 on IS  Objective: Vital signs in last 24 hours: Temp:  [97.7 F (36.5 C)-98.7 F (37.1 C)] 97.7 F (36.5 C) (04/13 0357) Pulse Rate:  [71-76] 74 (04/13 0400) Resp:  [11-27] 13 (04/13 0400) BP: (124-153)/(53-73) 144/57 (04/13 0400) SpO2:  [91 %-99 %] 96 % (04/13 0400) Last BM Date: (PTA)  Intake/Output from previous day: 04/12 0701 - 04/13 0700 In: 220 [P.O.:220] Out: 400 [Urine:400] Intake/Output this shift: No intake/output data recorded.  General: NAD, comfortable; splinting with pillow for deep breaths    Lungs: Clear.  CXR without consolidation  But some atelectasis.  Abd: soft, benign  Neuro: No changes  Lab Results: CBC  Recent Labs    09/06/17 0442  WBC 9.2  HGB 11.7*  HCT 36.0  PLT 164    Studies/Results: Dg Chest Port 1 View  Result Date: 09/06/2017 CLINICAL DATA:  Follow-up left rib fractures EXAM: PORTABLE CHEST 1 VIEW COMPARISON:  09/04/2017 FINDINGS: Cardiac shadow is stable. Changes of prior vertebral augmentation are noted. Postsurgical changes in the heart are seen. The lungs are well aerated bilaterally. Mild increase in left basilar atelectasis is noted. No pneumothorax is seen. Known rib fractures are not as well appreciated on today's exam IMPRESSION: Increasing left basilar atelectasis and underlying effusion. Electronically Signed   By: Alcide CleverMark  Lukens M.D.   On: 09/06/2017 07:56    Anti-infectives: Anti-infectives (From admission, onward)   None      Assessment/Plan: s/p  Advance diet Needs to be more active with PT/OT  Continue aggressive pulmonary toilet, multimodal pain control Out of bed in day to chair, ambulate with assistance   LOS: 3 days   Stephanie Couphristopher M. Cliffton AstersWhite, M.D. [facility]

## 2017-09-08 ENCOUNTER — Encounter (HOSPITAL_COMMUNITY): Payer: Self-pay

## 2017-09-08 ENCOUNTER — Inpatient Hospital Stay (HOSPITAL_COMMUNITY): Payer: Medicare Other

## 2017-09-08 DIAGNOSIS — I2510 Atherosclerotic heart disease of native coronary artery without angina pectoris: Secondary | ICD-10-CM

## 2017-09-08 DIAGNOSIS — I10 Essential (primary) hypertension: Secondary | ICD-10-CM

## 2017-09-08 DIAGNOSIS — I4891 Unspecified atrial fibrillation: Secondary | ICD-10-CM

## 2017-09-08 LAB — CBC WITH DIFFERENTIAL/PLATELET
Basophils Absolute: 0 10*3/uL (ref 0.0–0.1)
Basophils Relative: 0 %
Eosinophils Absolute: 0.1 10*3/uL (ref 0.0–0.7)
Eosinophils Relative: 1 %
HCT: 36.8 % (ref 36.0–46.0)
Hemoglobin: 12.5 g/dL (ref 12.0–15.0)
Lymphocytes Relative: 16 %
Lymphs Abs: 1.9 10*3/uL (ref 0.7–4.0)
MCH: 30.6 pg (ref 26.0–34.0)
MCHC: 34 g/dL (ref 30.0–36.0)
MCV: 90.2 fL (ref 78.0–100.0)
Monocytes Absolute: 1.1 10*3/uL — ABNORMAL HIGH (ref 0.1–1.0)
Monocytes Relative: 10 %
Neutro Abs: 8.4 10*3/uL — ABNORMAL HIGH (ref 1.7–7.7)
Neutrophils Relative %: 73 %
Platelets: 194 10*3/uL (ref 150–400)
RBC: 4.08 MIL/uL (ref 3.87–5.11)
RDW: 12.4 % (ref 11.5–15.5)
WBC: 11.5 10*3/uL — ABNORMAL HIGH (ref 4.0–10.5)

## 2017-09-08 LAB — URINALYSIS, ROUTINE W REFLEX MICROSCOPIC
Bilirubin Urine: NEGATIVE
Glucose, UA: NEGATIVE mg/dL
Ketones, ur: 5 mg/dL — AB
Nitrite: POSITIVE — AB
Protein, ur: NEGATIVE mg/dL
Specific Gravity, Urine: 1.014 (ref 1.005–1.030)
Squamous Epithelial / LPF: NONE SEEN
pH: 5 (ref 5.0–8.0)

## 2017-09-08 LAB — BASIC METABOLIC PANEL
Anion gap: 12 (ref 5–15)
BUN: 12 mg/dL (ref 6–20)
CO2: 25 mmol/L (ref 22–32)
Calcium: 9.2 mg/dL (ref 8.9–10.3)
Chloride: 99 mmol/L — ABNORMAL LOW (ref 101–111)
Creatinine, Ser: 0.8 mg/dL (ref 0.44–1.00)
GFR calc Af Amer: >60 mL/min (ref >60)
GFR calc non Af Amer: >60 mL/min (ref >60)
Glucose, Bld: 139 mg/dL — ABNORMAL HIGH (ref 65–99)
Potassium: 3.7 mmol/L (ref 3.5–5.1)
Sodium: 136 mmol/L (ref 135–145)

## 2017-09-08 MED ORDER — METOPROLOL TARTRATE 25 MG PO TABS
25.0000 mg | ORAL_TABLET | Freq: Four times a day (QID) | ORAL | Status: DC
  Administered 2017-09-08 – 2017-09-10 (×6): 25 mg via ORAL
  Filled 2017-09-08 (×8): qty 1

## 2017-09-08 NOTE — Progress Notes (Signed)
Patient ID: [name], female   DOB: [birth year], 84 y.o.   MRN: [id number]       Subjective: Patient resting up in a chair.  Only pulling about 500-700 on IS.  Has some pain.  Confusion noted this morning has resolved.  She is A&O x3.  Objective: Vital signs in last 24 hours: Temp:  [97.5 F (36.4 C)-98.6 F (37 C)] 97.6 F (36.4 C) (04/14 0300) Pulse Rate:  [72-100] 100 (04/13 2000) Resp:  [13-22] 22 (04/13 2000) BP: (122-151)/(49-50) 151/50 (04/13 2000) SpO2:  [81 %-96 %] 95 % (04/13 2000) Last BM Date: (PTA)  Intake/Output from previous day: No intake/output data recorded. Intake/Output this shift: No intake/output data recorded.  PE: Gen: NAD, sitting in a chair  Heart: regular Lungs: CTAB, decreased BS at base on left side.  Tender over left side of chest Abd: soft, NT, ND, +BS  Lab Results:  Recent Labs    09/06/17 0442  WBC 9.2  HGB 11.7*  HCT 36.0  PLT 164   BMET No results for input(s): NA, K, CL, CO2, GLUCOSE, BUN, CREATININE, CALCIUM in the last 72 hours. PT/INR No results for input(s): LABPROT, INR in the last 72 hours. CMP     Component Value Date/Time   NA 137 09/04/2017 0530   NA 141 10/19/2016 1448   K 4.7 09/04/2017 0530   CL 104 09/04/2017 0530   CO2 22 09/04/2017 0530   GLUCOSE 124 (H) 09/04/2017 0530   BUN 11 09/04/2017 0530   BUN 15 10/19/2016 1448   CREATININE 0.87 09/04/2017 0530   CREATININE 0.88 04/24/2017 1037   CALCIUM 9.0 09/04/2017 0530   PROT 6.2 (L) 09/03/2017 2205   PROT 6.3 10/19/2016 1448   ALBUMIN 3.6 09/03/2017 2205   ALBUMIN 4.0 10/19/2016 1448   AST 20 09/03/2017 2205   ALT 14 09/03/2017 2205   ALKPHOS 61 09/03/2017 2205   BILITOT 0.5 09/03/2017 2205   BILITOT 0.3 10/19/2016 1448   GFRNONAA 59 (L) 09/04/2017 0530   GFRNONAA 70 06/28/2015 1629   GFRAA >60 09/04/2017 0530   GFRAA 81 06/28/2015 1629   Lipase     Component Value Date/Time   LIPASE 17 11/20/2008 0340       Studies/Results: No results  found.  Anti-infectives: Anti-infectives (From admission, onward)   None       Assessment/Plan Fall L rib FX 6-8, 10 - pulm toilet FEN - soft diet, taking scheduled robaxin and tylenol.  Tramadol and 2.5mg  os oxy prn VTE - Lovenox Dispo - SNF when bed available   LOS: 4 days    Letha CapeKelly E Tyeisha Dinan , [facility] 09/08/2017, 8:10 AM Pager: [phone]

## 2017-09-08 NOTE — Progress Notes (Signed)
Patient woke up confused and did not know where she was. She pulled out her IV. Patient is reoriented and bed alarm stays on for safety. Will cont to monitor.

## 2017-09-08 NOTE — Progress Notes (Addendum)
MD paged due to patient getting out of bed multiple times, pulling at lines, and tubes. Nurse attempted to reorient patient countless times while charge nurse also came to accompany. MD, Tseui stated, "Your floor has paged me at least six times and to stop calling me." Nurse stated that [facility] PCU has not paged him all night and MD stated that we have and to order whatever and hung up on nurse. Charge nurse notified.

## 2017-09-08 NOTE — Progress Notes (Signed)
Patient is confused and wants to leave the hospital. Patient is reoriented to place and is assisted up to the chair. Patient is given coffee, TV turned on and call bell is placed in reach. Chair alarm is on for safety. Will continue to closely monitor.

## 2017-09-08 NOTE — Consult Note (Signed)
Cardiology Consultation:   Patient ID: VYLETTE STRUBEL; [id number]; 08-07-1928   Admit date: 09/03/2017 Date of Consult: 09/08/2017  Primary Care Provider: Donita Brooks, MD Primary Cardiologist: Dr Elease Hashimoto   Patient Profile:   [name] is a 84 y.o. female with a hx of CAD who is being seen today for the evaluation of new onset atrial fibrillation at the request of Dr Cliffton Asters  History of Present Illness:   [name] is admitted after a mechanical fall and hip fracture.  [name] hospitalization has been notable for some confusion.  [name] is with [name] today and reports that the patient has had recent unsteadiness and falls.  She does not have a known history of atrial fibrillation. Today, she has converted from sinus rhythm to afib with RVR.  She has chest wall discomfort due to rib fractures as well as associated SOB but no new symptoms.  Past Medical History:  Diagnosis Date  . Allergy   . Asthma   . CAD (coronary artery disease)    s/p CABG x 5 in 1997 & CABG x 3 in 2010; Last cath in April of 2010 showed grafts to be patent. EF is 40 to 45%  . CHF (congestive heart failure) (HCC)    EF is 40 to 45%  . Depression    following bypass surgery  . Diverticulosis   . Endometriosis   . GERD (gastroesophageal reflux disease)   . Hepatitis A   . Hiatal hernia   . History of cerebrovascular disease   . History of vertebral fracture    at T8  . Hyperlipemia   . Hypertension   . LBP (low back pain)   . Mixed incontinence urge and stress 04/22/2013  . Nocturia 04/22/2013  . Pleural effusion    post surgical  . PVD (peripheral vascular disease) (HCC)    S/P right CEA  . Sleep apnea   . Stroke [facility]) 2010    Past Surgical History:  Procedure Laterality Date  . APPENDECTOMY    . CARDIAC CATHETERIZATION  April 2010   Grafts were patent. EF is 40 to 45%  . CAROTID ENDARTERECTOMY     right  . CORONARY ARTERY BYPASS GRAFT  [id number]   Original surgery in 1997 x 5; Redo CABG x 3 in  2010 including free RIMA to distal LAD, SVG to OM and SVG to left posterolateral  . KNEE SURGERY     left  . LEFT HEART CATHETERIZATION WITH CORONARY/GRAFT ANGIOGRAM N/A 03/12/2013   Procedure: LEFT HEART CATHETERIZATION WITH Isabel Caprice;  Surgeon: Peter M Swaziland, MD;  Location: [facility] CATH LAB;  Service: Cardiovascular;  Laterality: N/A;  . PARTIAL COLECTOMY     due to colon abcess      Inpatient Medications: Scheduled Meds: . acetaminophen  1,000 mg Oral TID  . enoxaparin (LOVENOX) injection  30 mg Subcutaneous Q24H  . feeding supplement  1 Container Oral BID BM  . isosorbide mononitrate  60 mg Oral Daily  . mouth rinse  15 mL Mouth Rinse BID  . methocarbamol  500 mg Oral TID  . metoprolol tartrate  25 mg Oral Q6H   Continuous Infusions:  PRN Meds: fentaNYL (SUBLIMAZE) injection, ondansetron **OR** ondansetron (ZOFRAN) IV, oxyCODONE, traMADol  Allergies:    Allergies  Allergen Reactions  . Metoprolol Other (See Comments)    allopoecia  . Phenergan [Promethazine Hcl] Other (See Comments)    Facial numbness  . Prochlorperazine Edisylate Anaphylaxis  . Zetia [Ezetimibe] Nausea  Only and Other (See Comments)    Abdominal pain  . Compazine [Prochlorperazine]     Facial  Paralysis       . Sulfur Other (See Comments)    paralyzes  face   . Niacin Rash  . Sulfonamide Derivatives Nausea And Vomiting    Social History:   Social History   Socioeconomic History  . Marital status: Married    Spouse name: Not on file  . Number of children: 0  . Years of education: Not on file  . Highest education level: Not on file  Occupational History  . Occupation: retired Magazine features editorteacher    Employer: RETIRED  Social Needs  . Financial resource strain: Not on file  . Food insecurity:    Worry: Not on file    Inability: Not on file  . Transportation needs:    Medical: Not on file    Non-medical: Not on file  Tobacco Use  . Smoking status: Never Smoker  . Smokeless tobacco:  Never Used  Substance and Sexual Activity  . Alcohol use: No    Alcohol/week: 0.0 oz  . Drug use: No  . Sexual activity: Never  Lifestyle  . Physical activity:    Days per week: Not on file    Minutes per session: Not on file  . Stress: Not on file  Relationships  . Social connections:    Talks on phone: Not on file    Gets together: Not on file    Attends religious service: Not on file    Active member of club or organization: Not on file    Attends meetings of clubs or organizations: Not on file    Relationship status: Not on file  . Intimate partner violence:    Fear of current or ex partner: Not on file    Emotionally abused: Not on file    Physically abused: Not on file    Forced sexual activity: Not on file  Other Topics Concern  . Not on file  Social History Narrative  . Not on file    Family History:    Family History  Problem Relation Age of Onset  . Heart attack Mother   . Diabetes Mother   . Heart disease Mother   . Rheum arthritis Mother   . Heart attack Father   . Allergies Father   . Diabetes Sister   . Stroke Sister   . Heart disease Sister   . Dementia Brother   . Healthy Sister   . Other Brother        LUNG ISSUES  . Other Brother        LUNG ISSUES  . Other Brother        LUNG ISSUES  . Diabetes Maternal Grandmother   . Diabetes Paternal Grandmother   . Colon cancer Neg Hx      ROS:  Please see the history of present illness.   All other ROS reviewed and negative.     Physical Exam/Data:   Vitals:   09/08/17 0300 09/08/17 0800 09/08/17 0900 09/08/17 1200  BP:  (!) 129/57  125/68  Pulse:  84  (!) 163  Resp:  17  (!) 27  Temp: 97.6 F (36.4 C) 98.8 F (37.1 C) (!) 97.3 F (36.3 C) 97.7 F (36.5 C)  TempSrc: Oral Oral  Oral  SpO2:  99%  95%  Weight:      Height:       No intake or output data in  the 24 hours ending 09/08/17 1345 Filed Weights   09/05/17 0600  Weight: 119 lb (54 kg)   Body mass index is 19.8 kg/m.    General:  Elderly and frail, NAD HEENT: normal Lymph: no adenopathy Neck: no JVD Endocrine:  No thryomegaly Vascular: No carotid bruits; FA pulses 2+ bilaterally without bruits  Cardiac:  Tachycardic irregular rhythm Lungs:  clear to auscultation bilaterally, no wheezing, rhonchi or rales  Abd: soft, nontender, no hepatomegaly  Ext: no edema Musculoskeletal:   Diffuse muscle atrophy Skin: warm and dry  Neuro:  CNs 2-12 intact, no focal abnormalities noted Psych:  Normal affect   EKG:  The EKG was personally reviewed and demonstrates:  afib with RVR, LBBB (Old) Telemetry:  Telemetry was personally reviewed and demonstrates:  afib with RVR, V rates 140s  Relevant CV Studies: Echo 07/20/15 reveals EF 55%, mild LA enlargement, moderate TR, moderate pulm pressure elevation,  Dr Harvie Bridge notes reviewed  Laboratory Data:  Chemistry Recent Labs  Lab 09/03/17 2205 09/03/17 2214 09/04/17 0530 09/08/17 1250  NA 137 138 137 136  K 4.2 4.2 4.7 3.7  CL 103 102 104 99*  CO2 25  --  22 25  GLUCOSE 153* 152* 124* 139*  BUN 13 13 11 12   CREATININE 0.87 0.80 0.87 0.80  CALCIUM 9.0  --  9.0 9.2  GFRNONAA 59*  --  59* >60  GFRAA >60  --  >60 >60  ANIONGAP 9  --  11 12    Recent Labs  Lab 09/03/17 2205  PROT 6.2*  ALBUMIN 3.6  AST 20  ALT 14  ALKPHOS 61  BILITOT 0.5   Hematology Recent Labs  Lab 09/04/17 0530 09/06/17 0442 09/08/17 1250  WBC 10.9* 9.2 11.5*  RBC 3.68* 3.81* 4.08  HGB 11.0* 11.7* 12.5  HCT 34.5* 36.0 36.8  MCV 93.8 94.5 90.2  MCH 29.9 30.7 30.6  MCHC 31.9 32.5 34.0  RDW 12.8 12.9 12.4  PLT 173 164 194   Cardiac EnzymesNo results for input(s): TROPONINI in the last 168 hours. No results for input(s): TROPIPOC in the last 168 hours.  BNPNo results for input(s): BNP, PROBNP in the last 168 hours.  DDimer No results for input(s): DDIMER in the last 168 hours.  Radiology/Studies:  Dg Chest Port 1 View  Result Date: 09/06/2017 CLINICAL DATA:  Follow-up  left rib fractures EXAM: PORTABLE CHEST 1 VIEW COMPARISON:  09/04/2017 FINDINGS: Cardiac shadow is stable. Changes of prior vertebral augmentation are noted. Postsurgical changes in the heart are seen. The lungs are well aerated bilaterally. Mild increase in left basilar atelectasis is noted. No pneumothorax is seen. Known rib fractures are not as well appreciated on today's exam IMPRESSION: Increasing left basilar atelectasis and underlying effusion. Electronically Signed   By: Alcide Clever M.D.   On: 09/06/2017 07:56    Assessment and Plan:   1. New onset afib The patient has afib with RVR.  This is likely due to [name] acute medical illness.  Chads2vasc score is at least 7.  She is currently a poor candidate for anticoagulation with recent falls, trauma, and current confusion. Will start metoprolol 25mg  qh6 for rate control.  Though she has a h/o intolerance due to alopecia, she is willing to try this medicine for the short term. We could consider amiodarone if [name] afib persists. [name] last echo 2017 revealed preserved EF.  Given rib fractures, I am reluctant to update chest wall echo at this time due to anticipated discomfort  of the procedure.  2. CAD No ischemic symptoms No changes  3. HTN Stable No change required today  Hopefully she will convert to sinus rhythm.  General cardiology to follow with you for rate control in the interim.   For questions or updates, please contact [facility] Please consult [email or web address] for contact info under Cardiology/STEMI.   Signed, Hillis Range, MD  09/08/2017 1:45 PM

## 2017-09-08 NOTE — Social Work (Signed)
CSW met with niece and pt at bedside, pt was napping upon arrival however woke up and conversed with CSW. Pt niece and pt have been given SNF options and CSW requested that they choose at least three options so that in case there is not beds available they have back up. Both pt and pt niece verbalized understanding and will f/u with trauma CSW tomorrow with choices.   Alexander Mt, [place] Work [phone]

## 2017-09-09 DIAGNOSIS — I480 Paroxysmal atrial fibrillation: Secondary | ICD-10-CM

## 2017-09-09 DIAGNOSIS — R000 Tachycardia, unspecified: Secondary | ICD-10-CM

## 2017-09-09 LAB — GLUCOSE, CAPILLARY
Glucose-Capillary: 105 mg/dL — ABNORMAL HIGH (ref 65–99)
Glucose-Capillary: 96 mg/dL (ref 65–99)

## 2017-09-09 MED ORDER — NITROFURANTOIN MONOHYD MACRO 100 MG PO CAPS
100.0000 mg | ORAL_CAPSULE | Freq: Two times a day (BID) | ORAL | Status: DC
  Administered 2017-09-09 – 2017-09-10 (×2): 100 mg via ORAL
  Filled 2017-09-09 (×4): qty 1

## 2017-09-09 NOTE — Progress Notes (Signed)
CSW spoke with pt's niece, [name] at [phone]. Informed pt's niece that [facility] has [facility]. [facility]'s [facility] will be at [facility] tomorrow, 09/10/17, at 2 pm to sign paperwork.  Montine CircleKelsy Tiannah Greenly, Silverio LayLCSWA [facility]  [phone]

## 2017-09-09 NOTE — Progress Notes (Signed)
Physical Therapy Treatment Patient Details Name: ATIANNA HAIDAR MRN: [id number] DOB: 1929-03-26 Today's Date: 09/09/2017    History of Present Illness 84 yo admitted after fall at home with left rib fx. PMHx: CAD, asthma, CHF, hep A, HTN, HLD, PVD, cVA    PT Comments    Pt very anxious and fearful of falling this session. Pt demonstrated bed mobility mod to max assist and transfers min assist with RW. Noted increased dizziness with position changes which correlated with decrease in BP (see below). Pt  following commands ~50% of the time requiring mod to max VCs and increased time. Current plan remains appropriate. Will continue to follow acutely and progress as tolerated.   BP  116/62 Supine 95/50 Sit at EOB 106/51 EOB with LE exercises 110/53 Supine at end of session    Follow Up Recommendations  SNF;Supervision/Assistance - 24 hour     Equipment Recommendations  Rolling walker with 5" wheels;3in1 (PT)    Recommendations for Other Services       Precautions / Restrictions Precautions Precautions: Fall Precaution Comments: orthostatic, incontinent Restrictions Weight Bearing Restrictions: No    Mobility  Bed Mobility Overal bed mobility: Needs Assistance Bed Mobility: Supine to Sit Rolling: Max assist Sidelying to sit: Mod assist       General bed mobility comments: pt very hesistant to move and grabbing onto bed rails to resist movement due to fear of pain and fear of falling. required max physical assist to rolll and mod physical assist to elevate trunk. pt says she feels a little dizzy once in sitting at EOB which takes a couple minutes to subside.  Transfers Overall transfer level: Needs assistance Equipment used: Rolling walker (2 wheeled) Transfers: Sit to/from Stand Sit to Stand: Min assist         General transfer comment: min assist to power up, to steady pt, and to stabilize RW. pt required 2 trials to achieve upright standing.   Ambulation/Gait              General Gait Details: pt declined this session   Stairs             Wheelchair Mobility    Modified Rankin (Stroke Patients Only)       Balance Overall balance assessment: Needs assistance Sitting-balance support: Bilateral upper extremity supported;Single extremity supported;Feet supported Sitting balance-Leahy Scale: Fair       Standing balance-Leahy Scale: Poor Standing balance comment: requires bil UE support and physical assist                            Cognition Arousal/Alertness: Awake/alert Behavior During Therapy: Anxious Overall Cognitive Status: Impaired/Different from baseline Area of Impairment: Following commands;Safety/judgement;Awareness                       Following Commands: Follows one step commands consistently;Follows one step commands inconsistently Safety/Judgement: Decreased awareness of safety;Decreased awareness of deficits Awareness: Emergent   General Comments: pt extremely anxious throughout session and demonstrates command following ~50% of the time with mod to max verbal cues      Exercises General Exercises - Lower Extremity Ankle Circles/Pumps: 15 reps;AAROM;AROM;Both;Seated Long Arc Quad: AROM;Both;5 reps;Seated Hip Flexion/Marching: AROM;5 reps;Both;Seated    General Comments General comments (skin integrity, edema, etc.): noted orthostatics. other VSS. O2 >90% on RA. Noted diplopia. Pt's niece present throughout session.       Pertinent Vitals/Pain Pain Assessment: Faces Faces Pain  Scale: Hurts even more Pain Location: left chest Pain Descriptors / Indicators: Sore;Guarding;Grimacing Pain Intervention(s): Monitored during session;Limited activity within patient's tolerance;Repositioned    Home Living                      Prior Function            PT Goals (current goals can now be found in the care plan section) Acute Rehab PT Goals Patient Stated Goal: return home or  potentially ALF PT Goal Formulation: With patient Time For Goal Achievement: 09/19/17 Potential to Achieve Goals: Good Progress towards PT goals: Progressing toward goals    Frequency    Min 3X/week      PT Plan Current plan remains appropriate    Co-evaluation              AM-PAC PT "6 Clicks" Daily Activity  Outcome Measure  Difficulty turning over in bed (including adjusting bedclothes, sheets and blankets)?: Unable Difficulty moving from lying on back to sitting on the side of the bed? : Unable Difficulty sitting down on and standing up from a chair with arms (e.g., wheelchair, bedside commode, etc,.)?: Unable Help needed moving to and from a bed to chair (including a wheelchair)?: A Lot Help needed walking in hospital room?: A Lot Help needed climbing 3-5 steps with a railing? : A Lot 6 Click Score: 9    End of Session Equipment Utilized During Treatment: Gait belt Activity Tolerance: Other (comment)(pt limited by anxiousness) Patient left: in bed;with call bell/phone within reach;with chair alarm set;with restraints reapplied;Other (comment)(reapplied 2L O2) Nurse Communication: Mobility status;Other (comment)(orthostatics) PT Visit Diagnosis: Other abnormalities of gait and mobility (R26.89);Muscle weakness (generalized) (M62.81)     Time: [id number] PT Time Calculation (min) (ACUTE ONLY): 26 min  Charges:                       G Codes:       Barrie DunkerEmily Ewin Rehberg, SPT   Barrie Dunkermily Katey Barrie 09/09/2017, 3:58 PM

## 2017-09-09 NOTE — Progress Notes (Signed)
[facility] Progress Note     Subjective: CC- confused Niece at bedside, states that Ms. Archie BalboaJolly continues to be confused. She has not complained of any pain. Denies SOB.  U/a looks like she has a UTI. Started on metoprolol for a fib yesterday per cardiology, appears to be maintaining NSR.  Objective: Vital signs in last 24 hours: Temp:  [97.3 F (36.3 C)-98.7 F (37.1 C)] 97.7 F (36.5 C) (04/15 0400) Pulse Rate:  [81-163] 81 (04/15 0400) Resp:  [17-27] 17 (04/15 0400) BP: (120-142)/(46-68) 130/49 (04/15 0400) SpO2:  [92 %-96 %] 96 % (04/15 0400) Last BM Date: (PTA)  Intake/Output from previous day: 04/14 0701 - 04/15 0700 In: 300 [P.O.:300] Out: -  Intake/Output this shift: No intake/output data recorded.  PE: Gen:  Alert, NAD HEENT: EOM's intact, pupils equal and round Card:  RRR, no M/G/R heard Pulm:  CTAB, no W/R/R, effort normal Abd: Soft, NT/ND, +BS, no HSM, no hernia Ext:  Calves soft and nontedner Psych: alert and oriented to person and time, [facility] that [facility] Skin: no rashes noted, warm and dry  Lab Results:  Recent Labs    09/08/17 1250  WBC 11.5*  HGB 12.5  HCT 36.8  PLT 194   BMET Recent Labs    09/08/17 1250  NA 136  K 3.7  CL 99*  CO2 25  GLUCOSE 139*  BUN 12  CREATININE 0.80  CALCIUM 9.2   PT/INR No results for input(s): LABPROT, INR in the last 72 hours. CMP     Component Value Date/Time   NA 136 09/08/2017 1250   NA 141 10/19/2016 1448   K 3.7 09/08/2017 1250   CL 99 (L) 09/08/2017 1250   CO2 25 09/08/2017 1250   GLUCOSE 139 (H) 09/08/2017 1250   BUN 12 09/08/2017 1250   BUN 15 10/19/2016 1448   CREATININE 0.80 09/08/2017 1250   CREATININE 0.88 04/24/2017 1037   CALCIUM 9.2 09/08/2017 1250   PROT 6.2 (L) 09/03/2017 2205   PROT 6.3 10/19/2016 1448   ALBUMIN 3.6 09/03/2017 2205   ALBUMIN 4.0 10/19/2016 1448   AST 20 09/03/2017 2205   ALT 14 09/03/2017 2205   ALKPHOS 61 09/03/2017 2205    BILITOT 0.5 09/03/2017 2205   BILITOT 0.3 10/19/2016 1448   GFRNONAA >60 09/08/2017 1250   GFRNONAA 70 06/28/2015 1629   GFRAA >60 09/08/2017 1250   GFRAA 81 06/28/2015 1629   Lipase     Component Value Date/Time   LIPASE 17 11/20/2008 0340       Studies/Results: Dg Chest Port 1 View  Result Date: 09/08/2017 CLINICAL DATA:  Tachycardia, history asthma, coronary artery disease, hypertension, GERD, CHF EXAM: PORTABLE CHEST 1 VIEW COMPARISON:  Portable exam 1232 hours compared to 09/06/2017 FINDINGS: Normal heart size post CABG. Atherosclerotic calcification aorta. Mediastinal contours and pulmonary vascularity normal. Lungs appear emphysematous with atelectasis versus consolidation in LEFT lower lobe unchanged. Probable small LEFT pleural effusion. Remaining lungs clear. No pneumothorax. Bones demineralized with prior spinal augmentation procedure at midthoracic spine. IMPRESSION: Post CABG. COPD changes with persistent atelectasis versus consolidation in LEFT lower lobe. Electronically Signed   By: Ulyses SouthwardMark  Boles M.D.   On: 09/08/2017 14:36    Anti-infectives: Anti-infectives (From admission, onward)   None       Assessment/Plan Fall L rib FX 6-8, 10- pain control and pulmonary toilet New onset Afib - appreciate cardiology input, started on metoprolol 25mg  q6 UTI - check urine culture,  start nitrofurantoin HTN CAD HLD   ID - nitrofurantoin 4/15>> FEN - HH diet, Boost VTE - SCDs, lovenox Foley - none Follow up - PCP  Plan - Check urine culture and start abx for UTI. D/c narcotics. SNF.   LOS: 5 days    Franne Forts , [facility] 09/09/2017, 8:54 AM Pager: [phone] Consults: [phone] Mon-Fri 7:00 am-4:30 pm Sat-Sun 7:00 am-11:30 am

## 2017-09-09 NOTE — Progress Notes (Signed)
Progress Note  Patient Name: [name] Date of Encounter: 09/09/2017  Primary Cardiologist: No primary care provider on file.  Subjective   Confused[place]   Inpatient Medications    Scheduled Meds: . acetaminophen  1,000 mg Oral TID  . enoxaparin (LOVENOX) injection  30 mg Subcutaneous Q24H  . feeding supplement  1 Container Oral BID BM  . isosorbide mononitrate  60 mg Oral Daily  . mouth rinse  15 mL Mouth Rinse BID  . methocarbamol  500 mg Oral TID  . metoprolol tartrate  25 mg Oral Q6H  . nitrofurantoin (macrocrystal-monohydrate)  100 mg Oral Q12H   Continuous Infusions:  PRN Meds: ondansetron **OR** ondansetron (ZOFRAN) IV   Vital Signs    Vitals:   09/09/17 0700 09/09/17 0755 09/09/17 0800 09/09/17 0900  BP:   (!) 153/53   Pulse:      Resp: 15  18 (!) 22  Temp:  98.5 F (36.9 C)    TempSrc:      SpO2:      Weight:      Height:        Intake/Output Summary (Last 24 hours) at 09/09/2017 0935 Last data filed at 09/08/2017 1908 Gross per 24 hour  Intake 240 ml  Output -  Net 240 ml   Filed Weights   09/05/17 0600  Weight: 119 lb (54 kg)    Telemetry    SR (old LBBB) - Personally Reviewed  ECG    N/a - Personally Reviewed  Physical Exam   General: Thin, frail older W female appearing in no acute distress. Head: Normocephalic, atraumatic.  Neck: Supple, no JVD. Lungs:  Resp regular and unlabored, CTA. Heart: RRR, S1, S2 no murmur. Abdomen: Soft, non-tender, non-distended with normoactive bowel sounds.  Extremities: No clubbing, cyanosis, edema. Distal pedal pulses are 2+ bilaterally. Neuro: Alert and oriented to self. Moves all extremities spontaneously. Psych: Normal affect.  Labs    Chemistry Recent Labs  Lab 09/03/17 2205 09/03/17 2214 09/04/17 0530 09/08/17 1250  NA 137 138 137 136  K 4.2 4.2 4.7 3.7  CL 103 102 104 99*  CO2 25  --  22 25  GLUCOSE 153* 152* 124* 139*  BUN 13 13 11 12   CREATININE  0.87 0.80 0.87 0.80  CALCIUM 9.0  --  9.0 9.2  PROT 6.2*  --   --   --   ALBUMIN 3.6  --   --   --   AST 20  --   --   --   ALT 14  --   --   --   ALKPHOS 61  --   --   --   BILITOT 0.5  --   --   --   GFRNONAA 59*  --  59* >60  GFRAA >60  --  >60 >60  ANIONGAP 9  --  11 12     Hematology Recent Labs  Lab 09/04/17 0530 09/06/17 0442 09/08/17 1250  WBC 10.9* 9.2 11.5*  RBC 3.68* 3.81* 4.08  HGB 11.0* 11.7* 12.5  HCT 34.5* 36.0 36.8  MCV 93.8 94.5 90.2  MCH 29.9 30.7 30.6  MCHC 31.9 32.5 34.0  RDW 12.8 12.9 12.4  PLT 173 164 194    Cardiac EnzymesNo results for input(s): TROPONINI in the last 168 hours. No results for input(s): TROPIPOC in the last 168 hours.   BNPNo results for input(s): BNP, PROBNP in the last 168 hours.  DDimer No results for input(s): DDIMER in the last 168 hours.    Radiology    Dg Chest Port 1 View  Result Date: 09/08/2017 CLINICAL DATA:  Tachycardia, history asthma, coronary artery disease, hypertension, GERD, CHF EXAM: PORTABLE CHEST 1 VIEW COMPARISON:  Portable exam 1232 hours compared to 09/06/2017 FINDINGS: Normal heart size post CABG. Atherosclerotic calcification aorta. Mediastinal contours and pulmonary vascularity normal. Lungs appear emphysematous with atelectasis versus consolidation in LEFT lower lobe unchanged. Probable small LEFT pleural effusion. Remaining lungs clear. No pneumothorax. Bones demineralized with prior spinal augmentation procedure at midthoracic spine. IMPRESSION: Post CABG. COPD changes with persistent atelectasis versus consolidation in LEFT lower lobe. Electronically Signed   By: Ulyses Southward M.D.   On: 09/08/2017 14:36    Cardiac Studies   N/a   Patient Profile     84 y.o. female with PMH of CAD s/p CABG, Chronic systolic HF, HTN, HL, PVD, GERD and Stroke who presented with fall and rib fractures. Developed new onset Afib RVR.   Assessment & Plan    1. New onset AFib RVR: Placed on metoprolol yesterday and  now back in SR. Refused am meds, confused. [place]. Has a Chadsvasc of 7, but not a candidate for [facility] at this time given her falls and AMS. Would continue to BB therapy  2. CAD: Stable, no angina symptoms noted  3. HTN: stable   Signed, Laverda Page, NP  09/09/2017, 9:35 AM  Pager # [phone]   For questions or updates, please contact [facility] Please consult [email or web address] for contact info under Cardiology/STEMI.

## 2017-09-10 MED ORDER — ACETAMINOPHEN 500 MG PO TABS
1000.0000 mg | ORAL_TABLET | Freq: Three times a day (TID) | ORAL | 0 refills | Status: DC
Start: 2017-09-10 — End: 2017-11-18

## 2017-09-10 MED ORDER — ISOSORBIDE MONONITRATE ER 60 MG PO TB24: 60.0000 mg | ORAL_TABLET | Freq: Every day | ORAL | Status: DC

## 2017-09-10 MED ORDER — NITROFURANTOIN MONOHYD MACRO 100 MG PO CAPS: 100.0000 mg | ORAL_CAPSULE | Freq: Two times a day (BID) | ORAL | Status: AC

## 2017-09-10 MED ORDER — METOPROLOL TARTRATE 25 MG PO TABS: 25.0000 mg | ORAL_TABLET | Freq: Four times a day (QID) | ORAL | Status: DC

## 2017-09-10 MED ORDER — METHOCARBAMOL 500 MG PO TABS: 500.0000 mg | ORAL_TABLET | Freq: Three times a day (TID) | ORAL | Status: DC

## 2017-09-10 NOTE — Clinical Social Work Placement (Signed)
   CLINICAL SOCIAL WORK PLACEMENT  NOTE  Date:  09/10/2017  Patient Details  Name: [name] MRN: [id number] Date of Birth: 1928-06-12  Clinical Social Work is seeking post-discharge placement for this patient at the Skilled  Nursing Facility level of care (*CSW will initial, date and re-position this form in  chart as items are completed):  Yes   Patient/family provided with [facility] Social Work Department's list of facilities offering this level of care within the geographic area requested by the patient (or if unable, by the patient's family).  Yes   Patient/family informed of their freedom to choose among providers that offer the needed level of care, that participate in Medicare, Medicaid or managed care program needed by the patient, have an available bed and are willing to accept the patient.  Yes   Patient/family informed of [facility]'s ownership interest in [facility] and [facility], as well as of the fact that they are under no obligation to receive care at these facilities.  PASRR submitted to EDS on 09/05/17     PASRR number received on 09/05/17     Existing PASRR number confirmed on       FL2 transmitted to all facilities in geographic area requested by pt/family on 09/05/17     FL2 transmitted to all facilities within larger geographic area on       Patient informed that his/her managed care company has contracts with or will negotiate with certain facilities, including the following:        Yes   Patient/family informed of bed offers received.  [facility]     Physician recommends and patient chooses bed at      [facility] on 09/10/17.  Patient to be transferred to facility by Ambulance     Patient family notified on 09/10/17 of transfer.  Name of family member notified:  Patient Niece at bedside     PHYSICIAN Please sign FL2, Please prepare  priority discharge summary, including medications     Additional Comment:   Macario GoldsJesse Isaish Alemu, LCSW [phone]

## 2017-09-10 NOTE — Progress Notes (Signed)
Attempted to call report to [facility], RN left message to return call.

## 2017-09-10 NOTE — Clinical Social Work Note (Signed)
Clinical Social Worker facilitated patient discharge including contacting patient family and facility to confirm patient discharge plans.  Clinical information faxed to facility and family agreeable with plan.  CSW arranged ambulance transport via [facility] to [facility] at 15:00 per facility request.  RN to call report prior to discharge [phone] [facility]).  Clinical Social Worker will sign off for now as social work intervention is no longer needed. Please consult us again if new need arises.  Macario GoldsJesse Jashley Yellin, [place] [phone]

## 2017-09-10 NOTE — Care Management Note (Signed)
Case Management Note  Patient Details  Name: [name] MRN: [id number] Date of Birth: [birth year]  Subjective/Objective:  84 yo admitted after fall at home with left rib fx.  PTA, pt independent with assistive device; lives alone.                   Action/Plan: PT recommending SNF for rehab; CSW consulted to facilitate dc to SNF upon medical stability.    Expected Discharge Date:  09/10/17               Expected Discharge Plan:  Skilled Nursing Facility  In-House Referral:  Clinical Social Work  Discharge planning Services  CM Consult  Post Acute Care Choice:    Choice offered to:     DME Arranged:    DME Agency:     HH Arranged:    HH Agency:     Status of Service:  Completed, signed off  If discussed at [employer] Meetings, dates discussed:    Additional Comments:  09/10/17 J. Coda Filler, RN, BSN Pt medically stable for dc today.  Plan dc to SNF per CSW arrangements.    Quintella BatonJulie W. Annamae Shivley, RN, BSN  Trauma/Neuro ICU Case Manager [phone]

## 2017-09-28 ENCOUNTER — Other Ambulatory Visit: Payer: Self-pay | Admitting: Cardiovascular Disease

## 2017-10-26 ENCOUNTER — Emergency Department (HOSPITAL_COMMUNITY): Payer: Medicare Other

## 2017-10-26 ENCOUNTER — Other Ambulatory Visit: Payer: Self-pay

## 2017-10-26 ENCOUNTER — Emergency Department (HOSPITAL_COMMUNITY)
Admission: EM | Admit: 2017-10-26 | Discharge: 2017-10-26 | Disposition: A | Discharge status: Home / Self Care | Payer: Medicare Other | Attending: Emergency Medicine | Admitting: Emergency Medicine

## 2017-10-26 ENCOUNTER — Encounter (HOSPITAL_COMMUNITY): Payer: Self-pay | Admitting: Emergency Medicine

## 2017-10-26 DIAGNOSIS — I110 Hypertensive heart disease with heart failure: Secondary | ICD-10-CM | POA: Diagnosis not present

## 2017-10-26 DIAGNOSIS — I2510 Atherosclerotic heart disease of native coronary artery without angina pectoris: Secondary | ICD-10-CM | POA: Diagnosis not present

## 2017-10-26 DIAGNOSIS — E785 Hyperlipidemia, unspecified: Secondary | ICD-10-CM | POA: Insufficient documentation

## 2017-10-26 DIAGNOSIS — I5032 Chronic diastolic (congestive) heart failure: Secondary | ICD-10-CM | POA: Diagnosis not present

## 2017-10-26 DIAGNOSIS — R079 Chest pain, unspecified: Secondary | ICD-10-CM | POA: Diagnosis present

## 2017-10-26 DIAGNOSIS — Z7982 Long term (current) use of aspirin: Secondary | ICD-10-CM | POA: Insufficient documentation

## 2017-10-26 DIAGNOSIS — Z79899 Other long term (current) drug therapy: Secondary | ICD-10-CM | POA: Diagnosis not present

## 2017-10-26 DIAGNOSIS — Z8673 Personal history of transient ischemic attack (TIA), and cerebral infarction without residual deficits: Secondary | ICD-10-CM | POA: Insufficient documentation

## 2017-10-26 LAB — CBC
HCT: 32.5 % — ABNORMAL LOW (ref 36.0–46.0)
Hemoglobin: 10.9 g/dL — ABNORMAL LOW (ref 12.0–15.0)
MCH: 31.4 pg (ref 26.0–34.0)
MCHC: 33.5 g/dL (ref 30.0–36.0)
MCV: 93.7 fL (ref 78.0–100.0)
Platelets: 197 10*3/uL (ref 150–400)
RBC: 3.47 MIL/uL — ABNORMAL LOW (ref 3.87–5.11)
RDW: 12.8 % (ref 11.5–15.5)
WBC: 6.6 10*3/uL (ref 4.0–10.5)

## 2017-10-26 LAB — BASIC METABOLIC PANEL
Anion gap: 6 (ref 5–15)
BUN: 9 mg/dL (ref 6–20)
CO2: 27 mmol/L (ref 22–32)
Calcium: 8.7 mg/dL — ABNORMAL LOW (ref 8.9–10.3)
Chloride: 107 mmol/L (ref 101–111)
Creatinine, Ser: 0.82 mg/dL (ref 0.44–1.00)
GFR calc Af Amer: >60 mL/min (ref >60)
GFR calc non Af Amer: >60 mL/min (ref >60)
Glucose, Bld: 113 mg/dL — ABNORMAL HIGH (ref 65–99)
Potassium: 3.9 mmol/L (ref 3.5–5.1)
Sodium: 140 mmol/L (ref 135–145)

## 2017-10-26 LAB — I-STAT TROPONIN, ED
Troponin i, poc: 0 ng/mL (ref 0.00–0.08)
Troponin i, poc: 0.01 ng/mL (ref 0.00–0.08)
Troponin i, poc: 0 ng/mL (ref 0.00–0.08)

## 2017-10-26 MED ORDER — NITROGLYCERIN 0.4 MG SL SUBL: 0.4000 mg | SUBLINGUAL_TABLET | SUBLINGUAL | 0 refills | Status: DC | PRN

## 2017-10-26 NOTE — Discharge Instructions (Addendum)
Try the nitroglycerin pills under your tongue if you get chest pain again and if the pain doesn't go away, call 911. Call Dr Harvie BridgeNahser's office on Monday, June 3, to let him know you had chest pain and came to the ED. He may want to see you sooner than your appointment. Return to the ED if you feel worse.

## 2017-10-26 NOTE — ED Provider Notes (Signed)
[facility] EMERGENCY DEPARTMENT Provider Note   CSN: [id number] Arrival date & time: 10/26/17  0112  Time seen 2:10 AM   History   Chief Complaint Chief Complaint  Patient presents with  . Chest Pain    HPI [name] is a 84 y.o. female.  HPI patient reports she had gone to bed about 9 PM.  At 10 PM she felt lightheaded and short of breath.  She states she had chest pain which she describes as a "strange feeling" and "frightening".  She states it was a tightness.  It did not radiate.  She states she felt sweaty and had nausea without vomiting.  She states she is never had this before.  EMS gave her 4 baby aspirin and a nitroglycerin and her discomfort is gone.  She also states that currently she feels strange because "I have broken ribs".  Patient broke her ribs about a month ago.  A lot of questions I asked her she states "I think so but I am not sure".  She states she still has some nausea.  Her family member states they were outside in the heat all week this week shopping and doing activities.  There was a bad storm tonight and patient spent the evening with her neighbor.  Patient gets occasional chest pain which the family member states his indigestion which patient denies.  Patient has had bypass surgery and has no stents.  PCP Donita Brooks, MD Cardiology Dr Elease Hashimoto  Past Medical History:  Diagnosis Date  . Allergy   . Asthma   . CAD (coronary artery disease)    s/p CABG x 5 in 1997 & CABG x 3 in 2010; Last cath in April of 2010 showed grafts to be patent. EF is 40 to 45%  . CHF (congestive heart failure) (HCC)    EF is 40 to 45%  . Depression    following bypass surgery  . Diverticulosis   . Endometriosis   . GERD (gastroesophageal reflux disease)   . Hepatitis A   . Hiatal hernia   . History of cerebrovascular disease   . History of vertebral fracture    at T8  . Hyperlipemia   . Hypertension   . LBP (low back pain)   . Mixed incontinence  urge and stress 04/22/2013  . Nocturia 04/22/2013  . Pleural effusion    post surgical  . PVD (peripheral vascular disease) (HCC)    S/P right CEA  . Sleep apnea   . Stroke [facility]) 2010    Patient Active Problem List   Diagnosis Date Noted  . Tachycardia   . PAF (paroxysmal atrial fibrillation) (HCC)   . Malnutrition of moderate degree 09/06/2017  . Multiple rib fractures 09/04/2017  . Dyspnea 01/21/2014  . Chronic diastolic CHF (congestive heart failure) (HCC) 12/15/2013  . Cerebral vascular disease 10/13/2013  . Mixed incontinence urge and stress 04/22/2013  . Nocturia 04/22/2013  . Angina decubitus (HCC) 03/12/2013  . GERD (gastroesophageal reflux disease) 08/16/2011  . Other general symptoms  08/16/2011  . Fatigue 09/04/2010  . HEPATITIS, ACUTE 07/12/2009  . AMI 07/12/2009  . ESOPHAGEAL REFLUX 07/12/2009  . DIVERTICULOSIS, COLON 07/12/2009  . CHEST PAIN 07/12/2009  . SLEEP APNEA 10/23/2007  . Essential hypertension 09/18/2007  . CAD (coronary artery disease) 09/18/2007  . CHEST PAIN, ATYPICAL 09/18/2007    Past Surgical History:  Procedure Laterality Date  . APPENDECTOMY    . CARDIAC CATHETERIZATION  April 2010  Grafts were patent. EF is 40 to 45%  . CAROTID ENDARTERECTOMY     right  . CORONARY ARTERY BYPASS GRAFT  1610,9604   Original surgery in 1997 x 5; Redo CABG x 3 in 2010 including free RIMA to distal LAD, SVG to OM and SVG to left posterolateral  . KNEE SURGERY     left  . LEFT HEART CATHETERIZATION WITH CORONARY/GRAFT ANGIOGRAM N/A 03/12/2013   Procedure: LEFT HEART CATHETERIZATION WITH Isabel Caprice;  Surgeon: Peter M Swaziland, MD;  Location: [facility] CATH LAB;  Service: Cardiovascular;  Laterality: N/A;  . PARTIAL COLECTOMY     due to colon abcess     OB History   None      Home Medications    Prior to Admission medications   Medication Sig Start Date End Date Taking? Authorizing Provider  aspirin EC 81 MG tablet Take 1 tablet (81 mg  total) by mouth daily. 12/15/13  Yes Nahser, Deloris Ping, MD  esomeprazole (NEXIUM) 20 MG capsule Take 20 mg by mouth daily at 12 noon.    Yes [provider]  isosorbide mononitrate (IMDUR) 60 MG 24 hr tablet Take 1 tablet (60 mg total) by mouth daily. 09/10/17  Yes Meuth, Brooke A, PA-C  metoprolol tartrate (LOPRESSOR) 25 MG tablet Take 1 tablet (25 mg total) by mouth every 6 (six) hours. Patient taking differently: Take 25 mg by mouth 2 (two) times daily.  09/10/17  Yes Meuth, Brooke A, PA-C  potassium chloride (MICRO-K) 10 MEQ CR capsule Take 1 capsule (10 mEq total) by mouth daily. Please call [phone] to schedule appointment for additional refills thanks. 08/12/17  Yes Nahser, Deloris Ping, MD  rosuvastatin (CRESTOR) 20 MG tablet TAKE 1 TABLET BY MOUTH EVERY DAY Patient taking differently: TAKE 20 mg  TABLET BY MOUTH EVERY DAY 02/14/17  Yes Nahser, Deloris Ping, MD  acetaminophen (TYLENOL) 500 MG tablet Take 2 tablets (1,000 mg total) by mouth 3 (three) times daily. Patient not taking: Reported on 10/26/2017 09/10/17   Carlena Bjornstad A, PA-C  isosorbide mononitrate (IMDUR) 60 MG 24 hr tablet Take 1 tablet (60 mg total) by mouth daily. Please make yearly appt with Dr. Elease Hashimoto for May before anymore refills. 1st attempt Patient not taking: Reported on 10/26/2017 08/15/17   Nahser, Deloris Ping, MD  methocarbamol (ROBAXIN) 500 MG tablet Take 1 tablet (500 mg total) by mouth 3 (three) times daily. Patient not taking: Reported on 10/26/2017 09/10/17   Carlena Bjornstad A, PA-C  nitroGLYCERIN (NITROSTAT) 0.4 MG SL tablet Place 1 tablet (0.4 mg total) under the tongue every 5 (five) minutes as needed for chest pain. 10/26/17   Devoria Albe, MD    Family History Family History  Problem Relation Age of Onset  . Heart attack Mother   . Diabetes Mother   . Heart disease Mother   . Rheum arthritis Mother   . Heart attack Father   . Allergies Father   . Diabetes Sister   . Stroke Sister   . Heart disease Sister   .  Dementia Brother   . Healthy Sister   . Other Brother        LUNG ISSUES  . Other Brother        LUNG ISSUES  . Other Brother        LUNG ISSUES  . Diabetes Maternal Grandmother   . Diabetes Paternal Grandmother   . Colon cancer Neg Hx     Social History Social History   Tobacco Use  .  Smoking status: Never Smoker  . Smokeless tobacco: Never Used  Substance Use Topics  . Alcohol use: No    Alcohol/week: 0.0 oz  . Drug use: No  Lives at home widowed   Allergies   Metoprolol; Phenergan [promethazine hcl]; Prochlorperazine edisylate; Zetia [ezetimibe]; Compazine [prochlorperazine]; Sulfur; Niacin; and Sulfonamide derivatives   Review of Systems Review of Systems  All other systems reviewed and are negative.    Physical Exam Updated Vital Signs BP 132/61   Pulse 75   Temp 98.7 F (37.1 C) (Oral)   Resp 18   Ht 5\' 5"  (1.651 m)   Wt 59 kg (130 lb)   SpO2 96%   BMI 21.63 kg/m   Vital signs normal    Physical Exam  Constitutional: She is oriented to person, place, and time.  Non-toxic appearance. She does not appear ill. No distress.  Frail elderly female no distress  HENT:  Head: Normocephalic and atraumatic.  Right Ear: External ear normal.  Left Ear: External ear normal.  Nose: Nose normal. No mucosal edema or rhinorrhea.  Mouth/Throat: Oropharynx is clear and moist and mucous membranes are normal. No dental abscesses or uvula swelling.  Eyes: Pupils are equal, round, and reactive to light. Conjunctivae and EOM are normal.  Neck: Normal range of motion and full passive range of motion without pain. Neck supple.  Cardiovascular: Normal rate, regular rhythm and normal heart sounds. Exam reveals no gallop and no friction rub.  No murmur heard. Pulmonary/Chest: Effort normal and breath sounds normal. No respiratory distress. She has no wheezes. She has no rhonchi. She has no rales. She exhibits no tenderness and no crepitus.  Abdominal: Soft. Normal  appearance and bowel sounds are normal. She exhibits no distension. There is no tenderness. There is no rebound and no guarding.  Musculoskeletal: Normal range of motion. She exhibits no edema or tenderness.  Moves all extremities well.   Neurological: She is alert and oriented to person, place, and time. She has normal strength. No cranial nerve deficit.  Skin: Skin is warm, dry and intact. No rash noted. No erythema. No pallor.  Psychiatric: She has a normal mood and affect. Her speech is normal and behavior is normal. Her mood appears not anxious.  Nursing note and vitals reviewed.    ED Treatments / Results  Labs (all labs ordered are listed, but only abnormal results are displayed) Results for orders placed or performed during the hospital encounter of 10/26/17  Basic metabolic panel  Result Value Ref Range   Sodium 140 135 - 145 mmol/L   Potassium 3.9 3.5 - 5.1 mmol/L   Chloride 107 101 - 111 mmol/L   CO2 27 22 - 32 mmol/L   Glucose, Bld 113 (H) 65 - 99 mg/dL   BUN 9 6 - 20 mg/dL   Creatinine, Ser 1.61 0.44 - 1.00 mg/dL   Calcium 8.7 (L) 8.9 - 10.3 mg/dL   GFR calc non Af Amer >60 >60 mL/min   GFR calc Af Amer >60 >60 mL/min   Anion gap 6 5 - 15  CBC  Result Value Ref Range   WBC 6.6 4.0 - 10.5 K/uL   RBC 3.47 (L) 3.87 - 5.11 MIL/uL   Hemoglobin 10.9 (L) 12.0 - 15.0 g/dL   HCT 09.6 (L) 04.5 - 40.9 %   MCV 93.7 78.0 - 100.0 fL   MCH 31.4 26.0 - 34.0 pg   MCHC 33.5 30.0 - 36.0 g/dL   RDW 81.1 91.4 - 78.2 %  Platelets 197 150 - 400 K/uL  I-stat troponin, ED  Result Value Ref Range   Troponin i, poc 0.00 0.00 - 0.08 ng/mL   Comment 3          I-stat troponin, ED  Result Value Ref Range   Troponin i, poc 0.01 0.00 - 0.08 ng/mL   Comment 3          I-stat troponin, ED  Result Value Ref Range   Troponin i, poc 0.00 0.00 - 0.08 ng/mL   Comment 3           Laboratory interpretation all normal except mild anemia    EKG EKG Interpretation  Date/Time:  Saturday  October 26 2017 01:20:22 EDT Ventricular Rate:  74 PR Interval:    QRS Duration: 137 QT Interval:  445 QTC Calculation: 494 R Axis:   -69 Text Interpretation:  Sinus rhythm Left bundle branch block Since last tracing 08 Sep 2017 sinus rhythm has replaced atrial fibrillation with rapid ventricular response Confirmed by Devoria Albe ([id number]) on 10/26/2017 2:02:32 AM   Radiology Dg Chest 2 View  Result Date: 10/26/2017 CLINICAL DATA:  Acute chest pain and shortness of breath. EXAM: CHEST - 2 VIEW COMPARISON:  09/09/2017 and prior exams FINDINGS: UPPER limits normal heart size and CABG changes again noted. There is no evidence of focal airspace disease, pulmonary edema, suspicious pulmonary nodule/mass, pleural effusion, or pneumothorax. No acute bony abnormalities are identified. Remote LEFT rib fractures again noted. Midthoracic vertebral augmentation changes again noted. IMPRESSION: No evidence of acute abnormality. Electronically Signed   By: Harmon Pier M.D.   On: 10/26/2017 02:07    Procedures Procedures (including critical care time)  Dr Harvie Bridge note Oct 19, 2016 1. Coronary artery disease: She status post CABG in 1997 and again in 2010. Heart catheterization in April 2010 and revealed severe native coronary artery disease. The LAD is occluded after giving off 2 large septal branches. The left circumflex artery has moderate irregularities and has competitive flow from the saphenous vein graft to the acute marginal artery. The right coronary artery is occluded. The SVG to the first obtuse marginal has minor luminal irregularities. The free RIMA originates off the hood of the SVG. It is nice graft and anastomosis to the distal LAD is normal. The SVG to right coronary artery is smooth and normal. The posterior descending artery and the posterior lateral segment artery are normal. Her ejection fraction is 40-45%. 2. Congestive heart failure-EF of 55-60%  3. Hyperlipidemia 4. Peripheral vascular disease  status post right carotid endarterectomy    Medications Ordered in ED Medications - No data to display   Initial Impression / Assessment and Plan / ED Course  I have reviewed the triage vital signs and the nursing notes.  Pertinent labs & imaging results that were available during my care of the patient were reviewed by me and considered in my medical decision making (see chart for details).    We discussed her initial laboratory test results.  A delta troponin was ordered.  The second troponin was 5-1/2 hours after she had her initial symptoms start.  Her chest x-ray does not show any signs of failure, or EKG does not show any acute changes, her troponins are negative.  Patient was last seen by Dr. Elease Hashimoto on Oct 19, 2016. Family states she has an appt in about 2 weeks  04:49 AM Dr Allena Katz, cardiology, recommends getting another troponin around 6:30 am and if normal discharge home with SL  NTG as long as she doesn't have any more chest pain.    7:15 AM patient's third troponin is negative.  She remains chest pain-free.  She feels ready to be discharged.  We discussed following up with Dr. Harvie BridgeNahser's office on Monday to let them know she came to the ED.  Final Clinical Impressions(s) / ED Diagnoses   Final diagnoses:  Chest pain, unspecified type    ED Discharge Orders        Ordered    nitroGLYCERIN (NITROSTAT) 0.4 MG SL tablet  Every 5 min PRN     10/26/17 0728      Plan discharge  Devoria AlbeIva Chaylee Ehrsam, MD, Concha PyoFACEP    Enrique Weiss, MD 10/26/17 [phone]

## 2017-10-26 NOTE — ED Notes (Signed)
Patient transported to X-ray 

## 2017-10-26 NOTE — ED Triage Notes (Signed)
Per EMS, pt from home. Pt has hx of heart attacks and bypass's. Pt reports lying in bed when she started having some 4/10 chest tightness with nausea, dizziness and sob. EMS gave 324 asa and 1 nitro which relieved tightness to 1/10.

## 2017-10-26 NOTE — ED Notes (Signed)
ED Provider at bedside. 

## 2017-11-05 ENCOUNTER — Other Ambulatory Visit: Payer: Self-pay | Admitting: Cardiovascular Disease

## 2017-11-18 ENCOUNTER — Encounter: Payer: Self-pay | Admitting: Cardiovascular Disease

## 2017-11-18 ENCOUNTER — Ambulatory Visit: Payer: Medicare Other | Admitting: Cardiovascular Disease

## 2017-11-18 VITALS — BP 114/46 | HR 73 | Ht 65.0 in | Wt 131.0 lb

## 2017-11-18 DIAGNOSIS — I5032 Chronic diastolic (congestive) heart failure: Secondary | ICD-10-CM

## 2017-11-18 DIAGNOSIS — I25810 Atherosclerosis of coronary artery bypass graft(s) without angina pectoris: Secondary | ICD-10-CM | POA: Diagnosis not present

## 2017-11-18 NOTE — Progress Notes (Signed)
Cardiology Office Note   Date:  11/18/2017   ID:  [name], DOB 01-28-1929, MRN [id number]  PCP:  Donita Brooks, MD  Cardiologist:   Kristeen Miss, MD   Chief Complaint  Patient presents with  . Coronary Artery Disease   1. Coronary artery disease: She status post CABG in 1997 and again in 2010. Heart catheterization in April 2010 and revealed severe native coronary artery disease. The LAD is occluded after giving off 2 large septal branches. The left circumflex artery has moderate irregularities and has competitive flow from the saphenous vein graft to the acute marginal artery. The right coronary artery is occluded. The SVG to the first obtuse marginal has minor luminal irregularities. The free RIMA originates off the hood of the SVG. It is nice graft and anastomosis to the distal LAD is normal. The SVG to right coronary artery is smooth and normal. The posterior descending artery and the posterior lateral segment artery are normal. Her ejection fraction is 40-45%. 2. Congestive heart failure-EF of 55-60%  3. Hyperlipidemia 4. Peripheral vascular disease status post right carotid endarterectomy   Previous notes  [name] is an 84 y.o. female with the above noted history. She's not had any episodes of chest pain. She does note lots of fatigue and as a result she has not been exercising much on a treadmill. She gets out of breath community slight activity.  She's not had any syncope or presyncope.  June 09, 2012 She is doing well. She is just getting over a bad cold. She is not exercising much this winter. She was walking about 45 minutes a day prior to the winter months. She has had some muscular shoulder pain but no angina.   Sept. 14, 2015:  [name] is doing well. No problems. No angina. She is enrolled in [employer] but doesn't go often. She sits quite a bit during the day. She has some dyspena - especially with exertion.   Nov. 10, 2014  She is doing well.  She walking and exercising . No significant CP. She had an abnormal myoview and subsequent cath revealed an occluded SVG to OM.   Feb. 9, 2016:  [name] is a 84 y.o. female who presents for follow up of her CAD She is having more shortness of breath - especially at night.  She has not been doing much exercise  Her stress myoview in July was low risk .  Is having lots of back pain this afternoon.   January 05, 2015:  [name] is seen back today for follow up for her CAD. Her husband , Jonny Ruiz died recently of complications from pancreatic cancer .   He had a Hotel manager burial at the [employer] in [place] .  No cardiac complications . Larey Seat several months ago and still has some soreness on her left side of her chest .  Ive suggested that she get a cane.   Nov. 2, 2017  [name] is seen back after 15 month absence.  Still living at home.  Her husband , Jonny Ruiz,  died last year She is scared living there.   Does not have any close neighbors. Doing well from a cardiac standpoint Limited by back pain.     Oct 19, 2016:  [name] is doing well. Has some shortness of breath  Still in her house  Complains that her memory is terrible .  Complains of swelling in her feet - eats out quite a lot.  Drinks Boost daily  November 18, 2017:   [name] is seen back today  Larey SeatFell in may and broke several ribs. Still getting along slowly  Has some shortness of breath - unchanged   Past Medical History:  Diagnosis Date  . Allergy   . Asthma   . CAD (coronary artery disease)    s/p CABG x 5 in 1997 & CABG x 3 in 2010; Last cath in April of 2010 showed grafts to be patent. EF is 40 to 45%  . CHF (congestive heart failure) (HCC)    EF is 40 to 45%  . Depression    following bypass surgery  . Diverticulosis   . Endometriosis   . GERD (gastroesophageal reflux disease)   . Hepatitis A   . Hiatal hernia   . History of cerebrovascular disease   . History of vertebral fracture    at T8  . Hyperlipemia   .  Hypertension   . LBP (low back pain)   . Mixed incontinence urge and stress 04/22/2013  . Nocturia 04/22/2013  . Pleural effusion    post surgical  . PVD (peripheral vascular disease) (HCC)    S/P right CEA  . Sleep apnea   . Stroke [facility](H[facility]) 2010    Past Surgical History:  Procedure Laterality Date  . APPENDECTOMY    . CARDIAC CATHETERIZATION  April 2010   Grafts were patent. EF is 40 to 45%  . CAROTID ENDARTERECTOMY     right  . CORONARY ARTERY BYPASS GRAFT  [id number]   Original surgery in 1997 x 5; Redo CABG x 3 in 2010 including free RIMA to distal LAD, SVG to OM and SVG to left posterolateral  . KNEE SURGERY     left  . LEFT HEART CATHETERIZATION WITH CORONARY/GRAFT ANGIOGRAM N/A 03/12/2013   Procedure: LEFT HEART CATHETERIZATION WITH Isabel CapriceORONARY/GRAFT ANGIOGRAM;  Surgeon: Peter M SwazilandJordan, MD;  Location: [facility] C[facility] LAB;  Service: Cardiovascular;  Laterality: N/A;  . PARTIAL COLECTOMY     due to colon abcess     Current Outpatient Medications  Medication Sig Dispense Refill  . aspirin EC 81 MG tablet Take 1 tablet (81 mg total) by mouth daily. 90 tablet 3  . esomeprazole (NEXIUM) 20 MG capsule Take 20 mg by mouth daily at 12 noon.     . isosorbide mononitrate (IMDUR) 60 MG 24 hr tablet Take 60 mg by mouth daily.    . nitroGLYCERIN (NITROSTAT) 0.4 MG SL tablet Place 1 tablet (0.4 mg total) under the tongue every 5 (five) minutes as needed for chest pain. 30 tablet 0  . potassium chloride (MICRO-K) 10 MEQ CR capsule Take 10 mEq by mouth daily.    . rosuvastatin (CRESTOR) 20 MG tablet TAKE 1 TABLET BY MOUTH EVERY DAY (Patient taking differently: TAKE 20 mg  TABLET BY MOUTH EVERY DAY) 30 tablet 7   No current facility-administered medications for this visit.     Allergies:   Metoprolol; Phenergan [promethazine hcl]; Prochlorperazine edisylate; Zetia [ezetimibe]; Compazine [prochlorperazine]; Sulfur; Niacin; and Sulfonamide derivatives    Social History:  The patient  reports that  she has never smoked. She has never used smokeless tobacco. She reports that she does not drink alcohol or use drugs.   Family History:  The patient's family history includes Allergies in her father; Dementia in her brother; Diabetes in her maternal grandmother, mother, paternal grandmother, and sister; Healthy in her sister; Heart attack in her father and mother; Heart disease in her mother and sister; Other in her  brother, brother, and brother; Rheum arthritis in her mother; Stroke in her sister.    ROS: Noted in current history, otherwise review of systems is negative.   Physical Exam: Blood pressure (!) 114/46, pulse 73, height 5\' 5"  (1.651 m), weight 131 lb (59.4 kg), SpO2 94 %.  GEN:  Well nourished, well developed in no acute distress HEENT: Normal NECK: No JVD; No carotid bruits LYMPHATICS: No lymphadenopathy CARDIAC: RRR   RESPIRATORY:  Clear to auscultation without rales, wheezing or rhonchi  ABDOMEN: Soft, non-tender, non-distended MUSCULOSKELETAL:  No edema; No deformity  SKIN: Warm and dry NEUROLOGIC:  Alert and oriented x 3    EKG:     Recent Labs: 09/03/2017: ALT 14 10/26/2017: BUN 9; Creatinine, Ser 0.82; Hemoglobin 10.9; Platelets 197; Potassium 3.9; Sodium 140    Lipid Panel    Component Value Date/Time   CHOL 135 10/19/2016 1448   TRIG 63 10/19/2016 1448   HDL 54 10/19/2016 1448   CHOLHDL 2.5 10/19/2016 1448   CHOLHDL 3 03/25/2014 1039   VLDL 19.8 03/25/2014 1039   LDLCALC 68 10/19/2016 1448      Wt Readings from Last 3 Encounters:  11/18/17 131 lb (59.4 kg)  10/26/17 130 lb (59 kg)  09/05/17 119 lb (54 kg)      Other studies Reviewed: Additional studies/ records that were reviewed today include: . Review of the above records demonstrates:    ASSESSMENT AND PLAN:  1. Coronary artery disease:    She is not having any episodes of angina.  Continue current medications.  2. Congestive heart failure -she is chronically short of breath.  Lungs are  clear.   3. Hyperlipidemia -  Following .   Check labs at next visit   4. Peripheral vascular disease status post right carotid endarterectomy.  5. Hx of falls:  Walking with a cane   Current medicines are reviewed at length with the patient today.  The patient does not have concerns regarding medicines.  The following changes have been made:  no change   Disposition:   FU with me in 6 months     Signed, Kristeen Miss, MD  11/18/2017 11:33 AM    [facility] [address] [place], [place], [place]  [address] Phone: [phone]; Fax: [phone]

## 2017-11-18 NOTE — Patient Instructions (Signed)

## 2017-11-26 ENCOUNTER — Other Ambulatory Visit: Payer: Self-pay | Admitting: Cardiovascular Disease

## 2018-02-03 ENCOUNTER — Ambulatory Visit (INDEPENDENT_AMBULATORY_CARE_PROVIDER_SITE_OTHER): Payer: Medicare Other | Admitting: Family Medicine

## 2018-02-03 ENCOUNTER — Encounter: Payer: Self-pay | Admitting: Family Medicine

## 2018-02-03 VITALS — BP 130/70 | HR 82 | Temp 97.9°F | Resp 14 | Ht 65.5 in | Wt 134.0 lb

## 2018-02-03 DIAGNOSIS — R2681 Unsteadiness on feet: Secondary | ICD-10-CM

## 2018-02-03 DIAGNOSIS — Z23 Encounter for immunization: Secondary | ICD-10-CM

## 2018-02-03 NOTE — Addendum Note (Signed)
Addended by: Legrand Rams B on: 02/03/2018 02:50 PM   Modules accepted: Orders

## 2018-02-03 NOTE — Progress Notes (Signed)
Subjective:    Patient ID: [name], female    DOB: September 16, 1928, 84 y.o.   MRN: [id number]  HPI Patient presents today with unsteady gait.  She has not been seen in more than a year.  In the time since I last saw the patient, she suffered a fall in April and sustained 3 rib fractures.  After her hospitalization, she was kept for a short period of time in a rehab facility however she was discharged home.  She is a widow.  She has no children.  Her closest relative is a niece who visits her 2-3 times a week and takes her to buy groceries.  She feels very unsafe at home.  She does not have a fall alert bracelet.  She does have a cane and a walker.  Today in clinic, I ambulated around the clinic with the patient.  She has a slow shuffling gait.  She has a 4 pronged cane which she uses to maintain her balance.  She did not displaying any ataxia.  She is able to turn without falling or losing her balance.  She appears to have primarily weakness and deconditioning interfering with her gait in addition to advancing age.  She also has some mild swelling in her right ankle and some mild pain with flexion and extension. Past Medical History:  Diagnosis Date  . Allergy   . Asthma   . CAD (coronary artery disease)    s/p CABG x 5 in 1997 & CABG x 3 in 2010; Last cath in April of 2010 showed grafts to be patent. EF is 40 to 45%  . CHF (congestive heart failure) (HCC)    EF is 40 to 45%  . Depression    following bypass surgery  . Diverticulosis   . Endometriosis   . GERD (gastroesophageal reflux disease)   . Hepatitis A   . Hiatal hernia   . History of cerebrovascular disease   . History of vertebral fracture    at T8  . Hyperlipemia   . Hypertension   . LBP (low back pain)   . Mixed incontinence urge and stress 04/22/2013  . Nocturia 04/22/2013  . Pleural effusion    post surgical  . PVD (peripheral vascular disease) (HCC)    S/P right CEA  . Sleep apnea   . Stroke [facility]) 2010   Past  Surgical History:  Procedure Laterality Date  . APPENDECTOMY    . CARDIAC CATHETERIZATION  April 2010   Grafts were patent. EF is 40 to 45%  . CAROTID ENDARTERECTOMY     right  . CORONARY ARTERY BYPASS GRAFT  [id number]   Original surgery in 1997 x 5; Redo CABG x 3 in 2010 including free RIMA to distal LAD, SVG to OM and SVG to left posterolateral  . KNEE SURGERY     left  . LEFT HEART CATHETERIZATION WITH CORONARY/GRAFT ANGIOGRAM N/A 03/12/2013   Procedure: LEFT HEART CATHETERIZATION WITH Isabel Caprice;  Surgeon: Peter M Swaziland, MD;  Location: [facility] CATH LAB;  Service: Cardiovascular;  Laterality: N/A;  . PARTIAL COLECTOMY     due to colon abcess   Current Outpatient Medications on File Prior to Visit  Medication Sig Dispense Refill  . aspirin EC 81 MG tablet Take 1 tablet (81 mg total) by mouth daily. 90 tablet 3  . esomeprazole (NEXIUM) 20 MG capsule Take 20 mg by mouth daily at 12 noon.     . isosorbide mononitrate (IMDUR) 60 MG 24  hr tablet Take 60 mg by mouth daily.    . nebivolol (BYSTOLIC) 10 MG tablet Take 10 mg by mouth daily.    . nitroGLYCERIN (NITROSTAT) 0.4 MG SL tablet Place 1 tablet (0.4 mg total) under the tongue every 5 (five) minutes as needed for chest pain. 30 tablet 0  . potassium chloride (MICRO-K) 10 MEQ CR capsule Take 10 mEq by mouth daily.    . rosuvastatin (CRESTOR) 20 MG tablet Take 1 tablet (20 mg total) by mouth daily. 90 tablet 3   No current facility-administered medications on file prior to visit.    Allergies  Allergen Reactions  . Metoprolol Other (See Comments)    allopoecia  . Phenergan [Promethazine Hcl] Other (See Comments)    Facial numbness  . Prochlorperazine Edisylate Anaphylaxis  . Zetia [Ezetimibe] Nausea Only and Other (See Comments)    Abdominal pain  . Compazine [Prochlorperazine]     Facial  Paralysis       . Sulfur Other (See Comments)    paralyzes  face   . Niacin Rash  . Sulfonamide Derivatives Nausea And  Vomiting   Social History   Socioeconomic History  . Marital status: Widowed    Spouse name: Not on file  . Number of children: 0  . Years of education: Not on file  . Highest education level: Not on file  Occupational History  . Occupation: retired Magazine features editor: RETIRED  Social Needs  . Financial resource strain: Not on file  . Food insecurity:    Worry: Not on file    Inability: Not on file  . Transportation needs:    Medical: Not on file    Non-medical: Not on file  Tobacco Use  . Smoking status: Never Smoker  . Smokeless tobacco: Never Used  Substance and Sexual Activity  . Alcohol use: No    Alcohol/week: 0.0 standard drinks  . Drug use: No  . Sexual activity: Never  Lifestyle  . Physical activity:    Days per week: Not on file    Minutes per session: Not on file  . Stress: Not on file  Relationships  . Social connections:    Talks on phone: Not on file    Gets together: Not on file    Attends religious service: Not on file    Active member of club or organization: Not on file    Attends meetings of clubs or organizations: Not on file    Relationship status: Not on file  . Intimate partner violence:    Fear of current or ex partner: Not on file    Emotionally abused: Not on file    Physically abused: Not on file    Forced sexual activity: Not on file  Other Topics Concern  . Not on file  Social History Narrative  . Not on file      Review of Systems  All other systems reviewed and are negative.      Objective:   Physical Exam  Cardiovascular: Normal rate, regular rhythm and normal heart sounds.  Pulmonary/Chest: Effort normal and breath sounds normal. No respiratory distress. She has no wheezes. She has no rales.  Musculoskeletal:       Right ankle: She exhibits decreased range of motion and swelling. She exhibits no ecchymosis and no deformity. No lateral malleolus and no medial malleolus tenderness found.  Vitals reviewed.           Assessment & Plan:  Gait instability -  Plan: Ambulatory referral to Home Health  I believe her gait instability is due to a combination of weakness/deconditioning, advancing age, poor proprioception, etc.  I believe she would benefit from home health physical therapy to assist in gait training, and to improve stamina and conditioning.  Also believe she would benefit from occupational therapy coming to her home for safety evaluation.  She has no family members to help with installing bars around her toilet or in her bathtub to help prevent falls.  I have also given her information regarding a fall alert bracelet system that she can share with her niece to help order to improve her safety at home.  I believe the swelling in her right ankle likely secondary to arthritis.  The swelling and pain is minimal.  We will defer x-rays at the present time unless the pain worsens.  I also had a long discussion with her about long-term placement is likely she would not be safe to remain independent at home at some point in the near future.  I have recommended discussing with her family possible assisted living facility placement.

## 2018-02-04 ENCOUNTER — Other Ambulatory Visit: Payer: Self-pay | Admitting: Cardiovascular Disease

## 2018-07-22 ENCOUNTER — Other Ambulatory Visit: Payer: Self-pay | Admitting: Cardiovascular Disease

## 2018-08-06 ENCOUNTER — Other Ambulatory Visit: Payer: Self-pay | Admitting: Family Medicine

## 2018-08-06 ENCOUNTER — Other Ambulatory Visit: Payer: Self-pay | Admitting: Cardiovascular Disease

## 2018-09-10 ENCOUNTER — Emergency Department (HOSPITAL_COMMUNITY): Payer: Medicare Other

## 2018-09-10 ENCOUNTER — Observation Stay (HOSPITAL_COMMUNITY)
Admission: EM | Admit: 2018-09-10 | Discharge: 2018-09-11 | Disposition: A | Discharge status: Skilled Nursing Facility | Payer: Medicare Other | Attending: Family Medicine | Admitting: Family Medicine

## 2018-09-10 ENCOUNTER — Encounter (HOSPITAL_COMMUNITY): Payer: Self-pay | Admitting: Emergency Medicine

## 2018-09-10 DIAGNOSIS — Z888 Allergy status to other drugs, medicaments and biological substances status: Secondary | ICD-10-CM | POA: Insufficient documentation

## 2018-09-10 DIAGNOSIS — R319 Hematuria, unspecified: Secondary | ICD-10-CM

## 2018-09-10 DIAGNOSIS — Z951 Presence of aortocoronary bypass graft: Secondary | ICD-10-CM | POA: Insufficient documentation

## 2018-09-10 DIAGNOSIS — W0110XA Fall on same level from slipping, tripping and stumbling with subsequent striking against unspecified object, initial encounter: Secondary | ICD-10-CM | POA: Diagnosis not present

## 2018-09-10 DIAGNOSIS — R197 Diarrhea, unspecified: Secondary | ICD-10-CM | POA: Diagnosis not present

## 2018-09-10 DIAGNOSIS — N390 Urinary tract infection, site not specified: Secondary | ICD-10-CM | POA: Diagnosis present

## 2018-09-10 DIAGNOSIS — Z8249 Family history of ischemic heart disease and other diseases of the circulatory system: Secondary | ICD-10-CM | POA: Diagnosis not present

## 2018-09-10 DIAGNOSIS — J45909 Unspecified asthma, uncomplicated: Secondary | ICD-10-CM | POA: Insufficient documentation

## 2018-09-10 DIAGNOSIS — I2510 Atherosclerotic heart disease of native coronary artery without angina pectoris: Secondary | ICD-10-CM | POA: Insufficient documentation

## 2018-09-10 DIAGNOSIS — Z9049 Acquired absence of other specified parts of digestive tract: Secondary | ICD-10-CM | POA: Diagnosis not present

## 2018-09-10 DIAGNOSIS — G4730 Sleep apnea, unspecified: Secondary | ICD-10-CM | POA: Diagnosis not present

## 2018-09-10 DIAGNOSIS — R531 Weakness: Secondary | ICD-10-CM

## 2018-09-10 DIAGNOSIS — I739 Peripheral vascular disease, unspecified: Secondary | ICD-10-CM | POA: Insufficient documentation

## 2018-09-10 DIAGNOSIS — Z8673 Personal history of transient ischemic attack (TIA), and cerebral infarction without residual deficits: Secondary | ICD-10-CM | POA: Diagnosis not present

## 2018-09-10 DIAGNOSIS — Z79899 Other long term (current) drug therapy: Secondary | ICD-10-CM | POA: Insufficient documentation

## 2018-09-10 DIAGNOSIS — K219 Gastro-esophageal reflux disease without esophagitis: Secondary | ICD-10-CM | POA: Insufficient documentation

## 2018-09-10 DIAGNOSIS — I5032 Chronic diastolic (congestive) heart failure: Secondary | ICD-10-CM | POA: Diagnosis not present

## 2018-09-10 DIAGNOSIS — Z66 Do not resuscitate: Secondary | ICD-10-CM | POA: Insufficient documentation

## 2018-09-10 DIAGNOSIS — Z7982 Long term (current) use of aspirin: Secondary | ICD-10-CM | POA: Insufficient documentation

## 2018-09-10 DIAGNOSIS — W19XXXA Unspecified fall, initial encounter: Secondary | ICD-10-CM

## 2018-09-10 DIAGNOSIS — R2689 Other abnormalities of gait and mobility: Secondary | ICD-10-CM | POA: Insufficient documentation

## 2018-09-10 DIAGNOSIS — Y92009 Unspecified place in unspecified non-institutional (private) residence as the place of occurrence of the external cause: Secondary | ICD-10-CM

## 2018-09-10 DIAGNOSIS — F329 Major depressive disorder, single episode, unspecified: Secondary | ICD-10-CM | POA: Diagnosis not present

## 2018-09-10 DIAGNOSIS — Z9884 Bariatric surgery status: Secondary | ICD-10-CM | POA: Diagnosis not present

## 2018-09-10 DIAGNOSIS — S022XXA Fracture of nasal bones, initial encounter for closed fracture: Secondary | ICD-10-CM | POA: Diagnosis present

## 2018-09-10 DIAGNOSIS — E785 Hyperlipidemia, unspecified: Secondary | ICD-10-CM | POA: Diagnosis not present

## 2018-09-10 DIAGNOSIS — I110 Hypertensive heart disease with heart failure: Secondary | ICD-10-CM | POA: Insufficient documentation

## 2018-09-10 DIAGNOSIS — Z882 Allergy status to sulfonamides status: Secondary | ICD-10-CM | POA: Insufficient documentation

## 2018-09-10 LAB — CBC WITH DIFFERENTIAL/PLATELET
Abs Immature Granulocytes: 0.02 10*3/uL (ref 0.00–0.07)
Basophils Absolute: 0.1 10*3/uL (ref 0.0–0.1)
Basophils Relative: 1 %
Eosinophils Absolute: 0.1 10*3/uL (ref 0.0–0.5)
Eosinophils Relative: 1 %
HCT: 36.9 % (ref 36.0–46.0)
Hemoglobin: 12.1 g/dL (ref 12.0–15.0)
Immature Granulocytes: 0 %
Lymphocytes Relative: 25 %
Lymphs Abs: 2.3 10*3/uL (ref 0.7–4.0)
MCH: 30.7 pg (ref 26.0–34.0)
MCHC: 32.8 g/dL (ref 30.0–36.0)
MCV: 93.7 fL (ref 80.0–100.0)
Monocytes Absolute: 0.7 10*3/uL (ref 0.1–1.0)
Monocytes Relative: 8 %
Neutro Abs: 6.1 10*3/uL (ref 1.7–7.7)
Neutrophils Relative %: 65 %
Platelets: 204 10*3/uL (ref 150–400)
RBC: 3.94 MIL/uL (ref 3.87–5.11)
RDW: 12.4 % (ref 11.5–15.5)
WBC: 9.2 10*3/uL (ref 4.0–10.5)
nRBC: 0 % (ref 0.0–0.2)

## 2018-09-10 LAB — COMPREHENSIVE METABOLIC PANEL
ALT: 14 U/L (ref 0–44)
AST: 23 U/L (ref 15–41)
Albumin: 3.6 g/dL (ref 3.5–5.0)
Alkaline Phosphatase: 56 U/L (ref 38–126)
Anion gap: 10 (ref 5–15)
BUN: 16 mg/dL (ref 8–23)
CO2: 25 mmol/L (ref 22–32)
Calcium: 9.5 mg/dL (ref 8.9–10.3)
Chloride: 104 mmol/L (ref 98–111)
Creatinine, Ser: 0.94 mg/dL (ref 0.44–1.00)
GFR calc Af Amer: >60 mL/min (ref >60)
GFR calc non Af Amer: 55 mL/min — ABNORMAL LOW (ref >60)
Glucose, Bld: 116 mg/dL — ABNORMAL HIGH (ref 70–99)
Potassium: 4.2 mmol/L (ref 3.5–5.1)
Sodium: 139 mmol/L (ref 135–145)
Total Bilirubin: 0.5 mg/dL (ref 0.3–1.2)
Total Protein: 6.4 g/dL — ABNORMAL LOW (ref 6.5–8.1)

## 2018-09-10 LAB — URINALYSIS, ROUTINE W REFLEX MICROSCOPIC
Bilirubin Urine: NEGATIVE
Glucose, UA: NEGATIVE mg/dL
Ketones, ur: NEGATIVE mg/dL
Nitrite: NEGATIVE
Protein, ur: NEGATIVE mg/dL
Specific Gravity, Urine: 1.01 (ref 1.005–1.030)
pH: 6 (ref 5.0–8.0)

## 2018-09-10 MED ORDER — ACETAMINOPHEN 325 MG PO TABS
650.0000 mg | ORAL_TABLET | Freq: Once | ORAL | Status: AC
  Administered 2018-09-10: 650 mg via ORAL
  Filled 2018-09-10: qty 2

## 2018-09-10 MED ORDER — SODIUM CHLORIDE 0.9 % IV BOLUS
500.0000 mL | Freq: Once | INTRAVENOUS | Status: AC
  Administered 2018-09-10: 500 mL via INTRAVENOUS

## 2018-09-10 MED ORDER — IOHEXOL 300 MG/ML  SOLN
100.0000 mL | Freq: Once | INTRAMUSCULAR | Status: AC | PRN
  Administered 2018-09-10: 21:00:00 100 mL via INTRAVENOUS

## 2018-09-10 MED ORDER — SODIUM CHLORIDE 0.9 % IV SOLN
1.0000 g | Freq: Once | INTRAVENOUS | Status: AC
  Administered 2018-09-11: 1 g via INTRAVENOUS
  Filled 2018-09-10: qty 10

## 2018-09-10 NOTE — ED Provider Notes (Signed)
[facility] EMERGENCY DEPARTMENT Provider Note   CSN: [id number] Arrival date & time: 09/10/18  1839    History   Chief Complaint Chief Complaint  Patient presents with   Fall   Near Syncope    HPI [name] is a 85 y.o. female presenitng for evaluation after a fall.   Pt states he was on her way to the bathroom when she tripped over her cane, falling face first.  She denies loss of consciousness.  She is not on blood thinners.  She landed on the left side of her face.  She reports acute onset left facial pain and bleeding from her nose.  Patient was unable to get herself up for an hour.  Patient also reports multiple episodes of diarrhea today, states she has had 3-4 bowel movements.  She denies melena or hematochezia.  She denies recent fevers, chills, cough, chest pain, shortness of breath, nausea, vomiting, abdominal pain.  Patient states she has had urinary frequency for the past month.  She denies dysuria or hematuria. Distal history obtained from chart review.  Patient with a history of CAD, CHF, HLD, GERD, CVA, hypertension, PVD.       HPI  Past Medical History:  Diagnosis Date   Allergy    Asthma    CAD (coronary artery disease)    s/p CABG x 5 in 1997 & CABG x 3 in 2010; Last cath in April of 2010 showed grafts to be patent. EF is 40 to 45%   CHF (congestive heart failure) (HCC)    EF is 40 to 45%   Depression    following bypass surgery   Diverticulosis    Endometriosis    GERD (gastroesophageal reflux disease)    Hepatitis A    Hiatal hernia    History of cerebrovascular disease    History of vertebral fracture    at T8   Hyperlipemia    Hypertension    LBP (low back pain)    Mixed incontinence urge and stress 04/22/2013   Nocturia 04/22/2013   Pleural effusion    post surgical   PVD (peripheral vascular disease) (HCC)    S/P right CEA   Sleep apnea    Stroke [facility]) 2010    Patient Active Problem List   Diagnosis Date Noted   UTI (urinary tract infection) 09/10/2018   Tachycardia    PAF (paroxysmal atrial fibrillation) (HCC)    Malnutrition of moderate degree 09/06/2017   Multiple rib fractures 09/04/2017   Dyspnea 01/21/2014   Chronic diastolic CHF (congestive heart failure) (HCC) 12/15/2013   Cerebral vascular disease 10/13/2013   Mixed incontinence urge and stress 04/22/2013   Nocturia 04/22/2013   Angina decubitus (HCC) 03/12/2013   GERD (gastroesophageal reflux disease) 08/16/2011   Other general symptoms  08/16/2011   Fatigue 09/04/2010   HEPATITIS, ACUTE 07/12/2009   AMI 07/12/2009   ESOPHAGEAL REFLUX 07/12/2009   DIVERTICULOSIS, COLON 07/12/2009   CHEST PAIN 07/12/2009   SLEEP APNEA 10/23/2007   Essential hypertension 09/18/2007   CAD (coronary artery disease) 09/18/2007   CHEST PAIN, ATYPICAL 09/18/2007    Past Surgical History:  Procedure Laterality Date   APPENDECTOMY     CARDIAC CATHETERIZATION  April 2010   Grafts were patent. EF is 40 to 45%   CAROTID ENDARTERECTOMY     right   CORONARY ARTERY BYPASS GRAFT  [id number]   Original surgery in 1997 x 5; Redo CABG x 3 in 2010 including free RIMA to  distal LAD, SVG to OM and SVG to left posterolateral   KNEE SURGERY     left   LEFT HEART CATHETERIZATION WITH CORONARY/GRAFT ANGIOGRAM N/A 03/12/2013   Procedure: LEFT HEART CATHETERIZATION WITH Isabel Caprice;  Surgeon: Peter M Swaziland, MD;  Location: [facility] CATH LAB;  Service: Cardiovascular;  Laterality: N/A;   PARTIAL COLECTOMY     due to colon abcess     OB History   No obstetric history on file.      Home Medications    Prior to Admission medications   Medication Sig Start Date End Date Taking? Authorizing Provider  aspirin EC 81 MG tablet Take 1 tablet (81 mg total) by mouth daily. 12/15/13  Yes Nahser, Deloris Ping, MD  BYSTOLIC 10 MG tablet TAKE 1 TABLET BY MOUTH EVERY DAY Patient taking differently: Take 10 mg by mouth  daily.  08/06/18  Yes Donita Brooks, MD  esomeprazole (NEXIUM) 20 MG capsule Take 20 mg by mouth daily at 12 noon.    Yes [provider]  isosorbide mononitrate (IMDUR) 60 MG 24 hr tablet Take 1 tablet (60 mg total) by mouth daily. 02/04/18  Yes Nahser, Deloris Ping, MD  nitroGLYCERIN (NITROSTAT) 0.4 MG SL tablet Place 1 tablet (0.4 mg total) under the tongue every 5 (five) minutes as needed for chest pain. 10/26/17  Yes Devoria Albe, MD  potassium chloride (MICRO-K) 10 MEQ CR capsule Take 1 capsule (10 mEq total) by mouth daily. Please make yearly appt with Dr. Elease Hashimoto for June for future refills. 1st attempt 07/22/18  Yes Nahser, Deloris Ping, MD  rosuvastatin (CRESTOR) 20 MG tablet Take 1 tablet (20 mg total) by mouth daily. 11/26/17  Yes Nahser, Deloris Ping, MD    Family History Family History  Problem Relation Age of Onset   Heart attack Mother    Diabetes Mother    Heart disease Mother    Rheum arthritis Mother    Heart attack Father    Allergies Father    Diabetes Sister    Stroke Sister    Heart disease Sister    Dementia Brother    Healthy Sister    Other Brother        LUNG ISSUES   Other Brother        LUNG ISSUES   Other Brother        LUNG ISSUES   Diabetes Maternal Grandmother    Diabetes Paternal Grandmother    Colon cancer Neg Hx     Social History Social History   Tobacco Use   Smoking status: Never Smoker   Smokeless tobacco: Never Used  Substance Use Topics   Alcohol use: No    Alcohol/week: 0.0 standard drinks   Drug use: No     Allergies   Metoprolol; Phenergan [promethazine hcl]; Prochlorperazine edisylate; Zetia [ezetimibe]; Compazine [prochlorperazine]; Sulfur; Niacin; and Sulfonamide derivatives   Review of Systems Review of Systems  Gastrointestinal: Positive for diarrhea.  Genitourinary: Positive for frequency.  Musculoskeletal: Positive for arthralgias.  All other systems reviewed and are negative.    Physical  Exam Updated Vital Signs BP (!) 123/44    Pulse 68    Temp 97.7 F (36.5 C) (Rectal)    Resp 18    SpO2 97%   Physical Exam Vitals signs and nursing note reviewed.  Constitutional:      General: She is not in acute distress.    Appearance: She is well-developed.     Comments: Elderly female in no acute  distress  HENT:     Head: Normocephalic.     Comments: Contusion to left forehead.  Tenderness palpation of left zygoma.  Bruising of the nasal bridge.  Dried blood, but no active epistaxis.  No trismus or malocclusion. Eyes:     Conjunctiva/sclera: Conjunctivae normal.     Pupils: Pupils are equal, round, and reactive to light.     Comments: EOMI and PERRLA.  No nystagmus.  No entrapment.  Neck:     Comments: In c-collar Cardiovascular:     Rate and Rhythm: Normal rate and regular rhythm.     Pulses: Normal pulses.  Pulmonary:     Effort: Pulmonary effort is normal. No respiratory distress.     Breath sounds: Normal breath sounds. No wheezing.     Comments: Speaking in full sentences.  Clear lung sounds in all fields.  No tenderness palpation of the chest wall Abdominal:     General: There is no distension.     Palpations: Abdomen is soft. There is no mass.     Tenderness: There is abdominal tenderness. There is no guarding or rebound.     Comments: Generalized tenderness palpation the abdomen.  Soft without rigidity, guarding, distention.  Negative rebound.  Musculoskeletal:        General: Swelling and tenderness present.     Comments: Tenderness palpation of the pelvis bilaterally.  No obvious instability.   Full active range of motion of upper extremities without difficulty.  Contusion at the distal end of the fifth metacarpal. Contusion and abrasion of bilateral knees.  Full active range of motion.  Pedal pulses intact.  No tenderness palpation of upper or lower legs.  Skin:    General: Skin is warm and dry.     Capillary Refill: Capillary refill takes less than 2 seconds.   Neurological:     Mental Status: She is alert and oriented to person, place, and time.      ED Treatments / Results  Labs (all labs ordered are listed, but only abnormal results are displayed) Labs Reviewed  COMPREHENSIVE METABOLIC PANEL - Abnormal; Notable for the following components:      Result Value   Glucose, Bld 116 (*)    Total Protein 6.4 (*)    GFR calc non Af Amer 55 (*)    All other components within normal limits  URINALYSIS, ROUTINE W REFLEX MICROSCOPIC - Abnormal; Notable for the following components:   APPearance CLOUDY (*)    Hgb urine dipstick MODERATE (*)    Leukocytes,Ua SMALL (*)    Bacteria, UA MANY (*)    All other components within normal limits  URINE CULTURE  CBC WITH DIFFERENTIAL/PLATELET    EKG None  Radiology Ct Head Wo Contrast  Result Date: 09/10/2018 CLINICAL DATA:  Fall with head trauma EXAM: CT HEAD WITHOUT CONTRAST CT MAXILLOFACIAL WITHOUT CONTRAST CT CERVICAL SPINE WITHOUT CONTRAST TECHNIQUE: Multidetector CT imaging of the head, cervical spine, and maxillofacial structures were performed using the standard protocol without intravenous contrast. Multiplanar CT image reconstructions of the cervical spine and maxillofacial structures were also generated. COMPARISON:  CT head and cervical spine 09/03/2017 FINDINGS: CT HEAD FINDINGS Brain: There is no mass, hemorrhage or extra-axial collection. The size and configuration of the ventricles and extra-axial CSF spaces are normal. The brain parenchyma is normal, without evidence of acute or chronic infarction. Vascular: No abnormal hyperdensity of the major intracranial arteries or dural venous sinuses. No intracranial atherosclerosis. Skull: Small left frontal scalp hematoma.  No  skull fracture. CT MAXILLOFACIAL FINDINGS Osseous: --Complex facial fracture types: No LeFort, zygomaticomaxillary complex or nasoorbitoethmoidal fracture. --Simple fracture types: Rightward displacement of anterior bilateral  nasal bone fractures. --Mandible: No fracture or dislocation. Orbits: The globes are intact. Normal appearance of the intra- and extraconal fat. Symmetric extraocular muscles and optic nerves. Sinuses: No fluid levels or advanced mucosal thickening. Soft tissues: Normal visualized extracranial soft tissues. CT CERVICAL SPINE FINDINGS Alignment: No static subluxation. Facets are aligned. Occipital condyles and the lateral masses of C1-C2 are aligned. Skull base and vertebrae: No acute fracture. Soft tissues and spinal canal: No prevertebral fluid or swelling. No visible canal hematoma. Disc levels: No advanced spinal canal or neural foraminal stenosis. Upper chest: No pneumothorax, pulmonary nodule or pleural effusion. Other: Normal visualized paraspinal cervical soft tissues. IMPRESSION: 1. No acute intracranial abnormality. 2. No acute fracture or static subluxation of the cervical spine. 3. Small left frontal scalp hematoma without skull fracture. 4. Bilateral anterior nasal bone fractures with rightward displacement. No other facial fracture. Electronically Signed   By: Deatra Robinson M.D.   On: 09/10/2018 20:52   Ct Cervical Spine Wo Contrast  Result Date: 09/10/2018 CLINICAL DATA:  Fall with head trauma EXAM: CT HEAD WITHOUT CONTRAST CT MAXILLOFACIAL WITHOUT CONTRAST CT CERVICAL SPINE WITHOUT CONTRAST TECHNIQUE: Multidetector CT imaging of the head, cervical spine, and maxillofacial structures were performed using the standard protocol without intravenous contrast. Multiplanar CT image reconstructions of the cervical spine and maxillofacial structures were also generated. COMPARISON:  CT head and cervical spine 09/03/2017 FINDINGS: CT HEAD FINDINGS Brain: There is no mass, hemorrhage or extra-axial collection. The size and configuration of the ventricles and extra-axial CSF spaces are normal. The brain parenchyma is normal, without evidence of acute or chronic infarction. Vascular: No abnormal hyperdensity  of the major intracranial arteries or dural venous sinuses. No intracranial atherosclerosis. Skull: Small left frontal scalp hematoma.  No skull fracture. CT MAXILLOFACIAL FINDINGS Osseous: --Complex facial fracture types: No LeFort, zygomaticomaxillary complex or nasoorbitoethmoidal fracture. --Simple fracture types: Rightward displacement of anterior bilateral nasal bone fractures. --Mandible: No fracture or dislocation. Orbits: The globes are intact. Normal appearance of the intra- and extraconal fat. Symmetric extraocular muscles and optic nerves. Sinuses: No fluid levels or advanced mucosal thickening. Soft tissues: Normal visualized extracranial soft tissues. CT CERVICAL SPINE FINDINGS Alignment: No static subluxation. Facets are aligned. Occipital condyles and the lateral masses of C1-C2 are aligned. Skull base and vertebrae: No acute fracture. Soft tissues and spinal canal: No prevertebral fluid or swelling. No visible canal hematoma. Disc levels: No advanced spinal canal or neural foraminal stenosis. Upper chest: No pneumothorax, pulmonary nodule or pleural effusion. Other: Normal visualized paraspinal cervical soft tissues. IMPRESSION: 1. No acute intracranial abnormality. 2. No acute fracture or static subluxation of the cervical spine. 3. Small left frontal scalp hematoma without skull fracture. 4. Bilateral anterior nasal bone fractures with rightward displacement. No other facial fracture. Electronically Signed   By: Deatra Robinson M.D.   On: 09/10/2018 20:52   Ct Abdomen Pelvis W Contrast  Result Date: 09/10/2018 CLINICAL DATA:  85 year old female with diarrhea and a fall EXAM: CT ABDOMEN AND PELVIS WITH CONTRAST TECHNIQUE: Multidetector CT imaging of the abdomen and pelvis was performed using the standard protocol following bolus administration of intravenous contrast. CONTRAST:  OMNIPAQUE IOHEXOL 300 MG/ML  SOLN COMPARISON:  09/03/2017 FINDINGS: Lower chest: Scarring/atelectasis at the lung  bases, with no acute finding. Hepatobiliary: Unremarkable liver.  Unremarkable gallbladder. Pancreas: Unremarkable Spleen: Unremarkable Adrenals/Urinary  Tract: Unremarkable adrenal glands. Right kidney unremarkable with no hydronephrosis or nephrolithiasis. Unremarkable course the right ureter. Left kidney without hydronephrosis or nephrolithiasis. Bosniak 1 cyst on the lateral cortex. Unremarkable course the left ureter. Unremarkable urinary bladder. Stomach/Bowel: Hiatal hernia. Small bowel unremarkable, decompressed without acute inflammatory changes or wall thickening. No inflammatory changes of the mesentery. Appendix is not visualized, however, no inflammatory changes are present adjacent to the cecum to indicate an appendicitis. Extensive diverticular disease without focal inflammatory changes to suggest acute diverticulitis. No transition point. Vascular/Lymphatic: Advanced atherosclerotic changes of the abdominal aorta. Calcifications at the origin of the celiac artery and superior mesenteric artery. Branches remain patent. Calcifications at the origin the bilateral renal arteries. Bilateral iliac arteries and proximal femoral arteries patent. Reproductive: Unremarkable uterus Other: No inflammatory changes of the abdomen wall.  No hernia Musculoskeletal: No acute displaced fracture. Degenerative changes of the visualized thoracolumbar spine. No bony canal narrowing. IMPRESSION: No acute CT finding. Diverticular disease without evidence of acute diverticulitis. Advanced aortic atherosclerosis, including mesenteric arterial disease and renal artery disease. Aortic Atherosclerosis (ICD10-I70.0). Electronically Signed   By: Gilmer Mor D.O.   On: 09/10/2018 21:58   Dg Pelvis Portable  Result Date: 09/10/2018 CLINICAL DATA:  85 y/o  F; status post fall. EXAM: PORTABLE PELVIS 1-2 VIEWS COMPARISON:  09/03/2017 CT abdomen and pelvis. FINDINGS: There is no evidence of pelvic fracture or diastasis. No pelvic  bone lesions are seen. Bones are demineralized. Mild-to-moderate osteoarthrosis of the hip joints with loss of joint space and periarticular osteophytosis. IMPRESSION: Negative. Electronically Signed   By: Mitzi Hansen M.D.   On: 09/10/2018 19:47   Dg Chest Portable 1 View  Result Date: 09/10/2018 CLINICAL DATA:  85 y/o  F; status post fall. EXAM: PORTABLE CHEST 1 VIEW COMPARISON:  10/26/2017 chest radiograph FINDINGS: Stable cardiac silhouette within normal limits given projection and technique. Post median sternotomy and CABG. Sternotomy wires are aligned and intact. Aortic atherosclerosis with calcification. Clear lungs. No pleural effusion or pneumothorax. Chronic left posterior rib fractures. Stable midthoracic compression deformity post augmentation. No acute osseous abnormality identified. IMPRESSION: No acute process identified.  Stable chest radiograph. Electronically Signed   By: Mitzi Hansen M.D.   On: 09/10/2018 19:44   Dg Hand Complete Right  Result Date: 09/10/2018 CLINICAL DATA:  85 y/o  F; status post fall. EXAM: RIGHT HAND - COMPLETE 3+ VIEW COMPARISON:  None. FINDINGS: There is no evidence of fracture or dislocation. Osteoarthrosis interphalangeal joints with loss of the joint space and osteophytosis most pronounced in the digit 1-3 distal interphalangeal joints. Bones are demineralized. IMPRESSION: No acute fracture or dislocation identified. Electronically Signed   By: Mitzi Hansen M.D.   On: 09/10/2018 19:52   Ct Maxillofacial Wo Contrast  Result Date: 09/10/2018 CLINICAL DATA:  Fall with head trauma EXAM: CT HEAD WITHOUT CONTRAST CT MAXILLOFACIAL WITHOUT CONTRAST CT CERVICAL SPINE WITHOUT CONTRAST TECHNIQUE: Multidetector CT imaging of the head, cervical spine, and maxillofacial structures were performed using the standard protocol without intravenous contrast. Multiplanar CT image reconstructions of the cervical spine and maxillofacial structures  were also generated. COMPARISON:  CT head and cervical spine 09/03/2017 FINDINGS: CT HEAD FINDINGS Brain: There is no mass, hemorrhage or extra-axial collection. The size and configuration of the ventricles and extra-axial CSF spaces are normal. The brain parenchyma is normal, without evidence of acute or chronic infarction. Vascular: No abnormal hyperdensity of the major intracranial arteries or dural venous sinuses. No intracranial atherosclerosis. Skull: Small left frontal scalp  hematoma.  No skull fracture. CT MAXILLOFACIAL FINDINGS Osseous: --Complex facial fracture types: No LeFort, zygomaticomaxillary complex or nasoorbitoethmoidal fracture. --Simple fracture types: Rightward displacement of anterior bilateral nasal bone fractures. --Mandible: No fracture or dislocation. Orbits: The globes are intact. Normal appearance of the intra- and extraconal fat. Symmetric extraocular muscles and optic nerves. Sinuses: No fluid levels or advanced mucosal thickening. Soft tissues: Normal visualized extracranial soft tissues. CT CERVICAL SPINE FINDINGS Alignment: No static subluxation. Facets are aligned. Occipital condyles and the lateral masses of C1-C2 are aligned. Skull base and vertebrae: No acute fracture. Soft tissues and spinal canal: No prevertebral fluid or swelling. No visible canal hematoma. Disc levels: No advanced spinal canal or neural foraminal stenosis. Upper chest: No pneumothorax, pulmonary nodule or pleural effusion. Other: Normal visualized paraspinal cervical soft tissues. IMPRESSION: 1. No acute intracranial abnormality. 2. No acute fracture or static subluxation of the cervical spine. 3. Small left frontal scalp hematoma without skull fracture. 4. Bilateral anterior nasal bone fractures with rightward displacement. No other facial fracture. Electronically Signed   By: Deatra Robinson M.D.   On: 09/10/2018 20:52    Procedures Procedures (including critical care time)  Medications Ordered in  ED Medications  cefTRIAXone (ROCEPHIN) 1 g in sodium chloride 0.9 % 100 mL IVPB (has no administration in time range)  acetaminophen (TYLENOL) tablet 650 mg (650 mg Oral Given 09/10/18 1950)  iohexol (OMNIPAQUE) 300 MG/ML solution 100 mL (100 mLs Intravenous Contrast Given 09/10/18 2129)  sodium chloride 0.9 % bolus 500 mL (500 mLs Intravenous New Bag/Given 09/10/18 2319)     Initial Impression / Assessment and Plan / ED Course  I have reviewed the triage vital signs and the nursing notes.  Pertinent labs & imaging results that were available during my care of the patient were reviewed by me and considered in my medical decision making (see chart for details).        Patient presenting for evaluation after fall.  Physical exam reassuring, she appears nontoxic.  History concerning, patient reporting urinary frequency.  Also reporting diarrhea today.  Concern for dehydration versus mechanical fall versus infection.  Will obtain labs, imaging, and reassess.  Also, patient's abdominal pain numbness is concerning, will hold off on CT until I get x-rays back to see if needs CT of the chest as well.  X-rays viewed interpreted by me, no fracture, dislocation.  CT head, neck, maxillofacial shows a right nasal fracture.  Patient with a and naris and no obvious deformity.  I do not where she needs emergent surgery for this.  Will obtain CT abdomen pelvis for further evaluation.  CT abdomen pelvis negative for acute findings.  Labs reassuring, no leukocytosis.  Kidney and liver function reassuring.  Urine positive for UTI with many bacteria and small leuks and moderate blood.  Attempted orthostatics with patient, she was unable to stand up due to dizziness/weakness.  As she lives at home by herself, I do not like she can go home.  This is likely due to dehydration versus diarrhea versus UTI.  Will call for admission.  Case discussed with attending, Dr. Erma Heritage evaluated the patient. Rocephin ordered for  UTI.  Discussed with Dr. Arlean Hopping from [facility], pt to be admitted.   Final Clinical Impressions(s) / ED Diagnoses   Final diagnoses:  Urinary tract infection with hematuria, site unspecified  Closed fracture of nasal bone, initial encounter  Fall, initial encounter  Diarrhea, unspecified type    ED Discharge Orders    None  Alveria ApleyCaccavale, Elbert Spickler, PA-C 09/10/18 [id number]    Shaune PollackIsaacs, Cameron, MD 09/11/18 1446

## 2018-09-10 NOTE — ED Triage Notes (Signed)
Pt here from home with c/o diarrhea and a fall trying to get to the bathroom , pt has a hematoma to her head and c/o bil hip pain pt did have a temp of 100.8

## 2018-09-10 NOTE — ED Notes (Signed)
Pt became extremely dizzy with postural changes.

## 2018-09-10 NOTE — H&P (Signed)
History and Physical    PLEASE NOTE THAT [employer]AGON DICT[employer] SOFTWARE WAS USED IN THE CONSTRUCTION OF THIS NOTE.   [name]:[id number] DOB: January 29, 1929 DOA: 09/10/2018  PCP: Susy Frizzle, MD Patient coming from: Home  I have personally briefly reviewed patient's old medical records in [place]  Chief Complaint: Ground-level mechanical fall  HPI: [name] is a 85 y.o. female with medical history significant for coronary artery disease status post 5 vessel CABG in 1997 followed by three-vessel redo in 6720, chronic diastolic heart failure, hypertension, who is admitted to [facility] on 09/10/2018 with acute cystitis presenting from home to the [facility] emergency department complaining of a ground-level fall.   The patient reports that she was ambulating at home earlier today when she tripped over her cane prompting her to fall to the tile floor, striking the anterior portion of her face off of the surface.  She reports that this fall was purely mechanical in nature, and denied any associated loss of consciousness or any associated chest pain, shortness of breath, palpitations, or diaphoresis.  Subsequent to the fall, she reports pain across her nose but denies any associated neck, shoulder, elbow, hip, knee, or ankle discomfort.  The patient, who lives at home by herself, and is able to independently complete all of her ADLs at baseline, was ultimately able to get off of the floor after 1 hour and contacted EMS, who brought patient to [facility] emergency department for further evaluation of her ground-level mechanical fall.  The patient reports 1 week of increased urinary frequency in the absence of any associated dysuria or gross hematuria.  She denies any subjective fever, chills, rigors, or generalized myalgias.  Denies any rhinitis, rhinorrhea, cough, shortness of breath or any known or suspected COVID-19 exposures.  Over the last week leading up to tonight's  presentation, she also notes generalized weakness in the absence of any acute focal neurologic deficits.  She denies any overt abdominal discomfort nor any nausea or vomiting.     ED Course: Vital signs in the emergency department notable for the following: Temperature maximum 97.7, heart rate 71-80; blood pressure ranged from 123/44-140 8/62; respiratory rate 13-18, and oxygen saturation 9600% on room air.  Labs in the ED were notable for the following: CMP notable for sodium 139, bicarbonate 25, creatinine 0.94 relative to baseline creatinine ranges 0.8-0.9.  CBC notable for white blood cell count of 9200 with 65% neutrophils.  Urinalysis was associated with a cloudy appearing specimen demonstrating 6-10 white blood cells, many bacteria and was positive for leukocyte esterace.   Chest x-ray showed no evidence of acute cardiopulmonary process.  Plain films of the pelvis showed no evidence of acute fracture.  Plain films of the right hand showed no evidence of acute fracture or dislocation.  Noncontrast CT of the head demonstrated no evidence of acute intracranial process going clear no evidence of intracranial bleed.  CT of the cervical spine showed evidence of acute cervical spinal fracture.  CT maxillofacial showed bilateral anterior nasal bone fracture with right-ward placement.  While still emergency department, the following were administered: Tylenol 650 mg p.o. x1, IV normal saline has 100 cc bolus, and Rocephin 1 g IV x1.    Review of Systems: As per HPI otherwise 10 point review of systems negative.   Past Medical History:  Diagnosis Date   Allergy    Asthma    CAD (coronary artery disease)    s/p CABG  x 5 in 1997 & CABG x 3 in 2010; Last cath in April of 2010 showed grafts to be patent. EF is 40 to 45%   CHF (congestive heart failure) (HCC)    EF is 40 to 45%   Depression    following bypass surgery   Diverticulosis    Endometriosis    GERD (gastroesophageal reflux  disease)    Hepatitis A    Hiatal hernia    History of cerebrovascular disease    History of vertebral fracture    at T8   Hyperlipemia    Hypertension    LBP (low back pain)    Mixed incontinence urge and stress 04/22/2013   Nocturia 04/22/2013   Pleural effusion    post surgical   PVD (peripheral vascular disease) ([place])    S/P right CEA   Sleep apnea    Stroke [facility]) 2010    Past Surgical History:  Procedure Laterality Date   APPENDECTOMY     CARDIAC CATHETERIZATION  April 2010   Grafts were patent. EF is 40 to 45%   CAROTID ENDARTERECTOMY     right   CORONARY ARTERY BYPASS GRAFT  [id number]   Original surgery in 1997 x 5; Redo CABG x 3 in 2010 including free RIMA to distal LAD, SVG to OM and SVG to left posterolateral   KNEE SURGERY     left   LEFT HEART CATHETERIZATION WITH CORONARY/GRAFT ANGIOGRAM N/A 03/12/2013   Procedure: LEFT HEART CATHETERIZATION WITH Beatrix Fetters;  Surgeon: Peter M Martinique, MD;  Location: [facility] CATH LAB;  Service: Cardiovascular;  Laterality: N/A;   PARTIAL COLECTOMY     due to colon abcess    Social History:  reports that she has never smoked. She has never used smokeless tobacco. She reports that she does not drink alcohol or use drugs.   Allergies  Allergen Reactions   Metoprolol Other (See Comments)    allopoecia   Phenergan [Promethazine Hcl] Other (See Comments)    Facial numbness   Prochlorperazine Edisylate Anaphylaxis   Zetia [Ezetimibe] Nausea Only and Other (See Comments)    Abdominal pain   Compazine [Prochlorperazine]     Facial  Paralysis        Sulfur Other (See Comments)    paralyzes  face    Niacin Rash   Sulfonamide Derivatives Nausea And Vomiting    Family History  Problem Relation Age of Onset   Heart attack Mother    Diabetes Mother    Heart disease Mother    Rheum arthritis Mother    Heart attack Father    Allergies Father    Diabetes Sister    Stroke  Sister    Heart disease Sister    Dementia Brother    Healthy Sister    Other Brother        LUNG ISSUES   Other Brother        LUNG ISSUES   Other Brother        LUNG ISSUES   Diabetes Maternal Grandmother    Diabetes Paternal Grandmother    Colon cancer Neg Hx      Prior to Admission medications   Medication Sig Start Date End Date Taking? Authorizing Provider  aspirin EC 81 MG tablet Take 1 tablet (81 mg total) by mouth daily. 12/15/13  Yes Nahser, Wonda Cheng, MD  BYSTOLIC 10 MG tablet TAKE 1 TABLET BY MOUTH EVERY DAY Patient taking differently: Take 10 mg by mouth daily.  08/06/18  Yes Susy Frizzle, MD  esomeprazole (NEXIUM) 20 MG capsule Take 20 mg by mouth daily at 12 noon.    Yes [provider]  isosorbide mononitrate (IMDUR) 60 MG 24 hr tablet Take 1 tablet (60 mg total) by mouth daily. 02/04/18  Yes Nahser, Wonda Cheng, MD  nitroGLYCERIN (NITROSTAT) 0.4 MG SL tablet Place 1 tablet (0.4 mg total) under the tongue every 5 (five) minutes as needed for chest pain. 10/26/17  Yes Rolland Porter, MD  potassium chloride (MICRO-K) 10 MEQ CR capsule Take 1 capsule (10 mEq total) by mouth daily. Please make yearly appt with Dr. Acie Fredrickson for June for future refills. 1st attempt 07/22/18  Yes Nahser, Wonda Cheng, MD  rosuvastatin (CRESTOR) 20 MG tablet Take 1 tablet (20 mg total) by mouth daily. 11/26/17  Yes Nahser, Wonda Cheng, MD     Objective     Physical Exam: Vitals:   09/10/18 2000 09/10/18 2115 09/10/18 2200 09/10/18 2215  BP:  (!) 125/50 (!) 144/72 (!) 123/44  Pulse: 72 71 80 68  Resp: 18 17 10 18   Temp:      TempSrc:      SpO2: 96% 99% 100% 97%    General: appears to be stated age; alert, oriented Skin: warm, dry; multiple areas of contusion, including over left forehead, right fifth MCP joint, as well as abrasions over the bilateral knees Head: Contusion over the left forehead and bruising over the nasal bridge. Mouth:  Oral mucosa membranes appear dry, normal  dentition Neck: supple; trachea midline Heart:  RRR; did not appreciate any M/R/G Lungs: CTAB, did not appreciate any wheezes, rales, or rhonchi Abdomen: + BS; soft, ND, NT Extremities: no peripheral edema, no muscle wasting; abrasion over the anterior aspect of bilateral knees.    Labs on Admission: I have personally reviewed following labs and imaging studies  CBC: Recent Labs  Lab 09/10/18 1940  WBC 9.2  NEUTROABS 6.1  HGB 12.1  HCT 36.9  MCV 93.7  PLT 702   Basic Metabolic Panel: Recent Labs  Lab 09/10/18 1940  NA 139  K 4.2  CL 104  CO2 25  GLUCOSE 116*  BUN 16  CREATININE 0.94  CALCIUM 9.5   GFR: CrCl cannot be calculated (Unknown ideal weight.). Liver Function Tests: Recent Labs  Lab 09/10/18 1940  AST 23  ALT 14  ALKPHOS 56  BILITOT 0.5  PROT 6.4*  ALBUMIN 3.6   No results for input(s): LIPASE, AMYLASE in the last 168 hours. No results for input(s): AMMONIA in the last 168 hours. Coagulation Profile: No results for input(s): INR, PROTIME in the last 168 hours. Cardiac Enzymes: No results for input(s): CKTOTAL, CKMB, CKMBINDEX, TROPONINI in the last 168 hours. BNP (last 3 results) No results for input(s): PROBNP in the last 8760 hours. HbA1C: No results for input(s): HGBA1C in the last 72 hours. CBG: No results for input(s): GLUCAP in the last 168 hours. Lipid Profile: No results for input(s): CHOL, HDL, LDLCALC, TRIG, CHOLHDL, LDLDIRECT in the last 72 hours. Thyroid Function Tests: No results for input(s): TSH, T4TOTAL, FREET4, T3FREE, THYROIDAB in the last 72 hours. Anemia Panel: No results for input(s): VITAMINB12, FOLATE, FERRITIN, TIBC, IRON, RETICCTPCT in the last 72 hours. Urine analysis:    Component Value Date/Time   COLORURINE YELLOW 09/10/2018 1940   APPEARANCEUR CLOUDY (A) 09/10/2018 1940   LABSPEC 1.010 09/10/2018 1940   PHURINE 6.0 09/10/2018 1940   GLUCOSEU NEGATIVE 09/10/2018 1940   HGBUR MODERATE (A) 09/10/2018 1940  BILIRUBINUR NEGATIVE 09/10/2018 1940   KETONESUR NEGATIVE 09/10/2018 1940   PROTEINUR NEGATIVE 09/10/2018 1940   UROBILINOGEN 0.2 01/15/2014 2032   NITRITE NEGATIVE 09/10/2018 1940   LEUKOCYTESUR SMALL (A) 09/10/2018 1940    Radiological Exams on Admission: Ct Head Wo Contrast  Result Date: 09/10/2018 CLINICAL DATA:  Fall with head trauma EXAM: CT HEAD WITHOUT CONTRAST CT MAXILLOFACIAL WITHOUT CONTRAST CT CERVICAL SPINE WITHOUT CONTRAST TECHNIQUE: Multidetector CT imaging of the head, cervical spine, and maxillofacial structures were performed using the standard protocol without intravenous contrast. Multiplanar CT image reconstructions of the cervical spine and maxillofacial structures were also generated. COMPARISON:  CT head and cervical spine 09/03/2017 FINDINGS: CT HEAD FINDINGS Brain: There is no mass, hemorrhage or extra-axial collection. The size and configuration of the ventricles and extra-axial CSF spaces are normal. The brain parenchyma is normal, without evidence of acute or chronic infarction. Vascular: No abnormal hyperdensity of the major intracranial arteries or dural venous sinuses. No intracranial atherosclerosis. Skull: Small left frontal scalp hematoma.  No skull fracture. CT MAXILLOFACIAL FINDINGS Osseous: --Complex facial fracture types: No LeFort, zygomaticomaxillary complex or nasoorbitoethmoidal fracture. --Simple fracture types: Rightward displacement of anterior bilateral nasal bone fractures. --Mandible: No fracture or dislocation. Orbits: The globes are intact. Normal appearance of the intra- and extraconal fat. Symmetric extraocular muscles and optic nerves. Sinuses: No fluid levels or advanced mucosal thickening. Soft tissues: Normal visualized extracranial soft tissues. CT CERVICAL SPINE FINDINGS Alignment: No static subluxation. Facets are aligned. Occipital condyles and the lateral masses of C1-C2 are aligned. Skull base and vertebrae: No acute fracture. Soft tissues and  spinal canal: No prevertebral fluid or swelling. No visible canal hematoma. Disc levels: No advanced spinal canal or neural foraminal stenosis. Upper chest: No pneumothorax, pulmonary nodule or pleural effusion. Other: Normal visualized paraspinal cervical soft tissues. IMPRESSION: 1. No acute intracranial abnormality. 2. No acute fracture or static subluxation of the cervical spine. 3. Small left frontal scalp hematoma without skull fracture. 4. Bilateral anterior nasal bone fractures with rightward displacement. No other facial fracture. Electronically Signed   By: Ulyses Jarred M.D.   On: 09/10/2018 20:52   Ct Cervical Spine Wo Contrast  Result Date: 09/10/2018 CLINICAL DATA:  Fall with head trauma EXAM: CT HEAD WITHOUT CONTRAST CT MAXILLOFACIAL WITHOUT CONTRAST CT CERVICAL SPINE WITHOUT CONTRAST TECHNIQUE: Multidetector CT imaging of the head, cervical spine, and maxillofacial structures were performed using the standard protocol without intravenous contrast. Multiplanar CT image reconstructions of the cervical spine and maxillofacial structures were also generated. COMPARISON:  CT head and cervical spine 09/03/2017 FINDINGS: CT HEAD FINDINGS Brain: There is no mass, hemorrhage or extra-axial collection. The size and configuration of the ventricles and extra-axial CSF spaces are normal. The brain parenchyma is normal, without evidence of acute or chronic infarction. Vascular: No abnormal hyperdensity of the major intracranial arteries or dural venous sinuses. No intracranial atherosclerosis. Skull: Small left frontal scalp hematoma.  No skull fracture. CT MAXILLOFACIAL FINDINGS Osseous: --Complex facial fracture types: No LeFort, zygomaticomaxillary complex or nasoorbitoethmoidal fracture. --Simple fracture types: Rightward displacement of anterior bilateral nasal bone fractures. --Mandible: No fracture or dislocation. Orbits: The globes are intact. Normal appearance of the intra- and extraconal fat.  Symmetric extraocular muscles and optic nerves. Sinuses: No fluid levels or advanced mucosal thickening. Soft tissues: Normal visualized extracranial soft tissues. CT CERVICAL SPINE FINDINGS Alignment: No static subluxation. Facets are aligned. Occipital condyles and the lateral masses of C1-C2 are aligned. Skull base and vertebrae: No acute fracture. Soft tissues  and spinal canal: No prevertebral fluid or swelling. No visible canal hematoma. Disc levels: No advanced spinal canal or neural foraminal stenosis. Upper chest: No pneumothorax, pulmonary nodule or pleural effusion. Other: Normal visualized paraspinal cervical soft tissues. IMPRESSION: 1. No acute intracranial abnormality. 2. No acute fracture or static subluxation of the cervical spine. 3. Small left frontal scalp hematoma without skull fracture. 4. Bilateral anterior nasal bone fractures with rightward displacement. No other facial fracture. Electronically Signed   By: Ulyses Jarred M.D.   On: 09/10/2018 20:52   Ct Abdomen Pelvis W Contrast  Result Date: 09/10/2018 CLINICAL DATA:  85 year old female with diarrhea and a fall EXAM: CT ABDOMEN AND PELVIS WITH CONTRAST TECHNIQUE: Multidetector CT imaging of the abdomen and pelvis was performed using the standard protocol following bolus administration of intravenous contrast. CONTRAST:  147m OMNIPAQUE IOHEXOL 300 MG/ML  SOLN COMPARISON:  09/03/2017 FINDINGS: Lower chest: Scarring/atelectasis at the lung bases, with no acute finding. Hepatobiliary: Unremarkable liver.  Unremarkable gallbladder. Pancreas: Unremarkable Spleen: Unremarkable Adrenals/Urinary Tract: Unremarkable adrenal glands. Right kidney unremarkable with no hydronephrosis or nephrolithiasis. Unremarkable course the right ureter. Left kidney without hydronephrosis or nephrolithiasis. Bosniak 1 cyst on the lateral cortex. Unremarkable course the left ureter. Unremarkable urinary bladder. Stomach/Bowel: Hiatal hernia. Small bowel  unremarkable, decompressed without acute inflammatory changes or wall thickening. No inflammatory changes of the mesentery. Appendix is not visualized, however, no inflammatory changes are present adjacent to the cecum to indicate an appendicitis. Extensive diverticular disease without focal inflammatory changes to suggest acute diverticulitis. No transition point. Vascular/Lymphatic: Advanced atherosclerotic changes of the abdominal aorta. Calcifications at the origin of the celiac artery and superior mesenteric artery. Branches remain patent. Calcifications at the origin the bilateral renal arteries. Bilateral iliac arteries and proximal femoral arteries patent. Reproductive: Unremarkable uterus Other: No inflammatory changes of the abdomen wall.  No hernia Musculoskeletal: No acute displaced fracture. Degenerative changes of the visualized thoracolumbar spine. No bony canal narrowing. IMPRESSION: No acute CT finding. Diverticular disease without evidence of acute diverticulitis. Advanced aortic atherosclerosis, including mesenteric arterial disease and renal artery disease. Aortic Atherosclerosis (ICD10-I70.0). Electronically Signed   By: JCorrie MckusickD.O.   On: 09/10/2018 21:58   Dg Pelvis Portable  Result Date: 09/10/2018 CLINICAL DATA:  85y/o  F; status post fall. EXAM: PORTABLE PELVIS 1-2 VIEWS COMPARISON:  09/03/2017 CT abdomen and pelvis. FINDINGS: There is no evidence of pelvic fracture or diastasis. No pelvic bone lesions are seen. Bones are demineralized. Mild-to-moderate osteoarthrosis of the hip joints with loss of joint space and periarticular osteophytosis. IMPRESSION: Negative. Electronically Signed   By: LKristine GarbeM.D.   On: 09/10/2018 19:47   Dg Chest Portable 1 View  Result Date: 09/10/2018 CLINICAL DATA:  85y/o  F; status post fall. EXAM: PORTABLE CHEST 1 VIEW COMPARISON:  10/26/2017 chest radiograph FINDINGS: Stable cardiac silhouette within normal limits given  projection and technique. Post median sternotomy and CABG. Sternotomy wires are aligned and intact. Aortic atherosclerosis with calcification. Clear lungs. No pleural effusion or pneumothorax. Chronic left posterior rib fractures. Stable midthoracic compression deformity post augmentation. No acute osseous abnormality identified. IMPRESSION: No acute process identified.  Stable chest radiograph. Electronically Signed   By: LKristine GarbeM.D.   On: 09/10/2018 19:44   Dg Hand Complete Right  Result Date: 09/10/2018 CLINICAL DATA:  85y/o  F; status post fall. EXAM: RIGHT HAND - COMPLETE 3+ VIEW COMPARISON:  None. FINDINGS: There is no evidence of fracture or dislocation. Osteoarthrosis interphalangeal  joints with loss of the joint space and osteophytosis most pronounced in the digit 1-3 distal interphalangeal joints. Bones are demineralized. IMPRESSION: No acute fracture or dislocation identified. Electronically Signed   By: Kristine Garbe M.D.   On: 09/10/2018 19:52   Ct Maxillofacial Wo Contrast  Result Date: 09/10/2018 CLINICAL DATA:  Fall with head trauma EXAM: CT HEAD WITHOUT CONTRAST CT MAXILLOFACIAL WITHOUT CONTRAST CT CERVICAL SPINE WITHOUT CONTRAST TECHNIQUE: Multidetector CT imaging of the head, cervical spine, and maxillofacial structures were performed using the standard protocol without intravenous contrast. Multiplanar CT image reconstructions of the cervical spine and maxillofacial structures were also generated. COMPARISON:  CT head and cervical spine 09/03/2017 FINDINGS: CT HEAD FINDINGS Brain: There is no mass, hemorrhage or extra-axial collection. The size and configuration of the ventricles and extra-axial CSF spaces are normal. The brain parenchyma is normal, without evidence of acute or chronic infarction. Vascular: No abnormal hyperdensity of the major intracranial arteries or dural venous sinuses. No intracranial atherosclerosis. Skull: Small left frontal scalp  hematoma.  No skull fracture. CT MAXILLOFACIAL FINDINGS Osseous: --Complex facial fracture types: No LeFort, zygomaticomaxillary complex or nasoorbitoethmoidal fracture. --Simple fracture types: Rightward displacement of anterior bilateral nasal bone fractures. --Mandible: No fracture or dislocation. Orbits: The globes are intact. Normal appearance of the intra- and extraconal fat. Symmetric extraocular muscles and optic nerves. Sinuses: No fluid levels or advanced mucosal thickening. Soft tissues: Normal visualized extracranial soft tissues. CT CERVICAL SPINE FINDINGS Alignment: No static subluxation. Facets are aligned. Occipital condyles and the lateral masses of C1-C2 are aligned. Skull base and vertebrae: No acute fracture. Soft tissues and spinal canal: No prevertebral fluid or swelling. No visible canal hematoma. Disc levels: No advanced spinal canal or neural foraminal stenosis. Upper chest: No pneumothorax, pulmonary nodule or pleural effusion. Other: Normal visualized paraspinal cervical soft tissues. IMPRESSION: 1. No acute intracranial abnormality. 2. No acute fracture or static subluxation of the cervical spine. 3. Small left frontal scalp hematoma without skull fracture. 4. Bilateral anterior nasal bone fractures with rightward displacement. No other facial fracture. Electronically Signed   By: Ulyses Jarred M.D.   On: 09/10/2018 20:52     Assessment/Plan   [name] is a 85 y.o. female with medical history significant for coronary artery disease status post 5 vessel CABG in 1997 followed by three-vessel redo in 6387, chronic diastolic heart failure, hypertension, who is admitted to [facility] on 09/10/2018 with acute cystitis and generalized weakness after presenting from home to the [facility] emergency department complaining of a ground-level fall.    Principal Problem:   UTI (urinary tract infection) Active Problems:   Chronic diastolic CHF (congestive heart failure)  ([place])   Fall at home, initial encounter   Generalized weakness    #) Urinary tract infection: Diagnosis on the basis of patient's report of 1 week of increased urinary frequency, with urinalysis demonstrating many bacteria and positive leukocyte esterace.  Sirs criteria are not met for patient be considered septic.  Suspect that acute cystitis contributed to the patient's report of recent generalized weakness resulting in her ground-level mechanical fall earlier today.  While still in the ED today, the patient received a dose of Rocephin.  Plan: Continue IV Rocephin.  Check blood cultures x2.  Will monitor closely for results of urine culture collected today.  Repeat CBC with differential in the morning.    #) Ground-level mechanical fall: Not associated with any loss of consciousness, however the patient did hit her head,  and shows CT confirmation of bilateral anterior nasal bone fracture.   Plan: PRN Tylenol.  I have placed a physical therapy consult for the morning given that it appears that generalized weakness contributed to the patient's susceptibility for fall, particularly context that she lives independently at baseline.  Will check CPK level given that the patient reports that she was down on the floor for approximately 1 hour before she gathered enough strength in order to get herself off the flooring called EMS.     #) Generalized weakness: The patient reports 4 to 5 days of generalized weakness in the absence of any acute focal neurologic deficits.  Appears to be in the basis of acute cystitis, as described above.  Plan: Work-up and management of presenting acute cystitis as above.  Physical therapy consult has been placed for the morning.    #) Bilateral anterior nasal bone fracture: CT maxillofacial demonstrates bilateral anterior nasal bone fracture with right-ward displacement.  Patient currently reports adequate pain control.  No indication for urgent overnight surgical  consultation.  Plan: PRN Tylenol.     #) Chronic diastolic heart failure: Most recent echocardiogram occurred in February 2017 demonstrated LVEF 55 to 60%, no evidence of focal wall motion abnormalities, grade 1 diastolic dysfunction, and moderate tricuspid regurgitation.  No evidence of acutely compensated heart failure at this time.  Plan: Monitor strict I's and O's as well as daily weights.  Repeat BMP in the morning.    #) Essential Hypertension: Outpatient antihypertensive regimen consists of Nebivolol, and imdur.  Systolic blood pressures since presenting to the emergency department noted to be in the 120s to 140s mmHg. as target blood pressure for this 85 year old patient without any history of chronic kidney disease or diabetes would be to maintain blood pressure less than 150/90, it is possible that her blood pressure is being too tightly controlled as an outpatient, and that this may have contributed to her susceptibility for presenting ground-level mechanical fall.  Plan: We will resume home antihypertensive regimen, but closely monitor ensuing blood pressure via routine vital signs.    DVT prophylaxis: scd's Code Status: DNR: Affirmed per my discussions with the patient this evening. Family Communication: None Disposition Plan:  Per Rounding Team Consults called: None Admission status: Observation; MedSurg    PLEASE NOTE THAT DRAGON DICTATION SOFTWARE WAS USED IN THE CONSTRUCTION OF THIS NOTE.   Rhetta Mura DO [facility] Pager [phone] From [place].     09/10/2018, 11:37 PM

## 2018-09-10 NOTE — ED Notes (Signed)
Patient transported to MRI 

## 2018-09-10 NOTE — ED Notes (Signed)
ED TO INPATIENT HANDOFF REPORT  ED Nurse Name and Phone #:  Alan Ripper [phone] S Name/Age/Gender [name] 85 y.o. female Room/Bed: 031C/031C  Code Status   Code Status: Prior  Home/SNF/Other Home Patient oriented to: self, place, time and situation Is this baseline? Yes   Triage Complete: Triage complete  Chief Complaint Fall; Multiple Injuries  Triage Note Pt here from home with c/o diarrhea and a fall trying to get to the bathroom , pt has a hematoma to her head and c/o bil hip pain pt did have a temp of 100.8   Allergies Allergies  Allergen Reactions  . Metoprolol Other (See Comments)    allopoecia  . Phenergan [Promethazine Hcl] Other (See Comments)    Facial numbness  . Prochlorperazine Edisylate Anaphylaxis  . Zetia [Ezetimibe] Nausea Only and Other (See Comments)    Abdominal pain  . Compazine [Prochlorperazine]     Facial  Paralysis       . Sulfur Other (See Comments)    paralyzes  face   . Niacin Rash  . Sulfonamide Derivatives Nausea And Vomiting    Level of Care/Admitting Diagnosis ED Disposition    ED Disposition Condition Comment   Admit  Hospital Area: [facility] [[id number]]  Level of Care: Med-Surg [16]  I expect the patient will be discharged within 24 hours: Yes  LOW acuity---Tx typically complete <24 hrs---ACUTE conditions typically can be evaluated <24 hours---LABS likely to return to acceptable levels <24 hours---IS near functional baseline---EXPECTED to return to current living arrangement---NOT newly hypoxic: Meets criteria for 5C-Observation unit  Diagnosis: UTI (urinary tract infection) [102725]  Admitting Physician: Angie Fava [[id number]]  Attending Physician: Angie Fava [[id number]]  Possible Covid Disease Patient Isolation: N/A  PT Class (Do Not Modify): Observation [104]  PT Acc Code (Do Not Modify): Observation [10022]       B Medical/Surgery History Past Medical History:  Diagnosis Date  .  Allergy   . Asthma   . CAD (coronary artery disease)    s/p CABG x 5 in 1997 & CABG x 3 in 2010; Last cath in April of 2010 showed grafts to be patent. EF is 40 to 45%  . CHF (congestive heart failure) (HCC)    EF is 40 to 45%  . Depression    following bypass surgery  . Diverticulosis   . Endometriosis   . GERD (gastroesophageal reflux disease)   . Hepatitis A   . Hiatal hernia   . History of cerebrovascular disease   . History of vertebral fracture    at [facility]  . Hyperlipemia   . Hypertension   . LBP (low back pain)   . Mixed incontinence urge and stress 04/22/2013  . Nocturia 04/22/2013  . Pleural effusion    post surgical  . PVD (peripheral vascular disease) (HCC)    S/P right CEA  . Sleep apnea   . Stroke [facility]) 2010   Past Surgical History:  Procedure Laterality Date  . APPENDECTOMY    . CARDIAC CATHETERIZATION  April 2010   Grafts were patent. EF is 40 to 45%  . CAROTID ENDARTERECTOMY     right  . CORONARY ARTERY BYPASS GRAFT  [id number]   Original surgery in 1997 x 5; Redo CABG x 3 in 2010 including free RIMA to distal LAD, SVG to OM and SVG to left posterolateral  . KNEE SURGERY     left  . LEFT HEART CATHETERIZATION WITH CORONARY/GRAFT ANGIOGRAM N/A 03/12/2013  Procedure: LEFT HEART CATHETERIZATION WITH Isabel Caprice;  Surgeon: Peter M Swaziland, MD;  Location: [facility] CATH LAB;  Service: Cardiovascular;  Laterality: N/A;  . PARTIAL COLECTOMY     due to colon abcess     A IV Location/Drains/Wounds Patient Lines/Drains/Airways Status   Active Line/Drains/Airways    Name:   Placement date:   Placement time:   Site:   Days:   Peripheral IV 09/10/18 Left Antecubital   09/10/18    2102    Antecubital   less than 1   External Urinary Catheter   09/05/17    0600    -   370          Intake/Output Last 24 hours No intake or output data in the 24 hours ending 09/10/18 2353  Labs/Imaging Results for orders placed or performed during the hospital encounter of  09/10/18 (from the past 48 hour(s))  CBC with Differential     Status: None   Collection Time: 09/10/18  7:40 PM  Result Value Ref Range   WBC 9.2 4.0 - 10.5 K/uL   RBC 3.94 3.87 - 5.11 MIL/uL   Hemoglobin 12.1 12.0 - 15.0 g/dL   HCT 16.1 09.6 - 04.5 %   MCV 93.7 80.0 - 100.0 fL   MCH 30.7 26.0 - 34.0 pg   MCHC 32.8 30.0 - 36.0 g/dL   RDW 40.9 81.1 - 91.4 %   Platelets 204 150 - 400 K/uL   nRBC 0.0 0.0 - 0.2 %   Neutrophils Relative % 65 %   Neutro Abs 6.1 1.7 - 7.7 K/uL   Lymphocytes Relative 25 %   Lymphs Abs 2.3 0.7 - 4.0 K/uL   Monocytes Relative 8 %   Monocytes Absolute 0.7 0.1 - 1.0 K/uL   Eosinophils Relative 1 %   Eosinophils Absolute 0.1 0.0 - 0.5 K/uL   Basophils Relative 1 %   Basophils Absolute 0.1 0.0 - 0.1 K/uL   Immature Granulocytes 0 %   Abs Immature Granulocytes 0.02 0.00 - 0.07 K/uL    Comment: Performed at [facility], [address] [place], [place] [address]  Comprehensive metabolic panel     Status: Abnormal   Collection Time: 09/10/18  7:40 PM  Result Value Ref Range   Sodium 139 135 - 145 mmol/L   Potassium 4.2 3.5 - 5.1 mmol/L   Chloride 104 98 - 111 mmol/L   CO2 25 22 - 32 mmol/L   Glucose, Bld 116 (H) 70 - 99 mg/dL   BUN 16 8 - 23 mg/dL   Creatinine, Ser 6.21 0.44 - 1.00 mg/dL   Calcium 9.5 8.9 - 30.8 mg/dL   Total Protein 6.4 (L) 6.5 - 8.1 g/dL   Albumin 3.6 3.5 - 5.0 g/dL   AST 23 15 - 41 U/L   ALT 14 0 - 44 U/L   Alkaline Phosphatase 56 38 - 126 U/L   Total Bilirubin 0.5 0.3 - 1.2 mg/dL   GFR calc non Af Amer 55 (L) >60 mL/min   GFR calc Af Amer >60 >60 mL/min   Anion gap 10 5 - 15    Comment: Performed at [facility], [address] [place], [place] [address]  Urinalysis, Routine w reflex microscopic     Status: Abnormal   Collection Time: 09/10/18  7:40 PM  Result Value Ref Range   Color, Urine YELLOW YELLOW   APPearance CLOUDY (A) CLEAR   Specific Gravity, Urine 1.010 1.005 - 1.030  pH 6.0 5.0 - 8.0   Glucose,  UA NEGATIVE NEGATIVE mg/dL   Hgb urine dipstick MODERATE (A) NEGATIVE   Bilirubin Urine NEGATIVE NEGATIVE   Ketones, ur NEGATIVE NEGATIVE mg/dL   Protein, ur NEGATIVE NEGATIVE mg/dL   Nitrite NEGATIVE NEGATIVE   Leukocytes,Ua SMALL (A) NEGATIVE   RBC / HPF 0-5 0 - 5 RBC/hpf   WBC, UA 6-10 0 - 5 WBC/hpf   Bacteria, UA MANY (A) NONE SEEN   Squamous Epithelial / LPF 0-5 0 - 5   Uric Acid Crys, UA PRESENT     Comment: Performed at [facility], [address] [place], [place] [id number]   Ct Head Wo Contrast  Result Date: 09/10/2018 CLINICAL DATA:  Fall with head trauma EXAM: CT HEAD WITHOUT CONTRAST CT MAXILLOFACIAL WITHOUT CONTRAST CT CERVICAL SPINE WITHOUT CONTRAST TECHNIQUE: Multidetector CT imaging of the head, cervical spine, and maxillofacial structures were performed using the standard protocol without intravenous contrast. Multiplanar CT image reconstructions of the cervical spine and maxillofacial structures were also generated. COMPARISON:  CT head and cervical spine 09/03/2017 FINDINGS: CT HEAD FINDINGS Brain: There is no mass, hemorrhage or extra-axial collection. The size and configuration of the ventricles and extra-axial CSF spaces are normal. The brain parenchyma is normal, without evidence of acute or chronic infarction. Vascular: No abnormal hyperdensity of the major intracranial arteries or dural venous sinuses. No intracranial atherosclerosis. Skull: Small left frontal scalp hematoma.  No skull fracture. CT MAXILLOFACIAL FINDINGS Osseous: --Complex facial fracture types: No LeFort, zygomaticomaxillary complex or nasoorbitoethmoidal fracture. --Simple fracture types: Rightward displacement of anterior bilateral nasal bone fractures. --Mandible: No fracture or dislocation. Orbits: The globes are intact. Normal appearance of the intra- and extraconal fat. Symmetric extraocular muscles and optic nerves. Sinuses: No fluid levels or advanced mucosal thickening. Soft tissues: Normal  visualized extracranial soft tissues. CT CERVICAL SPINE FINDINGS Alignment: No static subluxation. Facets are aligned. Occipital condyles and the lateral masses of C1-C2 are aligned. Skull base and vertebrae: No acute fracture. Soft tissues and spinal canal: No prevertebral fluid or swelling. No visible canal hematoma. Disc levels: No advanced spinal canal or neural foraminal stenosis. Upper chest: No pneumothorax, pulmonary nodule or pleural effusion. Other: Normal visualized paraspinal cervical soft tissues. IMPRESSION: 1. No acute intracranial abnormality. 2. No acute fracture or static subluxation of the cervical spine. 3. Small left frontal scalp hematoma without skull fracture. 4. Bilateral anterior nasal bone fractures with rightward displacement. No other facial fracture. Electronically Signed   By: Deatra RobinsonKevin  Herman M.D.   On: 09/10/2018 20:52   Ct Cervical Spine Wo Contrast  Result Date: 09/10/2018 CLINICAL DATA:  Fall with head trauma EXAM: CT HEAD WITHOUT CONTRAST CT MAXILLOFACIAL WITHOUT CONTRAST CT CERVICAL SPINE WITHOUT CONTRAST TECHNIQUE: Multidetector CT imaging of the head, cervical spine, and maxillofacial structures were performed using the standard protocol without intravenous contrast. Multiplanar CT image reconstructions of the cervical spine and maxillofacial structures were also generated. COMPARISON:  CT head and cervical spine 09/03/2017 FINDINGS: CT HEAD FINDINGS Brain: There is no mass, hemorrhage or extra-axial collection. The size and configuration of the ventricles and extra-axial CSF spaces are normal. The brain parenchyma is normal, without evidence of acute or chronic infarction. Vascular: No abnormal hyperdensity of the major intracranial arteries or dural venous sinuses. No intracranial atherosclerosis. Skull: Small left frontal scalp hematoma.  No skull fracture. CT MAXILLOFACIAL FINDINGS Osseous: --Complex facial fracture types: No LeFort, zygomaticomaxillary complex or  nasoorbitoethmoidal fracture. --Simple fracture types: Rightward displacement of  anterior bilateral nasal bone fractures. --Mandible: No fracture or dislocation. Orbits: The globes are intact. Normal appearance of the intra- and extraconal fat. Symmetric extraocular muscles and optic nerves. Sinuses: No fluid levels or advanced mucosal thickening. Soft tissues: Normal visualized extracranial soft tissues. CT CERVICAL SPINE FINDINGS Alignment: No static subluxation. Facets are aligned. Occipital condyles and the lateral masses of C1-C2 are aligned. Skull base and vertebrae: No acute fracture. Soft tissues and spinal canal: No prevertebral fluid or swelling. No visible canal hematoma. Disc levels: No advanced spinal canal or neural foraminal stenosis. Upper chest: No pneumothorax, pulmonary nodule or pleural effusion. Other: Normal visualized paraspinal cervical soft tissues. IMPRESSION: 1. No acute intracranial abnormality. 2. No acute fracture or static subluxation of the cervical spine. 3. Small left frontal scalp hematoma without skull fracture. 4. Bilateral anterior nasal bone fractures with rightward displacement. No other facial fracture. Electronically Signed   By: Deatra Robinson M.D.   On: 09/10/2018 20:52   Ct Abdomen Pelvis W Contrast  Result Date: 09/10/2018 CLINICAL DATA:  85 year old female with diarrhea and a fall EXAM: CT ABDOMEN AND PELVIS WITH CONTRAST TECHNIQUE: Multidetector CT imaging of the abdomen and pelvis was performed using the standard protocol following bolus administration of intravenous contrast. CONTRAST:  OMNIPAQUE IOHEXOL 300 MG/ML  SOLN COMPARISON:  09/03/2017 FINDINGS: Lower chest: Scarring/atelectasis at the lung bases, with no acute finding. Hepatobiliary: Unremarkable liver.  Unremarkable gallbladder. Pancreas: Unremarkable Spleen: Unremarkable Adrenals/Urinary Tract: Unremarkable adrenal glands. Right kidney unremarkable with no hydronephrosis or nephrolithiasis.  Unremarkable course the right ureter. Left kidney without hydronephrosis or nephrolithiasis. Bosniak 1 cyst on the lateral cortex. Unremarkable course the left ureter. Unremarkable urinary bladder. Stomach/Bowel: Hiatal hernia. Small bowel unremarkable, decompressed without acute inflammatory changes or wall thickening. No inflammatory changes of the mesentery. Appendix is not visualized, however, no inflammatory changes are present adjacent to the cecum to indicate an appendicitis. Extensive diverticular disease without focal inflammatory changes to suggest acute diverticulitis. No transition point. Vascular/Lymphatic: Advanced atherosclerotic changes of the abdominal aorta. Calcifications at the origin of the celiac artery and superior mesenteric artery. Branches remain patent. Calcifications at the origin the bilateral renal arteries. Bilateral iliac arteries and proximal femoral arteries patent. Reproductive: Unremarkable uterus Other: No inflammatory changes of the abdomen wall.  No hernia Musculoskeletal: No acute displaced fracture. Degenerative changes of the visualized thoracolumbar spine. No bony canal narrowing. IMPRESSION: No acute CT finding. Diverticular disease without evidence of acute diverticulitis. Advanced aortic atherosclerosis, including mesenteric arterial disease and renal artery disease. Aortic Atherosclerosis (ICD10-I70.0). Electronically Signed   By: Gilmer Mor D.O.   On: 09/10/2018 21:58   Dg Pelvis Portable  Result Date: 09/10/2018 CLINICAL DATA:  85 y/o  F; status post fall. EXAM: PORTABLE PELVIS 1-2 VIEWS COMPARISON:  09/03/2017 CT abdomen and pelvis. FINDINGS: There is no evidence of pelvic fracture or diastasis. No pelvic bone lesions are seen. Bones are demineralized. Mild-to-moderate osteoarthrosis of the hip joints with loss of joint space and periarticular osteophytosis. IMPRESSION: Negative. Electronically Signed   By: Mitzi Hansen M.D.   On: 09/10/2018 19:47    Dg Chest Portable 1 View  Result Date: 09/10/2018 CLINICAL DATA:  85 y/o  F; status post fall. EXAM: PORTABLE CHEST 1 VIEW COMPARISON:  10/26/2017 chest radiograph FINDINGS: Stable cardiac silhouette within normal limits given projection and technique. Post median sternotomy and CABG. Sternotomy wires are aligned and intact. Aortic atherosclerosis with calcification. Clear lungs. No pleural effusion or pneumothorax. Chronic left posterior rib fractures. Stable  midthoracic compression deformity post augmentation. No acute osseous abnormality identified. IMPRESSION: No acute process identified.  Stable chest radiograph. Electronically Signed   By: Mitzi Hansen M.D.   On: 09/10/2018 19:44   Dg Hand Complete Right  Result Date: 09/10/2018 CLINICAL DATA:  85 y/o  F; status post fall. EXAM: RIGHT HAND - COMPLETE 3+ VIEW COMPARISON:  None. FINDINGS: There is no evidence of fracture or dislocation. Osteoarthrosis interphalangeal joints with loss of the joint space and osteophytosis most pronounced in the digit 1-3 distal interphalangeal joints. Bones are demineralized. IMPRESSION: No acute fracture or dislocation identified. Electronically Signed   By: Mitzi Hansen M.D.   On: 09/10/2018 19:52   Ct Maxillofacial Wo Contrast  Result Date: 09/10/2018 CLINICAL DATA:  Fall with head trauma EXAM: CT HEAD WITHOUT CONTRAST CT MAXILLOFACIAL WITHOUT CONTRAST CT CERVICAL SPINE WITHOUT CONTRAST TECHNIQUE: Multidetector CT imaging of the head, cervical spine, and maxillofacial structures were performed using the standard protocol without intravenous contrast. Multiplanar CT image reconstructions of the cervical spine and maxillofacial structures were also generated. COMPARISON:  CT head and cervical spine 09/03/2017 FINDINGS: CT HEAD FINDINGS Brain: There is no mass, hemorrhage or extra-axial collection. The size and configuration of the ventricles and extra-axial CSF spaces are normal. The brain  parenchyma is normal, without evidence of acute or chronic infarction. Vascular: No abnormal hyperdensity of the major intracranial arteries or dural venous sinuses. No intracranial atherosclerosis. Skull: Small left frontal scalp hematoma.  No skull fracture. CT MAXILLOFACIAL FINDINGS Osseous: --Complex facial fracture types: No LeFort, zygomaticomaxillary complex or nasoorbitoethmoidal fracture. --Simple fracture types: Rightward displacement of anterior bilateral nasal bone fractures. --Mandible: No fracture or dislocation. Orbits: The globes are intact. Normal appearance of the intra- and extraconal fat. Symmetric extraocular muscles and optic nerves. Sinuses: No fluid levels or advanced mucosal thickening. Soft tissues: Normal visualized extracranial soft tissues. CT CERVICAL SPINE FINDINGS Alignment: No static subluxation. Facets are aligned. Occipital condyles and the lateral masses of C1-C2 are aligned. Skull base and vertebrae: No acute fracture. Soft tissues and spinal canal: No prevertebral fluid or swelling. No visible canal hematoma. Disc levels: No advanced spinal canal or neural foraminal stenosis. Upper chest: No pneumothorax, pulmonary nodule or pleural effusion. Other: Normal visualized paraspinal cervical soft tissues. IMPRESSION: 1. No acute intracranial abnormality. 2. No acute fracture or static subluxation of the cervical spine. 3. Small left frontal scalp hematoma without skull fracture. 4. Bilateral anterior nasal bone fractures with rightward displacement. No other facial fracture. Electronically Signed   By: Deatra Robinson M.D.   On: 09/10/2018 20:52    Pending Labs Unresulted Labs (From admission, onward)    Start     Ordered   09/10/18 1910  Urine culture  ONCE - STAT,   STAT     09/10/18 1911          Vitals/Pain Today's Vitals   09/10/18 2000 09/10/18 2115 09/10/18 2200 09/10/18 2215  BP:  (!) 125/50 (!) 144/72 (!) 123/44  Pulse: 72 71 80 68  Resp: Temp:       TempSrc:      SpO2: 96% 99% 100% 97%    Isolation Precautions No active isolations  Medications Medications  cefTRIAXone (ROCEPHIN) 1 g in sodium chloride 0.9 % 100 mL IVPB (has no administration in time range)  acetaminophen (TYLENOL) tablet 650 mg (650 mg Oral Given 09/10/18 1950)  iohexol (OMNIPAQUE) 300 MG/ML solution 100 mL (100 mLs Intravenous Contrast Given 09/10/18 2129)  sodium chloride 0.9 % bolus 500 mL (500 mLs Intravenous New Bag/Given 09/10/18 2319)    Mobility walks High fall risk   Focused Assessments    R Recommendations: See Admitting Provider Note  Report given to:   Additional Notes:

## 2018-09-11 ENCOUNTER — Other Ambulatory Visit: Payer: Self-pay

## 2018-09-11 DIAGNOSIS — R531 Weakness: Secondary | ICD-10-CM | POA: Diagnosis not present

## 2018-09-11 DIAGNOSIS — I5032 Chronic diastolic (congestive) heart failure: Secondary | ICD-10-CM | POA: Diagnosis not present

## 2018-09-11 DIAGNOSIS — Y92009 Unspecified place in unspecified non-institutional (private) residence as the place of occurrence of the external cause: Secondary | ICD-10-CM

## 2018-09-11 DIAGNOSIS — N3000 Acute cystitis without hematuria: Secondary | ICD-10-CM | POA: Diagnosis not present

## 2018-09-11 DIAGNOSIS — W19XXXA Unspecified fall, initial encounter: Secondary | ICD-10-CM

## 2018-09-11 LAB — CBC WITH DIFFERENTIAL/PLATELET
Abs Immature Granulocytes: 0.02 10*3/uL (ref 0.00–0.07)
Basophils Absolute: 0 10*3/uL (ref 0.0–0.1)
Basophils Relative: 1 %
Eosinophils Absolute: 0.1 10*3/uL (ref 0.0–0.5)
Eosinophils Relative: 1 %
HCT: 36.3 % (ref 36.0–46.0)
Hemoglobin: 11.7 g/dL — ABNORMAL LOW (ref 12.0–15.0)
Immature Granulocytes: 0 %
Lymphocytes Relative: 37 %
Lymphs Abs: 2.6 10*3/uL (ref 0.7–4.0)
MCH: 29.5 pg (ref 26.0–34.0)
MCHC: 32.2 g/dL (ref 30.0–36.0)
MCV: 91.4 fL (ref 80.0–100.0)
Monocytes Absolute: 0.6 10*3/uL (ref 0.1–1.0)
Monocytes Relative: 9 %
Neutro Abs: 3.6 10*3/uL (ref 1.7–7.7)
Neutrophils Relative %: 52 %
Platelets: 188 10*3/uL (ref 150–400)
RBC: 3.97 MIL/uL (ref 3.87–5.11)
RDW: 12.4 % (ref 11.5–15.5)
WBC: 6.9 10*3/uL (ref 4.0–10.5)
nRBC: 0 % (ref 0.0–0.2)

## 2018-09-11 LAB — BASIC METABOLIC PANEL
Anion gap: 10 (ref 5–15)
BUN: 11 mg/dL (ref 8–23)
CO2: 23 mmol/L (ref 22–32)
Calcium: 9 mg/dL (ref 8.9–10.3)
Chloride: 105 mmol/L (ref 98–111)
Creatinine, Ser: 0.79 mg/dL (ref 0.44–1.00)
GFR calc Af Amer: >60 mL/min (ref >60)
GFR calc non Af Amer: >60 mL/min (ref >60)
Glucose, Bld: 94 mg/dL (ref 70–99)
Potassium: 3.8 mmol/L (ref 3.5–5.1)
Sodium: 138 mmol/L (ref 135–145)

## 2018-09-11 LAB — CK: Total CK: 69 U/L (ref 38–234)

## 2018-09-11 LAB — MAGNESIUM: Magnesium: 1.8 mg/dL (ref 1.7–2.4)

## 2018-09-11 LAB — TSH: TSH: 2.246 u[IU]/mL (ref 0.350–4.500)

## 2018-09-11 MED ORDER — PANTOPRAZOLE SODIUM 40 MG PO TBEC
40.0000 mg | DELAYED_RELEASE_TABLET | Freq: Every day | ORAL | Status: DC
  Administered 2018-09-11: 40 mg via ORAL
  Filled 2018-09-11: qty 1

## 2018-09-11 MED ORDER — ENOXAPARIN SODIUM 40 MG/0.4ML ~~LOC~~ SOLN: 40.0000 mg | SUBCUTANEOUS | Status: DC

## 2018-09-11 MED ORDER — CEPHALEXIN 250 MG PO CAPS: 250.0000 mg | ORAL_CAPSULE | Freq: Three times a day (TID) | ORAL | 0 refills | Status: AC

## 2018-09-11 MED ORDER — ISOSORBIDE MONONITRATE ER 60 MG PO TB24
60.0000 mg | ORAL_TABLET | Freq: Every day | ORAL | Status: DC
  Filled 2018-09-11: qty 1

## 2018-09-11 MED ORDER — ASPIRIN EC 81 MG PO TBEC: 81.0000 mg | DELAYED_RELEASE_TABLET | Freq: Every day | ORAL | 3 refills | Status: AC

## 2018-09-11 MED ORDER — ACETAMINOPHEN 650 MG RE SUPP: 650.0000 mg | Freq: Four times a day (QID) | RECTAL | Status: DC | PRN

## 2018-09-11 MED ORDER — SODIUM CHLORIDE 0.9 % IV SOLN
INTRAVENOUS | Status: AC
  Administered 2018-09-11: 02:00:00 via INTRAVENOUS

## 2018-09-11 MED ORDER — ISOSORBIDE MONONITRATE ER 30 MG PO TB24: 30.0000 mg | ORAL_TABLET | Freq: Every day | ORAL | Status: DC

## 2018-09-11 MED ORDER — POTASSIUM CHLORIDE ER 10 MEQ PO CPCR: 10.0000 meq | ORAL_CAPSULE | Freq: Every day | ORAL | 0 refills | Status: AC

## 2018-09-11 MED ORDER — ROSUVASTATIN CALCIUM 20 MG PO TABS: 20.0000 mg | ORAL_TABLET | Freq: Every day | ORAL | Status: DC

## 2018-09-11 MED ORDER — ACETAMINOPHEN 325 MG PO TABS: 650.0000 mg | ORAL_TABLET | Freq: Four times a day (QID) | ORAL | 0 refills | Status: AC | PRN

## 2018-09-11 MED ORDER — NEBIVOLOL HCL 10 MG PO TABS
10.0000 mg | ORAL_TABLET | Freq: Every day | ORAL | Status: DC
  Administered 2018-09-11: 10 mg via ORAL
  Filled 2018-09-11: qty 1

## 2018-09-11 MED ORDER — ASPIRIN EC 81 MG PO TBEC
81.0000 mg | DELAYED_RELEASE_TABLET | Freq: Every day | ORAL | Status: DC
  Administered 2018-09-11: 81 mg via ORAL
  Filled 2018-09-11: qty 1

## 2018-09-11 MED ORDER — NEBIVOLOL HCL 10 MG PO TABS: 10.0000 mg | ORAL_TABLET | Freq: Every day | ORAL | 2 refills | Status: DC

## 2018-09-11 MED ORDER — SODIUM CHLORIDE 0.9 % IV SOLN
1.0000 g | INTRAVENOUS | Status: DC
  Filled 2018-09-11: qty 10

## 2018-09-11 MED ORDER — ACETAMINOPHEN 325 MG PO TABS
650.0000 mg | ORAL_TABLET | Freq: Four times a day (QID) | ORAL | Status: DC | PRN
  Administered 2018-09-11: 650 mg via ORAL
  Filled 2018-09-11: qty 2

## 2018-09-11 NOTE — Discharge Summary (Signed)
[name], is a 85 y.o. female  DOB [birth year]  MRN [id number].  Admission date:  09/10/2018  Admitting Physician  Angie Fava, DO  Discharge Date:  09/11/2018   Primary MD  Donita Brooks, MD  Recommendations for primary care physician for things to follow:   Out of bed with assistance--- Fall Precautions Low-salt diet advised Possible UTI--- take cephalexin 250 mg 3 times a day for 4 days as prescribed, urine culture is pending at this time  Admission Diagnosis  Closed fracture of nasal bone, initial encounter [S02.2XXA] Fall, initial encounter [id number]] Urinary tract infection with hematuria, site unspecified [N39.0, R31.9] Diarrhea, unspecified type [R19.7]   Discharge Diagnosis  Closed fracture of nasal bone, initial encounter [S02.2XXA] Fall, initial encounter [id number]] Urinary tract infection with hematuria, site unspecified [N39.0, R31.9] Diarrhea, unspecified type [R19.7]    Principal Problem:   UTI (urinary tract infection) Active Problems:   Chronic diastolic CHF (congestive heart failure) (HCC)   Fall at home, initial encounter   Generalized weakness      Past Medical History:  Diagnosis Date   Allergy    Asthma    CAD (coronary artery disease)    s/p CABG x 5 in 1997 & CABG x 3 in 2010; Last cath in April of 2010 showed grafts to be patent. EF is 40 to 45%   CHF (congestive heart failure) (HCC)    EF is 40 to 45%   Depression    following bypass surgery   Diverticulosis    Endometriosis    GERD (gastroesophageal reflux disease)    Hepatitis A    Hiatal hernia    History of cerebrovascular disease    History of vertebral fracture    at T8   Hyperlipemia    Hypertension    LBP (low back pain)    Mixed incontinence urge and stress 04/22/2013   Nocturia 04/22/2013   Pleural effusion    post surgical   PVD (peripheral vascular disease)  (HCC)    S/P right CEA   Sleep apnea    Stroke [facility]) 2010    Past Surgical History:  Procedure Laterality Date   APPENDECTOMY     CARDIAC CATHETERIZATION  April 2010   Grafts were patent. EF is 40 to 45%   CAROTID ENDARTERECTOMY     right   CORONARY ARTERY BYPASS GRAFT  [id number]   Original surgery in 1997 x 5; Redo CABG x 3 in 2010 including free RIMA to distal LAD, SVG to OM and SVG to left posterolateral   KNEE SURGERY     left   LEFT HEART CATHETERIZATION WITH CORONARY/GRAFT ANGIOGRAM N/A 03/12/2013   Procedure: LEFT HEART CATHETERIZATION WITH Isabel Caprice;  Surgeon: Peter M Swaziland, MD;  Location: [facility] C[facility] LAB;  Service: Cardiovascular;  Laterality: N/A;   PARTIAL COLECTOMY     due to colon abcess      HPI  from the history and physical done on the day of admission:    Chief Complaint: Ground-level  mechanical fall  HPI: [name] is a 85 y.o. female with medical history significant for coronary artery disease status post 5 vessel CABG in 1997 followed by three-vessel redo in 2010, chronic diastolic heart failure, hypertension, who is admitted to [facility] on 09/10/2018 with acute cystitis presenting from home to the [facility] emergency department complaining of a ground-level fall.   The patient reports that she was ambulating at home earlier today when she tripped over her cane prompting her to fall to the tile floor, striking the anterior portion of her face off of the surface.  She reports that this fall was purely mechanical in nature, and denied any associated loss of consciousness or any associated chest pain, shortness of breath, palpitations, or diaphoresis.  Subsequent to the fall, she reports pain across her nose but denies any associated neck, shoulder, elbow, hip, knee, or ankle discomfort.  The patient, who lives at home by herself, and is able to independently complete all of her ADLs at baseline, was ultimately able to get off  of the floor after 1 hour and contacted EMS, who brought patient to [facility] emergency department for further evaluation of her ground-level mechanical fall.  The patient reports 1 week of increased urinary frequency in the absence of any associated dysuria or gross hematuria.  She denies any subjective fever, chills, rigors, or generalized myalgias.  Denies any rhinitis, rhinorrhea, cough, shortness of breath or any known or suspected COVID-19 exposures.  Over the last week leading up to tonight's presentation, she also notes generalized weakness in the absence of any acute focal neurologic deficits.  She denies any overt abdominal discomfort nor any nausea or vomiting.    ED Course: Vital signs in the emergency department notable for the following: Temperature maximum 97.7, heart rate 71-80; blood pressure ranged from 123/44-140 8/62; respiratory rate 13-18, and oxygen saturation 9600% on room air.  Labs in the ED were notable for the following: CMP notable for sodium 139, bicarbonate 25, creatinine 0.94 relative to baseline creatinine ranges 0.8-0.9.  CBC notable for white blood cell count of 9200 with 65% neutrophils.  Urinalysis was associated with a cloudy appearing specimen demonstrating 6-10 white blood cells, many bacteria and was positive for leukocyte esterace.   Chest x-ray showed no evidence of acute cardiopulmonary process.  Plain films of the pelvis showed no evidence of acute fracture.  Plain films of the right hand showed no evidence of acute fracture or dislocation.  Noncontrast CT of the head demonstrated no evidence of acute intracranial process going clear no evidence of intracranial bleed.  CT of the cervical spine showed evidence of acute cervical spinal fracture.  CT maxillofacial showed bilateral anterior nasal bone fracture with right-ward placement.  While still emergency department, the following were administered: Tylenol 650 mg p.o. x1, IV normal saline has 100 cc  bolus, and Rocephin 1 g IV x1.     Hospital Course:   Brief summary [name] is a 85 y.o. female with medical history significant for coronary artery disease status post 5 vessel CABG in 1997 followed by three-vessel redo in 2010, chronic diastolic heart failure, hypertension, who is admitted to [facility] on 09/10/2018 with acute cystitis and generalized weakness after presenting from home to [facility] emergency department complaining of a ground-level fall.  A/p 1)Possible UTI--- patient presented with mechanical fall and urinary frequency, patient received IV Rocephin, will discharge on p.o. Keflex for up to 4 days.  Urine culture is pending at the time of discharge  2)Generalized weakness/debility status post mechanical fall-----work-up and imaging studies without acute findings--- physical therapy evaluation appreciated, they recommend SNF rehab  3)Social/Ethics--plan of care and CODE STATUS discussed with patient POA Ms. [name] (Niece), patient is a DNR/DNI  Discharge Condition: stable  Follow UP  Contact information for after-discharge care    Destination    [facility] Preferred SNF .   Service:  Skilled Nursing Contact information: [address] [place] [address] [phone]             Diet and Activity recommendation:  As advised  Discharge Instructions    Discharge Instructions    Call MD for:  difficulty breathing, headache or visual disturbances   Complete by:  As directed    Call MD for:  persistant dizziness or light-headedness   Complete by:  As directed    Call MD for:  persistant nausea and vomiting   Complete by:  As directed    Call MD for:  severe uncontrolled pain   Complete by:  As directed    Call MD for:  temperature >100.4   Complete by:  As directed    Diet - low sodium heart healthy   Complete by:  As directed    Discharge instructions   Complete by:  As directed      Out of bed with assistance--- Fall Precautions Low-salt diet advised Possible UTI--- take cephalexin 250 mg 3 times a day for 4 days as prescribed, urine culture is pending at this time   Increase activity slowly   Complete by:  As directed    Out of bed with assistance----fall precautions        Discharge Medications     Allergies as of 09/11/2018      Reactions   Metoprolol Other (See Comments)   allopoecia   Phenergan [promethazine Hcl] Other (See Comments)   Facial numbness   Prochlorperazine Edisylate Anaphylaxis   Zetia [ezetimibe] Nausea Only, Other (See Comments)   Abdominal pain   Compazine [prochlorperazine]    Facial  Paralysis    Sulfur Other (See Comments)   paralyzes  face   Niacin Rash   Sulfonamide Derivatives Nausea And Vomiting      Medication List    TAKE these medications   acetaminophen 325 MG tablet Commonly known as:  TYLENOL Take 2 tablets (650 mg total) by mouth every 6 (six) hours as needed for mild pain, fever or headache (or Fever >/= 101).   aspirin EC 81 MG tablet Take 1 tablet (81 mg total) by mouth daily with breakfast. What changed:  when to take this   cephALEXin 250 MG capsule Commonly known as:  KEFLEX Take 1 capsule (250 mg total) by mouth 3 (three) times daily for 4 days.   esomeprazole 20 MG capsule Commonly known as:  NEXIUM Take 20 mg by mouth daily at 12 noon.   isosorbide mononitrate 60 MG 24 hr tablet Commonly known as:  IMDUR Take 1 tablet (60 mg total) by mouth daily.   nebivolol 10 MG tablet Commonly known as:  Bystolic Take 1 tablet (10 mg total) by mouth daily. Hold for SBP < 110 mmhg and HR < 60 BPM Start taking on:  September 12, 2018 What changed:    how much to take  additional instructions   nitroGLYCERIN 0.4 MG SL tablet Commonly known as:  NITROSTAT Place 1 tablet (0.4 mg total) under the  tongue every 5 (five) minutes as needed for chest pain.   potassium chloride 10 MEQ CR capsule Commonly known  as:  MICRO-K Take 1 capsule (10 mEq total) by mouth daily. What changed:  additional instructions   rosuvastatin 20 MG tablet Commonly known as:  CRESTOR Take 1 tablet (20 mg total) by mouth daily.       Major procedures and Radiology Reports - PLEASE review detailed and final reports for all details, in brief -    Ct Head Wo Contrast  Result Date: 09/10/2018 CLINICAL DATA:  Fall with head trauma EXAM: CT HEAD WITHOUT CONTRAST CT MAXILLOFACIAL WITHOUT CONTRAST CT CERVICAL SPINE WITHOUT CONTRAST TECHNIQUE: Multidetector CT imaging of the head, cervical spine, and maxillofacial structures were performed using the standard protocol without intravenous contrast. Multiplanar CT image reconstructions of the cervical spine and maxillofacial structures were also generated. COMPARISON:  CT head and cervical spine 09/03/2017 FINDINGS: CT HEAD FINDINGS Brain: There is no mass, hemorrhage or extra-axial collection. The size and configuration of the ventricles and extra-axial CSF spaces are normal. The brain parenchyma is normal, without evidence of acute or chronic infarction. Vascular: No abnormal hyperdensity of the major intracranial arteries or dural venous sinuses. No intracranial atherosclerosis. Skull: Small left frontal scalp hematoma.  No skull fracture. CT MAXILLOFACIAL FINDINGS Osseous: --Complex facial fracture types: No LeFort, zygomaticomaxillary complex or nasoorbitoethmoidal fracture. --Simple fracture types: Rightward displacement of anterior bilateral nasal bone fractures. --Mandible: No fracture or dislocation. Orbits: The globes are intact. Normal appearance of the intra- and extraconal fat. Symmetric extraocular muscles and optic nerves. Sinuses: No fluid levels or advanced mucosal thickening. Soft tissues: Normal visualized extracranial soft tissues. CT CERVICAL SPINE FINDINGS Alignment: No static subluxation. Facets are aligned. Occipital condyles and the lateral masses of C1-C2 are  aligned. Skull base and vertebrae: No acute fracture. Soft tissues and spinal canal: No prevertebral fluid or swelling. No visible canal hematoma. Disc levels: No advanced spinal canal or neural foraminal stenosis. Upper chest: No pneumothorax, pulmonary nodule or pleural effusion. Other: Normal visualized paraspinal cervical soft tissues. IMPRESSION: 1. No acute intracranial abnormality. 2. No acute fracture or static subluxation of the cervical spine. 3. Small left frontal scalp hematoma without skull fracture. 4. Bilateral anterior nasal bone fractures with rightward displacement. No other facial fracture. Electronically Signed   By: Deatra RobinsonKevin  Herman M.D.   On: 09/10/2018 20:52   Ct Cervical Spine Wo Contrast  Result Date: 09/10/2018 CLINICAL DATA:  Fall with head trauma EXAM: CT HEAD WITHOUT CONTRAST CT MAXILLOFACIAL WITHOUT CONTRAST CT CERVICAL SPINE WITHOUT CONTRAST TECHNIQUE: Multidetector CT imaging of the head, cervical spine, and maxillofacial structures were performed using the standard protocol without intravenous contrast. Multiplanar CT image reconstructions of the cervical spine and maxillofacial structures were also generated. COMPARISON:  CT head and cervical spine 09/03/2017 FINDINGS: CT HEAD FINDINGS Brain: There is no mass, hemorrhage or extra-axial collection. The size and configuration of the ventricles and extra-axial CSF spaces are normal. The brain parenchyma is normal, without evidence of acute or chronic infarction. Vascular: No abnormal hyperdensity of the major intracranial arteries or dural venous sinuses. No intracranial atherosclerosis. Skull: Small left frontal scalp hematoma.  No skull fracture. CT MAXILLOFACIAL FINDINGS Osseous: --Complex facial fracture types: No LeFort, zygomaticomaxillary complex or nasoorbitoethmoidal fracture. --Simple fracture types: Rightward displacement of anterior bilateral nasal bone fractures. --Mandible: No fracture or dislocation. Orbits: The globes  are intact. Normal appearance of the intra- and extraconal fat. Symmetric extraocular muscles and optic nerves. Sinuses: No  fluid levels or advanced mucosal thickening. Soft tissues: Normal visualized extracranial soft tissues. CT CERVICAL SPINE FINDINGS Alignment: No static subluxation. Facets are aligned. Occipital condyles and the lateral masses of C1-C2 are aligned. Skull base and vertebrae: No acute fracture. Soft tissues and spinal canal: No prevertebral fluid or swelling. No visible canal hematoma. Disc levels: No advanced spinal canal or neural foraminal stenosis. Upper chest: No pneumothorax, pulmonary nodule or pleural effusion. Other: Normal visualized paraspinal cervical soft tissues. IMPRESSION: 1. No acute intracranial abnormality. 2. No acute fracture or static subluxation of the cervical spine. 3. Small left frontal scalp hematoma without skull fracture. 4. Bilateral anterior nasal bone fractures with rightward displacement. No other facial fracture. Electronically Signed   By: Deatra Robinson M.D.   On: 09/10/2018 20:52   Ct Abdomen Pelvis W Contrast  Result Date: 09/10/2018 CLINICAL DATA:  85 year old female with diarrhea and a fall EXAM: CT ABDOMEN AND PELVIS WITH CONTRAST TECHNIQUE: Multidetector CT imaging of the abdomen and pelvis was performed using the standard protocol following bolus administration of intravenous contrast. CONTRAST:  OMNIPAQUE IOHEXOL 300 MG/ML  SOLN COMPARISON:  09/03/2017 FINDINGS: Lower chest: Scarring/atelectasis at the lung bases, with no acute finding. Hepatobiliary: Unremarkable liver.  Unremarkable gallbladder. Pancreas: Unremarkable Spleen: Unremarkable Adrenals/Urinary Tract: Unremarkable adrenal glands. Right kidney unremarkable with no hydronephrosis or nephrolithiasis. Unremarkable course the right ureter. Left kidney without hydronephrosis or nephrolithiasis. Bosniak 1 cyst on the lateral cortex. Unremarkable course the left ureter. Unremarkable  urinary bladder. Stomach/Bowel: Hiatal hernia. Small bowel unremarkable, decompressed without acute inflammatory changes or wall thickening. No inflammatory changes of the mesentery. Appendix is not visualized, however, no inflammatory changes are present adjacent to the cecum to indicate an appendicitis. Extensive diverticular disease without focal inflammatory changes to suggest acute diverticulitis. No transition point. Vascular/Lymphatic: Advanced atherosclerotic changes of the abdominal aorta. Calcifications at the origin of the celiac artery and superior mesenteric artery. Branches remain patent. Calcifications at the origin the bilateral renal arteries. Bilateral iliac arteries and proximal femoral arteries patent. Reproductive: Unremarkable uterus Other: No inflammatory changes of the abdomen wall.  No hernia Musculoskeletal: No acute displaced fracture. Degenerative changes of the visualized thoracolumbar spine. No bony canal narrowing. IMPRESSION: No acute CT finding. Diverticular disease without evidence of acute diverticulitis. Advanced aortic atherosclerosis, including mesenteric arterial disease and renal artery disease. Aortic Atherosclerosis (ICD10-I70.0). Electronically Signed   By: Gilmer Mor D.O.   On: 09/10/2018 21:58   Dg Pelvis Portable  Result Date: 09/10/2018 CLINICAL DATA:  85 y/o  F; status post fall. EXAM: PORTABLE PELVIS 1-2 VIEWS COMPARISON:  09/03/2017 CT abdomen and pelvis. FINDINGS: There is no evidence of pelvic fracture or diastasis. No pelvic bone lesions are seen. Bones are demineralized. Mild-to-moderate osteoarthrosis of the hip joints with loss of joint space and periarticular osteophytosis. IMPRESSION: Negative. Electronically Signed   By: Mitzi Hansen M.D.   On: 09/10/2018 19:47   Dg Chest Portable 1 View  Result Date: 09/10/2018 CLINICAL DATA:  85 y/o  F; status post fall. EXAM: PORTABLE CHEST 1 VIEW COMPARISON:  10/26/2017 chest radiograph FINDINGS:  Stable cardiac silhouette within normal limits given projection and technique. Post median sternotomy and CABG. Sternotomy wires are aligned and intact. Aortic atherosclerosis with calcification. Clear lungs. No pleural effusion or pneumothorax. Chronic left posterior rib fractures. Stable midthoracic compression deformity post augmentation. No acute osseous abnormality identified. IMPRESSION: No acute process identified.  Stable chest radiograph. Electronically Signed   By: Mitzi Hansen M.D.   On:  09/10/2018 19:44   Dg Hand Complete Right  Result Date: 09/10/2018 CLINICAL DATA:  85 y/o  F; status post fall. EXAM: RIGHT HAND - COMPLETE 3+ VIEW COMPARISON:  None. FINDINGS: There is no evidence of fracture or dislocation. Osteoarthrosis interphalangeal joints with loss of the joint space and osteophytosis most pronounced in the digit 1-3 distal interphalangeal joints. Bones are demineralized. IMPRESSION: No acute fracture or dislocation identified. Electronically Signed   By: Mitzi Hansen M.D.   On: 09/10/2018 19:52   Ct Maxillofacial Wo Contrast  Result Date: 09/10/2018 CLINICAL DATA:  Fall with head trauma EXAM: CT HEAD WITHOUT CONTRAST CT MAXILLOFACIAL WITHOUT CONTRAST CT CERVICAL SPINE WITHOUT CONTRAST TECHNIQUE: Multidetector CT imaging of the head, cervical spine, and maxillofacial structures were performed using the standard protocol without intravenous contrast. Multiplanar CT image reconstructions of the cervical spine and maxillofacial structures were also generated. COMPARISON:  CT head and cervical spine 09/03/2017 FINDINGS: CT HEAD FINDINGS Brain: There is no mass, hemorrhage or extra-axial collection. The size and configuration of the ventricles and extra-axial CSF spaces are normal. The brain parenchyma is normal, without evidence of acute or chronic infarction. Vascular: No abnormal hyperdensity of the major intracranial arteries or dural venous sinuses. No intracranial  atherosclerosis. Skull: Small left frontal scalp hematoma.  No skull fracture. CT MAXILLOFACIAL FINDINGS Osseous: --Complex facial fracture types: No LeFort, zygomaticomaxillary complex or nasoorbitoethmoidal fracture. --Simple fracture types: Rightward displacement of anterior bilateral nasal bone fractures. --Mandible: No fracture or dislocation. Orbits: The globes are intact. Normal appearance of the intra- and extraconal fat. Symmetric extraocular muscles and optic nerves. Sinuses: No fluid levels or advanced mucosal thickening. Soft tissues: Normal visualized extracranial soft tissues. CT CERVICAL SPINE FINDINGS Alignment: No static subluxation. Facets are aligned. Occipital condyles and the lateral masses of C1-C2 are aligned. Skull base and vertebrae: No acute fracture. Soft tissues and spinal canal: No prevertebral fluid or swelling. No visible canal hematoma. Disc levels: No advanced spinal canal or neural foraminal stenosis. Upper chest: No pneumothorax, pulmonary nodule or pleural effusion. Other: Normal visualized paraspinal cervical soft tissues. IMPRESSION: 1. No acute intracranial abnormality. 2. No acute fracture or static subluxation of the cervical spine. 3. Small left frontal scalp hematoma without skull fracture. 4. Bilateral anterior nasal bone fractures with rightward displacement. No other facial fracture. Electronically Signed   By: Deatra Robinson M.D.   On: 09/10/2018 20:52    Micro Results   Recent Results (from the past 240 hour(s))  Urine culture     Status: Abnormal (Preliminary result)   Collection Time: 09/10/18  7:49 PM  Result Value Ref Range Status   Specimen Description URINE, CLEAN CATCH  Final   Special Requests   Final    NONE Performed at [facility], [address] [place], [place] [address]    Culture >=100,000 COLONIES/mL GRAM NEGATIVE RODS (A)  Final   Report Status PENDING  Incomplete   Today   Subjective    Ellyn Rubiano today has no new  complaints , resting comfortably, eating and drinking okay no chest pains no palpitations no dizziness no shortness of breath patient remains weak and unsteady   Patient has been seen and examined prior to discharge   Objective   Blood pressure (!) 114/47, pulse 61, temperature 97.9 F (36.6 C), temperature source Oral, resp. rate 14, SpO2 96 %.   Intake/Output Summary (Last 24 hours) at 09/11/2018 1243 Last data filed at 09/11/2018 0500 Gross per 24 hour  Intake --  Output 400 ml  Net -400 ml    Exam Gen:- Awake Alert, no acute distress  HEENT:- [place].AT, No sclera icterus Neck-Supple Neck,No JVD,.  Lungs-  CTAB , good air movement bilaterally  CV- S1, S2 normal, regular Abd-  +ve B.Sounds, Abd Soft, No tenderness,    Extremity/Skin:- No  edema,   good pulses Psych-affect is appropriate, oriented x3 Neuro-generalized weakness, unsteady gait however no new focal deficits, no tremors    Data Review   CBC w Diff:  Lab Results  Component Value Date   WBC 6.9 09/11/2018   HGB 11.7 (L) 09/11/2018   HCT 36.3 09/11/2018   PLT 188 09/11/2018   LYMPHOPCT 37 09/11/2018   MONOPCT 9 09/11/2018   EOSPCT 1 09/11/2018   BASOPCT 1 09/11/2018    CMP:  Lab Results  Component Value Date   NA 138 09/11/2018   NA 141 10/19/2016   K 3.8 09/11/2018   CL 105 09/11/2018   CO2 23 09/11/2018   BUN 11 09/11/2018   BUN 15 10/19/2016   CREATININE 0.79 09/11/2018   CREATININE 0.88 04/24/2017   PROT 6.4 (L) 09/10/2018   PROT 6.3 10/19/2016   ALBUMIN 3.6 09/10/2018   ALBUMIN 4.0 10/19/2016   BILITOT 0.5 09/10/2018   BILITOT 0.3 10/19/2016   ALKPHOS 56 09/10/2018   AST 23 09/10/2018   ALT 14 09/10/2018  . Total Discharge time is about 33 minutes  Shon Hale M.D on 09/11/2018 at 12:43 PM  Go to [email or web address] -  for contact info  [facility] - Office  [phone]

## 2018-09-11 NOTE — Evaluation (Signed)
Physical Therapy Evaluation Patient Details Name: [name] B Rylander MRN: [id number] DOB: 05-12-1929 Today's Date: 09/11/2018   History of Present Illness  Pt is an 85 y.o. female admitted 09/10/18 after mechanical fall at home hitting face. CT maxillofacial showed bilateral anterior nasal bone fracture with right-ward placement. Other imaging negative for acute abnormality. Pt also with UTI. PMH includes CHF, HTN, PVD, stroke, CAD, Hep-A, lower back pain.    Clinical Impression  Pt presents with an overall decrease in functional mobility secondary to above. PTA, pt reports mod indep with SPC, lives alone, denies falls besides this one in past year. Today, pt ambulatory with RW and min guard; pt with decreased safety awareness and at high risk for falls. Pt in agreement she is not safe to return home alone and could benefit from SNF-level therapies if family not available to provide 24/7 support. Will follow acutely to address established goals.    Follow Up Recommendations SNF;Supervision/Assistance - 24 hour    Equipment Recommendations  None recommended by PT    Recommendations for Other Services       Precautions / Restrictions Precautions Precautions: Fall Restrictions Weight Bearing Restrictions: No      Mobility  Bed Mobility               General bed mobility comments: Received sitting in recliner  Transfers Overall transfer level: Needs assistance Equipment used: Rolling walker (2 wheeled) Transfers: Sit to/from Stand Sit to Stand: Supervision;Min guard         General transfer comment: Stood from recliner and BSC (over toilet) with RW and min guard; repeated cues for correct hand placement each trial to reduce fall risk, little carryover of education between trials  Ambulation/Gait Ambulation/Gait assistance: Min guard Gait Distance (Feet): 60 Feet Assistive device: Rolling walker (2 wheeled) Gait Pattern/deviations: Step-through pattern;Decreased stride  length;Trunk flexed Gait velocity: Decreased Gait velocity interpretation: <1.8 ft/sec, indicate of risk for recurrent falls General Gait Details: Slow, mostly steady gait with RW although pt frequently leaving RW when approaching counter, chair, etc. despite cues for safety; min guard for safety  Stairs            Wheelchair Mobility    Modified Rankin (Stroke Patients Only)       Balance Overall balance assessment: Needs assistance   Sitting balance-Leahy Scale: Good       Standing balance-Leahy Scale: Fair Standing balance comment: Can static stand without UE support; dynamic stability improved with UE support                             Pertinent Vitals/Pain Pain Assessment: Faces Faces Pain Scale: Hurts little more Pain Location: Face/nose, bilateral knees Pain Descriptors / Indicators: Sore Pain Intervention(s): Limited activity within patient's tolerance    Home Living Family/patient expects to be discharged to:: Private residence Living Arrangements: Alone Available Help at Discharge: Family;Available PRN/intermittently Type of Home: House Home Access: Ramped entrance     Home Layout: Two level;Able to live on main level with bedroom/bathroom Home Equipment: Dan HumphreysWalker - 2 wheels;Walker - 4 wheels;Cane - single point;Shower seat;Grab bars - tub/shower Additional Comments: Niece is only local relative who has kids, pt unsure if she would be able to stay with her    Prior Function Level of Independence: Independent with assistive device(s)         Comments: Ambulatory with intermittent use of SPC. Reports this is first fall in a few months;  last fall was >1 year ago with rib fxs     Hand Dominance        Extremity/Trunk Assessment   Upper Extremity Assessment Upper Extremity Assessment: Generalized weakness    Lower Extremity Assessment Lower Extremity Assessment: Generalized weakness    Cervical / Trunk Assessment Cervical /  Trunk Assessment: Kyphotic  Communication   Communication: No difficulties  Cognition Arousal/Alertness: Awake/alert Behavior During Therapy: WFL for tasks assessed/performed Overall Cognitive Status: Impaired/Different from baseline Area of Impairment: Attention;Memory;Following commands;Safety/judgement;Awareness;Problem solving                   Current Attention Level: Selective Memory: Decreased short-term memory Following Commands: Follows multi-step commands inconsistently Safety/Judgement: Decreased awareness of safety;Decreased awareness of deficits Awareness: Emergent Problem Solving: Requires verbal cues General Comments: Poor insight into safety, especially with RW when it comes to fall risk reduction. Potentially baseline cognition      General Comments      Exercises     Assessment/Plan    PT Assessment Patient needs continued PT services  PT Problem List Decreased strength;Decreased range of motion;Decreased activity tolerance;Decreased balance;Decreased mobility;Decreased cognition;Decreased knowledge of use of DME;Decreased safety awareness;Pain       PT Treatment Interventions DME instruction;Gait training;Stair training;Functional mobility training;Therapeutic activities;Therapeutic exercise;Balance training;Patient/family education    PT Goals (Current goals can be found in the Care Plan section)  Acute Rehab PT Goals Patient Stated Goal: Agrees not safe to return home; hopeful to stay with niece instead of SNF PT Goal Formulation: With patient Time For Goal Achievement: 09/25/18 Potential to Achieve Goals: Good    Frequency Min 3X/week   Barriers to discharge Decreased caregiver support      Co-evaluation               AM-PAC PT "6 Clicks" Mobility  Outcome Measure Help needed turning from your back to your side while in a flat bed without using bedrails?: None Help needed moving from lying on your back to sitting on the side of a  flat bed without using bedrails?: None Help needed moving to and from a bed to a chair (including a wheelchair)?: A Little Help needed standing up from a chair using your arms (e.g., wheelchair or bedside chair)?: A Little Help needed to walk in hospital room?: A Little Help needed climbing 3-5 steps with a railing? : A Lot 6 Click Score: 19    End of Session Equipment Utilized During Treatment: Gait belt Activity Tolerance: Patient tolerated treatment well Patient left: in chair;with call bell/phone within reach;with chair alarm set Nurse Communication: Mobility status PT Visit Diagnosis: Other abnormalities of gait and mobility (R26.89)    Time: 0454-0981 PT Time Calculation (min) (ACUTE ONLY): 26 min   Charges:   PT Evaluation $PT Eval Moderate Complexity: 1 Mod PT Treatments $Gait Training: 8-22 mins      Ina Homes, PT, DPT Acute Rehabilitation Services  Pager [phone] Office [phone]  Malachy Chamber 09/11/2018, 9:46 AM

## 2018-09-11 NOTE — Discharge Instructions (Signed)
Out of bed with assistance--- Fall Precautions Low-salt diet advised Possible UTI--- take cephalexin 250 mg 3 times a day for 4 days as prescribed, urine culture is pending at this time

## 2018-09-11 NOTE — TOC Transition Note (Addendum)
Transition of Care [facility]) - CM/SW Discharge Note   Patient Details  Name: [name] MRN: [id number] Date of Birth: 08-Feb-1929  Transition of Care [facility]) CM/SW Contact:  Gildardo Griffes, LCSW Phone Number: 09/11/2018, 1:30 PM   Clinical Narrative:     Patient will DC to: [facility] Anticipated DC date: 09/11/2018 Family notified: Claris Che Transport by: Sharin Mons  Per MD patient ready for DC to [facility] . RN, patient, patient's family, and facility notified of DC. Discharge Summary sent to facility. RN given number for report [phone] Room 3222. DC packet on chart. Ambulance transport requested for patient.  CSW signing off.  [place], [place] [phone]    Final next level of care: Skilled Nursing Facility Barriers to Discharge: No Barriers Identified   Patient Goals and CMS Choice Patient states their goals for this hospitalization and ongoing recovery are:: to go to [facility] then home CMS Medicare.gov Compare Post Acute Care list provided to:: Patient Represenative (must comment)(Niece Claris Che) Choice offered to / list presented to : (Niece)  Discharge Placement PASRR number recieved: 09/11/18            Patient chooses bed at: [facility] Patient to be transferred to facility by: [facility] Name of family member notified: Claris Che Patient and family notified of of transfer: 09/11/18  Discharge Plan and Services In-house Referral: NA Discharge Planning Services: NA Post Acute Care Choice: Skilled Nursing Facility          DME Arranged: N/A DME Agency: NA HH Arranged: NA HH Agency: NA   Social Determinants of Health (SDOH) Interventions     Readmission Risk Interventions No flowsheet data found.

## 2018-09-11 NOTE — Progress Notes (Signed)
Report called and given to RN for 3200. No further questions. PIV removed. Ready for pickup with [facility].

## 2018-09-11 NOTE — TOC Initial Note (Signed)
Transition of Care [facility]) - Initial/Assessment Note    Patient Details  Name: [name] MRN: [id number] Date of Birth: 1928/10/20  Transition of Care [facility]) CM/SW Contact:    Gildardo Griffes, LCSW Phone Number: 09/11/2018, 11:19 AM  Clinical Narrative:                  CSW consulted with patient's Niece Claris Che who reports patient lives alone and she is also concerned regarding patient going home and falling. CSW notified Claris Che of PT recommendations of patient going to Skilled Nursing Facility at discharge for short term rehab. She reports she is in agreement and states patient has been to [facility] last spring and that would be preference. Claris Che gave CSW permission to fax out referral to [facility]. CSW will keep family updated on discharge plan and bed offers.   Expected Discharge Plan: Skilled Nursing Facility Barriers to Discharge: No Barriers Identified   Patient Goals and CMS Choice Patient states their goals for this hospitalization and ongoing recovery are:: to go to [facility] then home CMS Medicare.gov Compare Post Acute Care list provided to:: Patient Represenative (must comment)(patient's niece, Claris Che) Choice offered to / list presented to : (Niece)  Expected Discharge Plan and Services Expected Discharge Plan: Skilled Nursing Facility In-house Referral: NA Discharge Planning Services: NA Post Acute Care Choice: Skilled Nursing Facility Living arrangements for the past 2 months: Single Family Home                 DME Arranged: N/A DME Agency: NA HH Arranged: NA HH Agency: NA  Prior Living Arrangements/Services Living arrangements for the past 2 months: Single Family Home Lives with:: Self Patient language and need for interpreter reviewed:: Yes Do you feel safe going back to the place where you live?: No   recommended short term rehab before going home, patient from home alone  Need for Family Participation in Patient Care: Yes (Comment) Care giver  support system in place?: Yes (comment)   Criminal Activity/Legal Involvement Pertinent to Current Situation/Hospitalization: No - Comment as needed  Activities of Daily Living Home Assistive Devices/Equipment: Cane (specify quad or straight) ADL Screening (condition at time of admission) Patient's cognitive ability adequate to safely complete daily activities?: Yes Is the patient deaf or have difficulty hearing?: No Does the patient have difficulty seeing, even when wearing glasses/contacts?: No Does the patient have difficulty concentrating, remembering, or making decisions?: No Patient able to express need for assistance with ADLs?: Yes Does the patient have difficulty dressing or bathing?: No Independently performs ADLs?: Yes (appropriate for developmental age) Does the patient have difficulty walking or climbing stairs?: Yes Weakness of Legs: Both Weakness of Arms/Hands: None  Permission Sought/Granted Permission sought to share information with : Case Manager, Magazine features editor, Family Supports Permission granted to share information with : Yes, Verbal Permission Granted  Share Information with NAME: Claris Che  Permission granted to share info w AGENCY: SNFs  Permission granted to share info w Relationship: Niece  Permission granted to share info w Contact Information: [phone]  Emotional Assessment Appearance:: Appears stated age Attitude/Demeanor/Rapport: Unable to Assess Affect (typically observed): Unable to Assess Orientation: : Oriented to Self, Oriented to  Time, Oriented to Place, Oriented to Situation(fluctuates according to Niece) Alcohol / Substance Use: Not Applicable Psych Involvement: No (comment)  Admission diagnosis:  Closed fracture of nasal bone, initial encounter [S02.2XXA] Fall, initial encounter [id number]] Urinary tract infection with hematuria, site unspecified [N39.0, R31.9] Diarrhea, unspecified type [R19.7] Patient Active Problem  List   Diagnosis Date Noted  . Fall at home, initial encounter 09/11/2018  . Generalized weakness 09/11/2018  . UTI (urinary tract infection) 09/10/2018  . Tachycardia   . PAF (paroxysmal atrial fibrillation) (HCC)   . Malnutrition of moderate degree 09/06/2017  . Multiple rib fractures 09/04/2017  . Dyspnea 01/21/2014  . Chronic diastolic CHF (congestive heart failure) (HCC) 12/15/2013  . Cerebral vascular disease 10/13/2013  . Mixed incontinence urge and stress 04/22/2013  . Nocturia 04/22/2013  . Angina decubitus (HCC) 03/12/2013  . GERD (gastroesophageal reflux disease) 08/16/2011  . Other general symptoms  08/16/2011  . Fatigue 09/04/2010  . HEPATITIS, ACUTE 07/12/2009  . AMI 07/12/2009  . ESOPHAGEAL REFLUX 07/12/2009  . DIVERTICULOSIS, COLON 07/12/2009  . CHEST PAIN 07/12/2009  . SLEEP APNEA 10/23/2007  . Essential hypertension 09/18/2007  . CAD (coronary artery disease) 09/18/2007  . CHEST PAIN, ATYPICAL 09/18/2007   PCP:  Donita BrooksPickard, Warren T, MD Pharmacy:   [facility] [address] - [place], [place] [id number] [facility] [address] [place]  [place] [id number] Phone: [phone] Fax: [phone]     Social Determinants of Health (SDOH) Interventions    Readmission Risk Interventions No flowsheet data found.

## 2018-09-11 NOTE — NC FL2 (Signed)
[place] MEDICAID FL2 LEVEL OF CARE SCREENING TOOL     IDENTIFICATION  Patient Name: [name] Birthdate: [birth year] Sex: female Admission Date (Current Location): 09/10/2018  [place] [facility]   Facility and Address:  [facility], [address], [place], [place] [address]      Provider Number: [id number]  Attending Physician Name and Address:  Shon HaleEmokpae, Courage, MD  Relative Name and Phone Number:  Mertha BaarsMargaret Raines, niece, [phone]    Current Level of Care: Hospital Recommended Level of Care: Skilled Nursing Facility Prior Approval Number:    Date Approved/Denied:   PASRR Number: [id number]  Discharge Plan: SNF    Current Diagnoses: Patient Active Problem List   Diagnosis Date Noted  . Fall at home, initial encounter 09/11/2018  . Generalized weakness 09/11/2018  . UTI (urinary tract infection) 09/10/2018  . Tachycardia   . PAF (paroxysmal atrial fibrillation) (HCC)   . Malnutrition of moderate degree 09/06/2017  . Multiple rib fractures 09/04/2017  . Dyspnea 01/21/2014  . Chronic diastolic CHF (congestive heart failure) (HCC) 12/15/2013  . Cerebral vascular disease 10/13/2013  . Mixed incontinence urge and stress 04/22/2013  . Nocturia 04/22/2013  . Angina decubitus (HCC) 03/12/2013  . GERD (gastroesophageal reflux disease) 08/16/2011  . Other general symptoms  08/16/2011  . Fatigue 09/04/2010  . HEPATITIS, ACUTE 07/12/2009  . AMI 07/12/2009  . ESOPHAGEAL REFLUX 07/12/2009  . DIVERTICULOSIS, COLON 07/12/2009  . CHEST PAIN 07/12/2009  . SLEEP APNEA 10/23/2007  . Essential hypertension 09/18/2007  . CAD (coronary artery disease) 09/18/2007  . CHEST PAIN, ATYPICAL 09/18/2007    Orientation RESPIRATION BLADDER Height & Weight     Self, Time, Situation, Place  Normal Continent, External catheter Weight:   Height:     BEHAVIORAL SYMPTOMS/MOOD NEUROLOGICAL BOWEL NUTRITION STATUS      Incontinent  Diet(regular diet; thin liquids)  AMBULATORY STATUS COMMUNICATION OF NEEDS Skin   Limited Assist Verbally Skin abrasions(abrasion on forehead and nose)                       Personal Care Assistance Level of Assistance  Bathing, Feeding, Dressing Bathing Assistance: Limited assistance Feeding assistance: Independent Dressing Assistance: Limited assistance     Functional Limitations Info  Sight, Hearing, Speech Sight Info: Adequate Hearing Info: Adequate Speech Info: Adequate    SPECIAL CARE FACTORS FREQUENCY  PT (By licensed PT), OT (By licensed OT)     PT Frequency: 5x week OT Frequency: 5x week            Contractures Contractures Info: Not present    Additional Factors Info  Code Status, Allergies Code Status Info: Full Code Allergies Info: METOPROLOL, PHENERGAN PROMETHAZINE HCL, PROCHLORPERAZINE EDISYLATE, ZETIA EZETIMIBE, COMPAZINE PROCHLORPERAZINE, SULFUR, NIACIN, SULFONAMIDE DERIVATIVES            Current Medications (09/11/2018):  This is the current hospital active medication list Current Facility-Administered Medications  Medication Dose Route Frequency Provider Last Rate Last Dose  . 0.9 %  sodium chloride infusion   Intravenous Continuous Howerter, Justin B, DO 75 mL/hr at 09/11/18 0143    . acetaminophen (TYLENOL) tablet 650 mg  650 mg Oral Q6H PRN Howerter, Justin B, DO       Or  . acetaminophen (TYLENOL) suppository 650 mg  650 mg Rectal Q6H PRN Howerter, Justin B, DO      . aspirin EC tablet 81 mg  81 mg Oral Daily Howerter, Justin B,  DO      . cefTRIAXone (ROCEPHIN) 1 g in sodium chloride 0.9 % 100 mL IVPB  1 g Intravenous Q24H Howerter, Justin B, DO      . isosorbide mononitrate (IMDUR) 24 hr tablet 60 mg  60 mg Oral Daily Howerter, Justin B, DO      . nebivolol (BYSTOLIC) tablet 10 mg  10 mg Oral Daily Howerter, Justin B, DO      . pantoprazole (PROTONIX) EC tablet 40 mg  40 mg Oral Daily Howerter, Justin B, DO      . rosuvastatin (CRESTOR)  tablet 20 mg  20 mg Oral q1800 Howerter, Justin B, DO         Discharge Medications: Please see discharge summary for a list of discharge medications.  Relevant Imaging Results:  Relevant Lab Results:   Additional Information SSN [id number] [place], [place]

## 2018-09-11 NOTE — Plan of Care (Signed)

## 2018-09-12 LAB — URINE CULTURE: Culture: >=100000 — AB

## 2018-09-16 LAB — CULTURE, BLOOD (ROUTINE X 2)
Culture: NO GROWTH
Culture: NO GROWTH

## 2018-10-21 ENCOUNTER — Telehealth: Payer: Self-pay | Admitting: Family Medicine

## 2018-10-21 NOTE — Telephone Encounter (Signed)
Received call from [facility] - OT for [facility] requesting a beside commode for pt as she has a hard time getting up from the seated position from her toilet. Orders and LOV, LOV from hosp and demo faxed to [employer].

## 2018-10-27 ENCOUNTER — Telehealth: Payer: Self-pay | Admitting: Family Medicine

## 2018-10-27 NOTE — Telephone Encounter (Signed)
Mallory calling to get VO discharge on pt from [facility].

## 2018-10-27 NOTE — Telephone Encounter (Signed)
[name] called and VO given

## 2018-10-31 ENCOUNTER — Telehealth: Payer: Self-pay

## 2018-10-31 NOTE — Telephone Encounter (Signed)
Error [facility] patient.

## 2018-11-04 ENCOUNTER — Other Ambulatory Visit: Payer: Self-pay | Admitting: Cardiovascular Disease

## 2018-11-04 MED ORDER — ISOSORBIDE MONONITRATE ER 60 MG PO TB24: 60.0000 mg | ORAL_TABLET | Freq: Every day | ORAL | 0 refills | Status: DC

## 2018-11-17 ENCOUNTER — Telehealth: Payer: Self-pay | Admitting: Family Medicine

## 2018-11-17 NOTE — Telephone Encounter (Signed)
[name] called, [name] with [facility] at [facility], and states that the [name] today was c/o CP throughout the night and some this am. Her BP was 112/64 and [name] was stable with no pain while he was there. I called and spoke to [name] and she states that she had some CP throughout the night and some in the am until about 10 and has not had any since then. It is now 250pm. She is having some SOB but no worse then normal, no arm pain and no indigestion. Recommend that if CP stars to worsen to call EMS and appt made to tomorrow.

## 2018-11-18 ENCOUNTER — Emergency Department (HOSPITAL_COMMUNITY): Payer: Medicare Other

## 2018-11-18 ENCOUNTER — Ambulatory Visit: Payer: Medicare Other | Admitting: Family Medicine

## 2018-11-18 ENCOUNTER — Encounter (HOSPITAL_COMMUNITY): Payer: Self-pay | Admitting: Emergency Medicine

## 2018-11-18 ENCOUNTER — Other Ambulatory Visit: Payer: Self-pay

## 2018-11-18 ENCOUNTER — Inpatient Hospital Stay (HOSPITAL_COMMUNITY)
Admission: EM | Admit: 2018-11-18 | Discharge: 2018-11-21 | DRG: 480 | Disposition: A | Discharge status: Skilled Nursing Facility | Payer: Medicare Other | Attending: Internal Medicine | Admitting: Internal Medicine

## 2018-11-18 DIAGNOSIS — Z8261 Family history of arthritis: Secondary | ICD-10-CM

## 2018-11-18 DIAGNOSIS — I10 Essential (primary) hypertension: Secondary | ICD-10-CM | POA: Diagnosis not present

## 2018-11-18 DIAGNOSIS — Z833 Family history of diabetes mellitus: Secondary | ICD-10-CM

## 2018-11-18 DIAGNOSIS — I110 Hypertensive heart disease with heart failure: Secondary | ICD-10-CM | POA: Diagnosis present

## 2018-11-18 DIAGNOSIS — S72009A Fracture of unspecified part of neck of unspecified femur, initial encounter for closed fracture: Secondary | ICD-10-CM | POA: Diagnosis present

## 2018-11-18 DIAGNOSIS — I739 Peripheral vascular disease, unspecified: Secondary | ICD-10-CM | POA: Diagnosis present

## 2018-11-18 DIAGNOSIS — K219 Gastro-esophageal reflux disease without esophagitis: Secondary | ICD-10-CM | POA: Diagnosis present

## 2018-11-18 DIAGNOSIS — I5042 Chronic combined systolic (congestive) and diastolic (congestive) heart failure: Secondary | ICD-10-CM | POA: Diagnosis present

## 2018-11-18 DIAGNOSIS — Y92009 Unspecified place in unspecified non-institutional (private) residence as the place of occurrence of the external cause: Secondary | ICD-10-CM

## 2018-11-18 DIAGNOSIS — Z882 Allergy status to sulfonamides status: Secondary | ICD-10-CM | POA: Diagnosis not present

## 2018-11-18 DIAGNOSIS — G9341 Metabolic encephalopathy: Secondary | ICD-10-CM | POA: Diagnosis not present

## 2018-11-18 DIAGNOSIS — G4730 Sleep apnea, unspecified: Secondary | ICD-10-CM | POA: Diagnosis present

## 2018-11-18 DIAGNOSIS — E785 Hyperlipidemia, unspecified: Secondary | ICD-10-CM | POA: Diagnosis present

## 2018-11-18 DIAGNOSIS — Z79899 Other long term (current) drug therapy: Secondary | ICD-10-CM

## 2018-11-18 DIAGNOSIS — Z1159 Encounter for screening for other viral diseases: Secondary | ICD-10-CM

## 2018-11-18 DIAGNOSIS — W1830XA Fall on same level, unspecified, initial encounter: Secondary | ICD-10-CM | POA: Diagnosis present

## 2018-11-18 DIAGNOSIS — I480 Paroxysmal atrial fibrillation: Secondary | ICD-10-CM | POA: Diagnosis not present

## 2018-11-18 DIAGNOSIS — Z66 Do not resuscitate: Secondary | ICD-10-CM | POA: Diagnosis present

## 2018-11-18 DIAGNOSIS — S72141A Displaced intertrochanteric fracture of right femur, initial encounter for closed fracture: Secondary | ICD-10-CM | POA: Diagnosis present

## 2018-11-18 DIAGNOSIS — G92 Toxic encephalopathy: Secondary | ICD-10-CM | POA: Diagnosis not present

## 2018-11-18 DIAGNOSIS — G8929 Other chronic pain: Secondary | ICD-10-CM | POA: Diagnosis present

## 2018-11-18 DIAGNOSIS — Z823 Family history of stroke: Secondary | ICD-10-CM

## 2018-11-18 DIAGNOSIS — I5043 Acute on chronic combined systolic (congestive) and diastolic (congestive) heart failure: Secondary | ICD-10-CM | POA: Diagnosis not present

## 2018-11-18 DIAGNOSIS — I2510 Atherosclerotic heart disease of native coronary artery without angina pectoris: Secondary | ICD-10-CM | POA: Diagnosis present

## 2018-11-18 DIAGNOSIS — Z888 Allergy status to other drugs, medicaments and biological substances status: Secondary | ICD-10-CM

## 2018-11-18 DIAGNOSIS — F0390 Unspecified dementia without behavioral disturbance: Secondary | ICD-10-CM | POA: Diagnosis present

## 2018-11-18 DIAGNOSIS — T148XXA Other injury of unspecified body region, initial encounter: Secondary | ICD-10-CM

## 2018-11-18 DIAGNOSIS — S72001D Fracture of unspecified part of neck of right femur, subsequent encounter for closed fracture with routine healing: Secondary | ICD-10-CM | POA: Diagnosis not present

## 2018-11-18 DIAGNOSIS — Z951 Presence of aortocoronary bypass graft: Secondary | ICD-10-CM

## 2018-11-18 DIAGNOSIS — M79602 Pain in left arm: Secondary | ICD-10-CM | POA: Diagnosis not present

## 2018-11-18 DIAGNOSIS — J45909 Unspecified asthma, uncomplicated: Secondary | ICD-10-CM | POA: Diagnosis present

## 2018-11-18 DIAGNOSIS — T402X5A Adverse effect of other opioids, initial encounter: Secondary | ICD-10-CM | POA: Diagnosis not present

## 2018-11-18 DIAGNOSIS — F329 Major depressive disorder, single episode, unspecified: Secondary | ICD-10-CM | POA: Diagnosis present

## 2018-11-18 DIAGNOSIS — Z8673 Personal history of transient ischemic attack (TIA), and cerebral infarction without residual deficits: Secondary | ICD-10-CM

## 2018-11-18 DIAGNOSIS — T796XXD Traumatic ischemia of muscle, subsequent encounter: Secondary | ICD-10-CM | POA: Diagnosis not present

## 2018-11-18 DIAGNOSIS — M549 Dorsalgia, unspecified: Secondary | ICD-10-CM | POA: Diagnosis present

## 2018-11-18 DIAGNOSIS — Z7982 Long term (current) use of aspirin: Secondary | ICD-10-CM | POA: Diagnosis not present

## 2018-11-18 DIAGNOSIS — Z8249 Family history of ischemic heart disease and other diseases of the circulatory system: Secondary | ICD-10-CM

## 2018-11-18 DIAGNOSIS — E782 Mixed hyperlipidemia: Secondary | ICD-10-CM | POA: Diagnosis not present

## 2018-11-18 DIAGNOSIS — M6282 Rhabdomyolysis: Secondary | ICD-10-CM | POA: Diagnosis present

## 2018-11-18 LAB — CBC WITH DIFFERENTIAL/PLATELET
Abs Immature Granulocytes: 0.1 10*3/uL — ABNORMAL HIGH (ref 0.00–0.07)
Basophils Absolute: 0 10*3/uL (ref 0.0–0.1)
Basophils Relative: 0 %
Eosinophils Absolute: 0 10*3/uL (ref 0.0–0.5)
Eosinophils Relative: 0 %
HCT: 39.4 % (ref 36.0–46.0)
Hemoglobin: 13.1 g/dL (ref 12.0–15.0)
Immature Granulocytes: 1 %
Lymphocytes Relative: 7 %
Lymphs Abs: 1.3 10*3/uL (ref 0.7–4.0)
MCH: 30.9 pg (ref 26.0–34.0)
MCHC: 33.2 g/dL (ref 30.0–36.0)
MCV: 92.9 fL (ref 80.0–100.0)
Monocytes Absolute: 1.3 10*3/uL — ABNORMAL HIGH (ref 0.1–1.0)
Monocytes Relative: 7 %
Neutro Abs: 16.2 10*3/uL — ABNORMAL HIGH (ref 1.7–7.7)
Neutrophils Relative %: 85 %
Platelets: 169 10*3/uL (ref 150–400)
RBC: 4.24 MIL/uL (ref 3.87–5.11)
RDW: 12.6 % (ref 11.5–15.5)
WBC: 18.9 10*3/uL — ABNORMAL HIGH (ref 4.0–10.5)
nRBC: 0 % (ref 0.0–0.2)

## 2018-11-18 LAB — COMPREHENSIVE METABOLIC PANEL
ALT: 23 U/L (ref 0–44)
AST: 48 U/L — ABNORMAL HIGH (ref 15–41)
Albumin: 3.8 g/dL (ref 3.5–5.0)
Alkaline Phosphatase: 60 U/L (ref 38–126)
Anion gap: 12 (ref 5–15)
BUN: 22 mg/dL (ref 8–23)
CO2: 23 mmol/L (ref 22–32)
Calcium: 9.1 mg/dL (ref 8.9–10.3)
Chloride: 103 mmol/L (ref 98–111)
Creatinine, Ser: 0.88 mg/dL (ref 0.44–1.00)
GFR calc Af Amer: >60 mL/min (ref >60)
GFR calc non Af Amer: 59 mL/min — ABNORMAL LOW (ref >60)
Glucose, Bld: 144 mg/dL — ABNORMAL HIGH (ref 70–99)
Potassium: 4.2 mmol/L (ref 3.5–5.1)
Sodium: 138 mmol/L (ref 135–145)
Total Bilirubin: 0.9 mg/dL (ref 0.3–1.2)
Total Protein: 6.7 g/dL (ref 6.5–8.1)

## 2018-11-18 LAB — CBC
HCT: 37.6 % (ref 36.0–46.0)
Hemoglobin: 12.8 g/dL (ref 12.0–15.0)
MCH: 30.9 pg (ref 26.0–34.0)
MCHC: 34 g/dL (ref 30.0–36.0)
MCV: 90.8 fL (ref 80.0–100.0)
Platelets: 155 10*3/uL (ref 150–400)
RBC: 4.14 MIL/uL (ref 3.87–5.11)
RDW: 12.5 % (ref 11.5–15.5)
WBC: 17.7 10*3/uL — ABNORMAL HIGH (ref 4.0–10.5)
nRBC: 0 % (ref 0.0–0.2)

## 2018-11-18 LAB — CREATININE, SERUM
Creatinine, Ser: 1.1 mg/dL — ABNORMAL HIGH (ref 0.44–1.00)
GFR calc Af Amer: 52 mL/min — ABNORMAL LOW (ref >60)
GFR calc non Af Amer: 45 mL/min — ABNORMAL LOW (ref >60)

## 2018-11-18 LAB — PROTIME-INR
INR: 1.1 (ref 0.8–1.2)
Prothrombin Time: 14.5 seconds (ref 11.4–15.2)

## 2018-11-18 LAB — SARS CORONAVIRUS 2 BY RT PCR (HOSPITAL ORDER, PERFORMED IN ~~LOC~~ HOSPITAL LAB): SARS Coronavirus 2: NEGATIVE

## 2018-11-18 LAB — CK: Total CK: 1070 U/L — ABNORMAL HIGH (ref 38–234)

## 2018-11-18 MED ORDER — POLYETHYLENE GLYCOL 3350 17 G PO PACK
17.0000 g | PACK | Freq: Every day | ORAL | Status: DC
  Administered 2018-11-18 – 2018-11-21 (×3): 17 g via ORAL
  Filled 2018-11-18 (×2): qty 1

## 2018-11-18 MED ORDER — NEBIVOLOL HCL 10 MG PO TABS
10.0000 mg | ORAL_TABLET | Freq: Every day | ORAL | Status: DC
  Administered 2018-11-19 – 2018-11-21 (×3): 10 mg via ORAL
  Filled 2018-11-18 (×4): qty 1

## 2018-11-18 MED ORDER — POTASSIUM CHLORIDE CRYS ER 10 MEQ PO TBCR
10.0000 meq | EXTENDED_RELEASE_TABLET | Freq: Every day | ORAL | Status: DC
  Administered 2018-11-20 – 2018-11-21 (×2): 10 meq via ORAL
  Filled 2018-11-18 (×2): qty 1

## 2018-11-18 MED ORDER — ISOSORBIDE MONONITRATE ER 60 MG PO TB24
60.0000 mg | ORAL_TABLET | Freq: Every day | ORAL | Status: DC
  Administered 2018-11-18 – 2018-11-21 (×3): 60 mg via ORAL
  Filled 2018-11-18 (×3): qty 1

## 2018-11-18 MED ORDER — LACTATED RINGERS IV SOLN: INTRAVENOUS | Status: AC

## 2018-11-18 MED ORDER — OXYCODONE HCL 5 MG PO TABS: 2.5000 mg | ORAL_TABLET | ORAL | Status: DC | PRN

## 2018-11-18 MED ORDER — ACETAMINOPHEN 500 MG PO TABS
1000.0000 mg | ORAL_TABLET | Freq: Three times a day (TID) | ORAL | Status: DC
  Administered 2018-11-18 – 2018-11-21 (×7): 1000 mg via ORAL
  Filled 2018-11-18 (×8): qty 2

## 2018-11-18 MED ORDER — OXYCODONE HCL 5 MG PO TABS
5.0000 mg | ORAL_TABLET | ORAL | Status: DC | PRN
  Administered 2018-11-19 – 2018-11-20 (×4): 5 mg via ORAL
  Filled 2018-11-18 (×5): qty 1

## 2018-11-18 MED ORDER — MORPHINE SULFATE (PF) 4 MG/ML IV SOLN
4.0000 mg | Freq: Once | INTRAVENOUS | Status: AC
  Administered 2018-11-18: 4 mg via INTRAVENOUS
  Filled 2018-11-18: qty 1

## 2018-11-18 MED ORDER — ONDANSETRON HCL 4 MG/2ML IJ SOLN
4.0000 mg | Freq: Once | INTRAMUSCULAR | Status: AC
  Administered 2018-11-18: 4 mg via INTRAVENOUS
  Filled 2018-11-18: qty 2

## 2018-11-18 MED ORDER — ONDANSETRON HCL 4 MG PO TABS: 4.0000 mg | ORAL_TABLET | Freq: Four times a day (QID) | ORAL | Status: DC | PRN

## 2018-11-18 MED ORDER — PANTOPRAZOLE SODIUM 40 MG PO TBEC
40.0000 mg | DELAYED_RELEASE_TABLET | Freq: Every day | ORAL | Status: DC
  Administered 2018-11-18 – 2018-11-21 (×3): 40 mg via ORAL
  Filled 2018-11-18 (×2): qty 1

## 2018-11-18 MED ORDER — SODIUM CHLORIDE 0.9 % IV BOLUS
500.0000 mL | Freq: Once | INTRAVENOUS | Status: AC
  Administered 2018-11-18: 500 mL via INTRAVENOUS

## 2018-11-18 MED ORDER — MORPHINE SULFATE (PF) 2 MG/ML IV SOLN
1.0000 mg | INTRAVENOUS | Status: DC | PRN
  Administered 2018-11-19: 1 mg via INTRAVENOUS
  Filled 2018-11-18 (×2): qty 1

## 2018-11-18 MED ORDER — ROSUVASTATIN CALCIUM 20 MG PO TABS
20.0000 mg | ORAL_TABLET | Freq: Every day | ORAL | Status: DC
  Administered 2018-11-18 – 2018-11-21 (×3): 20 mg via ORAL
  Filled 2018-11-18 (×3): qty 1

## 2018-11-18 MED ORDER — ONDANSETRON HCL 4 MG/2ML IJ SOLN: 4.0000 mg | Freq: Four times a day (QID) | INTRAMUSCULAR | Status: DC | PRN

## 2018-11-18 NOTE — ED Notes (Signed)
Bed: WA03 Expected date:  Expected time:  Means of arrival:  Comments: EMS 85yo hip pain, fall, hit head

## 2018-11-18 NOTE — ED Notes (Signed)
ED TO INPATIENT HANDOFF REPORT  Name/Age/Gender [name] 85 y.o. female  Code Status Code Status History    Date Active Date Inactive Code Status Order ID Comments User Context   09/11/2018 0049 09/11/2018 1823 DNR [id number]  Angie Fava, DO Inpatient   09/04/2017 0310 09/10/2017 1926 Full Code [id number]  Abigail Miyamoto, MD ED   Advance Care Planning Activity    Questions for Most Recent Historical Code Status (Order [id number])    Question Answer Comment   In the event of cardiac or respiratory ARREST Do not call a "code blue"    In the event of cardiac or respiratory ARREST Do not perform Intubation, CPR, defibrillation or ACLS    In the event of cardiac or respiratory ARREST Use medication by any route, position, wound care, and other measures to relive pain and suffering. May use oxygen, suction and manual treatment of airway obstruction as needed for comfort.       Home/SNF/Other Home  Chief Complaint Fall  Level of Care/Admitting Diagnosis ED Disposition    ED Disposition Condition Comment   Admit  Hospital Area: [facility] [[id number]]  Level of Care: Telemetry Medical [104]  Covid Evaluation: Screening Protocol (No Symptoms)  Diagnosis: Hip fracture [facility]) [[id number]]  Admitting Physician: Zigmund Daniel [id number]  Attending Physician: Shaune Spittle, A CALDWELL [phone]  Estimated length of stay: past midnight tomorrow  Certification:: I certify this patient will need inpatient services for at least 2 midnights  PT Class (Do Not Modify): Inpatient [101]  PT Acc Code (Do Not Modify): Private [1]       Medical History Past Medical History:  Diagnosis Date  . Allergy   . Asthma   . CAD (coronary artery disease)    s/p CABG x 5 in 1997 & CABG x 3 in 2010; Last cath in April of 2010 showed grafts to be patent. EF is 40 to 45%  . CHF (congestive heart failure) (HCC)    EF is 40 to 45%  . Depression    following bypass surgery  .  Diverticulosis   . Endometriosis   . GERD (gastroesophageal reflux disease)   . Hepatitis A   . Hiatal hernia   . History of cerebrovascular disease   . History of vertebral fracture    at T[facility]  . Hyperlipemia   . Hypertension   . LBP (low back pain)   . Mixed incontinence urge and stress 04/22/2013  . Nocturia 04/22/2013  . Pleural effusion    post surgical  . PVD (peripheral vascular disease) (HCC)    S/P right CEA  . Sleep apnea   . Stroke [facility]) 2010    Allergies Allergies  Allergen Reactions  . Metoprolol Other (See Comments)    allopoecia  . Phenergan [Promethazine Hcl] Other (See Comments)    Facial numbness  . Prochlorperazine Edisylate Anaphylaxis  . Zetia [Ezetimibe] Nausea Only and Other (See Comments)    Abdominal pain  . Compazine [Prochlorperazine]     Facial  Paralysis       . Sulfur Other (See Comments)    paralyzes  face   . Niacin Rash  . Sulfonamide Derivatives Nausea And Vomiting    IV Location/Drains/Wounds Patient Lines/Drains/Airways Status   Active Line/Drains/Airways    Name:   Placement date:   Placement time:   Site:   Days:   Peripheral IV 11/18/18 Left Wrist   11/18/18    -    Wrist  less than 1          Labs/Imaging Results for orders placed or performed during the hospital encounter of 11/18/18 (from the past 48 hour(s))  Comprehensive metabolic panel     Status: Abnormal   Collection Time: 11/18/18  2:15 PM  Result Value Ref Range   Sodium 138 135 - 145 mmol/L   Potassium 4.2 3.5 - 5.1 mmol/L   Chloride 103 98 - 111 mmol/L   CO2 23 22 - 32 mmol/L   Glucose, Bld 144 (H) 70 - 99 mg/dL   BUN 22 8 - 23 mg/dL   Creatinine, Ser 4.09 0.44 - 1.00 mg/dL   Calcium 9.1 8.9 - 81.1 mg/dL   Total Protein 6.7 6.5 - 8.1 g/dL   Albumin 3.8 3.5 - 5.0 g/dL   AST 48 (H) 15 - 41 U/L   ALT 23 0 - 44 U/L   Alkaline Phosphatase 60 38 - 126 U/L   Total Bilirubin 0.9 0.3 - 1.2 mg/dL   GFR calc non Af Amer 59 (L) >60 mL/min   GFR calc Af  Amer >60 >60 mL/min   Anion gap 12 5 - 15    Comment: Performed at [facility], [address], [place], [place] [address]  CBC with Differential     Status: Abnormal   Collection Time: 11/18/18  2:15 PM  Result Value Ref Range   WBC 18.9 (H) 4.0 - 10.5 K/uL   RBC 4.24 3.87 - 5.11 MIL/uL   Hemoglobin 13.1 12.0 - 15.0 g/dL   HCT 29.5 62.1 - 30.8 %   MCV 92.9 80.0 - 100.0 fL   MCH 30.9 26.0 - 34.0 pg   MCHC 33.2 30.0 - 36.0 g/dL   RDW 65.7 84.6 - 96.2 %   Platelets 169 150 - 400 K/uL   nRBC 0.0 0.0 - 0.2 %   Neutrophils Relative % 85 %   Neutro Abs 16.2 (H) 1.7 - 7.7 K/uL   Lymphocytes Relative 7 %   Lymphs Abs 1.3 0.7 - 4.0 K/uL   Monocytes Relative 7 %   Monocytes Absolute 1.3 (H) 0.1 - 1.0 K/uL   Eosinophils Relative 0 %   Eosinophils Absolute 0.0 0.0 - 0.5 K/uL   Basophils Relative 0 %   Basophils Absolute 0.0 0.0 - 0.1 K/uL   Immature Granulocytes 1 %   Abs Immature Granulocytes 0.10 (H) 0.00 - 0.07 K/uL    Comment: Performed at [facility], [address] [place], [place] [address]  Protime-INR     Status: None   Collection Time: 11/18/18  2:15 PM  Result Value Ref Range   Prothrombin Time 14.5 11.4 - 15.2 seconds   INR 1.1 0.8 - 1.2    Comment: (NOTE) INR goal varies based on device and disease states. Performed at [facility], [address], [place], [place] [address]   CK     Status: Abnormal   Collection Time: 11/18/18  2:15 PM  Result Value Ref Range   Total CK 1,070 (H) 38 - 234 U/L    Comment: Performed at [facility], [address] [place], [place] [address]  SARS Coronavirus 2 (CEPHEID - Performed in [facility] hospital lab), Hosp Order     Status: None   Collection Time: 11/18/18  3:13 PM   Specimen: Nasopharyngeal Swab  Result Value Ref Range   SARS Coronavirus 2 NEGATIVE NEGATIVE    Comment: (NOTE) If result is  NEGATIVE SARS-CoV-2 target nucleic acids are NOT  DETECTED. The SARS-CoV-2 RNA is generally detectable in upper and lower  respiratory specimens during the acute phase of infection. The lowest  concentration of SARS-CoV-2 viral copies this assay can detect is 250  copies / mL. A negative result does not preclude SARS-CoV-2 infection  and should not be used as the sole basis for treatment or other  patient management decisions.  A negative result may occur with  improper specimen collection / handling, submission of specimen other  than nasopharyngeal swab, presence of viral mutation(s) within the  areas targeted by this assay, and inadequate number of viral copies  (<250 copies / mL). A negative result must be combined with clinical  observations, patient history, and epidemiological information. If result is POSITIVE SARS-CoV-2 target nucleic acids are DETECTED. The SARS-CoV-2 RNA is generally detectable in upper and lower  respiratory specimens dur ing the acute phase of infection.  Positive  results are indicative of active infection with SARS-CoV-2.  Clinical  correlation with patient history and other diagnostic information is  necessary to determine patient infection status.  Positive results do  not rule out bacterial infection or co-infection with other viruses. If result is PRESUMPTIVE POSTIVE SARS-CoV-2 nucleic acids MAY BE PRESENT.   A presumptive positive result was obtained on the submitted specimen  and confirmed on repeat testing.  While 2019 novel coronavirus  (SARS-CoV-2) nucleic acids may be present in the submitted sample  additional confirmatory testing may be necessary for epidemiological  and / or clinical management purposes  to differentiate between  SARS-CoV-2 and other Sarbecovirus currently known to infect humans.  If clinically indicated additional testing with an alternate test  methodology [phone]([id number]) is advised. The SARS-CoV-2 RNA is generally  detectable in upper and lower respiratory sp ecimens during  the acute  phase of infection. The expected result is Negative. Fact Sheet for Patients:  BoilerBrush.com.cyhttps://www.fda.gov/media/136312/download Fact Sheet for Healthcare Providers: https://pope.com/https://www.fda.gov/media/136313/download This test is not yet approved or cleared by the [place] FDA and has been authorized for detection and/or diagnosis of SARS-CoV-2 by FDA under an Emergency Use Authorization (EUA).  This EUA will remain in effect (meaning this test can be used) for the duration of the COVID-19 declaration under Section 564(b)(1) of the Act, 21 U.S.C. section 360bbb-3(b)(1), unless the authorization is terminated or revoked sooner. Performed at [facility], [address] [place], [place] [id number]    Dg Ribs Unilateral W/chest Right  Result Date: 11/18/2018 CLINICAL DATA:  Fall.  Pain. EXAM: RIGHT RIBS AND CHEST - 3+ VIEW COMPARISON:  09/10/2018. FINDINGS: Mediastinum hilar structures normal. Prior CABG. Heart size normal. Lungs are clear. No pleural effusion or pneumothorax. Prior midthoracic vertebroplasty. Old left posterior seventh rib fracture. No acute bony abnormality. IMPRESSION: 1.  Prior CABG.  No acute cardiopulmonary disease. 2.  No acute bony abnormality.  No pneumothorax. Electronically Signed   By: Maisie Fushomas  Register   On: 11/18/2018 14:03   Dg Elbow Complete Right  Result Date: 11/18/2018 CLINICAL DATA:  Larey SeatFell.  Right elbow pain. EXAM: RIGHT ELBOW - COMPLETE 3+ VIEW COMPARISON:  None. FINDINGS: Mild degenerative changes but no acute fracture or IMPRESSION: Degenerative changes but no acute bony findings or joint effusion. Electronically Signed   By: Rudie MeyerP.  Gallerani M.D.   On: 11/18/2018 14:46   Dg Forearm Right  Result Date: 11/18/2018 CLINICAL DATA:  Larey SeatFell.  Right forearm pain. EXAM: RIGHT FOREARM - 2 VIEW COMPARISON:  None. FINDINGS: The wrist  and elbow joints are maintained. No acute forearm fractures are identified. IMPRESSION: No acute bony findings.  Electronically Signed   By: Rudie MeyerP.  Gallerani M.D.   On: 11/18/2018 14:47   Ct Head Wo Contrast  Result Date: 11/18/2018 CLINICAL DATA:  Fall. Right eye swelling. EXAM: CT HEAD WITHOUT CONTRAST CT MAXILLOFACIAL WITHOUT CONTRAST CT CERVICAL SPINE WITHOUT CONTRAST TECHNIQUE: Multidetector CT imaging of the head, cervical spine, and maxillofacial structures were performed using the standard protocol without intravenous contrast. Multiplanar CT image reconstructions of the cervical spine and maxillofacial structures were also generated. COMPARISON:  For 15 20 FINDINGS: CT HEAD FINDINGS Brain: There is no evidence of acute infarct, intracranial hemorrhage, mass, midline shift, or extra-axial fluid collection. Cerebral white matter hypodensities are nonspecific but compatible with mild for age chronic small vessel ischemic disease. Mild cerebral atrophy is unchanged. Vascular: Calcified atherosclerosis at the skull base. No hyperdense vessel. Skull: No fracture or suspicious osseous lesion. Other: None. CT MAXILLOFACIAL FINDINGS Osseous: No acute fracture or destructive osseous process. Decreased displacement/angulation of nasal bone fractures which were seen on the prior study. Orbits: Bilateral cataract extraction. Moderate right periorbital soft tissue swelling extending laterally and superiorly into the scalp. Sinuses: Trace right maxillary sinus fluid. Minimal bilateral ethmoid sinus mucosal thickening. Clear mastoid air cells. Soft tissues: No additional findings. CT CERVICAL SPINE FINDINGS The study is mildly to moderately motion degraded. Alignment: Trace anterolisthesis of C7 on T1. Skull base and vertebrae: No acute fracture identified within limitations of motion artifact. No destructive osseous lesion. Soft tissues and spinal canal: No evidence of significant prevertebral swelling or visible spinal canal hematoma. Disc levels: Mild disc and moderate facet degeneration in the cervical spine. No high-grade  osseous neural foraminal or spinal canal stenosis. Upper chest: Mild biapical pleuroparenchymal lung scarring. Other: Unchanged 1.7 cm hypoattenuating left thyroid nodule. Calcified atherosclerosis at the carotid bifurcations, extensive on the right. Mild superficial swelling in the soft tissues of the right lateral neck. IMPRESSION: 1. No evidence of acute intracranial abnormality. 2. Right periorbital, scalp, and neck soft tissue swelling. 3. No acute maxillofacial fracture. 4. No acute cervical spine fracture identified within limitations of motion artifact. Electronically Signed   By: Sebastian AcheAllen  Grady M.D.   On: 11/18/2018 14:26   Ct Cervical Spine Wo Contrast  Result Date: 11/18/2018 CLINICAL DATA:  Fall. Right eye swelling. EXAM: CT HEAD WITHOUT CONTRAST CT MAXILLOFACIAL WITHOUT CONTRAST CT CERVICAL SPINE WITHOUT CONTRAST TECHNIQUE: Multidetector CT imaging of the head, cervical spine, and maxillofacial structures were performed using the standard protocol without intravenous contrast. Multiplanar CT image reconstructions of the cervical spine and maxillofacial structures were also generated. COMPARISON:  For 15 20 FINDINGS: CT HEAD FINDINGS Brain: There is no evidence of acute infarct, intracranial hemorrhage, mass, midline shift, or extra-axial fluid collection. Cerebral white matter hypodensities are nonspecific but compatible with mild for age chronic small vessel ischemic disease. Mild cerebral atrophy is unchanged. Vascular: Calcified atherosclerosis at the skull base. No hyperdense vessel. Skull: No fracture or suspicious osseous lesion. Other: None. CT MAXILLOFACIAL FINDINGS Osseous: No acute fracture or destructive osseous process. Decreased displacement/angulation of nasal bone fractures which were seen on the prior study. Orbits: Bilateral cataract extraction. Moderate right periorbital soft tissue swelling extending laterally and superiorly into the scalp. Sinuses: Trace right maxillary sinus  fluid. Minimal bilateral ethmoid sinus mucosal thickening. Clear mastoid air cells. Soft tissues: No additional findings. CT CERVICAL SPINE FINDINGS The study is mildly to moderately motion degraded. Alignment: Trace anterolisthesis of C7 on  T1. Skull base and vertebrae: No acute fracture identified within limitations of motion artifact. No destructive osseous lesion. Soft tissues and spinal canal: No evidence of significant prevertebral swelling or visible spinal canal hematoma. Disc levels: Mild disc and moderate facet degeneration in the cervical spine. No high-grade osseous neural foraminal or spinal canal stenosis. Upper chest: Mild biapical pleuroparenchymal lung scarring. Other: Unchanged 1.7 cm hypoattenuating left thyroid nodule. Calcified atherosclerosis at the carotid bifurcations, extensive on the right. Mild superficial swelling in the soft tissues of the right lateral neck. IMPRESSION: 1. No evidence of acute intracranial abnormality. 2. Right periorbital, scalp, and neck soft tissue swelling. 3. No acute maxillofacial fracture. 4. No acute cervical spine fracture identified within limitations of motion artifact. Electronically Signed   By: Sebastian AcheAllen  Grady M.D.   On: 11/18/2018 14:26   Dg Humerus Right  Result Date: 11/18/2018 CLINICAL DATA:  Larey SeatFell today.  Right arm pain. EXAM: RIGHT HUMERUS - 2+ VIEW COMPARISON:  None. FINDINGS: AC joint and glenohumeral joint degenerative changes are noted. The elbow joint is maintained. No acute fracture of the humerus is identified. Moderate soft tissue calcifications are noted around the right shoulder consistent with calcific tendinitis. IMPRESSION: No acute fracture. Electronically Signed   By: Rudie MeyerP.  Gallerani M.D.   On: 11/18/2018 14:48   Dg Hip Unilat With Pelvis 2-3 Views Right  Result Date: 11/18/2018 CLINICAL DATA:  Larey SeatFell.  Right hip pain. EXAM: DG HIP (WITH OR WITHOUT PELVIS) 2-3V RIGHT COMPARISON:  None. FINDINGS: Displaced intertrochanteric fracture of  the right hip is noted. Both hips are normally located. Moderate to advanced degenerative changes. The bony pelvis appears intact. IMPRESSION: Displaced intertrochanteric fracture of the right hip. Electronically Signed   By: Rudie MeyerP.  Gallerani M.D.   On: 11/18/2018 14:51   Dg Femur Min 2 Views Right  Result Date: 11/18/2018 CLINICAL DATA:  Larey SeatFell.  Right leg pain. EXAM: RIGHT FEMUR 2 VIEWS COMPARISON:  None. FINDINGS: Displaced intertrochanteric fracture of the right hip is noted. No femoral shaft fracture. Moderate degenerative changes and chondrocalcinosis noted at the knee. Extensive vascular calcifications. IMPRESSION: Right hip fracture but no femoral shaft fracture. Electronically Signed   By: Rudie MeyerP.  Gallerani M.D.   On: 11/18/2018 14:51   Ct Maxillofacial Wo Cm  Result Date: 11/18/2018 CLINICAL DATA:  Fall. Right eye swelling. EXAM: CT HEAD WITHOUT CONTRAST CT MAXILLOFACIAL WITHOUT CONTRAST CT CERVICAL SPINE WITHOUT CONTRAST TECHNIQUE: Multidetector CT imaging of the head, cervical spine, and maxillofacial structures were performed using the standard protocol without intravenous contrast. Multiplanar CT image reconstructions of the cervical spine and maxillofacial structures were also generated. COMPARISON:  For 15 20 FINDINGS: CT HEAD FINDINGS Brain: There is no evidence of acute infarct, intracranial hemorrhage, mass, midline shift, or extra-axial fluid collection. Cerebral white matter hypodensities are nonspecific but compatible with mild for age chronic small vessel ischemic disease. Mild cerebral atrophy is unchanged. Vascular: Calcified atherosclerosis at the skull base. No hyperdense vessel. Skull: No fracture or suspicious osseous lesion. Other: None. CT MAXILLOFACIAL FINDINGS Osseous: No acute fracture or destructive osseous process. Decreased displacement/angulation of nasal bone fractures which were seen on the prior study. Orbits: Bilateral cataract extraction. Moderate right periorbital soft  tissue swelling extending laterally and superiorly into the scalp. Sinuses: Trace right maxillary sinus fluid. Minimal bilateral ethmoid sinus mucosal thickening. Clear mastoid air cells. Soft tissues: No additional findings. CT CERVICAL SPINE FINDINGS The study is mildly to moderately motion degraded. Alignment: Trace anterolisthesis of C7 on T1. Skull base and vertebrae:  No acute fracture identified within limitations of motion artifact. No destructive osseous lesion. Soft tissues and spinal canal: No evidence of significant prevertebral swelling or visible spinal canal hematoma. Disc levels: Mild disc and moderate facet degeneration in the cervical spine. No high-grade osseous neural foraminal or spinal canal stenosis. Upper chest: Mild biapical pleuroparenchymal lung scarring. Other: Unchanged 1.7 cm hypoattenuating left thyroid nodule. Calcified atherosclerosis at the carotid bifurcations, extensive on the right. Mild superficial swelling in the soft tissues of the right lateral neck. IMPRESSION: 1. No evidence of acute intracranial abnormality. 2. Right periorbital, scalp, and neck soft tissue swelling. 3. No acute maxillofacial fracture. 4. No acute cervical spine fracture identified within limitations of motion artifact. Electronically Signed   By: Logan Bores M.D.   On: 11/18/2018 14:26    Pending Labs Unresulted Labs (From admission, onward)    Start     Ordered   11/18/18 1242  Urinalysis, Routine w reflex microscopic  ONCE - STAT,   STAT     11/18/18 1242   Signed and Held  CBC  (heparin)  Once,   R    Comments: Baseline for heparin therapy IF NOT ALREADY DRAWN.  Notify MD if PLT < 100 K.    Signed and Held   Signed and Held  Creatinine, serum  (heparin)  Once,   R    Comments: Baseline for heparin therapy IF NOT ALREADY DRAWN.    Signed and Held   Signed and Held  Comprehensive metabolic panel  Tomorrow morning,   R     Signed and Held   Signed and Held  CBC  Tomorrow morning,   R      Signed and Held   Signed and Held  Protime-INR  Tomorrow morning,   R     Signed and Held   Signed and Held  APTT  Tomorrow morning,   R     Signed and Held          Vitals/Pain Today's Vitals   11/18/18 1630 11/18/18 1700 11/18/18 1800 11/18/18 1815  BP: 127/61 (!) 121/94 122/81   Pulse: 83 85 82 79  Resp: 19  16   Temp:      TempSrc:      SpO2: 94% 93% 96% 93%  PainSc:        Isolation Precautions No active isolations  Medications Medications  pantoprazole (PROTONIX) EC tablet 40 mg (has no administration in time range)  isosorbide mononitrate (IMDUR) 24 hr tablet 60 mg (has no administration in time range)  nebivolol (BYSTOLIC) tablet 10 mg (has no administration in time range)  potassium chloride (K-DUR) CR tablet 10 mEq (has no administration in time range)  rosuvastatin (CRESTOR) tablet 20 mg (has no administration in time range)  oxyCODONE (Oxy IR/ROXICODONE) immediate release tablet 2.5 mg (has no administration in time range)    Or  oxyCODONE (Oxy IR/ROXICODONE) immediate release tablet 5 mg (has no administration in time range)  morphine 2 MG/ML injection 1 mg (has no administration in time range)  lactated ringers infusion (has no administration in time range)  sodium chloride 0.9 % bolus 500 mL (0 mLs Intravenous Stopped 11/18/18 1626)  morphine 4 MG/ML injection 4 mg (4 mg Intravenous Given 11/18/18 1405)  ondansetron (ZOFRAN) injection 4 mg (4 mg Intravenous Given 11/18/18 1421)    Mobility walks

## 2018-11-18 NOTE — ED Notes (Signed)
Patient transported to CT 

## 2018-11-18 NOTE — H&P (Addendum)
History and Physical    [name] DasLou B Peddy QMV:[id number] DOB: 05-Jan-1929 DOA: 11/18/2018  PCP: Donita BrooksPickard, Warren T, MD  Patient coming from: home  I have personally briefly reviewed patient's [facility]  Chief Complaint: fall, hip fracture  HPI: [name] is Zackaria Burkey 85 y.o. female with medical history significant of CAD s/p CABG, heart failure, CVA, HTN, HLD, depression, PVD s/p CEA and multiple other medical problems presenting after mechanical fall with R hip fracture.  History is limited as pt recently received morphine.  Hx is obtained from pt, medical record, and niece.  Pt notes she fell last night when walking to bathroom to get water.  She notes her cane slipped from under her.  She did hit her head, but no LOC.  Hit R side.  She notes that she was on the ground all night until family found her.  No LH or dizziness, no HA or CP.  She hit her R wrist as well.  Per discussion with her niece, [name], she was found down in the den and her story did not quite match where she was found in the morning.  Some ? About the story.  Per nursing, pt was Braeleigh Pyper bit more confused when I spoke with her, but was Albaro Deviney&Ox4 before receiving morphine.  Denies smoking and etoh.  Pt lives alone.  Her niece visits her 3x weekly and family and friends bring food.  She gets around with Shaye Lagace cane/walker, but often doesn't use assistive device in the home.   ED Course: Labs, EKG, imaging.  Admit to hospitalist for hip fracture.  [facility].  Review of Systems: Limited due to pt encephalopathy in setting of recent morphine  Past Medical History:  Diagnosis Date   Allergy    Asthma    CAD (coronary artery disease)    s/p CABG x 5 in 1997 & CABG x 3 in 2010; Last cath in April of 2010 showed grafts to be patent. EF is 40 to 45%   CHF (congestive heart failure) (HCC)    EF is 40 to 45%   Depression    following bypass surgery   Diverticulosis    Endometriosis    GERD  (gastroesophageal reflux disease)    Hepatitis Kasumi Ditullio    Hiatal hernia    History of cerebrovascular disease    History of vertebral fracture    at T8   Hyperlipemia    Hypertension    LBP (low back pain)    Mixed incontinence urge and stress 04/22/2013   Nocturia 04/22/2013   Pleural effusion    post surgical   PVD (peripheral vascular disease) (HCC)    S/P right CEA   Sleep apnea    Stroke [facility](HCC) 2010    Past Surgical History:  Procedure Laterality Date   APPENDECTOMY     CARDIAC CATHETERIZATION  April 2010   Grafts were patent. EF is 40 to 45%   CAROTID ENDARTERECTOMY     right   CORONARY ARTERY BYPASS GRAFT  [id number]   Original surgery in 1997 x 5; Redo CABG x 3 in 2010 including free RIMA to distal LAD, SVG to OM and SVG to left posterolateral   KNEE SURGERY     left   LEFT HEART CATHETERIZATION WITH CORONARY/GRAFT ANGIOGRAM N/Mehmet Scally 03/12/2013   Procedure: LEFT HEART CATHETERIZATION WITH Isabel CapriceORONARY/GRAFT ANGIOGRAM;  Surgeon: Peter M SwazilandJordan, MD;  [facility];  Service: Cardiovascular;  Laterality: N/Albirda Shiel;  PARTIAL COLECTOMY     due to colon abcess     reports that she has never smoked. She has never used smokeless tobacco. She reports that she does not drink alcohol or use drugs.  Allergies  Allergen Reactions   Metoprolol Other (See Comments)    allopoecia   Phenergan [Promethazine Hcl] Other (See Comments)    Facial numbness   Prochlorperazine Edisylate Anaphylaxis   Zetia [Ezetimibe] Nausea Only and Other (See Comments)    Abdominal pain   Compazine [Prochlorperazine]     Facial  Paralysis        Sulfur Other (See Comments)    paralyzes  face    Niacin Rash   Sulfonamide Derivatives Nausea And Vomiting    Family History  Problem Relation Age of Onset   Heart attack Mother    Diabetes Mother    Heart disease Mother    Rheum arthritis Mother    Heart attack Father    Allergies Father    Diabetes Sister     Stroke Sister    Heart disease Sister    Dementia Brother    Healthy Sister    Other Brother        LUNG ISSUES   Other Brother        LUNG ISSUES   Other Brother        LUNG ISSUES   Diabetes Maternal Grandmother    Diabetes Paternal Grandmother    Colon cancer Neg Hx    Prior to Admission medications   Medication Sig Start Date End Date Taking? Authorizing Provider  acetaminophen (TYLENOL) 325 MG tablet Take 2 tablets (650 mg total) by mouth every 6 (six) hours as needed for mild pain, fever or headache (or Fever >/= 101). 09/11/18  Yes Emokpae, Courage, MD  esomeprazole (NEXIUM) 20 MG capsule Take 20 mg by mouth daily at 12 noon.    Yes [provider]  isosorbide mononitrate (IMDUR) 60 MG 24 hr tablet Take 1 tablet (60 mg total) by mouth daily. Please make yearly appt with Dr. Elease HashimotoNahser before anymore refills. 1st attempt 11/04/18  Yes Nahser, Deloris PingPhilip J, MD  nebivolol (BYSTOLIC) 10 MG tablet Take 1 tablet (10 mg total) by mouth daily. Hold for SBP < 110 mmhg and HR < 60 BPM 09/12/18  Yes Emokpae, Courage, MD  potassium chloride (MICRO-K) 10 MEQ CR capsule Take 1 capsule (10 mEq total) by mouth daily. 09/11/18  Yes Emokpae, Courage, MD  rosuvastatin (CRESTOR) 20 MG tablet Take 1 tablet (20 mg total) by mouth daily. 11/26/17  Yes Nahser, Deloris PingPhilip J, MD  aspirin EC 81 MG tablet Take 1 tablet (81 mg total) by mouth daily with breakfast. Patient not taking: Reported on 11/18/2018 09/11/18   Shon HaleEmokpae, Courage, MD  nitroGLYCERIN (NITROSTAT) 0.4 MG SL tablet Place 1 tablet (0.4 mg total) under the tongue every 5 (five) minutes as needed for chest pain. 10/26/17   Devoria AlbeKnapp, Iva, MD    Physical Exam: Vitals:   11/18/18 1430 11/18/18 1500 11/18/18 1530 11/18/18 1600  BP: (!) 142/61 (!) 125/54 (!) 130/99 (!) 139/52  Pulse: 80 82 83 81  Resp: 15 17 16 18   Temp:      TempSrc:      SpO2: 98% 91% 94% 93%    Constitutional: NAD, calm, comfortable Vitals:   11/18/18 1430 11/18/18 1500 11/18/18  1530 11/18/18 1600  BP: (!) 142/61 (!) 125/54 (!) 130/99 (!) 139/52  Pulse: 80 82 83 81  Resp: 15 17 16  18  Temp:      TempSrc:      SpO2: 98% 91% 94% 93%   Eyes: PERRL, lids and conjunctivae normal, periorbital swelling and edema ENMT: Mucous membranes are moist. Posterior pharynx clear of any exudate or lesions.Normal dentition.  Neck: normal, supple, no masses, no thyromegaly Respiratory: clear to auscultation bilaterally, no wheezing, no crackles. Cardiovascular: Regular rate and rhythm, no murmurs / rubs / gallops Abdomen: no tenderness, no masses palpated.  Musculoskeletal: RLE mildly shortened  Skin: no rashes, lesions, ulcers. No induration Neurologic: CN 2-12 intact. Sensation intact. Moves all extremities, limited movement of RLE with fx.  Psychiatric: Normal judgment and insight. Alert and oriented x 1.   Labs on Admission: I have personally reviewed following labs and imaging studies  CBC: Recent Labs  Lab 11/18/18 1415  WBC 18.9*  NEUTROABS 16.2*  HGB 13.1  HCT 39.4  MCV 92.9  PLT 169   Basic Metabolic Panel: Recent Labs  Lab 11/18/18 1415  NA 138  K 4.2  CL 103  CO2 23  GLUCOSE 144*  BUN 22  CREATININE 0.88  CALCIUM 9.1   GFR: CrCl cannot be calculated (Unknown ideal weight.). Liver Function Tests: Recent Labs  Lab 11/18/18 1415  AST 48*  ALT 23  ALKPHOS 60  BILITOT 0.9  PROT 6.7  ALBUMIN 3.8   No results for input(s): LIPASE, AMYLASE in the last 168 hours. No results for input(s): AMMONIA in the last 168 hours. Coagulation Profile: Recent Labs  Lab 11/18/18 1415  INR 1.1   Cardiac Enzymes: No results for input(s): CKTOTAL, CKMB, CKMBINDEX, TROPONINI in the last 168 hours. BNP (last 3 results) No results for input(s): PROBNP in the last 8760 hours. HbA1C: No results for input(s): HGBA1C in the last 72 hours. CBG: No results for input(s): GLUCAP in the last 168 hours. Lipid Profile: No results for input(s): CHOL, HDL, LDLCALC,  TRIG, CHOLHDL, LDLDIRECT in the last 72 hours. Thyroid Function Tests: No results for input(s): TSH, T4TOTAL, FREET4, T3FREE, THYROIDAB in the last 72 hours. Anemia Panel: No results for input(s): VITAMINB12, FOLATE, FERRITIN, TIBC, IRON, RETICCTPCT in the last 72 hours. Urine analysis:    Component Value Date/Time   COLORURINE YELLOW 09/10/2018 1940   APPEARANCEUR CLOUDY (Tyronn Golda) 09/10/2018 1940   LABSPEC 1.010 09/10/2018 1940   PHURINE 6.0 09/10/2018 1940   GLUCOSEU NEGATIVE 09/10/2018 1940   HGBUR MODERATE (Charlcie Prisco) 09/10/2018 1940   BILIRUBINUR NEGATIVE 09/10/2018 1940   KETONESUR NEGATIVE 09/10/2018 1940   PROTEINUR NEGATIVE 09/10/2018 1940   UROBILINOGEN 0.2 01/15/2014 2032   NITRITE NEGATIVE 09/10/2018 1940   LEUKOCYTESUR SMALL (Jadyn Barge) 09/10/2018 1940    Radiological Exams on Admission: Dg Ribs Unilateral W/chest Right  Result Date: 11/18/2018 CLINICAL DATA:  Fall.  Pain. EXAM: RIGHT RIBS AND CHEST - 3+ VIEW COMPARISON:  09/10/2018. FINDINGS: Mediastinum hilar structures normal. Prior CABG. Heart size normal. Lungs are clear. No pleural effusion or pneumothorax. Prior midthoracic vertebroplasty. Old left posterior seventh rib fracture. No acute bony abnormality. IMPRESSION: 1.  Prior CABG.  No acute cardiopulmonary disease. 2.  No acute bony abnormality.  No pneumothorax. Electronically Signed   By: Maisie Fus  Register   On: 11/18/2018 14:03   Dg Elbow Complete Right  Result Date: 11/18/2018 CLINICAL DATA:  Larey Seat.  Right elbow pain. EXAM: RIGHT ELBOW - COMPLETE 3+ VIEW COMPARISON:  None. FINDINGS: Mild degenerative changes but no acute fracture or IMPRESSION: Degenerative changes but no acute bony findings or joint effusion. Electronically Signed  By: Marijo Sanes M.D.   On: 11/18/2018 14:46   Dg Forearm Right  Result Date: 11/18/2018 CLINICAL DATA:  [place].  Right forearm pain. EXAM: RIGHT FOREARM - 2 VIEW COMPARISON:  None. FINDINGS: The wrist and elbow joints are maintained. No acute forearm  fractures are identified. IMPRESSION: No acute bony findings. Electronically Signed   By: Marijo Sanes M.D.   On: 11/18/2018 14:47   Ct Head Wo Contrast  Result Date: 11/18/2018 CLINICAL DATA:  Fall. Right eye swelling. EXAM: CT HEAD WITHOUT CONTRAST CT MAXILLOFACIAL WITHOUT CONTRAST CT CERVICAL SPINE WITHOUT CONTRAST TECHNIQUE: Multidetector CT imaging of the head, cervical spine, and maxillofacial structures were performed using the standard protocol without intravenous contrast. Multiplanar CT image reconstructions of the cervical spine and maxillofacial structures were also generated. COMPARISON:  For 15 20 FINDINGS: CT HEAD FINDINGS Brain: There is no evidence of acute infarct, intracranial hemorrhage, mass, midline shift, or extra-axial fluid collection. Cerebral white matter hypodensities are nonspecific but compatible with mild for age chronic small vessel ischemic disease. Mild cerebral atrophy is unchanged. Vascular: Calcified atherosclerosis at the skull base. No hyperdense vessel. Skull: No fracture or suspicious osseous lesion. Other: None. CT MAXILLOFACIAL FINDINGS Osseous: No acute fracture or destructive osseous process. Decreased displacement/angulation of nasal bone fractures which were seen on the prior study. Orbits: Bilateral cataract extraction. Moderate right periorbital soft tissue swelling extending laterally and superiorly into the scalp. Sinuses: Trace right maxillary sinus fluid. Minimal bilateral ethmoid sinus mucosal thickening. Clear mastoid air cells. Soft tissues: No additional findings. CT CERVICAL SPINE FINDINGS The study is mildly to moderately motion degraded. Alignment: Trace anterolisthesis of C7 on T1. Skull base and vertebrae: No acute fracture identified within limitations of motion artifact. No destructive osseous lesion. Soft tissues and spinal canal: No evidence of significant prevertebral swelling or visible spinal canal hematoma. Disc levels: Mild disc and moderate  facet degeneration in the cervical spine. No high-grade osseous neural foraminal or spinal canal stenosis. Upper chest: Mild biapical pleuroparenchymal lung scarring. Other: Unchanged 1.7 cm hypoattenuating left thyroid nodule. Calcified atherosclerosis at the carotid bifurcations, extensive on the right. Mild superficial swelling in the soft tissues of the right lateral neck. IMPRESSION: 1. No evidence of acute intracranial abnormality. 2. Right periorbital, scalp, and neck soft tissue swelling. 3. No acute maxillofacial fracture. 4. No acute cervical spine fracture identified within limitations of motion artifact. Electronically Signed   By: Logan Bores M.D.   On: 11/18/2018 14:26   Ct Cervical Spine Wo Contrast  Result Date: 11/18/2018 CLINICAL DATA:  Fall. Right eye swelling. EXAM: CT HEAD WITHOUT CONTRAST CT MAXILLOFACIAL WITHOUT CONTRAST CT CERVICAL SPINE WITHOUT CONTRAST TECHNIQUE: Multidetector CT imaging of the head, cervical spine, and maxillofacial structures were performed using the standard protocol without intravenous contrast. Multiplanar CT image reconstructions of the cervical spine and maxillofacial structures were also generated. COMPARISON:  For 15 20 FINDINGS: CT HEAD FINDINGS Brain: There is no evidence of acute infarct, intracranial hemorrhage, mass, midline shift, or extra-axial fluid collection. Cerebral white matter hypodensities are nonspecific but compatible with mild for age chronic small vessel ischemic disease. Mild cerebral atrophy is unchanged. Vascular: Calcified atherosclerosis at the skull base. No hyperdense vessel. Skull: No fracture or suspicious osseous lesion. Other: None. CT MAXILLOFACIAL FINDINGS Osseous: No acute fracture or destructive osseous process. Decreased displacement/angulation of nasal bone fractures which were seen on the prior study. Orbits: Bilateral cataract extraction. Moderate right periorbital soft tissue swelling extending laterally and superiorly  into the scalp.  Sinuses: Trace right maxillary sinus fluid. Minimal bilateral ethmoid sinus mucosal thickening. Clear mastoid air cells. Soft tissues: No additional findings. CT CERVICAL SPINE FINDINGS The study is mildly to moderately motion degraded. Alignment: Trace anterolisthesis of C7 on T1. Skull base and vertebrae: No acute fracture identified within limitations of motion artifact. No destructive osseous lesion. Soft tissues and spinal canal: No evidence of significant prevertebral swelling or visible spinal canal hematoma. Disc levels: Mild disc and moderate facet degeneration in the cervical spine. No high-grade osseous neural foraminal or spinal canal stenosis. Upper chest: Mild biapical pleuroparenchymal lung scarring. Other: Unchanged 1.7 cm hypoattenuating left thyroid nodule. Calcified atherosclerosis at the carotid bifurcations, extensive on the right. Mild superficial swelling in the soft tissues of the right lateral neck. IMPRESSION: 1. No evidence of acute intracranial abnormality. 2. Right periorbital, scalp, and neck soft tissue swelling. 3. No acute maxillofacial fracture. 4. No acute cervical spine fracture identified within limitations of motion artifact. Electronically Signed   By: Sebastian Ache M.D.   On: 11/18/2018 14:26   Dg Humerus Right  Result Date: 11/18/2018 CLINICAL DATA:  Larey Seat today.  Right arm pain. EXAM: RIGHT HUMERUS - 2+ VIEW COMPARISON:  None. FINDINGS: AC joint and glenohumeral joint degenerative changes are noted. The elbow joint is maintained. No acute fracture of the humerus is identified. Moderate soft tissue calcifications are noted around the right shoulder consistent with calcific tendinitis. IMPRESSION: No acute fracture. Electronically Signed   By: Rudie Meyer M.D.   On: 11/18/2018 14:48   Dg Hip Unilat With Pelvis 2-3 Views Right  Result Date: 11/18/2018 CLINICAL DATA:  Larey Seat.  Right hip pain. EXAM: DG HIP (WITH OR WITHOUT PELVIS) 2-3V RIGHT COMPARISON:   None. FINDINGS: Displaced intertrochanteric fracture of the right hip is noted. Both hips are normally located. Moderate to advanced degenerative changes. The bony pelvis appears intact. IMPRESSION: Displaced intertrochanteric fracture of the right hip. Electronically Signed   By: Rudie Meyer M.D.   On: 11/18/2018 14:51   Dg Femur Min 2 Views Right  Result Date: 11/18/2018 CLINICAL DATA:  Larey Seat.  Right leg pain. EXAM: RIGHT FEMUR 2 VIEWS COMPARISON:  None. FINDINGS: Displaced intertrochanteric fracture of the right hip is noted. No femoral shaft fracture. Moderate degenerative changes and chondrocalcinosis noted at the knee. Extensive vascular calcifications. IMPRESSION: Right hip fracture but no femoral shaft fracture. Electronically Signed   By: Rudie Meyer M.D.   On: 11/18/2018 14:51   Ct Maxillofacial Wo Cm  Result Date: 11/18/2018 CLINICAL DATA:  Fall. Right eye swelling. EXAM: CT HEAD WITHOUT CONTRAST CT MAXILLOFACIAL WITHOUT CONTRAST CT CERVICAL SPINE WITHOUT CONTRAST TECHNIQUE: Multidetector CT imaging of the head, cervical spine, and maxillofacial structures were performed using the standard protocol without intravenous contrast. Multiplanar CT image reconstructions of the cervical spine and maxillofacial structures were also generated. COMPARISON:  For 15 20 FINDINGS: CT HEAD FINDINGS Brain: There is no evidence of acute infarct, intracranial hemorrhage, mass, midline shift, or extra-axial fluid collection. Cerebral white matter hypodensities are nonspecific but compatible with mild for age chronic small vessel ischemic disease. Mild cerebral atrophy is unchanged. Vascular: Calcified atherosclerosis at the skull base. No hyperdense vessel. Skull: No fracture or suspicious osseous lesion. Other: None. CT MAXILLOFACIAL FINDINGS Osseous: No acute fracture or destructive osseous process. Decreased displacement/angulation of nasal bone fractures which were seen on the prior study. Orbits: Bilateral  cataract extraction. Moderate right periorbital soft tissue swelling extending laterally and superiorly into the scalp. Sinuses: Trace right maxillary sinus  fluid. Minimal bilateral ethmoid sinus mucosal thickening. Clear mastoid air cells. Soft tissues: No additional findings. CT CERVICAL SPINE FINDINGS The study is mildly to moderately motion degraded. Alignment: Trace anterolisthesis of C7 on T1. Skull base and vertebrae: No acute fracture identified within limitations of motion artifact. No destructive osseous lesion. Soft tissues and spinal canal: No evidence of significant prevertebral swelling or visible spinal canal hematoma. Disc levels: Mild disc and moderate facet degeneration in the cervical spine. No high-grade osseous neural foraminal or spinal canal stenosis. Upper chest: Mild biapical pleuroparenchymal lung scarring. Other: Unchanged 1.7 cm hypoattenuating left thyroid nodule. Calcified atherosclerosis at the carotid bifurcations, extensive on the right. Mild superficial swelling in the soft tissues of the right lateral neck. IMPRESSION: 1. No evidence of acute intracranial abnormality. 2. Right periorbital, scalp, and neck soft tissue swelling. 3. No acute maxillofacial fracture. 4. No acute cervical spine fracture identified within limitations of motion artifact. Electronically Signed   By: Sebastian AcheAllen  Grady M.D.   On: 11/18/2018 14:26    EKG: Independently reviewed. Sinus rhythm, LBBB, appears consistent with prior ekg's  Assessment/Plan Active Problems:   Hip fracture (HCC)  Right Hip Fracture   Mechanical Fall: Pt reports cane slipped out from underneath her while walking.  No CP, LH, SOB, LOC.  Imaging with R hip fracture.  Head/maxilloface/neck CT without acute abnormality, rib films negative, plain films of R forearm, humerus, elbow negative.  Ortho consult placed, awaiting call back Discussed with pt and family (niece, [name]), RCRI 3 (hx CAD, HF, stroke), risk of MACE ~15%.  Risk  of surgery is not insignificant, but I think benefit of surgical fixation of hip fracture likely outweighs the risk.  She was living independently prior to this admission for her hip fracture.  No evidence of HF on exam or murmur.  Low risk stress test in 2016.  Echo in 2017 with grade 1 diastolic dysfunction and normal EF.  I do not believe additional cardiac testing would change management at this time. Scheduled APAP, oxy prn, morphine for breakthrough.  Bowel regimen SCD's, post op DVT ppx per ortho NPO after midnight Gentle IVF given hx HF Follow CK given unknown time down  Acute Encephalopathy: 2/2 morphine, will try lower dose.  Delirium precautions.  CAD s/p CABG in 1997 and 2010: pt asx.  Continue bystolic, crestor, imdur.  She reports to me she's not taking ASA anymore, but can't say why.  Hx HFrEF   HFpEF: EF improved on 2017 echo, diastolic dysfunction.  Appears euvolemic.  Follow.  HTN: continue bystolic, imdur  Hx CVA: continue crestor, pt has ASA on med list, but says she hasn't taken it recently  Hx PVD s/p R carotid endarterectomy:   Leukocytosis: likely reactive, afebrile.  Follow.  Pending COVID19 test.  DVT prophylaxis: SCD Code Status: DNR - per review of prior notes in chart as well as discussion with pt niece - pt was notably Lise Pincus bit confused, would discuss again with pt when Palma Buster&o. Family Communication: discussed with niece Disposition Plan: pending Consults called: orthopedics, Dean  Admission status: inpatient    Lacretia Nicksaldwell Powell MD [facility] Pager AMION   If 7PM-7AM, please contact night-coverage [email or web address] Password [email or web address]  11/18/2018, 5:15 PM

## 2018-11-18 NOTE — ED Notes (Signed)
Pt still in CT/XR

## 2018-11-18 NOTE — ED Notes (Signed)
[facility] called 

## 2018-11-18 NOTE — ED Provider Notes (Signed)
[facility]-EMERGENCY DEPT Provider Note   CSN: [id number] Arrival date & time: 11/18/18  1153    History   Chief Complaint Chief Complaint  Patient presents with   Fall    HPI [name] is a 85 y.o. female.     HPI  85 year old female presents with a fall.  She states she was using her cane which normally stabilizes her about 1 minute she was up and walking in the next she was falling over.  She did not feel lightheaded or dizzy.  No preceding headache or chest pain.  She is having multiple areas of right-sided pain after the fall.  She hit her right face/eye.  She is having pain in her right upper arm and elbow in addition to her right lateral chest and right hip/thigh.  Has chronic back pain that is unchanged today.  Given IV fentanyl by EMS.  Pain is currently about 8 out of 10.  Past Medical History:  Diagnosis Date   Allergy    Asthma    CAD (coronary artery disease)    s/p CABG x 5 in 1997 & CABG x 3 in 2010; Last cath in April of 2010 showed grafts to be patent. EF is 40 to 45%   CHF (congestive heart failure) (HCC)    EF is 40 to 45%   Depression    following bypass surgery   Diverticulosis    Endometriosis    GERD (gastroesophageal reflux disease)    Hepatitis A    Hiatal hernia    History of cerebrovascular disease    History of vertebral fracture    at T8   Hyperlipemia    Hypertension    LBP (low back pain)    Mixed incontinence urge and stress 04/22/2013   Nocturia 04/22/2013   Pleural effusion    post surgical   PVD (peripheral vascular disease) (HCC)    S/P right CEA   Sleep apnea    Stroke [facility](HCC) 2010    Patient Active Problem List   Diagnosis Date Noted   Fall at home, initial encounter 09/11/2018   Generalized weakness 09/11/2018   UTI (urinary tract infection) 09/10/2018   Tachycardia    PAF (paroxysmal atrial fibrillation) (HCC)    Malnutrition of moderate degree 09/06/2017   Multiple  rib fractures 09/04/2017   Dyspnea 01/21/2014   Chronic diastolic CHF (congestive heart failure) (HCC) 12/15/2013   Cerebral vascular disease 10/13/2013   Mixed incontinence urge and stress 04/22/2013   Nocturia 04/22/2013   Angina decubitus (HCC) 03/12/2013   GERD (gastroesophageal reflux disease) 08/16/2011   Other general symptoms  08/16/2011   Fatigue 09/04/2010   HEPATITIS, ACUTE 07/12/2009   AMI 07/12/2009   ESOPHAGEAL REFLUX 07/12/2009   DIVERTICULOSIS, COLON 07/12/2009   CHEST PAIN 07/12/2009   SLEEP APNEA 10/23/2007   Essential hypertension 09/18/2007   CAD (coronary artery disease) 09/18/2007   CHEST PAIN, ATYPICAL 09/18/2007    Past Surgical History:  Procedure Laterality Date   APPENDECTOMY     CARDIAC CATHETERIZATION  April 2010   Grafts were patent. EF is 40 to 45%   CAROTID ENDARTERECTOMY     right   CORONARY ARTERY BYPASS GRAFT  [id number],2010   Original surgery in 1997 x 5; Redo CABG x 3 in 2010 including free RIMA to distal LAD, SVG to OM and SVG to left posterolateral   KNEE SURGERY     left   LEFT HEART CATHETERIZATION WITH CORONARY/GRAFT ANGIOGRAM N/A 03/12/2013  Procedure: LEFT HEART CATHETERIZATION WITH Isabel CapriceORONARY/GRAFT ANGIOGRAM;  Surgeon: Peter M SwazilandJordan, MD;  [facility];  Service: Cardiovascular;  Laterality: N/A;   PARTIAL COLECTOMY     due to colon abcess     OB History   No obstetric history on file.      Home Medications    Prior to Admission medications   Medication Sig Start Date End Date Taking? Authorizing Provider  acetaminophen (TYLENOL) 325 MG tablet Take 2 tablets (650 mg total) by mouth every 6 (six) hours as needed for mild pain, fever or headache (or Fever >/= 101). 09/11/18   Shon HaleEmokpae, Courage, MD  aspirin EC 81 MG tablet Take 1 tablet (81 mg total) by mouth daily with breakfast. 09/11/18   Mariea ClontsEmokpae, Courage, MD  esomeprazole (NEXIUM) 20 MG capsule Take 20 mg by mouth daily at 12 noon.     [provider]  isosorbide mononitrate (IMDUR) 60 MG 24 hr tablet Take 1 tablet (60 mg total) by mouth daily. Please make yearly appt with Dr. Elease HashimotoNahser before anymore refills. 1st attempt 11/04/18   Nahser, Deloris PingPhilip J, MD  nebivolol (BYSTOLIC) 10 MG tablet Take 1 tablet (10 mg total) by mouth daily. Hold for SBP < 110 mmhg and HR < 60 BPM 09/12/18   Emokpae, Courage, MD  nitroGLYCERIN (NITROSTAT) 0.4 MG SL tablet Place 1 tablet (0.4 mg total) under the tongue every 5 (five) minutes as needed for chest pain. 10/26/17   Devoria AlbeKnapp, Iva, MD  potassium chloride (MICRO-K) 10 MEQ CR capsule Take 1 capsule (10 mEq total) by mouth daily. 09/11/18   Shon HaleEmokpae, Courage, MD  rosuvastatin (CRESTOR) 20 MG tablet Take 1 tablet (20 mg total) by mouth daily. 11/26/17   Nahser, Deloris PingPhilip J, MD    Family History Family History  Problem Relation Age of Onset   Heart attack Mother    Diabetes Mother    Heart disease Mother    Rheum arthritis Mother    Heart attack Father    Allergies Father    Diabetes Sister    Stroke Sister    Heart disease Sister    Dementia Brother    Healthy Sister    Other Brother        LUNG ISSUES   Other Brother        LUNG ISSUES   Other Brother        LUNG ISSUES   Diabetes Maternal Grandmother    Diabetes Paternal Grandmother    Colon cancer Neg Hx     Social History Social History   Tobacco Use   Smoking status: Never Smoker   Smokeless tobacco: Never Used  Substance Use Topics   Alcohol use: No    Alcohol/week: 0.0 standard drinks   Drug use: No     Allergies   Metoprolol, Phenergan [promethazine hcl], Prochlorperazine edisylate, Zetia [ezetimibe], Compazine [prochlorperazine], Sulfur, Niacin, and Sulfonamide derivatives   Review of Systems Review of Systems  Constitutional: Negative for fever.  HENT: Positive for facial swelling.   Cardiovascular: Positive for chest pain.  Gastrointestinal: Negative for abdominal pain.  Musculoskeletal: Positive for  arthralgias and back pain. Negative for neck pain.  Neurological: Negative for headaches.  All other systems reviewed and are negative.    Physical Exam Updated Vital Signs BP (!) 143/61    Pulse 79    Temp 97.6 F (36.4 C) (Oral)    Resp 18    SpO2 93%   Physical Exam Vitals signs and nursing note  reviewed.  Constitutional:      General: She is not in acute distress.    Appearance: She is well-developed. She is not ill-appearing or diaphoretic.     Interventions: Cervical collar in place.  HENT:     Head: Normocephalic.      Right Ear: External ear normal.     Left Ear: External ear normal.     Nose: Nose normal.  Eyes:     General:        Right eye: No discharge.        Left eye: No discharge.     Extraocular Movements: Extraocular movements intact.     Pupils: Pupils are equal, round, and reactive to light.  Cardiovascular:     Rate and Rhythm: Normal rate and regular rhythm.     Pulses:          Radial pulses are 2+ on the right side.       Dorsalis pedis pulses are 2+ on the right side and 2+ on the left side.     Heart sounds: Normal heart sounds.  Pulmonary:     Effort: Pulmonary effort is normal.     Breath sounds: Normal breath sounds.  Chest:     Chest wall: Tenderness (diffuse right sided chest wall tenderness) present.    Abdominal:     General: There is no distension.     Palpations: Abdomen is soft.     Tenderness: There is no abdominal tenderness.  Musculoskeletal:     Right shoulder: She exhibits decreased range of motion and tenderness.     Right elbow: Tenderness found.     Right hip: She exhibits decreased range of motion and tenderness.     Right upper arm: She exhibits tenderness.     Right forearm: She exhibits no tenderness.       Arms:     Right upper leg: She exhibits tenderness.  Skin:    General: Skin is warm and dry.  Neurological:     Mental Status: She is alert.  Psychiatric:        Mood and Affect: Mood is not anxious.       ED Treatments / Results  Labs (all labs ordered are listed, but only abnormal results are displayed) Labs Reviewed  COMPREHENSIVE METABOLIC PANEL - Abnormal; Notable for the following components:      Result Value   Glucose, Bld 144 (*)    AST 48 (*)    GFR calc non Af Amer 59 (*)    All other components within normal limits  CBC WITH DIFFERENTIAL/PLATELET - Abnormal; Notable for the following components:   WBC 18.9 (*)    Neutro Abs 16.2 (*)    Monocytes Absolute 1.3 (*)    Abs Immature Granulocytes 0.10 (*)    All other components within normal limits  SARS CORONAVIRUS 2 (HOSPITAL ORDER, PERFORMED IN [facility])  PROTIME-INR  URINALYSIS, ROUTINE W REFLEX MICROSCOPIC    EKG EKG Interpretation  Date/Time:  Tuesday November 18 2018 14:08:50 EDT Ventricular Rate:  79 PR Interval:    QRS Duration: 136 QT Interval:  441 QTC Calculation: 506 R Axis:   -78 Text Interpretation:  Sinus rhythm Nonspecific IVCD with LAD LVH with secondary repolarization abnormality Anterior infarct, old no significant change since June 2019 Confirmed by [name] [phone]) on 11/18/2018 2:12:23 PM   Radiology Dg Ribs Unilateral W/chest Right  Result Date: 11/18/2018 CLINICAL DATA:  Fall.  Pain. EXAM: RIGHT  RIBS AND CHEST - 3+ VIEW COMPARISON:  09/10/2018. FINDINGS: Mediastinum hilar structures normal. Prior CABG. Heart size normal. Lungs are clear. No pleural effusion or pneumothorax. Prior midthoracic vertebroplasty. Old left posterior seventh rib fracture. No acute bony abnormality. IMPRESSION: 1.  Prior CABG.  No acute cardiopulmonary disease. 2.  No acute bony abnormality.  No pneumothorax. Electronically Signed   By: Maisie Fus  Register   On: 11/18/2018 14:03   Dg Elbow Complete Right  Result Date: 11/18/2018 CLINICAL DATA:  Larey Seat.  Right elbow pain. EXAM: RIGHT ELBOW - COMPLETE 3+ VIEW COMPARISON:  None. FINDINGS: Mild degenerative changes but no acute fracture or IMPRESSION:  Degenerative changes but no acute bony findings or joint effusion. Electronically Signed   By: Rudie Meyer M.D.   On: 11/18/2018 14:46   Dg Forearm Right  Result Date: 11/18/2018 CLINICAL DATA:  Larey Seat.  Right forearm pain. EXAM: RIGHT FOREARM - 2 VIEW COMPARISON:  None. FINDINGS: The wrist and elbow joints are maintained. No acute forearm fractures are identified. IMPRESSION: No acute bony findings. Electronically Signed   By: Rudie Meyer M.D.   On: 11/18/2018 14:47   Ct Head Wo Contrast  Result Date: 11/18/2018 CLINICAL DATA:  Fall. Right eye swelling. EXAM: CT HEAD WITHOUT CONTRAST CT MAXILLOFACIAL WITHOUT CONTRAST CT CERVICAL SPINE WITHOUT CONTRAST TECHNIQUE: Multidetector CT imaging of the head, cervical spine, and maxillofacial structures were performed using the standard protocol without intravenous contrast. Multiplanar CT image reconstructions of the cervical spine and maxillofacial structures were also generated. COMPARISON:  For 15 20 FINDINGS: CT HEAD FINDINGS Brain: There is no evidence of acute infarct, intracranial hemorrhage, mass, midline shift, or extra-axial fluid collection. Cerebral white matter hypodensities are nonspecific but compatible with mild for age chronic small vessel ischemic disease. Mild cerebral atrophy is unchanged. Vascular: Calcified atherosclerosis at the skull base. No hyperdense vessel. Skull: No fracture or suspicious osseous lesion. Other: None. CT MAXILLOFACIAL FINDINGS Osseous: No acute fracture or destructive osseous process. Decreased displacement/angulation of nasal bone fractures which were seen on the prior study. Orbits: Bilateral cataract extraction. Moderate right periorbital soft tissue swelling extending laterally and superiorly into the scalp. Sinuses: Trace right maxillary sinus fluid. Minimal bilateral ethmoid sinus mucosal thickening. Clear mastoid air cells. Soft tissues: No additional findings. CT CERVICAL SPINE FINDINGS The study is mildly to  moderately motion degraded. Alignment: Trace anterolisthesis of C7 on T1. Skull base and vertebrae: No acute fracture identified within limitations of motion artifact. No destructive osseous lesion. Soft tissues and spinal canal: No evidence of significant prevertebral swelling or visible spinal canal hematoma. Disc levels: Mild disc and moderate facet degeneration in the cervical spine. No high-grade osseous neural foraminal or spinal canal stenosis. Upper chest: Mild biapical pleuroparenchymal lung scarring. Other: Unchanged 1.7 cm hypoattenuating left thyroid nodule. Calcified atherosclerosis at the carotid bifurcations, extensive on the right. Mild superficial swelling in the soft tissues of the right lateral neck. IMPRESSION: 1. No evidence of acute intracranial abnormality. 2. Right periorbital, scalp, and neck soft tissue swelling. 3. No acute maxillofacial fracture. 4. No acute cervical spine fracture identified within limitations of motion artifact. Electronically Signed   By: Sebastian Ache M.D.   On: 11/18/2018 14:26   Ct Cervical Spine Wo Contrast  Result Date: 11/18/2018 CLINICAL DATA:  Fall. Right eye swelling. EXAM: CT HEAD WITHOUT CONTRAST CT MAXILLOFACIAL WITHOUT CONTRAST CT CERVICAL SPINE WITHOUT CONTRAST TECHNIQUE: Multidetector CT imaging of the head, cervical spine, and maxillofacial structures were performed using the standard protocol without intravenous  contrast. Multiplanar CT image reconstructions of the cervical spine and maxillofacial structures were also generated. COMPARISON:  For 15 20 FINDINGS: CT HEAD FINDINGS Brain: There is no evidence of acute infarct, intracranial hemorrhage, mass, midline shift, or extra-axial fluid collection. Cerebral white matter hypodensities are nonspecific but compatible with mild for age chronic small vessel ischemic disease. Mild cerebral atrophy is unchanged. Vascular: Calcified atherosclerosis at the skull base. No hyperdense vessel. Skull: No  fracture or suspicious osseous lesion. Other: None. CT MAXILLOFACIAL FINDINGS Osseous: No acute fracture or destructive osseous process. Decreased displacement/angulation of nasal bone fractures which were seen on the prior study. Orbits: Bilateral cataract extraction. Moderate right periorbital soft tissue swelling extending laterally and superiorly into the scalp. Sinuses: Trace right maxillary sinus fluid. Minimal bilateral ethmoid sinus mucosal thickening. Clear mastoid air cells. Soft tissues: No additional findings. CT CERVICAL SPINE FINDINGS The study is mildly to moderately motion degraded. Alignment: Trace anterolisthesis of C7 on T1. Skull base and vertebrae: No acute fracture identified within limitations of motion artifact. No destructive osseous lesion. Soft tissues and spinal canal: No evidence of significant prevertebral swelling or visible spinal canal hematoma. Disc levels: Mild disc and moderate facet degeneration in the cervical spine. No high-grade osseous neural foraminal or spinal canal stenosis. Upper chest: Mild biapical pleuroparenchymal lung scarring. Other: Unchanged 1.7 cm hypoattenuating left thyroid nodule. Calcified atherosclerosis at the carotid bifurcations, extensive on the right. Mild superficial swelling in the soft tissues of the right lateral neck. IMPRESSION: 1. No evidence of acute intracranial abnormality. 2. Right periorbital, scalp, and neck soft tissue swelling. 3. No acute maxillofacial fracture. 4. No acute cervical spine fracture identified within limitations of motion artifact. Electronically Signed   By: Sebastian AcheAllen  Grady M.D.   On: 11/18/2018 14:26   Dg Humerus Right  Result Date: 11/18/2018 CLINICAL DATA:  Larey SeatFell today.  Right arm pain. EXAM: RIGHT HUMERUS - 2+ VIEW COMPARISON:  None. FINDINGS: AC joint and glenohumeral joint degenerative changes are noted. The elbow joint is maintained. No acute fracture of the humerus is identified. Moderate soft tissue  calcifications are noted around the right shoulder consistent with calcific tendinitis. IMPRESSION: No acute fracture. Electronically Signed   By: Rudie MeyerP.  Gallerani M.D.   On: 11/18/2018 14:48   Dg Hip Unilat With Pelvis 2-3 Views Right  Result Date: 11/18/2018 CLINICAL DATA:  Larey SeatFell.  Right hip pain. EXAM: DG HIP (WITH OR WITHOUT PELVIS) 2-3V RIGHT COMPARISON:  None. FINDINGS: Displaced intertrochanteric fracture of the right hip is noted. Both hips are normally located. Moderate to advanced degenerative changes. The bony pelvis appears intact. IMPRESSION: Displaced intertrochanteric fracture of the right hip. Electronically Signed   By: Rudie MeyerP.  Gallerani M.D.   On: 11/18/2018 14:51   Dg Femur Min 2 Views Right  Result Date: 11/18/2018 CLINICAL DATA:  Larey SeatFell.  Right leg pain. EXAM: RIGHT FEMUR 2 VIEWS COMPARISON:  None. FINDINGS: Displaced intertrochanteric fracture of the right hip is noted. No femoral shaft fracture. Moderate degenerative changes and chondrocalcinosis noted at the knee. Extensive vascular calcifications. IMPRESSION: Right hip fracture but no femoral shaft fracture. Electronically Signed   By: Rudie MeyerP.  Gallerani M.D.   On: 11/18/2018 14:51   Ct Maxillofacial Wo Cm  Result Date: 11/18/2018 CLINICAL DATA:  Fall. Right eye swelling. EXAM: CT HEAD WITHOUT CONTRAST CT MAXILLOFACIAL WITHOUT CONTRAST CT CERVICAL SPINE WITHOUT CONTRAST TECHNIQUE: Multidetector CT imaging of the head, cervical spine, and maxillofacial structures were performed using the standard protocol without intravenous contrast. Multiplanar CT image reconstructions  of the cervical spine and maxillofacial structures were also generated. COMPARISON:  For 15 20 FINDINGS: CT HEAD FINDINGS Brain: There is no evidence of acute infarct, intracranial hemorrhage, mass, midline shift, or extra-axial fluid collection. Cerebral white matter hypodensities are nonspecific but compatible with mild for age chronic small vessel ischemic disease. Mild  cerebral atrophy is unchanged. Vascular: Calcified atherosclerosis at the skull base. No hyperdense vessel. Skull: No fracture or suspicious osseous lesion. Other: None. CT MAXILLOFACIAL FINDINGS Osseous: No acute fracture or destructive osseous process. Decreased displacement/angulation of nasal bone fractures which were seen on the prior study. Orbits: Bilateral cataract extraction. Moderate right periorbital soft tissue swelling extending laterally and superiorly into the scalp. Sinuses: Trace right maxillary sinus fluid. Minimal bilateral ethmoid sinus mucosal thickening. Clear mastoid air cells. Soft tissues: No additional findings. CT CERVICAL SPINE FINDINGS The study is mildly to moderately motion degraded. Alignment: Trace anterolisthesis of C7 on T1. Skull base and vertebrae: No acute fracture identified within limitations of motion artifact. No destructive osseous lesion. Soft tissues and spinal canal: No evidence of significant prevertebral swelling or visible spinal canal hematoma. Disc levels: Mild disc and moderate facet degeneration in the cervical spine. No high-grade osseous neural foraminal or spinal canal stenosis. Upper chest: Mild biapical pleuroparenchymal lung scarring. Other: Unchanged 1.7 cm hypoattenuating left thyroid nodule. Calcified atherosclerosis at the carotid bifurcations, extensive on the right. Mild superficial swelling in the soft tissues of the right lateral neck. IMPRESSION: 1. No evidence of acute intracranial abnormality. 2. Right periorbital, scalp, and neck soft tissue swelling. 3. No acute maxillofacial fracture. 4. No acute cervical spine fracture identified within limitations of motion artifact. Electronically Signed   By: Logan Bores M.D.   On: 11/18/2018 14:26    Procedures Procedures (including critical care time)  Medications Ordered in ED Medications  sodium chloride 0.9 % bolus 500 mL (500 mLs Intravenous New Bag/Given (Non-Interop) 11/18/18 1404)    morphine 4 MG/ML injection 4 mg (4 mg Intravenous Given 11/18/18 1405)  ondansetron (ZOFRAN) injection 4 mg (4 mg Intravenous Given 11/18/18 1421)     Initial Impression / Assessment and Plan / ED Course  I have reviewed the triage vital signs and the nursing notes.  Pertinent labs & imaging results that were available during my care of the patient were reviewed by me and considered in my medical decision making (see chart for details).        Labs and CTs are overall unremarkable save for the intertrochanteric hip fracture.  She is neurovascular intact and has normal strength/sensation in her right foot. Hospitalist will admit. I discussed with OR nurse for Dr. Marlou Sa to make him aware of patient.  Final Clinical Impressions(s) / ED Diagnoses   Final diagnoses:  Closed displaced intertrochanteric fracture of right femur, initial encounter [facility])    ED Discharge Orders    None       Sherwood Gambler, MD 11/18/18 [phone]

## 2018-11-18 NOTE — Progress Notes (Signed)
Received message from [facility]. Pt is active HHRN/PT. Jonnie Finner RN CCM Case Mgmt phone [phone]

## 2018-11-18 NOTE — ED Triage Notes (Addendum)
Per EMS, patient from home, reports fall with walker, hitting right eye on entertainment center. Right eye swelling. C/o right hip pain, right forearm, and right breast pain. A&Ox4.  20g L FA 60mcg fentanyl with EMS

## 2018-11-19 ENCOUNTER — Encounter (HOSPITAL_COMMUNITY): Payer: Self-pay | Admitting: *Deleted

## 2018-11-19 ENCOUNTER — Other Ambulatory Visit: Payer: Self-pay

## 2018-11-19 ENCOUNTER — Inpatient Hospital Stay (HOSPITAL_COMMUNITY): Payer: Medicare Other | Admitting: Certified Registered"

## 2018-11-19 ENCOUNTER — Inpatient Hospital Stay (HOSPITAL_COMMUNITY): Payer: Medicare Other

## 2018-11-19 ENCOUNTER — Encounter (HOSPITAL_COMMUNITY)
Admission: EM | Disposition: A | Discharge status: Skilled Nursing Facility | Payer: Self-pay | Source: Home / Self Care | Attending: Internal Medicine

## 2018-11-19 DIAGNOSIS — Z8673 Personal history of transient ischemic attack (TIA), and cerebral infarction without residual deficits: Secondary | ICD-10-CM

## 2018-11-19 DIAGNOSIS — I480 Paroxysmal atrial fibrillation: Secondary | ICD-10-CM

## 2018-11-19 DIAGNOSIS — I10 Essential (primary) hypertension: Secondary | ICD-10-CM

## 2018-11-19 DIAGNOSIS — S72001D Fracture of unspecified part of neck of right femur, subsequent encounter for closed fracture with routine healing: Secondary | ICD-10-CM

## 2018-11-19 DIAGNOSIS — I739 Peripheral vascular disease, unspecified: Secondary | ICD-10-CM

## 2018-11-19 DIAGNOSIS — E782 Mixed hyperlipidemia: Secondary | ICD-10-CM

## 2018-11-19 HISTORY — PX: INTRAMEDULLARY (IM) NAIL INTERTROCHANTERIC: SHX5875

## 2018-11-19 LAB — COMPREHENSIVE METABOLIC PANEL
ALT: 26 U/L (ref 0–44)
AST: 58 U/L — ABNORMAL HIGH (ref 15–41)
Albumin: 3.3 g/dL — ABNORMAL LOW (ref 3.5–5.0)
Alkaline Phosphatase: 51 U/L (ref 38–126)
Anion gap: 11 (ref 5–15)
BUN: 19 mg/dL (ref 8–23)
CO2: 23 mmol/L (ref 22–32)
Calcium: 9.2 mg/dL (ref 8.9–10.3)
Chloride: 104 mmol/L (ref 98–111)
Creatinine, Ser: 0.94 mg/dL (ref 0.44–1.00)
GFR calc Af Amer: >60 mL/min (ref >60)
GFR calc non Af Amer: 55 mL/min — ABNORMAL LOW (ref >60)
Glucose, Bld: 108 mg/dL — ABNORMAL HIGH (ref 70–99)
Potassium: 4.4 mmol/L (ref 3.5–5.1)
Sodium: 138 mmol/L (ref 135–145)
Total Bilirubin: 0.9 mg/dL (ref 0.3–1.2)
Total Protein: 5.8 g/dL — ABNORMAL LOW (ref 6.5–8.1)

## 2018-11-19 LAB — SURGICAL PCR SCREEN
MRSA, PCR: NEGATIVE
Staphylococcus aureus: NEGATIVE

## 2018-11-19 LAB — CBC
HCT: 36.5 % (ref 36.0–46.0)
Hemoglobin: 12.1 g/dL (ref 12.0–15.0)
MCH: 30.7 pg (ref 26.0–34.0)
MCHC: 33.2 g/dL (ref 30.0–36.0)
MCV: 92.6 fL (ref 80.0–100.0)
Platelets: 146 10*3/uL — ABNORMAL LOW (ref 150–400)
RBC: 3.94 MIL/uL (ref 3.87–5.11)
RDW: 12.8 % (ref 11.5–15.5)
WBC: 14.7 10*3/uL — ABNORMAL HIGH (ref 4.0–10.5)
nRBC: 0 % (ref 0.0–0.2)

## 2018-11-19 LAB — PROTIME-INR
INR: 1.3 — ABNORMAL HIGH (ref 0.8–1.2)
Prothrombin Time: 15.8 seconds — ABNORMAL HIGH (ref 11.4–15.2)

## 2018-11-19 LAB — APTT: aPTT: 32 seconds (ref 24–36)

## 2018-11-19 SURGERY — FIXATION, FRACTURE, INTERTROCHANTERIC, WITH INTRAMEDULLARY ROD
Anesthesia: General | Site: Hip | Laterality: Right

## 2018-11-19 MED ORDER — PROPOFOL 10 MG/ML IV BOLUS
INTRAVENOUS | Status: DC | PRN
  Administered 2018-11-19: 70 mg via INTRAVENOUS

## 2018-11-19 MED ORDER — EPHEDRINE 5 MG/ML INJ
INTRAVENOUS | Status: AC
  Filled 2018-11-19: qty 10

## 2018-11-19 MED ORDER — ONDANSETRON HCL 4 MG/2ML IJ SOLN
INTRAMUSCULAR | Status: DC | PRN
  Administered 2018-11-19: 4 mg via INTRAVENOUS

## 2018-11-19 MED ORDER — METOCLOPRAMIDE HCL 5 MG PO TABS: 5.0000 mg | ORAL_TABLET | Freq: Three times a day (TID) | ORAL | Status: DC | PRN

## 2018-11-19 MED ORDER — CEFAZOLIN SODIUM-DEXTROSE 1-4 GM/50ML-% IV SOLN
1.0000 g | Freq: Four times a day (QID) | INTRAVENOUS | Status: AC
  Administered 2018-11-19 – 2018-11-20 (×3): 1 g via INTRAVENOUS
  Filled 2018-11-19 (×3): qty 50

## 2018-11-19 MED ORDER — CHLORHEXIDINE GLUCONATE 4 % EX LIQD
60.0000 mL | Freq: Once | CUTANEOUS | Status: AC
  Administered 2018-11-19: 4 via TOPICAL

## 2018-11-19 MED ORDER — FENTANYL CITRATE (PF) 250 MCG/5ML IJ SOLN
INTRAMUSCULAR | Status: DC | PRN
  Administered 2018-11-19: 50 ug via INTRAVENOUS
  Administered 2018-11-19: 25 ug via INTRAVENOUS

## 2018-11-19 MED ORDER — FENTANYL CITRATE (PF) 100 MCG/2ML IJ SOLN
25.0000 ug | Freq: Once | INTRAMUSCULAR | Status: AC
  Administered 2018-11-19: 25 ug via INTRAVENOUS

## 2018-11-19 MED ORDER — FENTANYL CITRATE (PF) 100 MCG/2ML IJ SOLN
INTRAMUSCULAR | Status: AC
  Administered 2018-11-19: 25 ug via INTRAVENOUS
  Filled 2018-11-19: qty 2

## 2018-11-19 MED ORDER — ONDANSETRON HCL 4 MG PO TABS: 4.0000 mg | ORAL_TABLET | Freq: Four times a day (QID) | ORAL | Status: DC | PRN

## 2018-11-19 MED ORDER — PHENYLEPHRINE HCL-NACL 10-0.9 MG/250ML-% IV SOLN
INTRAVENOUS | Status: AC
  Filled 2018-11-19: qty 250

## 2018-11-19 MED ORDER — METOCLOPRAMIDE HCL 5 MG/ML IJ SOLN: 5.0000 mg | Freq: Three times a day (TID) | INTRAMUSCULAR | Status: DC | PRN

## 2018-11-19 MED ORDER — DEXAMETHASONE SODIUM PHOSPHATE 10 MG/ML IJ SOLN
INTRAMUSCULAR | Status: DC | PRN
  Administered 2018-11-19: 5 mg via INTRAVENOUS

## 2018-11-19 MED ORDER — PHENYLEPHRINE HCL (PRESSORS) 10 MG/ML IV SOLN
INTRAVENOUS | Status: DC | PRN
  Administered 2018-11-19: 80 ug via INTRAVENOUS

## 2018-11-19 MED ORDER — SUCCINYLCHOLINE CHLORIDE 200 MG/10ML IV SOSY
PREFILLED_SYRINGE | INTRAVENOUS | Status: AC
  Filled 2018-11-19: qty 10

## 2018-11-19 MED ORDER — LACTATED RINGERS IV SOLN
INTRAVENOUS | Status: DC
  Administered 2018-11-19 – 2018-11-20 (×2): via INTRAVENOUS

## 2018-11-19 MED ORDER — POVIDONE-IODINE 10 % EX SWAB
2.0000 "application " | Freq: Once | CUTANEOUS | Status: AC
  Administered 2018-11-19: 2 via TOPICAL

## 2018-11-19 MED ORDER — 0.9 % SODIUM CHLORIDE (POUR BTL) OPTIME
TOPICAL | Status: DC | PRN
  Administered 2018-11-19: 1000 mL

## 2018-11-19 MED ORDER — SUCCINYLCHOLINE CHLORIDE 200 MG/10ML IV SOSY
PREFILLED_SYRINGE | INTRAVENOUS | Status: DC | PRN
  Administered 2018-11-19: 100 mg via INTRAVENOUS

## 2018-11-19 MED ORDER — LIDOCAINE 2% (20 MG/ML) 5 ML SYRINGE
INTRAMUSCULAR | Status: DC | PRN
  Administered 2018-11-19: 60 mg via INTRAVENOUS

## 2018-11-19 MED ORDER — CEFAZOLIN SODIUM-DEXTROSE 2-4 GM/100ML-% IV SOLN
2.0000 g | INTRAVENOUS | Status: AC
  Administered 2018-11-19: 2 g via INTRAVENOUS

## 2018-11-19 MED ORDER — EPHEDRINE SULFATE 50 MG/ML IJ SOLN
INTRAMUSCULAR | Status: DC | PRN
  Administered 2018-11-19: 10 mg via INTRAVENOUS
  Administered 2018-11-19: 15 mg via INTRAVENOUS

## 2018-11-19 MED ORDER — BUPIVACAINE-EPINEPHRINE (PF) 0.25% -1:200000 IJ SOLN
INTRAMUSCULAR | Status: AC
  Filled 2018-11-19: qty 30

## 2018-11-19 MED ORDER — FENTANYL CITRATE (PF) 100 MCG/2ML IJ SOLN: 25.0000 ug | INTRAMUSCULAR | Status: DC | PRN

## 2018-11-19 MED ORDER — CEFAZOLIN SODIUM-DEXTROSE 2-4 GM/100ML-% IV SOLN
INTRAVENOUS | Status: AC
  Filled 2018-11-19: qty 100

## 2018-11-19 MED ORDER — PHENYLEPHRINE 40 MCG/ML (10ML) SYRINGE FOR IV PUSH (FOR BLOOD PRESSURE SUPPORT)
PREFILLED_SYRINGE | INTRAVENOUS | Status: AC
  Filled 2018-11-19: qty 10

## 2018-11-19 MED ORDER — SODIUM CHLORIDE 0.9 % IV SOLN
INTRAVENOUS | Status: DC | PRN
  Administered 2018-11-19: 50 ug/min via INTRAVENOUS

## 2018-11-19 MED ORDER — DEXAMETHASONE SODIUM PHOSPHATE 10 MG/ML IJ SOLN
INTRAMUSCULAR | Status: AC
  Filled 2018-11-19: qty 1

## 2018-11-19 MED ORDER — ONDANSETRON HCL 4 MG/2ML IJ SOLN
4.0000 mg | Freq: Four times a day (QID) | INTRAMUSCULAR | Status: DC | PRN
  Administered 2018-11-20: 4 mg via INTRAVENOUS
  Filled 2018-11-19: qty 2

## 2018-11-19 MED ORDER — ONDANSETRON HCL 4 MG/2ML IJ SOLN
INTRAMUSCULAR | Status: AC
  Filled 2018-11-19: qty 2

## 2018-11-19 MED ORDER — MUPIROCIN 2 % EX OINT
1.0000 "application " | TOPICAL_OINTMENT | Freq: Two times a day (BID) | CUTANEOUS | Status: DC
  Administered 2018-11-19 – 2018-11-21 (×5): 1 via NASAL
  Filled 2018-11-19: qty 22

## 2018-11-19 MED ORDER — FENTANYL CITRATE (PF) 250 MCG/5ML IJ SOLN
INTRAMUSCULAR | Status: AC
  Filled 2018-11-19: qty 5

## 2018-11-19 MED ORDER — LIDOCAINE 2% (20 MG/ML) 5 ML SYRINGE
INTRAMUSCULAR | Status: AC
  Filled 2018-11-19: qty 5

## 2018-11-19 MED ORDER — DOCUSATE SODIUM 100 MG PO CAPS
100.0000 mg | ORAL_CAPSULE | Freq: Two times a day (BID) | ORAL | Status: DC
  Administered 2018-11-19 – 2018-11-21 (×4): 100 mg via ORAL
  Filled 2018-11-19 (×4): qty 1

## 2018-11-19 SURGICAL SUPPLY — 40 items
ALCOHOL 70% 16 OZ (MISCELLANEOUS) ×3 IMPLANT
BIT DRILL CALIBRATED 4.2 (BIT) ×1 IMPLANT
BNDG COHESIVE 6X5 TAN STRL LF (GAUZE/BANDAGES/DRESSINGS) ×6 IMPLANT
CANISTER SUCTION WELLS/JOHNSON (MISCELLANEOUS) ×3 IMPLANT
COVER PERINEAL POST (MISCELLANEOUS) ×3 IMPLANT
COVER SURGICAL LIGHT HANDLE (MISCELLANEOUS) ×3 IMPLANT
COVER WAND RF STERILE (DRAPES) ×3 IMPLANT
DRAPE HALF SHEET 40X57 (DRAPES) IMPLANT
DRAPE INCISE IOBAN 66X45 STRL (DRAPES) ×3 IMPLANT
DRAPE STERI IOBAN 125X83 (DRAPES) ×3 IMPLANT
DRILL BIT CALIBRATED 4.2 (BIT) ×3
DRSG ADAPTIC 3X8 NADH LF (GAUZE/BANDAGES/DRESSINGS) ×3 IMPLANT
DRSG EMULSION OIL 3X3 NADH (GAUZE/BANDAGES/DRESSINGS) ×3 IMPLANT
DURAPREP 26ML APPLICATOR (WOUND CARE) ×3 IMPLANT
ELECT CAUTERY BLADE 6.4 (BLADE) ×3 IMPLANT
ELECT REM PT RETURN 9FT ADLT (ELECTROSURGICAL) ×3
ELECTRODE REM PT RTRN 9FT ADLT (ELECTROSURGICAL) ×1 IMPLANT
GAUZE SPONGE 4X4 12PLY STRL (GAUZE/BANDAGES/DRESSINGS) ×3 IMPLANT
GAUZE SPONGE 4X4 12PLY STRL LF (GAUZE/BANDAGES/DRESSINGS) ×3 IMPLANT
GLOVE BIO SURGEON STRL SZ7.5 (GLOVE) ×3 IMPLANT
GLOVE BIOGEL PI IND STRL 8 (GLOVE) ×1 IMPLANT
GLOVE BIOGEL PI INDICATOR 8 (GLOVE) ×2
GOWN STRL REUS W/ TWL LRG LVL3 (GOWN DISPOSABLE) ×1 IMPLANT
GOWN STRL REUS W/ TWL XL LVL3 (GOWN DISPOSABLE) ×1 IMPLANT
GOWN STRL REUS W/TWL LRG LVL3 (GOWN DISPOSABLE) ×2
GOWN STRL REUS W/TWL XL LVL3 (GOWN DISPOSABLE) ×2
GUIDEWIRE 3.2X400 (WIRE) ×6 IMPLANT
KIT BASIN OR (CUSTOM PROCEDURE TRAY) ×3 IMPLANT
KIT TURNOVER KIT B (KITS) ×3 IMPLANT
NAIL TROCH FIX 10X235 RT 130 (Nail) ×3 IMPLANT
NS IRRIG 1000ML POUR BTL (IV SOLUTION) ×3 IMPLANT
PACK GENERAL/GYN (CUSTOM PROCEDURE TRAY) ×3 IMPLANT
PAD ARMBOARD 7.5X6 YLW CONV (MISCELLANEOUS) ×6 IMPLANT
SCREW LOCKING 5.0X34MM (Screw) ×3 IMPLANT
SCREW TFNA HIP 85 STRL (Screw) ×3 IMPLANT
STAPLER VISISTAT 35W (STAPLE) ×3 IMPLANT
SUT MON AB 2-0 CT1 36 (SUTURE) ×3 IMPLANT
TOWEL GREEN STERILE FF (TOWEL DISPOSABLE) ×3 IMPLANT
TOWEL OR 17X26 10 PK STRL BLUE (TOWEL DISPOSABLE) ×3 IMPLANT
WATER STERILE IRR 1000ML POUR (IV SOLUTION) ×3 IMPLANT

## 2018-11-19 NOTE — Progress Notes (Signed)
PROGRESS NOTE  [name] DasLou B Tibbs EAV:[id number] DOB: Sep 22, 1928 DOA: 11/18/2018 PCP: Donita BrooksPickard, Warren T, MD  Brief History   [name] is a 85 y.o. female with medical history significant of CAD s/p CABG, heart failure, CVA, HTN, HLD, depression, PVD s/p CEA and multiple other medical problems presenting after mechanical fall with R hip fracture.  History is limited as pt recently received morphine.  Hx is obtained from pt, medical record, and niece.  Pt notes she fell last night when walking to bathroom to get water.  She notes her cane slipped from under her.  She did hit her head, but no LOC.  Hit R side.  She notes that she was on the ground all night until family found her.  No LH or dizziness, no HA or CP.  She hit her R wrist as well.  Per discussion with her niece, [name], she was found down in the den and her story did not quite match where she was found in the morning.  Some ? About the story.  Per nursing, pt was a bit more confused when I spoke with her, but was A&Ox4 before receiving morphine.  Denies smoking and etoh.  Pt lives alone.  Her niece visits her 3x weekly and family and friends bring food.  She gets around with a cane/walker, but often doesn't use assistive device in the home.   [facility] were consulted to admit the patient for acute fracture of her right hip. Orthopedic surgery was consulted. Plan is for the patient to go for intramedullary nailing of her right femur today.  Consultants  . Orthopedic surgery  Procedures  . None  Antibiotics   Anti-infectives (From admission, onward)   Start     Dose/Rate Route Frequency Ordered Stop   11/20/18 0600  ceFAZolin (ANCEF) IVPB 2g/100 mL premix     2 g 200 mL/hr over 30 Minutes Intravenous On call to O.R. 11/19/18 1535 11/19/18 1637   11/19/18 1534  ceFAZolin (ANCEF) 2-4 GM/100ML-% IVPB    Note to Pharmacy: Sandi RavelingSchonewitz, Leigh   : cabinet override      11/19/18 1534 11/19/18 1607    .  Marland Kitchen.   Subjective  The  patient is somnolent this morning, but easily aroused. She is complaining of pain in her left arm from her IV.   Objective   Vitals:  Vitals:   11/19/18 0509 11/19/18 0829  BP: (!) 113/35 (!) 118/48  Pulse: 76 84  Resp: 15 15  Temp: 98.5 F (36.9 C) 98.3 F (36.8 C)  SpO2: (!) 89% 95%    Exam:  Constitutional:  . The patient is elderly, weak, and frail. She is somnolent, but easily aroused. Mild acute distress from IV in her left forearm. Respiratory:  . No increased work of breathing. . No wheezes, rales, or rhonchi. . No tactile fremitus Cardiovascular:  . Regular rate and rhythm . No murmurs, ectopy, or gallups. . No lateral PMI. No thrills. Abdomen:  . Abdomen is soft, non-tender, non-distended. . No hernias, masses, or organomegaly is appreciated. . Normoactive bowel sounds. Musculoskeletal:  . No cyanosis, clubbing or edema. Peri [place]. Good capillary refill. Skin:  . No rashes, lesions, ulcers . palpation of skin: no induration or nodules Neurologic:  . CN 2-12 intact . Sensation all 4 extremities intact Psychiatric:  . Mental status o Mood, affect appropriate o Orientation to person, place, time  . judgment and insight  - I am unable to judge these at this time  due to somnolence and pain medications.   I have personally reviewed the following:   Today's Data  . CBC, CMP, Vitals  Micro Data  . PCR for MRSA negative   Scheduled Meds: . [MAR Hold] acetaminophen  1,000 mg Oral Q8H  . [MAR Hold] isosorbide mononitrate  60 mg Oral Daily  . [MAR Hold] mupirocin ointment  1 application Nasal BID  . [MAR Hold] nebivolol  10 mg Oral Daily  . [MAR Hold] pantoprazole  40 mg Oral Daily  . [MAR Hold] polyethylene glycol  17 g Oral Daily  . [MAR Hold] potassium chloride  10 mEq Oral Daily  . [MAR Hold] rosuvastatin  20 mg Oral Daily   Continuous Infusions: . [START ON 11/20/2018]  ceFAZolin (ANCEF) IV    . lactated ringers    . lactated ringers 10 mL/hr at 11/19/18  1516    Active Problems:   Hip fracture ([place])   LOS: 1 day   A & P   Right Hip Fracture  Mechanical Fall: Pt reports cane slipped out from underneath her while walking.  No CP, LH, SOB, LOC.  X-ray demonstrated a right hip fracture. Head/maxilloface/neck CT without acute abnormality, rib films negative, plain films of R forearm, humerus, elbow negative.  Discussed with pt and family (niece, [name] Schmid) at admission, RCRI 3 (hx CAD, HF, stroke), risk of MACE ~15%.  Risk of surgery is not insignificant, but I think benefit of surgical fixation of hip fracture likely outweighs the risk.  She was living independently prior to this admission for her hip fracture.  No evidence of HF on exam or murmur.  Low risk stress test in 2016.  Echo in 2017 with grade 1 diastolic dysfunction and normal EF.  I do not believe additional cardiac testing would change management at this time. The patient is receiving APA and oxycodone as needed for pain. She has IV morphine for breakthrough pain. She is on a bowel regimen. DVT prophylaxis is SCD's. She has been NPO this morning.  Rhabdomyolysis: Elevated CK. Will follow. It is unknown how long she may have been down.  Acute Encephalopathy: 2/2 morphine, will try lower dose.  Delirium precautions.  CAD s/p CABG in 1997 and 2010: pt asx.  Continue bystolic, crestor, imdur.  She reports to me she's not taking ASA anymore, but can't say why.  Hx HFrEF  HFpEF: EF improved on 2017 echo with EF of 55 - 6-% at that time with grade one diastolic dysfunction.  Appears euvolemic.  Follow.  HTN: Blood pressures are well controlled. Continue bystolic and imdur as at home.  Hx CVA: Noted. Continue crestor, pt has ASA on med list, but says she hasn't taken it recently  Hx PVD s/p R carotid endarterectomy: Noted. Continue ASA, Crestor. Monitor blood pressure.  Leukocytosis: likely reactive, afebrile.  Follow.  I have seen and examined this patient myself. I have spent  34 minutes in her evaluation and care.  COVID19 test: Negative DVT prophylaxis: SCD Code Status: DNR - per review of prior notes in chart as well as discussion with pt niece - pt was notably a bit confused, would discuss again with pt when a&o. Family Communication: None available Disposition Plan: pending  Shaylen Nephew, DO [facility] Direct contact: see [email or web address]  7PM-7AM contact night coverage as above  11/19/2018, 4:34 PM  LOS: 1 day

## 2018-11-19 NOTE — Progress Notes (Signed)
Report given to Manuela Schwartz, RN in Short Stay.  The patient is ready for surgery.

## 2018-11-19 NOTE — Anesthesia Postprocedure Evaluation (Signed)
Anesthesia Post Note  Patient: [name]  Procedure(s) Performed: INTRAMEDULLARY (IM) NAIL INTERTROCHANTRIC (Right Hip)     Patient location during evaluation: PACU Anesthesia Type: General Level of consciousness: awake and alert Pain management: pain level controlled Vital Signs Assessment: post-procedure vital signs reviewed and stable Respiratory status: spontaneous breathing, nonlabored ventilation, respiratory function stable and patient connected to nasal cannula oxygen Cardiovascular status: blood pressure returned to baseline and stable Postop Assessment: no apparent nausea or vomiting Anesthetic complications: no    Last Vitals:  Vitals:   11/19/18 1810 11/19/18 1820  BP: 120/61 120/60  Pulse: 99 92  Resp: 12 16  Temp:    SpO2: 94% 98%    Last Pain:  Vitals:   11/19/18 1820  TempSrc:   PainSc: Asleep                 Jiyaan Steinhauser,W. EDMOND

## 2018-11-19 NOTE — Discharge Instructions (Signed)
Orthopedic discharge instructions: -Okay for full weightbearing as tolerated to the right lower extremity. -Maintain postoperative bandages until your follow-up appointment with Dr. Stann Mainland in 2 weeks. -You may shower with the postoperative bandages in place but do not submerge underwater.  -For the prevention of blood clots you should take an 81 mg aspirin twice daily for 6 weeks.  -Apply ice to the right hip for 20 to 30 minutes at a time 4-5 times per day.

## 2018-11-19 NOTE — Anesthesia Preprocedure Evaluation (Addendum)
Anesthesia Evaluation  Patient identified by MRN, date of birth, ID band Patient awake    Reviewed: Allergy & Precautions, H&P , NPO status , Patient's Chart, lab work & pertinent test results, reviewed documented beta blocker date and time   Airway Mallampati: III  TM Distance: >3 FB Neck ROM: Full    Dental no notable dental hx. (+) Teeth Intact, Dental Advisory Given   Pulmonary asthma ,    Pulmonary exam normal breath sounds clear to auscultation       Cardiovascular hypertension, Pt. on medications and Pt. on home beta blockers + CAD, + CABG, + Peripheral Vascular Disease and +CHF   Rhythm:Regular Rate:Normal     Neuro/Psych Depression CVA    GI/Hepatic Neg liver ROS, hiatal hernia, GERD  Medicated and Controlled,  Endo/Other  negative endocrine ROS  Renal/GU negative Renal ROS  negative genitourinary   Musculoskeletal   Abdominal   Peds  Hematology negative hematology ROS (+)   Anesthesia Other Findings   Reproductive/Obstetrics negative OB ROS                            Anesthesia Physical Anesthesia Plan  ASA: III  Anesthesia Plan: General   Post-op Pain Management:    Induction: Intravenous  PONV Risk Score and Plan: 4 or greater and Ondansetron, Dexamethasone and Treatment may vary due to age or medical condition  Airway Management Planned: Oral ETT  Additional Equipment:   Intra-op Plan:   Post-operative Plan: Extubation in OR  Informed Consent: I have reviewed the patients History and Physical, chart, labs and discussed the procedure including the risks, benefits and alternatives for the proposed anesthesia with the patient or authorized representative who has indicated his/her understanding and acceptance.   Patient has DNR.  Discussed DNR with patient and Suspend DNR.   Dental advisory given  Plan Discussed with: CRNA  Anesthesia Plan Comments:         Anesthesia Quick Evaluation

## 2018-11-19 NOTE — Consult Note (Signed)
Reason for Consult:Right hip fx Referring Physician: A Swayze  [name] [name] [name] is an 85 y.o. female.  HPI: [name] HiddenLou fell at home yesterday. She is delirious this morning and cannot contribute reliably to history, which was gleaned from the chart. She c/o right arm and leg pain. She also says she had surgery last night (she did not). Her ED workup showed a right intertroch hip fx and orthopedic surgery was consulted.  Past Medical History:  Diagnosis Date  . Allergy   . Asthma   . CAD (coronary artery disease)    s/p CABG x 5 in 1997 & CABG x 3 in 2010; Last cath in April of 2010 showed grafts to be patent. EF is 40 to 45%  . CHF (congestive heart failure) (HCC)    EF is 40 to 45%  . Depression    following bypass surgery  . Diverticulosis   . Endometriosis   . GERD (gastroesophageal reflux disease)   . Hepatitis A   . Hiatal hernia   . History of cerebrovascular disease   . History of vertebral fracture    at T8  . Hyperlipemia   . Hypertension   . LBP (low back pain)   . Mixed incontinence urge and stress 04/22/2013  . Nocturia 04/22/2013  . Pleural effusion    post surgical  . PVD (peripheral vascular disease) (HCC)    S/P right CEA  . Sleep apnea   . Stroke [facility](HCC) 2010    Past Surgical History:  Procedure Laterality Date  . APPENDECTOMY    . CARDIAC CATHETERIZATION  April 2010   Grafts were patent. EF is 40 to 45%  . CAROTID ENDARTERECTOMY     right  . CORONARY ARTERY BYPASS GRAFT  [id number],2010   Original surgery in 1997 x 5; Redo CABG x 3 in 2010 including free RIMA to distal LAD, SVG to OM and SVG to left posterolateral  . KNEE SURGERY     left  . LEFT HEART CATHETERIZATION WITH CORONARY/GRAFT ANGIOGRAM N/A 03/12/2013   Procedure: LEFT HEART CATHETERIZATION WITH Isabel CapriceORONARY/GRAFT ANGIOGRAM;  Surgeon: Peter M SwazilandJordan, MD;  Location: [facility] CATH LAB;  Service: Cardiovascular;  Laterality: N/A;  . PARTIAL COLECTOMY     due to colon abcess    Family History  Problem Relation Age of  Onset  . Heart attack Mother   . Diabetes Mother   . Heart disease Mother   . Rheum arthritis Mother   . Heart attack Father   . Allergies Father   . Diabetes Sister   . Stroke Sister   . Heart disease Sister   . Dementia Brother   . Healthy Sister   . Other Brother        LUNG ISSUES  . Other Brother        LUNG ISSUES  . Other Brother        LUNG ISSUES  . Diabetes Maternal Grandmother   . Diabetes Paternal Grandmother   . Colon cancer Neg Hx     Social History:  reports that she has never smoked. She has never used smokeless tobacco. She reports that she does not drink alcohol or use drugs.  Allergies:  Allergies  Allergen Reactions  . Metoprolol Other (See Comments)    allopoecia  . Phenergan [Promethazine Hcl] Other (See Comments)    Facial numbness  . Prochlorperazine Edisylate Anaphylaxis  . Zetia [Ezetimibe] Nausea Only and Other (See Comments)    Abdominal pain  . Compazine [Prochlorperazine]  Facial  Paralysis       . Sulfur Other (See Comments)    paralyzes  face   . Niacin Rash  . Sulfonamide Derivatives Nausea And Vomiting    Medications: I have reviewed the patient's current medications.  Results for orders placed or performed during the hospital encounter of 11/18/18 (from the past 48 hour(s))  Comprehensive metabolic panel     Status: Abnormal   Collection Time: 11/18/18  2:15 PM  Result Value Ref Range   Sodium 138 135 - 145 mmol/L   Potassium 4.2 3.5 - 5.1 mmol/L   Chloride 103 98 - 111 mmol/L   CO2 23 22 - 32 mmol/L   Glucose, Bld 144 (H) 70 - 99 mg/dL   BUN 22 8 - 23 mg/dL   Creatinine, Ser 1.61 0.44 - 1.00 mg/dL   Calcium 9.1 8.9 - 09.6 mg/dL   Total Protein 6.7 6.5 - 8.1 g/dL   Albumin 3.8 3.5 - 5.0 g/dL   AST 48 (H) 15 - 41 U/L   ALT 23 0 - 44 U/L   Alkaline Phosphatase 60 38 - 126 U/L   Total Bilirubin 0.9 0.3 - 1.2 mg/dL   GFR calc non Af Amer 59 (L) >60 mL/min   GFR calc Af Amer >60 >60 mL/min   Anion gap 12 5 - 15     Comment: Performed at [facility], [address] [place], [place] [address]  CBC with Differential     Status: Abnormal   Collection Time: 11/18/18  2:15 PM  Result Value Ref Range   WBC 18.9 (H) 4.0 - 10.5 K/uL   RBC 4.24 3.87 - 5.11 MIL/uL   Hemoglobin 13.1 12.0 - 15.0 g/dL   HCT 98.1 19.1 - 47.8 %   MCV 92.9 80.0 - 100.0 fL   MCH 30.9 26.0 - 34.0 pg   MCHC 33.2 30.0 - 36.0 g/dL   RDW 29.5 62.1 - 30.8 %   Platelets 169 150 - 400 K/uL   nRBC 0.0 0.0 - 0.2 %   Neutrophils Relative % 85 %   Neutro Abs 16.2 (H) 1.7 - 7.7 K/uL   Lymphocytes Relative 7 %   Lymphs Abs 1.3 0.7 - 4.0 K/uL   Monocytes Relative 7 %   Monocytes Absolute 1.3 (H) 0.1 - 1.0 K/uL   Eosinophils Relative 0 %   Eosinophils Absolute 0.0 0.0 - 0.5 K/uL   Basophils Relative 0 %   Basophils Absolute 0.0 0.0 - 0.1 K/uL   Immature Granulocytes 1 %   Abs Immature Granulocytes 0.10 (H) 0.00 - 0.07 K/uL    Comment: Performed at [facility], [address], [place], [place] [address]  Protime-INR     Status: None   Collection Time: 11/18/18  2:15 PM  Result Value Ref Range   Prothrombin Time 14.5 11.4 - 15.2 seconds   INR 1.1 0.8 - 1.2    Comment: (NOTE) INR goal varies based on device and disease states. Performed at [facility], [address], [place], [place] [address]   CK     Status: Abnormal   Collection Time: 11/18/18  2:15 PM  Result Value Ref Range   Total CK 1,070 (H) 38 - 234 U/L    Comment: Performed at [facility], [address] [place], [place] [address]  SARS Coronavirus 2 (CEPHEID - Performed in [facility] hospital lab), [facility] Order     Status: None  Collection Time: 11/18/18  3:13 PM   Specimen: Nasopharyngeal Swab  Result Value Ref Range   SARS Coronavirus 2 NEGATIVE NEGATIVE    Comment: (NOTE) If result is NEGATIVE SARS-CoV-2 target nucleic acids are NOT DETECTED. The SARS-CoV-2 RNA is generally detectable  in upper and lower  respiratory specimens during the acute phase of infection. The lowest  concentration of SARS-CoV-2 viral copies this assay can detect is 250  copies / mL. A negative result does not preclude SARS-CoV-2 infection  and should not be used as the sole basis for treatment or other  patient management decisions.  A negative result may occur with  improper specimen collection / handling, submission of specimen other  than nasopharyngeal swab, presence of viral mutation(s) within the  areas targeted by this assay, and inadequate number of viral copies  (<250 copies / mL). A negative result must be combined with clinical  observations, patient history, and epidemiological information. If result is POSITIVE SARS-CoV-2 target nucleic acids are DETECTED. The SARS-CoV-2 RNA is generally detectable in upper and lower  respiratory specimens dur ing the acute phase of infection.  Positive  results are indicative of active infection with SARS-CoV-2.  Clinical  correlation with patient history and other diagnostic information is  necessary to determine patient infection status.  Positive results do  not rule out bacterial infection or co-infection with other viruses. If result is PRESUMPTIVE POSTIVE SARS-CoV-2 nucleic acids MAY BE PRESENT.   A presumptive positive result was obtained on the submitted specimen  and confirmed on repeat testing.  While 2019 novel coronavirus  (SARS-CoV-2) nucleic acids may be present in the submitted sample  additional confirmatory testing may be necessary for epidemiological  and / or clinical management purposes  to differentiate between  SARS-CoV-2 and other Sarbecovirus currently known to infect humans.  If clinically indicated additional testing with an alternate test  methodology [phone]) is advised. The SARS-CoV-2 RNA is generally  detectable in upper and lower respiratory sp ecimens during the acute  phase of infection. The expected result is  Negative. Fact Sheet for Patients:  StrictlyIdeas.no Fact Sheet for Healthcare Providers: BankingDealers.co.za This test is not yet approved or cleared by the [place] FDA and has been authorized for detection and/or diagnosis of SARS-CoV-2 by FDA under an Emergency Use Authorization (EUA).  This EUA will remain in effect (meaning this test can be used) for the duration of the COVID-19 declaration under Section 564([name])(1) of the Act, 21 U.S.C. section 360bbb-3([name])(1), unless the authorization is terminated or revoked sooner. Performed at [facility], [place] [address] [place], [place] [address]   CBC     Status: Abnormal   Collection Time: 11/18/18  9:26 PM  Result Value Ref Range   WBC 17.7 (H) 4.0 - 10.5 K/uL   RBC 4.14 3.87 - 5.11 MIL/uL   Hemoglobin 12.8 12.0 - 15.0 g/dL   HCT 37.6 36.0 - 46.0 %   MCV 90.8 80.0 - 100.0 fL   MCH 30.9 26.0 - 34.0 pg   MCHC 34.0 30.0 - 36.0 g/dL   RDW 12.5 11.5 - 15.5 %   Platelets 155 150 - 400 K/uL   nRBC 0.0 0.0 - 0.2 %    Comment: Performed at [facility], [place] [address] [place], [place] [address]  Creatinine, serum     Status: Abnormal   Collection Time: 11/18/18  9:26 PM  Result Value Ref Range   Creatinine, Ser 1.10 (H) 0.44 - 1.00 mg/dL  GFR calc non Af Amer 45 (L) >60 mL/min   GFR calc Af Amer 52 (L) >60 mL/min    Comment: Performed at [facility], [address] [place], [place] [address]  Comprehensive metabolic panel     Status: Abnormal   Collection Time: 11/19/18  4:51 AM  Result Value Ref Range   Sodium 138 135 - 145 mmol/L   Potassium 4.4 3.5 - 5.1 mmol/L   Chloride 104 98 - 111 mmol/L   CO2 23 22 - 32 mmol/L   Glucose, Bld 108 (H) 70 - 99 mg/dL   BUN 19 8 - 23 mg/dL   Creatinine, Ser 0.98 0.44 - 1.00 mg/dL   Calcium 9.2 8.9 - 11.9 mg/dL   Total Protein 5.8 (L) 6.5 - 8.1 g/dL   Albumin 3.3 (L) 3.5 - 5.0 g/dL   AST 58 (H) 15 - 41 U/L    ALT 26 0 - 44 U/L   Alkaline Phosphatase 51 38 - 126 U/L   Total Bilirubin 0.9 0.3 - 1.2 mg/dL   GFR calc non Af Amer 55 (L) >60 mL/min   GFR calc Af Amer >60 >60 mL/min   Anion gap 11 5 - 15    Comment: Performed at [facility], [address] [place], [place] [address]  CBC     Status: Abnormal   Collection Time: 11/19/18  4:51 AM  Result Value Ref Range   WBC 14.7 (H) 4.0 - 10.5 K/uL   RBC 3.94 3.87 - 5.11 MIL/uL   Hemoglobin 12.1 12.0 - 15.0 g/dL   HCT 95.6 21.3 - 08.6 %   MCV 92.6 80.0 - 100.0 fL   MCH 30.7 26.0 - 34.0 pg   MCHC 33.2 30.0 - 36.0 g/dL   RDW 57.8 46.9 - 62.9 %   Platelets 146 (L) 150 - 400 K/uL   nRBC 0.0 0.0 - 0.2 %    Comment: Performed at [facility], [address] [place], [place] [address]  Protime-INR     Status: Abnormal   Collection Time: 11/19/18  4:51 AM  Result Value Ref Range   Prothrombin Time 15.8 (H) 11.4 - 15.2 seconds   INR 1.3 (H) 0.8 - 1.2    Comment: (NOTE) INR goal varies based on device and disease states. Performed at [facility], [address], [place], [place] [address]   APTT     Status: None   Collection Time: 11/19/18  4:51 AM  Result Value Ref Range   aPTT 32 24 - 36 seconds    Comment: Performed at [facility], [address], [place], [place] [address]    Dg Ribs Unilateral W/chest Right  Result Date: 11/18/2018 CLINICAL DATA:  Fall.  Pain. EXAM: RIGHT RIBS AND CHEST - 3+ VIEW COMPARISON:  09/10/2018. FINDINGS: Mediastinum hilar structures normal. Prior CABG. Heart size normal. Lungs are clear. No pleural effusion or pneumothorax. Prior midthoracic vertebroplasty. Old left posterior seventh rib fracture. No acute bony abnormality. IMPRESSION: 1.  Prior CABG.  No acute cardiopulmonary disease. 2.  No acute bony abnormality.  No pneumothorax. Electronically Signed   By: Maisie Fus  Register   On: 11/18/2018 14:03   Dg Elbow Complete Right  Result Date: 11/18/2018 CLINICAL DATA:  Larey Seat.  Right elbow  pain. EXAM: RIGHT ELBOW - COMPLETE 3+ VIEW COMPARISON:  None. FINDINGS: Mild degenerative changes but no acute fracture or IMPRESSION: Degenerative changes but no acute bony findings or joint effusion.  Electronically Signed   By: Rudie MeyerP.  Gallerani M.D.   On: 11/18/2018 14:46   Dg Forearm Right  Result Date: 11/18/2018 CLINICAL DATA:  Larey SeatFell.  Right forearm pain. EXAM: RIGHT FOREARM - 2 VIEW COMPARISON:  None. FINDINGS: The wrist and elbow joints are maintained. No acute forearm fractures are identified. IMPRESSION: No acute bony findings. Electronically Signed   By: Rudie MeyerP.  Gallerani M.D.   On: 11/18/2018 14:47   Ct Head Wo Contrast  Result Date: 11/18/2018 CLINICAL DATA:  Fall. Right eye swelling. EXAM: CT HEAD WITHOUT CONTRAST CT MAXILLOFACIAL WITHOUT CONTRAST CT CERVICAL SPINE WITHOUT CONTRAST TECHNIQUE: Multidetector CT imaging of the head, cervical spine, and maxillofacial structures were performed using the standard protocol without intravenous contrast. Multiplanar CT image reconstructions of the cervical spine and maxillofacial structures were also generated. COMPARISON:  For 15 20 FINDINGS: CT HEAD FINDINGS Brain: There is no evidence of acute infarct, intracranial hemorrhage, mass, midline shift, or extra-axial fluid collection. Cerebral white matter hypodensities are nonspecific but compatible with mild for age chronic small vessel ischemic disease. Mild cerebral atrophy is unchanged. Vascular: Calcified atherosclerosis at the skull base. No hyperdense vessel. Skull: No fracture or suspicious osseous lesion. Other: None. CT MAXILLOFACIAL FINDINGS Osseous: No acute fracture or destructive osseous process. Decreased displacement/angulation of nasal bone fractures which were seen on the prior study. Orbits: Bilateral cataract extraction. Moderate right periorbital soft tissue swelling extending laterally and superiorly into the scalp. Sinuses: Trace right maxillary sinus fluid. Minimal bilateral ethmoid sinus  mucosal thickening. Clear mastoid air cells. Soft tissues: No additional findings. CT CERVICAL SPINE FINDINGS The study is mildly to moderately motion degraded. Alignment: Trace anterolisthesis of C7 on T1. Skull base and vertebrae: No acute fracture identified within limitations of motion artifact. No destructive osseous lesion. Soft tissues and spinal canal: No evidence of significant prevertebral swelling or visible spinal canal hematoma. Disc levels: Mild disc and moderate facet degeneration in the cervical spine. No high-grade osseous neural foraminal or spinal canal stenosis. Upper chest: Mild biapical pleuroparenchymal lung scarring. Other: Unchanged 1.7 cm hypoattenuating left thyroid nodule. Calcified atherosclerosis at the carotid bifurcations, extensive on the right. Mild superficial swelling in the soft tissues of the right lateral neck. IMPRESSION: 1. No evidence of acute intracranial abnormality. 2. Right periorbital, scalp, and neck soft tissue swelling. 3. No acute maxillofacial fracture. 4. No acute cervical spine fracture identified within limitations of motion artifact. Electronically Signed   By: Sebastian AcheAllen  Grady M.D.   On: 11/18/2018 14:26   Ct Cervical Spine Wo Contrast  Result Date: 11/18/2018 CLINICAL DATA:  Fall. Right eye swelling. EXAM: CT HEAD WITHOUT CONTRAST CT MAXILLOFACIAL WITHOUT CONTRAST CT CERVICAL SPINE WITHOUT CONTRAST TECHNIQUE: Multidetector CT imaging of the head, cervical spine, and maxillofacial structures were performed using the standard protocol without intravenous contrast. Multiplanar CT image reconstructions of the cervical spine and maxillofacial structures were also generated. COMPARISON:  For 15 20 FINDINGS: CT HEAD FINDINGS Brain: There is no evidence of acute infarct, intracranial hemorrhage, mass, midline shift, or extra-axial fluid collection. Cerebral white matter hypodensities are nonspecific but compatible with mild for age chronic small vessel ischemic  disease. Mild cerebral atrophy is unchanged. Vascular: Calcified atherosclerosis at the skull base. No hyperdense vessel. Skull: No fracture or suspicious osseous lesion. Other: None. CT MAXILLOFACIAL FINDINGS Osseous: No acute fracture or destructive osseous process. Decreased displacement/angulation of nasal bone fractures which were seen on the prior study. Orbits: Bilateral cataract extraction. Moderate right periorbital soft tissue swelling extending laterally and superiorly  into the scalp. Sinuses: Trace right maxillary sinus fluid. Minimal bilateral ethmoid sinus mucosal thickening. Clear mastoid air cells. Soft tissues: No additional findings. CT CERVICAL SPINE FINDINGS The study is mildly to moderately motion degraded. Alignment: Trace anterolisthesis of C7 on T1. Skull base and vertebrae: No acute fracture identified within limitations of motion artifact. No destructive osseous lesion. Soft tissues and spinal canal: No evidence of significant prevertebral swelling or visible spinal canal hematoma. Disc levels: Mild disc and moderate facet degeneration in the cervical spine. No high-grade osseous neural foraminal or spinal canal stenosis. Upper chest: Mild biapical pleuroparenchymal lung scarring. Other: Unchanged 1.7 cm hypoattenuating left thyroid nodule. Calcified atherosclerosis at the carotid bifurcations, extensive on the right. Mild superficial swelling in the soft tissues of the right lateral neck. IMPRESSION: 1. No evidence of acute intracranial abnormality. 2. Right periorbital, scalp, and neck soft tissue swelling. 3. No acute maxillofacial fracture. 4. No acute cervical spine fracture identified within limitations of motion artifact. Electronically Signed   By: Sebastian AcheAllen  Grady M.D.   On: 11/18/2018 14:26   Dg Humerus Right  Result Date: 11/18/2018 CLINICAL DATA:  Larey SeatFell today.  Right arm pain. EXAM: RIGHT HUMERUS - 2+ VIEW COMPARISON:  None. FINDINGS: AC joint and glenohumeral joint degenerative  changes are noted. The elbow joint is maintained. No acute fracture of the humerus is identified. Moderate soft tissue calcifications are noted around the right shoulder consistent with calcific tendinitis. IMPRESSION: No acute fracture. Electronically Signed   By: Rudie MeyerP.  Gallerani M.D.   On: 11/18/2018 14:48   Dg Hip Unilat With Pelvis 2-3 Views Right  Result Date: 11/18/2018 CLINICAL DATA:  Larey SeatFell.  Right hip pain. EXAM: DG HIP (WITH OR WITHOUT PELVIS) 2-3V RIGHT COMPARISON:  None. FINDINGS: Displaced intertrochanteric fracture of the right hip is noted. Both hips are normally located. Moderate to advanced degenerative changes. The bony pelvis appears intact. IMPRESSION: Displaced intertrochanteric fracture of the right hip. Electronically Signed   By: Rudie MeyerP.  Gallerani M.D.   On: 11/18/2018 14:51   Dg Femur Min 2 Views Right  Result Date: 11/18/2018 CLINICAL DATA:  Larey SeatFell.  Right leg pain. EXAM: RIGHT FEMUR 2 VIEWS COMPARISON:  None. FINDINGS: Displaced intertrochanteric fracture of the right hip is noted. No femoral shaft fracture. Moderate degenerative changes and chondrocalcinosis noted at the knee. Extensive vascular calcifications. IMPRESSION: Right hip fracture but no femoral shaft fracture. Electronically Signed   By: Rudie MeyerP.  Gallerani M.D.   On: 11/18/2018 14:51   Ct Maxillofacial Wo Cm  Result Date: 11/18/2018 CLINICAL DATA:  Fall. Right eye swelling. EXAM: CT HEAD WITHOUT CONTRAST CT MAXILLOFACIAL WITHOUT CONTRAST CT CERVICAL SPINE WITHOUT CONTRAST TECHNIQUE: Multidetector CT imaging of the head, cervical spine, and maxillofacial structures were performed using the standard protocol without intravenous contrast. Multiplanar CT image reconstructions of the cervical spine and maxillofacial structures were also generated. COMPARISON:  For 15 20 FINDINGS: CT HEAD FINDINGS Brain: There is no evidence of acute infarct, intracranial hemorrhage, mass, midline shift, or extra-axial fluid collection. Cerebral white  matter hypodensities are nonspecific but compatible with mild for age chronic small vessel ischemic disease. Mild cerebral atrophy is unchanged. Vascular: Calcified atherosclerosis at the skull base. No hyperdense vessel. Skull: No fracture or suspicious osseous lesion. Other: None. CT MAXILLOFACIAL FINDINGS Osseous: No acute fracture or destructive osseous process. Decreased displacement/angulation of nasal bone fractures which were seen on the prior study. Orbits: Bilateral cataract extraction. Moderate right periorbital soft tissue swelling extending laterally and superiorly into the scalp. Sinuses: Trace  right maxillary sinus fluid. Minimal bilateral ethmoid sinus mucosal thickening. Clear mastoid air cells. Soft tissues: No additional findings. CT CERVICAL SPINE FINDINGS The study is mildly to moderately motion degraded. Alignment: Trace anterolisthesis of C7 on T1. Skull base and vertebrae: No acute fracture identified within limitations of motion artifact. No destructive osseous lesion. Soft tissues and spinal canal: No evidence of significant prevertebral swelling or visible spinal canal hematoma. Disc levels: Mild disc and moderate facet degeneration in the cervical spine. No high-grade osseous neural foraminal or spinal canal stenosis. Upper chest: Mild biapical pleuroparenchymal lung scarring. Other: Unchanged 1.7 cm hypoattenuating left thyroid nodule. Calcified atherosclerosis at the carotid bifurcations, extensive on the right. Mild superficial swelling in the soft tissues of the right lateral neck. IMPRESSION: 1. No evidence of acute intracranial abnormality. 2. Right periorbital, scalp, and neck soft tissue swelling. 3. No acute maxillofacial fracture. 4. No acute cervical spine fracture identified within limitations of motion artifact. Electronically Signed   By: Sebastian Ache M.D.   On: 11/18/2018 14:26    Review of Systems  Unable to perform ROS: Dementia  Musculoskeletal: Positive for joint  pain (Right shoulder, right leg).   Blood pressure (!) 118/48, pulse 84, temperature 98.3 F (36.8 C), temperature source Oral, resp. rate 15, SpO2 95 %. Physical Exam  Constitutional: She appears well-developed and well-nourished. No distress.  HENT:  Head: Normocephalic and atraumatic.  Eyes: Conjunctivae are normal. Right eye exhibits no discharge. Left eye exhibits no discharge. No scleral icterus.  Neck: Normal range of motion.  Cardiovascular: Normal rate and regular rhythm.  Respiratory: Effort normal. No respiratory distress.  Musculoskeletal:     Comments: Right shoulder, elbow, wrist, digits- no skin wounds, shoulder TTP, no instability, no blocks to motion  Sens  Ax/R/M/U intact  Mot   Ax/ R/ PIN/ M/ AIN/ U grossly intact  Rad 2+  RLE No traumatic wounds, ecchymosis, or rash  Hip minimal TTP  No knee or ankle effusion  Knee stable to varus/ valgus and anterior/posterior stress but painful with manipulation Sens DPN, SPN, TN intact  Motor EHL, ext, flex, evers 0/5 (expect volitional)  DP 2+, PT 2+, No significant edema  Neurological: She is alert.  Skin: Skin is warm and dry. She is not diaphoretic.  Psychiatric: She has a normal mood and affect. Her behavior is normal.    Assessment/Plan: Right hip fx -- Plan IMN today by Dr. Aundria Rud. Please keep NPO. Multiple medical problems including CAD s/p CABG, heart failure, CVA, HTN, HLD, depression, PVD s/p CEA -- per primary service    Freeman Caldron, PA-C Orthopedic Surgery [phone] 11/19/2018, 10:01 AM

## 2018-11-19 NOTE — Op Note (Addendum)
Date of Surgery: 11/19/2018  INDICATIONS: [name] is a 85 y.o.-year-old female who sustained a right hip fracture. The risks and benefits of the procedure discussed with the patient prior to the procedure and all questions were answered; consent was obtained.  PREOPERATIVE DIAGNOSIS: right hip fracture   POSTOPERATIVE DIAGNOSIS: Same   PROCEDURE: Treatment of intertrochanteric, pertrochanteric, subtrochanteric fracture with intramedullary implant. CPT [phone]   SURGEON: Kathi DerJason P. Aundria Rudogers, M.D.   ANESTHESIA: general   IV FLUIDS AND URINE: See anesthesia record   ESTIMATED BLOOD LOSS: 50 cc  IMPLANTS:  Synthes TFN A  10 x 235 mm 85 mm proximal compression screw 34 x 5.0 mm distal interlock  DRAINS: None.   COMPLICATIONS: None.   DESCRIPTION OF PROCEDURE: The patient was brought to the operating room and placed supine on the operating table. The patient's leg had been signed prior to the procedure. The patient had the anesthesia placed by the anesthesiologist. The prep verification and incision time-outs were performed to confirm that this was the correct patient, site, side and location. The patient had an SCD on the opposite lower extremity. The patient did receive antibiotics prior to the incision and was re-dosed during the procedure as needed at indicated intervals. The patient was positioned on the fracture table with the table in traction and internal rotation to reduce the hip. The well leg was placed in a scissor position and all bony prominences were well-padded. The patient had the lower extremity prepped and draped in the standard surgical fashion. The incision was made 4 finger breadths superior to the greater trochanter. A guide pin was inserted into the tip of the greater trochanter under fluoroscopic guidance. An opening reamer was used to gain access to the femoral canal. The nail length was measured and inserted down the femoral canal to its proper depth. The appropriate  version of insertion for the lag screw was found under fluoroscopy. A pin was inserted up the femoral neck through the jig. The length of the lag screw was then measured. The lag screw was inserted as near to center-center in the head as possible. The leg was taken out of traction, then the compression screw was used to compress across the fracture. Compression was visualized on serial xrays.   We next turned our attention to the distal interlocking screw.  This was placed through the drill guide of the nail inserter.  A small incision was made overlying the lateral thigh at the screw site, and a tonsil was used to disect down to bone.  A drill pass was made through the jig and across the nail through both cortices.  This was measured, and the appropriate screw was placed under hand power and found to have good bite.    The wound was copiously irrigated with saline and the subcutaneous layer closed with 2.0 monocryl and the skin was reapproximated with staples. The wounds were cleaned and dried a final time and a sterile dressing was placed. The hip was taken through a range of motion at the end of the case under fluoroscopic imaging to visualize the approach-withdraw phenomenon and confirm implant length in the head. The patient was then awakened from anesthesia and taken to the recovery room in stable condition. All counts were correct at the end of the case.   POSTOPERATIVE PLAN: The patient will be weight bearing as tolerated and will return in 2 weeks for staple removal and the patient will receive DVT prophylaxis based on other medications,  activity level, and risk ratio of bleeding to thrombosis.  Her plan will be for bid 81 mg asa x 6 weeks and then back to her preoperative once per day dosage.   Geralynn Rile, MD [facility] [place] [phone] 5:14 PM

## 2018-11-19 NOTE — Brief Op Note (Signed)
11/19/2018  5:13 PM  PATIENT:  Enzo Montgomery  85 y.o. female  PRE-OPERATIVE DIAGNOSIS:  Right hip fx  POST-OPERATIVE DIAGNOSIS:  Right hip fx  PROCEDURE:  Procedure(s): INTRAMEDULLARY (IM) NAIL INTERTROCHANTRIC (Right)  SURGEON:  Surgeon(s) and Role:    * Nicholes Stairs, MD - Primary  PHYSICIAN ASSISTANT:   ASSISTANTS: none   ANESTHESIA:   general  EBL:  50 mL   BLOOD ADMINISTERED:none  DRAINS: none   LOCAL MEDICATIONS USED:  NONE  SPECIMEN:  No Specimen  DISPOSITION OF SPECIMEN:  N/A  COUNTS:  YES  TOURNIQUET:  * No tourniquets in log *  DICTATION: .Note written in EPIC  PLAN OF CARE: Admit to inpatient   PATIENT DISPOSITION:  PACU - hemodynamically stable.   Delay start of Pharmacological VTE agent (>24hrs) due to surgical blood loss or risk of bleeding: not applicable

## 2018-11-19 NOTE — Transfer of Care (Signed)
Immediate Anesthesia Transfer of Care Note  Patient: [name]  Procedure(s) Performed: INTRAMEDULLARY (IM) NAIL INTERTROCHANTRIC (Right Hip)  Patient Location: PACU  Anesthesia Type:General  Level of Consciousness: awake  Airway & Oxygen Therapy: Patient Spontanous Breathing and Patient connected to face mask oxygen  Post-op Assessment: Report given to RN and Post -op Vital signs reviewed and stable  Post vital signs: Reviewed and stable  Last Vitals:  Vitals Value Taken Time  BP 156/73 11/19/18 1723  Temp    Pulse 96 11/19/18 1725  Resp 19 11/19/18 1725  SpO2 99 % 11/19/18 1725  Vitals shown include unvalidated device data.  Last Pain:  Vitals:   11/19/18 1720  TempSrc:   PainSc: (P) Asleep         Complications: No apparent anesthesia complications

## 2018-11-19 NOTE — Anesthesia Procedure Notes (Signed)
Procedure Name: Intubation Date/Time: 11/19/2018 4:05 PM Performed by: Trinna Post., CRNA Pre-anesthesia Checklist: Patient identified, Emergency Drugs available, Suction available, Patient being monitored and Timeout performed Patient Re-evaluated:Patient Re-evaluated prior to induction Oxygen Delivery Method: Circle system utilized Preoxygenation: Pre-oxygenation with 100% oxygen Induction Type: IV induction, Rapid sequence and Cricoid Pressure applied Laryngoscope Size: Mac and 3 Grade View: Grade II Tube type: Oral Tube size: 7.0 mm Number of attempts: 1 Airway Equipment and Method: Stylet Placement Confirmation: ETT inserted through vocal cords under direct vision,  positive ETCO2 and breath sounds checked- equal and bilateral Secured at: 23 cm Tube secured with: Tape Dental Injury: Teeth and Oropharynx as per pre-operative assessment

## 2018-11-20 ENCOUNTER — Other Ambulatory Visit: Payer: Self-pay

## 2018-11-20 ENCOUNTER — Encounter (HOSPITAL_COMMUNITY): Payer: Self-pay | Admitting: Orthopedic Surgery

## 2018-11-20 MED ORDER — WHITE PETROLATUM EX OINT
TOPICAL_OINTMENT | CUTANEOUS | Status: AC
  Administered 2018-11-20: 0.2
  Filled 2018-11-20: qty 28.35

## 2018-11-20 MED ORDER — MENTHOL 3 MG MT LOZG
1.0000 | LOZENGE | OROMUCOSAL | Status: DC | PRN
  Administered 2018-11-20: 3 mg via ORAL
  Filled 2018-11-20 (×2): qty 9

## 2018-11-20 NOTE — Progress Notes (Signed)
Occupational Therapy Evaluation Patient Details Name: [name] MRN: [id number] DOB: 11/05/1928 Today's Date: 11/20/2018    History of Present Illness Mrs. Archie BalboaJolly is a 85 y.o. female with PMH of CAD s/p CABG, heart failure, CVA, HTN, HLD, depression, PVD s/p CEA and multiple other medical problems presenting after mechanical fall with R hip fracture.   Clinical Impression   Pt admitted for above diagnosis. PTA pt PLOF requiring assistance in ADLs with AE and lives alone. Pt is a poor historian, information retrieved from chart review. Pt currently requires assistance in ADLs due to weakness, pain, cognition, and limited function. Pt severely fearful of falling and requires +2 for functional transfers. Pt will benefit from acute OT to address deficits and to maximize independence prior to d/c at SNF level.     Follow Up Recommendations  SNF;Supervision/Assistance - 24 hour    Equipment Recommendations  (refer to next venue.)    Recommendations for Other Services       Precautions / Restrictions Precautions Precautions: None Restrictions Weight Bearing Restrictions: Yes RLE Weight Bearing: Weight bearing as tolerated      Mobility Bed Mobility Overal bed mobility: Needs Assistance Bed Mobility: Supine to Sit     Supine to sit: Total assist;+2 for physical assistance;+2 for safety/equipment;HOB elevated     General bed mobility comments: Pt required assistance with use of bed pad to transition from supine to sit due to pain, weakness, and confusion. Pt required verbal cues to places hands on bed for trunk support in sitting.   Transfers Overall transfer level: Needs assistance Equipment used: Rolling walker (2 wheeled) Transfers: Stand Pivot Transfers   Stand pivot transfers: Max assist;+2 physical assistance;+2 safety/equipment       General transfer comment: at the end of stand pivot transfer pt required chair to be placed directly behind her due to limited mobility  and pain.     Balance Overall balance assessment: Needs assistance Sitting-balance support: Bilateral upper extremity supported;Feet supported Sitting balance-Leahy Scale: Poor     Standing balance support: Bilateral upper extremity supported Standing balance-Leahy Scale: Zero Standing balance comment: reliant on RW                           ADL either performed or assessed with clinical judgement   ADL Overall ADL's : Needs assistance/impaired Eating/Feeding: Set up;Bed level   Grooming: Wash/dry hands;Wash/dry face;Oral care;Brushing hair;Set up;Bed level   Upper Body Bathing: Minimal assistance;Bed level   Lower Body Bathing: Maximal assistance   Upper Body Dressing : Minimal assistance;Bed level   Lower Body Dressing: Maximal assistance   Toilet Transfer: +2 for physical assistance;Maximal assistance;+2 for safety/equipment;Cueing for safety;Cueing for sequencing;Stand-pivot;Ambulation;RW Toilet Transfer Details (indicate cue type and reason): simulated transfer from bed to recliner. Pt required Max VCs and support of RLE to engage in stepping. Pt very fearful of falling and moves slowly.          Functional mobility during ADLs: Maximal assistance;+2 for physical assistance;+2 for safety/equipment;Cueing for safety;Cueing for sequencing;Rolling walker       Vision         Perception     Praxis      Pertinent Vitals/Pain       Hand Dominance     Extremity/Trunk Assessment Upper Extremity Assessment Upper Extremity Assessment: Generalized weakness   Lower Extremity Assessment Lower Extremity Assessment: Defer to PT evaluation   Cervical / Trunk Assessment Cervical / Trunk Assessment: Kyphotic  Communication Communication Communication: No difficulties   Cognition Arousal/Alertness: Suspect due to medications Behavior During Therapy: Restless;Flat affect Overall Cognitive Status: No family/caregiver present to determine baseline  cognitive functioning                                 General Comments: Pt confused of location and date. She reports hearing her daughter in the hall way suspecting she is in a SNF.   General Comments  Pt O2 levels low at start of session at 86% on 2L. Pt progressed to 90% while seated at EOB. In chair desat to 88% on RA.     Exercises     Shoulder Instructions      Home Living Family/patient expects to be discharged to:: Unsure Living Arrangements: Alone                               Additional Comments: Pt is a poor historian as mentioned in previous notes "Niece is only local relative who has kids, pt unsure if she would be able to stay with her". Pt report having a cane/walker and shower seat but reports needing assistance with ADLs due to limited function.      Prior Functioning/Environment Level of Independence: Needs assistance                 OT Problem List: Decreased activity tolerance;Impaired balance (sitting and/or standing);Decreased cognition;Decreased safety awareness;Decreased knowledge of use of DME or AE;Decreased knowledge of precautions;Pain      OT Treatment/Interventions:      OT Goals(Current goals can be found in the care plan section) Acute Rehab OT Goals Patient Stated Goal: goal not stated OT Goal Formulation: Patient unable to participate in goal setting Time For Goal Achievement: 12/04/18 Potential to Achieve Goals: Fair  OT Frequency: Min 2X/week   Barriers to D/C: Inaccessible home environment;Decreased caregiver support          Co-evaluation PT/OT/SLP Co-Evaluation/Treatment: Yes Reason for Co-Treatment: Complexity of the patient's impairments (multi-system involvement);For patient/therapist safety;Necessary to address cognition/behavior during functional activity   OT goals addressed during session: ADL's and self-care;Proper use of Adaptive equipment and DME      AM-PAC OT "6 Clicks" Daily Activity      Outcome Measure Help from another person eating meals?: A Little Help from another person taking care of personal grooming?: A Little Help from another person toileting, which includes using toliet, bedpan, or urinal?: A Lot Help from another person bathing (including washing, rinsing, drying)?: A Lot Help from another person to put on and taking off regular upper body clothing?: A Little Help from another person to put on and taking off regular lower body clothing?: A Lot 6 Click Score: 15   End of Session Equipment Utilized During Treatment: Gait belt;Rolling walker;Oxygen Nurse Communication: Mobility status;Weight bearing status  Activity Tolerance: Patient limited by pain;Patient limited by lethargy Patient left: in chair;with call bell/phone within reach;with chair alarm set  OT Visit Diagnosis: Unsteadiness on feet (R26.81);Muscle weakness (generalized) (M62.81);Repeated falls (R29.6);Pain Pain - Right/Left: Right Pain - part of body: Hip                Time: [id number] OT Time Calculation (min): 43 min Charges:  OT General Charges $OT Visit: 1 Visit OT Evaluation $OT Eval Moderate Complexity: 1 Mod OT Treatments $Self Care/Home Management : 23-37 mins  Alani Lacivita  Richardson Landry [place], OTR/L  Supplemental Rehabilitation Services  [phone]   Marius Ditch 11/20/2018, 11:54 AM

## 2018-11-20 NOTE — TOC Initial Note (Signed)
Transition of Care [facility]) - Initial/Assessment Note    Patient Details  Name: [name] MRN: [id number] Date of Birth: November 26, 1928  [facility]) CM/SW Contact:    Alberteen Sam, [place] Phone Number: [phone] 11/20/2018, 1:56 PM  Clinical Narrative:                  CSW notes patient currently oriented X1, consulted with patient's niece Joycelyn Schmid regarding discharge planning and PT recommendation of [place] as discharge plan for short term rehab. Joycelyn Schmid reports she believes this is an excellent idea as patient lives alone and keeps falling. She states she has been to [facility] twice in the past and would like patient to return there since she is familiar with the facility and staff, and they have a great rehab program. CSW will send referral to [facility] and informed Joycelyn Schmid on insurance authorization process, Joycelyn Schmid would like to be updated on when bed avail and when patient will discharge.   Expected Discharge Plan: Skilled Nursing Facility Barriers to Discharge: Continued Medical Work up   Patient Goals and CMS Choice   CMS Medicare.gov Compare Post Acute Care list provided to:: Patient Represenative (must comment)([name] (niece)) Choice offered to / list presented to : (Niece Joycelyn Schmid)  Expected Discharge Plan and Services Expected Discharge Plan: [place] Acute Care Choice: [place] Living arrangements for the past 2 months: Single Family Home Expected Discharge Date: (unknown)                                    Prior Living Arrangements/Services Living arrangements for the past 2 months: Single Family Home Lives with:: Self Patient language and need for interpreter reviewed:: Yes        Need for Family Participation in Patient Care: Yes (Comment) Care giver support system in place?: Yes (comment)   Criminal Activity/Legal Involvement Pertinent to Current Situation/Hospitalization:  No - Comment as needed  Activities of Daily Living Home Assistive Devices/Equipment: Cane (specify quad or straight), Eyeglasses, Walker (specify type)(single point cane, front wheeled walker, 4 wheeled walker with a seat) ADL Screening (condition at time of admission) Patient's cognitive ability adequate to safely complete daily activities?: Yes Is the patient deaf or have difficulty hearing?: No Does the patient have difficulty seeing, even when wearing glasses/contacts?: No Does the patient have difficulty concentrating, remembering, or making decisions?: Yes Patient able to express need for assistance with ADLs?: Yes Does the patient have difficulty dressing or bathing?: Yes Independently performs ADLs?: No Communication: Independent Dressing (OT): Needs assistance Is this a change from baseline?: Change from baseline, expected to last >3 days Grooming: Needs assistance Is this a change from baseline?: Change from baseline, expected to last >3 days Feeding: Needs assistance Is this a change from baseline?: Change from baseline, expected to last >3 days Bathing: Needs assistance Is this a change from baseline?: Change from baseline, expected to last >3 days Toileting: Dependent Is this a change from baseline?: Change from baseline, expected to last >3days In/Out Bed: Dependent Is this a change from baseline?: Change from baseline, expected to last >3 days Walks in Home: Dependent Is this a change from baseline?: Change from baseline, expected to last >3 days Does the patient have difficulty walking or climbing stairs?: Yes Weakness of Legs: Right Weakness of Arms/Hands: None  Permission Sought/Granted Permission sought to share information with :  Case Manager, Family Supports, Oceanographeracility Contact Representative Permission granted to share information with : Yes, Verbal Permission Granted  Share Information with NAME: [name]  Permission granted to share info w AGENCY:  SNFs  Permission granted to share info w Relationship: niece  Permission granted to share info w Contact Information: [phone]  Emotional Assessment Appearance:: Other (Comment Required(unable to assess) Attitude/Demeanor/Rapport: Unable to Assess Affect (typically observed): Unable to Assess Orientation: : Oriented to Self Alcohol / Substance Use: Not Applicable Psych Involvement: No (comment)  Admission diagnosis:  Closed displaced intertrochanteric fracture of right femur, initial encounter (HCC) [S72.141A] Hip fracture (HCC) [S72.009A] Patient Active Problem List   Diagnosis Date Noted  . Hip fracture (HCC) 11/18/2018  . Closed displaced intertrochanteric fracture of right femur (HCC)   . Fall at home, initial encounter 09/11/2018  . Generalized weakness 09/11/2018  . UTI (urinary tract infection) 09/10/2018  . Tachycardia   . PAF (paroxysmal atrial fibrillation) (HCC)   . Malnutrition of moderate degree 09/06/2017  . Multiple rib fractures 09/04/2017  . Dyspnea 01/21/2014  . Chronic diastolic CHF (congestive heart failure) (HCC) 12/15/2013  . Cerebral vascular disease 10/13/2013  . Mixed incontinence urge and stress 04/22/2013  . Nocturia 04/22/2013  . Angina decubitus (HCC) 03/12/2013  . GERD (gastroesophageal reflux disease) 08/16/2011  . Other general symptoms  08/16/2011  . Fatigue 09/04/2010  . HEPATITIS, ACUTE 07/12/2009  . AMI 07/12/2009  . ESOPHAGEAL REFLUX 07/12/2009  . DIVERTICULOSIS, COLON 07/12/2009  . CHEST PAIN 07/12/2009  . SLEEP APNEA 10/23/2007  . Essential hypertension 09/18/2007  . CAD (coronary artery disease) 09/18/2007  . CHEST PAIN, ATYPICAL 09/18/2007   PCP:  Donita BrooksPickard, Warren T, MD Pharmacy:   [facility] [address] - [place], [place] [id number] [facility] [address] [place] [address] Phone: [phone] Fax: [phone]     Social Determinants of Health (SDOH) Interventions    Readmission Risk  Interventions No flowsheet data found.

## 2018-11-20 NOTE — Progress Notes (Signed)
PROGRESS NOTE  [name] DasLou B Casella ZOX:[id number] DOB: April 09, 1929 DOA: 11/18/2018 PCP: Donita BrooksPickard, Warren T, MD  Brief History   [name] is a 85 y.o. female with medical history significant of CAD s/p CABG, heart failure, CVA, HTN, HLD, depression, PVD s/p CEA and multiple other medical problems presenting after mechanical fall with R hip fracture.  History is limited as pt recently received morphine.  Hx is obtained from pt, medical record, and niece.  Pt notes she fell last night when walking to bathroom to get water.  She notes her cane slipped from under her.  She did hit her head, but no LOC.  Hit R side.  She notes that she was on the ground all night until family found her.  No LH or dizziness, no HA or CP.  She hit her R wrist as well.  Per discussion with her niece, [name], she was found down in the den and her story did not quite match where she was found in the morning.  Some ? About the story.  Per nursing, pt was a bit more confused when I spoke with her, but was A&Ox4 before receiving morphine.  Denies smoking and etoh.  Pt lives alone.  Her niece visits her 3x weekly and family and friends bring food.  She gets around with a cane/walker, but often doesn't use assistive device in the home.   [facility] were consulted to admit the patient for acute fracture of her right hip. Orthopedic surgery was consulted. The patient underwent intramedullary nailing of her right femur yesterday.  Consultants  . Orthopedic surgery  Procedures  . None  Antibiotics   Anti-infectives (From admission, onward)   Start     Dose/Rate Route Frequency Ordered Stop   11/20/18 0600  ceFAZolin (ANCEF) IVPB 2g/100 mL premix     2 g 200 mL/hr over 30 Minutes Intravenous On call to O.R. 11/19/18 1535 11/19/18 1637   11/19/18 2200  ceFAZolin (ANCEF) IVPB 1 g/50 mL premix     1 g 100 mL/hr over 30 Minutes Intravenous Every 6 hours 11/19/18 1900 11/20/18 0855   11/19/18 1534  ceFAZolin (ANCEF) 2-4  GM/100ML-% IVPB    Note to Pharmacy: Sandi RavelingSchonewitz, Leigh   : cabinet override      11/19/18 1534 11/19/18 1607     .   Subjective  The patient is sitting up in a chair at bedside. No new complaints.   Objective   Vitals:  Vitals:   11/20/18 0800 11/20/18 1500  BP: (!) 116/46 (!) 125/55  Pulse: 78 80  Resp: 18 17  Temp: 97.6 F (36.4 C) 98.1 F (36.7 C)  SpO2: (!) 89% 96%    Exam:  Constitutional:  . The patient is elderly, weak, and frail. She is awake, alert, and oriented x 3. No acute distress. Respiratory:  . No increased work of breathing. . No wheezes, rales, or rhonchi. . No tactile fremitus Cardiovascular:  . Regular rate and rhythm . No murmurs, ectopy, or gallups. . No lateral PMI. No thrills. Abdomen:  . Abdomen is soft, non-tender, non-distended. . No hernias, masses, or organomegaly is appreciated. . Normoactive bowel sounds. Musculoskeletal:  . No cyanosis, clubbing or edema. Peri [name]. Good capillary refill. Skin:  . No rashes, lesions, ulcers . palpation of skin: no induration or nodules Neurologic:  . CN 2-12 intact . Sensation all 4 extremities intact Psychiatric:  . Mental status o Mood, affect appropriate o Orientation to person, place, time  . judgment  and insight  - I am unable to judge these at this time due to somnolence and pain medications.   I have personally reviewed the following:   Today's Data  . Orthoptist  . PCR for MRSA negative   Scheduled Meds: . acetaminophen  1,000 mg Oral Q8H  . docusate sodium  100 mg Oral BID  . isosorbide mononitrate  60 mg Oral Daily  . mupirocin ointment  1 application Nasal BID  . nebivolol  10 mg Oral Daily  . pantoprazole  40 mg Oral Daily  . polyethylene glycol  17 g Oral Daily  . potassium chloride  10 mEq Oral Daily  . rosuvastatin  20 mg Oral Daily   Continuous Infusions: . lactated ringers 10 mL/hr at 11/20/18 4163    Active Problems:   Hip fracture ([place])   LOS: 2 days    A & P   Right Hip Fracture  Mechanical Fall: Pt reports cane slipped out from underneath her while walking.  No CP, LH, SOB, LOC.  X-ray demonstrated a right hip fracture. Head/maxilloface/neck CT without acute abnormality, rib films negative, plain films of R forearm, humerus, elbow negative. Discussed with pt and family (niece, [name]) at admission by admitting doctor, RCRI 3 (hx CAD, HF, stroke), risk of MACE ~15%.  Risk of surgery is not insignificant, but I think benefit of surgical fixation of hip fracture likely outweighs the risk.  She was living independently prior to this admission for her hip fracture.  No evidence of HF on exam or murmur.  Low risk stress test in 2016.  Echo in 2017 with grade 1 diastolic dysfunction and normal EF.  I do not believe additional cardiac testing would change management at this time. The patient is receiving APA and oxycodone as needed for pain. She has IV morphine for breakthrough pain. She is on a bowel regimen. DVT prophylaxis is SCD's. The patient underwent intramedullary naililng of right hip on 11/19/2018. She has tolerated the procedure well.  Rhabdomyolysis: Elevated CK. Will follow. It is unknown how long she may have been down.  Acute Encephalopathy: 2/2 morphine, will try lower dose.  Delirium precautions.  CAD s/p CABG in 1997 and 2010: pt asx.  Continue bystolic, crestor, imdur.  She reports to me she's not taking ASA anymore, but can't say why.  Hx HFrEF  HFpEF: EF improved on 2017 echo with EF of 55 - 60% at that time with grade one diastolic dysfunction.  Appears euvolemic.  Follow.  HTN: Blood pressures are well controlled. Continue bystolic and imdur as at home.  Hx CVA: Noted. Continue crestor, pt has ASA on med list, but says she hasn't taken it recently  Hx PVD s/p R carotid endarterectomy: Noted. Continue ASA, Crestor. Monitor blood pressure.  Leukocytosis: likely reactive, afebrile.  Follow.  I have seen and examined  this patient myself. I have spent 30 minutes in her evaluation and care.  COVID19 test: Negative DVT prophylaxis: SCD Code Status: DNR  Family Communication: None available Disposition Plan: Will require SNF.  Azia Toutant, DO [facility] Direct contact: see [email or web address]  7PM-7AM contact night coverage as above  11/20/2018, 4:09 PM  LOS: 3 day

## 2018-11-20 NOTE — Evaluation (Signed)
Physical Therapy Evaluation Patient Details Name: DEBORH PENSE MRN: [id number] DOB: 02/25/1929 Today's Date: 11/20/2018   History of Present Illness  Mrs. Longwell is a 85 y.o. female with PMH of CAD s/p CABG, heart failure, CVA, HTN, HLD, depression, PVD s/p CEA and multiple other medical problems presenting after mechanical fall with R hip fracture.  Clinical Impression  Patient received in bed, patient reporting significant pain in right LE. Pain medicine given about 1 hour prior. Patient confused to place and situation. Agreeable to work with PT/OT. Requires max/total +2 assistance for bed mobility this visit due to pain and fear of pain. Patient requires mod +2 assist for sit to stand. She was able to take two steps with right LE but difficulty advancing L LE due to pain and inability to effectively use B UEs for support on walker. Assisted into recliner with chair being brought to patient due to inability to continued moving. Patient will benefit from skilled PT to address her decreased ambulation, decreased strength, and pain.       Follow Up Recommendations SNF    Equipment Recommendations  Other (comment)(can be determined at next venue)    Recommendations for Other Services       Precautions / Restrictions Precautions Precautions: Fall Restrictions Weight Bearing Restrictions: Yes RLE Weight Bearing: Weight bearing as tolerated      Mobility  Bed Mobility Overal bed mobility: Needs Assistance Bed Mobility: Supine to Sit     Supine to sit: +2 for physical assistance;Total assist;HOB elevated;+2 for safety/equipment     General bed mobility comments: Pt required assistance with use of bed pad to transition from supine to sit due to pain, weakness, and confusion. Pt required verbal cues to places hands on bed for trunk support in sitting.   Transfers Overall transfer level: Needs assistance Equipment used: Rolling walker (2 wheeled) Transfers: Sit to/from Stand Sit to  Stand: +2 physical assistance;+2 safety/equipment;Mod assist Stand pivot transfers: Max assist;+2 physical assistance;+2 safety/equipment       General transfer comment: at the end of stand pivot transfer pt required chair to be placed directly behind her due to limited mobility and pain.   Ambulation/Gait Ambulation/Gait assistance: Mod assist;+2 physical assistance;+2 safety/equipment Gait Distance (Feet): 2 Feet Assistive device: Rolling walker (2 wheeled) Gait Pattern/deviations: Step-to pattern;Decreased step length - right;Decreased step length - left;Decreased stance time - right;Decreased weight shift to right;Antalgic Gait velocity: decreased   General Gait Details: patient able to take 2 good steps with right LE with max cues. When trying to weight shift onto right she is unable to despite max cues to use arms for support. Difficulty following cues, pain limited.  Stairs            Wheelchair Mobility    Modified Rankin (Stroke Patients Only)       Balance Overall balance assessment: Needs assistance Sitting-balance support: Bilateral upper extremity supported;Feet supported Sitting balance-Leahy Scale: Poor   Postural control: Posterior lean Standing balance support: Bilateral upper extremity supported Standing balance-Leahy Scale: Poor Standing balance comment: reliant on RW                             Pertinent Vitals/Pain Pain Assessment: Faces Faces Pain Scale: Hurts whole lot Pain Location: R hip Pain Descriptors / Indicators: Operative site guarding;Aching;Grimacing;Guarding Pain Intervention(s): Limited activity within patient's tolerance;Repositioned;Monitored during session;Premedicated before session    Home Living Family/patient expects to be discharged to:: Skilled nursing  facility Living Arrangements: Alone               Additional Comments: Pt is a poor historian as mentioned in previous notes "Niece is only local relative  who has kids, pt unsure if she would be able to stay with her". Pt report having a cane/walker and shower seat but reports needing assistance with ADLs due to limited function.    Prior Function Level of Independence: Independent with assistive device(s)   Gait / Transfers Assistance Needed: living alone, however had multiple falls  ADL's / Homemaking Assistance Needed: independent  Comments: ambulatory at home, however reports mulitple falls     Hand Dominance   Dominant Hand: Right    Extremity/Trunk Assessment   Upper Extremity Assessment Upper Extremity Assessment: Defer to OT evaluation    Lower Extremity Assessment Lower Extremity Assessment: Generalized weakness;RLE deficits/detail RLE Deficits / Details: fracture RLE: Unable to fully assess due to pain RLE Sensation: WNL RLE Coordination: decreased gross motor    Cervical / Trunk Assessment Cervical / Trunk Assessment: Kyphotic  Communication   Communication: No difficulties  Cognition Arousal/Alertness: Awake/alert Behavior During Therapy: WFL for tasks assessed/performed Overall Cognitive Status: No family/caregiver present to determine baseline cognitive functioning                                 General Comments: Pt confused of location and date. She reports hearing her neice in the hall way suspecting she is in a SNF.      General Comments General comments (skin integrity, edema, etc.): Pt O2 levels low at start of session at 86% on 2L. Pt progressed to 90% while seated at EOB. In chair desat to 88% on RA.     Exercises Total Joint Exercises Ankle Circles/Pumps: AROM;Supine;5 reps;Both   Assessment/Plan    PT Assessment Patient needs continued PT services  PT Problem List Decreased strength;Decreased mobility;Decreased safety awareness;Decreased coordination;Decreased knowledge of precautions;Decreased activity tolerance;Decreased cognition;Decreased balance;Decreased knowledge of use of  DME;Pain;Decreased range of motion       PT Treatment Interventions DME instruction;Therapeutic activities;Gait training;Therapeutic exercise;Patient/family education;Balance training;Functional mobility training;Neuromuscular re-education    PT Goals (Current goals can be found in the Care Plan section)  Acute Rehab PT Goals Patient Stated Goal: no goals stated PT Goal Formulation: With patient Time For Goal Achievement: 12/04/18 Potential to Achieve Goals: Fair    Frequency Min 3X/week   Barriers to discharge Decreased caregiver support      Co-evaluation PT/OT/SLP Co-Evaluation/Treatment: Yes Reason for Co-Treatment: Complexity of the patient's impairments (multi-system involvement);For patient/therapist safety;Necessary to address cognition/behavior during functional activity PT goals addressed during session: Mobility/safety with mobility;Balance;Proper use of DME OT goals addressed during session: ADL's and self-care;Proper use of Adaptive equipment and DME       AM-PAC PT "6 Clicks" Mobility  Outcome Measure Help needed turning from your back to your side while in a flat bed without using bedrails?: Total Help needed moving from lying on your back to sitting on the side of a flat bed without using bedrails?: Total Help needed moving to and from a bed to a chair (including a wheelchair)?: Total Help needed standing up from a chair using your arms (e.g., wheelchair or bedside chair)?: Total Help needed to walk in hospital room?: Total Help needed climbing 3-5 steps with a railing? : Total 6 Click Score: 6    End of Session Equipment Utilized During Treatment: Gait  belt;Oxygen Activity Tolerance: Patient limited by pain;Patient limited by fatigue Patient left: in chair;with chair alarm set;with call bell/phone within reach Nurse Communication: Mobility status PT Visit Diagnosis: Other abnormalities of gait and mobility (R26.89);Muscle weakness (generalized)  (M62.81);Difficulty in walking, not elsewhere classified (R26.2);Pain;Repeated falls (R29.6) Pain - Right/Left: Right Pain - part of body: Hip    Time: 1005-1040 PT Time Calculation (min) (ACUTE ONLY): 35 min   Charges:   PT Evaluation $PT Eval Moderate Complexity: 1 Mod PT Treatments $Gait Training: 8-22 mins $Therapeutic Activity: 8-22 mins        Keylon Labelle, PT, GCS 11/20/18,12:25 PM

## 2018-11-20 NOTE — NC FL2 (Signed)
[place]  MEDICAID FL2 LEVEL OF CARE SCREENING TOOL     IDENTIFICATION  Patient Name: [name] Birthdate: 08-25-1928 Sex: female Admission Date (Current Location): 11/18/2018  [facility] [place]   Facility and Address:  [facility], [address], [place], [place] [address]      Provider Number: [id number]  Attending Physician Name and Address:  Fran LowesSwayze, Ava, DO  Relative Name and Phone Number:  [name] (niece)[phone]    Current Level of Care: Hospital Recommended Level of Care: Skilled Nursing Facility Prior Approval Number:    Date Approved/Denied: 09/05/17 PASRR Number: [id number]  Discharge Plan: SNF    Current Diagnoses: Patient Active Problem List   Diagnosis Date Noted  . Hip fracture (HCC) 11/18/2018  . Closed displaced intertrochanteric fracture of right femur (HCC)   . Fall at home, initial encounter 09/11/2018  . Generalized weakness 09/11/2018  . UTI (urinary tract infection) 09/10/2018  . Tachycardia   . PAF (paroxysmal atrial fibrillation) (HCC)   . Malnutrition of moderate degree 09/06/2017  . Multiple rib fractures 09/04/2017  . Dyspnea 01/21/2014  . Chronic diastolic CHF (congestive heart failure) (HCC) 12/15/2013  . Cerebral vascular disease 10/13/2013  . Mixed incontinence urge and stress 04/22/2013  . Nocturia 04/22/2013  . Angina decubitus (HCC) 03/12/2013  . GERD (gastroesophageal reflux disease) 08/16/2011  . Other general symptoms  08/16/2011  . Fatigue 09/04/2010  . HEPATITIS, ACUTE 07/12/2009  . AMI 07/12/2009  . ESOPHAGEAL REFLUX 07/12/2009  . DIVERTICULOSIS, COLON 07/12/2009  . CHEST PAIN 07/12/2009  . SLEEP APNEA 10/23/2007  . Essential hypertension 09/18/2007  . CAD (coronary artery disease) 09/18/2007  . CHEST PAIN, ATYPICAL 09/18/2007    Orientation RESPIRATION BLADDER Height & Weight     Self, Time  O2(nasal cannula 2L/min) Incontinent, External catheter Weight:    Height:     BEHAVIORAL SYMPTOMS/MOOD NEUROLOGICAL BOWEL NUTRITION STATUS      Continent Diet(see discharge summary)  AMBULATORY STATUS COMMUNICATION OF NEEDS Skin   Extensive Assist Verbally (right hip closed surgical wound, shoulder open/dehisced wound)                       Personal Care Assistance Level of Assistance  Bathing, Feeding, Dressing, Total care Bathing Assistance: Limited assistance Feeding assistance: Independent Dressing Assistance: Limited assistance Total Care Assistance: Maximum assistance   Functional Limitations Info  Sight, Hearing, Speech Sight Info: Adequate Hearing Info: Adequate Speech Info: Adequate    SPECIAL CARE FACTORS FREQUENCY  PT (By licensed PT), OT (By licensed OT)     PT Frequency: min 5x weekly OT Frequency: min 5x weekly            Contractures Contractures Info: Not present    Additional Factors Info  Code Status, Allergies Code Status Info: DNR Allergies Info: Metoprolol Phenergan (Promethazine Hcl) Prochlorperazine Edisylate Zetia (Ezetimibe) Compazine (Prochlorperazine) Sulfur Niacin Sulfonamide Derivatives           Current Medications (11/20/2018):  This is the current hospital active medication list Current Facility-Administered Medications  Medication Dose Route Frequency Provider Last Rate Last Dose  . acetaminophen (TYLENOL) tablet 1,000 mg  1,000 mg Oral Q8H Yolonda Kidaogers, Jason Patrick, MD   1,000 mg at 11/20/18 0820  . docusate sodium (COLACE) capsule 100 mg  100 mg Oral BID Yolonda Kidaogers, Jason Patrick, MD   100 mg at 11/20/18 [phone]  . isosorbide mononitrate (IMDUR) 24 hr tablet 60 mg  60 mg Oral Daily Aundria Rudogers,  Elly Modena, MD   60 mg at 11/20/18 6761  . lactated ringers infusion   Intravenous Continuous Nicholes Stairs, MD 10 mL/hr at 11/20/18 [phone]    . menthol-cetylpyridinium (CEPACOL) lozenge 3 mg  1 lozenge Oral PRN Nicholes Stairs, MD      . metoCLOPramide [facility]) tablet 5-10 mg  5-10 mg Oral Q8H PRN  Nicholes Stairs, MD       Or  . metoCLOPramide [facility]) injection 5-10 mg  5-10 mg Intravenous Q8H PRN Nicholes Stairs, MD      . morphine 2 MG/ML injection 1 mg  1 mg Intravenous Q3H PRN Nicholes Stairs, MD   1 mg at 11/19/18 0953  . mupirocin ointment (BACTROBAN) 2 % 1 application  1 application Nasal BID Nicholes Stairs, MD   1 application at 32/67/12 1000  . nebivolol (BYSTOLIC) tablet 10 mg  10 mg Oral Daily Nicholes Stairs, MD   10 mg at 11/20/18 1000  . ondansetron (ZOFRAN) tablet 4 mg  4 mg Oral Q6H PRN Nicholes Stairs, MD       Or  . ondansetron [facility]) injection 4 mg  4 mg Intravenous Q6H PRN Nicholes Stairs, MD      . oxyCODONE (Oxy IR/ROXICODONE) immediate release tablet 2.5 mg  2.5 mg Oral Q4H PRN Nicholes Stairs, MD       Or  . oxyCODONE (Oxy IR/ROXICODONE) immediate release tablet 5 mg  5 mg Oral Q4H PRN Nicholes Stairs, MD   5 mg at 11/20/18 4580  . pantoprazole (PROTONIX) EC tablet 40 mg  40 mg Oral Daily Nicholes Stairs, MD   40 mg at 11/20/18 1000  . polyethylene glycol (MIRALAX / GLYCOLAX) packet 17 g  17 g Oral Daily Nicholes Stairs, MD   17 g at 11/20/18 1000  . potassium chloride (K-DUR) CR tablet 10 mEq  10 mEq Oral Daily Nicholes Stairs, MD   10 mEq at 11/20/18 9983  . rosuvastatin (CRESTOR) tablet 20 mg  20 mg Oral Daily Nicholes Stairs, MD   20 mg at 11/20/18 3825     Discharge Medications: Please see discharge summary for a list of discharge medications.  Relevant Imaging Results:  Relevant Lab Results:   Additional Information SSN: [id number]  Alberteen Sam, LCSW

## 2018-11-20 NOTE — Progress Notes (Signed)
   Subjective:  Patient reports pain as moderate to severe.  Had a "rough night."  Denies SOB/CP/N/V  Objective:   VITALS:   Vitals:   11/19/18 1845 11/20/18 0022 11/20/18 0515 11/20/18 0800  BP: (!) 124/48 (!) 116/49 (!) 126/58 (!) 116/46  Pulse: 87 86 86 78  Resp: 16 20 15 18   Temp: 97.7 F (36.5 C) 98.4 F (36.9 C) 98.1 F (36.7 C) 97.6 F (36.4 C)  TempSrc: Oral Oral Oral Oral  SpO2: 94% 97% 93% (!) 89%    Neurologically intact Intact pulses distally Dorsiflexion/Plantar flexion intact Incision: dressing C/D/I  RUE' ttp at the shoulder but NVI and no ecchymosis or wounds.  Lab Results  Component Value Date   WBC 14.7 (H) 11/19/2018   HGB 12.1 11/19/2018   HCT 36.5 11/19/2018   MCV 92.6 11/19/2018   PLT 146 (L) 11/19/2018   BMET    Component Value Date/Time   NA 138 11/19/2018 0451   NA 141 10/19/2016 1448   K 4.4 11/19/2018 0451   CL 104 11/19/2018 0451   CO2 23 11/19/2018 0451   GLUCOSE 108 (H) 11/19/2018 0451   BUN 19 11/19/2018 0451   BUN 15 10/19/2016 1448   CREATININE 0.94 11/19/2018 0451   CREATININE 0.88 04/24/2017 1037   CALCIUM 9.2 11/19/2018 0451   GFRNONAA 55 (L) 11/19/2018 0451   GFRNONAA 70 06/28/2015 1629   GFRAA >60 11/19/2018 0451   GFRAA 81 06/28/2015 1629     Assessment/Plan: 1 Day Post-Op   Active Problems:   Hip fracture (HCC)   Up with therapy WBAT to RUE and RLE I reviewed xrays with patient and discussed no fractures of the RUE.  ICE to shoulder PRN  Maintain post op bandage until follow up appointment with me in 2 weeks  Ok to shower over dressing  Bid 81 mg asa for DVT ppx x 6 weeks, with SCDs while in [place] 11/20/2018, 8:43 AM   Geralynn Rile, MD [phone]

## 2018-11-20 NOTE — Progress Notes (Signed)
In to see pt. Pt alert, c/o pain and thirst. Mittens removed this am upon initial introduction at 0730. Pt was given some juice and an ensure. Pt c/o throat hurting and feeling "dehydrated, look at my arms". IV fluid rate increased for 1 bag of fluid.

## 2018-11-21 ENCOUNTER — Other Ambulatory Visit: Payer: Self-pay

## 2018-11-21 DIAGNOSIS — I5043 Acute on chronic combined systolic (congestive) and diastolic (congestive) heart failure: Secondary | ICD-10-CM

## 2018-11-21 DIAGNOSIS — T796XXD Traumatic ischemia of muscle, subsequent encounter: Secondary | ICD-10-CM

## 2018-11-21 DIAGNOSIS — G9341 Metabolic encephalopathy: Secondary | ICD-10-CM

## 2018-11-21 DIAGNOSIS — I2510 Atherosclerotic heart disease of native coronary artery without angina pectoris: Secondary | ICD-10-CM

## 2018-11-21 LAB — CBC WITH DIFFERENTIAL/PLATELET
Abs Immature Granulocytes: 0.06 10*3/uL (ref 0.00–0.07)
Basophils Absolute: 0 10*3/uL (ref 0.0–0.1)
Basophils Relative: 0 %
Eosinophils Absolute: 0 10*3/uL (ref 0.0–0.5)
Eosinophils Relative: 0 %
HCT: 32.4 % — ABNORMAL LOW (ref 36.0–46.0)
Hemoglobin: 10.8 g/dL — ABNORMAL LOW (ref 12.0–15.0)
Immature Granulocytes: 0 %
Lymphocytes Relative: 11 %
Lymphs Abs: 1.9 10*3/uL (ref 0.7–4.0)
MCH: 30.6 pg (ref 26.0–34.0)
MCHC: 33.3 g/dL (ref 30.0–36.0)
MCV: 91.8 fL (ref 80.0–100.0)
Monocytes Absolute: 1.5 10*3/uL — ABNORMAL HIGH (ref 0.1–1.0)
Monocytes Relative: 9 %
Neutro Abs: 13.9 10*3/uL — ABNORMAL HIGH (ref 1.7–7.7)
Neutrophils Relative %: 80 %
Platelets: 166 10*3/uL (ref 150–400)
RBC: 3.53 MIL/uL — ABNORMAL LOW (ref 3.87–5.11)
RDW: 12.7 % (ref 11.5–15.5)
WBC: 17.5 10*3/uL — ABNORMAL HIGH (ref 4.0–10.5)
nRBC: 0 % (ref 0.0–0.2)

## 2018-11-21 LAB — BASIC METABOLIC PANEL
Anion gap: 8 (ref 5–15)
BUN: 31 mg/dL — ABNORMAL HIGH (ref 8–23)
CO2: 25 mmol/L (ref 22–32)
Calcium: 8.8 mg/dL — ABNORMAL LOW (ref 8.9–10.3)
Chloride: 103 mmol/L (ref 98–111)
Creatinine, Ser: 0.8 mg/dL (ref 0.44–1.00)
GFR calc Af Amer: >60 mL/min (ref >60)
GFR calc non Af Amer: >60 mL/min (ref >60)
Glucose, Bld: 154 mg/dL — ABNORMAL HIGH (ref 70–99)
Potassium: 4.7 mmol/L (ref 3.5–5.1)
Sodium: 136 mmol/L (ref 135–145)

## 2018-11-21 LAB — GLUCOSE, CAPILLARY: Glucose-Capillary: 113 mg/dL — ABNORMAL HIGH (ref 70–99)

## 2018-11-21 MED ORDER — OXYCODONE HCL 5 MG PO TABS: 5.0000 mg | ORAL_TABLET | ORAL | 0 refills | Status: AC | PRN

## 2018-11-21 MED ORDER — DOCUSATE SODIUM 100 MG PO CAPS: 100.0000 mg | ORAL_CAPSULE | Freq: Two times a day (BID) | ORAL | 0 refills | Status: AC

## 2018-11-21 NOTE — Progress Notes (Signed)
Physical Therapy Treatment Patient Details Name: [name] MRN: [id number] DOB: [birth year] Today's Date: 11/21/2018    History of Present Illness [name] is a 85 y.o. female with PMH of CAD s/p CABG, heart failure, CVA, HTN, HLD, depression, PVD s/p CEA and multiple other medical problems presenting after mechanical fall with R hip fracture.    PT Comments    Pt performed functional mobility during session with max assistance.  Pt participated in B LE strengthening.  Pt remains guarded due to pain in B LEs.  Pt unable to stand fully upright but transferred OOB to eat her breakfast tray.  Pt SPo2 decreased to 87% on RA, placed on 2L [place] post session.  Pt tolerated treatment but remains limited due to pain.  Continue to recommend SNF at d/c.     Follow Up Recommendations  SNF     Equipment Recommendations  Other (comment)(TBD at next venue)    Recommendations for Other Services       Precautions / Restrictions Precautions Precautions: Fall Restrictions Weight Bearing Restrictions: Yes RLE Weight Bearing: Weight bearing as tolerated    Mobility  Bed Mobility Overal bed mobility: Needs Assistance Bed Mobility: Supine to Sit     Supine to sit: Total assist     General bed mobility comments: PTA used bed pad to move hips and LEs to edge of bed with total assistance.  Pt attempted to engage in trunk elevation but required assistance to elevate trunk into seated position.  Transfers Overall transfer level: Needs assistance Equipment used: Ambulation equipment used(sara stedy) Transfers: Sit to/from Stand Sit to Stand: Max assist         General transfer comment: Pt required hand over hand placement to place hand on cross bar of sara stedy.  pt able to initiate weight shifting forward but unable to achieve full standing.  presents with flexed knees and hips and trunk but up enough off her bottom to place stedy plates for pivot from bed to recliner  chair.  Ambulation/Gait Ambulation/Gait assistance: (NT unable to stand upright for progression to gait training.)               Stairs             Wheelchair Mobility    Modified Rankin (Stroke Patients Only)       Balance Overall balance assessment: Needs assistance   Sitting balance-Leahy Scale: Poor   Postural control: Posterior lean   Standing balance-Leahy Scale: Poor Standing balance comment: reliant on external assistance and BUE support.                            Cognition Arousal/Alertness: Awake/alert Behavior During Therapy: WFL for tasks assessed/performed Overall Cognitive Status: No family/caregiver present to determine baseline cognitive functioning                                 General Comments: Pt confused of location and date. She reports being outside and it snowing.      Exercises General Exercises - Lower Extremity Ankle Circles/Pumps: AAROM;Both;10 reps;Supine Heel Slides: AAROM;Both;10 reps;Supine Hip ABduction/ADduction: AAROM;Both;10 reps;Supine    General Comments        Pertinent Vitals/Pain Pain Assessment: Faces Faces Pain Scale: Hurts whole lot Pain Location: R hip Pain Descriptors / Indicators: Operative site guarding;Aching;Grimacing;Guarding Pain Intervention(s): Monitored during session;Repositioned;Ice applied    Home Living  Prior Function            PT Goals (current goals can now be found in the care plan section) Acute Rehab PT Goals Patient Stated Goal: To eat breakfast Potential to Achieve Goals: Fair Progress towards PT goals: Progressing toward goals    Frequency    Min 3X/week      PT Plan Current plan remains appropriate    Co-evaluation              AM-PAC PT "6 Clicks" Mobility   Outcome Measure  Help needed turning from your back to your side while in a flat bed without using bedrails?: Total Help needed moving from  lying on your back to sitting on the side of a flat bed without using bedrails?: Total Help needed moving to and from a bed to a chair (including a wheelchair)?: Total Help needed standing up from a chair using your arms (e.g., wheelchair or bedside chair)?: Total Help needed to walk in hospital room?: Total Help needed climbing 3-5 steps with a railing? : Total 6 Click Score: 6    End of Session Equipment Utilized During Treatment: Oxygen;Other (comment)(sara stedy)   Patient left: in chair;with chair alarm set;with call bell/phone within reach Nurse Communication: Mobility status;Need for lift equipment(sara stedy) PT Visit Diagnosis: Other abnormalities of gait and mobility (R26.89);Muscle weakness (generalized) (M62.81);Difficulty in walking, not elsewhere classified (R26.2);Pain;Repeated falls (R29.6) Pain - Right/Left: Right Pain - part of body: Hip     Time: [id number] PT Time Calculation (min) (ACUTE ONLY): 38 min  Charges:  $Therapeutic Exercise: 8-22 mins $Therapeutic Activity: 23-37 mins                     Joycelyn RuaAimee Zoraya Fiorenza, PTA Acute Rehabilitation Services Pager [phone] Office [phone]     Keyshla Tunison Artis DelayJ Alyzah Pelly 11/21/2018, 11:41 AM

## 2018-11-21 NOTE — Care Management Important Message (Signed)
Important Message  Patient Details  Name: [name] MRN: [id number] Date of Birth: February 22, 1929   Medicare Important Message Given:  Yes     Memory Argue 11/21/2018, 3:38 PM

## 2018-11-21 NOTE — Plan of Care (Signed)

## 2018-11-21 NOTE — Progress Notes (Signed)
Called facility, gave report to nurse Judeen Hammans, all questions addressed, Pt not in distress, updated Joycelyn Schmid regarding pending discharge. Pt to discharge to facility via [facility] with belongings.

## 2018-11-21 NOTE — TOC Transition Note (Addendum)
Transition of Care [facility]) - CM/SW Discharge Note   Patient Details  Name: ANEDRA PENAFIEL MRN: [id number] Date of Birth: 1928-10-17  [facility] CM/SW Contact:  Alberteen Sam, LCSW Phone Number: 11/21/2018, 3:33 PM   Clinical Narrative:     Patient will DC to: [facility] Anticipated DC date: 11/21/2018 Family notified: Joycelyn Schmid Transport by: Corey Harold  Per MD patient ready for DC to [facility] . RN, patient, patient's family, and facility notified of DC. Discharge Summary sent to facility. RN given number for report [phone] Room [phone]  . DC packet on chart. Ambulance transport requested for patient.  CSW signing off.  [place], [place]   Final next level of care: Skilled Nursing Facility Barriers to Discharge: No Barriers Identified   Patient Goals and CMS Choice   CMS Medicare.gov Compare Post Acute Care list provided to:: Patient Represenative (must comment)([name] (niece)) Choice offered to / list presented to : (niece Joycelyn Schmid)  Discharge Placement PASRR number recieved: 11/20/18            Patient chooses bed at: [facility] Patient to be transferred to facility by: [place] Name of family member notified: Margarete Patient and family notified of of transfer: 11/21/18  Discharge Plan and Services     Post Acute Care Choice: [place]                               Social Determinants of Health (SDOH) Interventions     Readmission Risk Interventions No flowsheet data found.

## 2018-11-21 NOTE — Progress Notes (Signed)
[facility] has insurance auth. Patient can discharge when medically ready. Please notify CSW thank you.   [place], [place]

## 2018-11-21 NOTE — Plan of Care (Signed)

## 2018-11-21 NOTE — Progress Notes (Signed)
Pt has poor PO intake, refused to eat lunch. MD updated. Pt transferred to bed, complaining of pain, scheduled tylenol given. Pt now asleep, Joycelyn Schmid (niece) called and updated of Pt's status. Will continue to monitor.

## 2018-11-21 NOTE — Discharge Summary (Signed)
Physician Discharge Summary  [name]:[id number] DOB: 06-04-1928 DOA: 11/18/2018  PCP: Susy Frizzle, MD  Admit date: 11/18/2018 Discharge date: 11/21/2018  Recommendations for Outpatient Follow-up:  1. Follow up with orthopedic surgery as directed in 2 weeks. 2. Follow up with PCP in 7-10 days after discharge from SNF. 3. PT/OT to follow in SNF.  Follow-up Information    Nicholes Stairs, MD In 2 weeks.   Specialty: Orthopedic Surgery Why: For suture removal Contact information: [address] [place] [address] [phone]            Discharge Diagnoses: Principal diagnosis is #1 1. Right hip fracture 2. Rhabdomyolysis 3. Acute encephalopathy 4. CAD s/p CABG in 1997 and 2010 5. Hx HFrEF/HFpEF 6. HTN 7. Hx CVA 8. Hx PVD  Discharge Condition: Fair Disposition: SNF  Diet recommendation: Heart healthy  Filed Weights   11/21/18 0357  Weight: 60 kg    History of present illness:  [name] is a 85 y.o. female with medical history significant of CAD s/p CABG, heart failure, CVA, HTN, HLD, depression, PVD s/p CEA and multiple other medical problems presenting after mechanical fall with R hip fracture.  History is limited as pt recently received morphine.  Hx is obtained from pt, medical record, and niece.  Pt notes she fell last night when walking to bathroom to get water.  She notes her cane slipped from under her.  She did hit her head, but no LOC.  Hit R side.  She notes that she was on the ground all night until family found her.  No LH or dizziness, no HA or CP.  She hit her R wrist as well.  Per discussion with her niece, Joycelyn Schmid, she was found down in the den and her story did not quite match where she was found in the morning.  Some ? About the story.  Per nursing, pt was a bit more confused when I spoke with her, but was A&Ox4 before receiving morphine.  Denies smoking and etoh.  Pt lives alone.  Her niece visits her 3x weekly  and family and friends bring food.  She gets around with a cane/walker, but often doesn't use assistive device in the home.   Hospital Course:  [facility] were consulted to admit the patient for acute fracture of her right hip. Orthopedic surgery was consulted. The patient underwent intramedullary nailing of her right femur on 11/19/2018. She has tolerated the procedure well. Orthopedic surgery has signed off. Pt is medically appropriate for discharge to SNF.  Today's assessment: Please see progress note dated 11/21/2018 for physical exam on the day of discharge.  Discharge Instructions  Discharge Instructions    Activity as tolerated - No restrictions   Complete by: As directed    Call MD for:  persistant nausea and vomiting   Complete by: As directed    Call MD for:  severe uncontrolled pain   Complete by: As directed    Diet - low sodium heart healthy   Complete by: As directed    Discharge instructions   Complete by: As directed    PT/OT at SNF Pt is to follow up with orthopedic surgery in two weeks as directed. Follow up with PCP in 7-10 days.   Discharge wound care:   Complete by: As directed    Maintain post op bandage until follow up appointment with orthopedic surgery  in 2 weeks   Increase activity slowly   Complete  by: As directed    Leave dressing on - Keep it clean, dry, and intact until clinic visit   Complete by: As directed     Weigh patient daily. Contact facility physician should she gain more than 3 lbs in one day or 5 lbs in one week. Allergies as of 11/21/2018      Reactions   Metoprolol Other (See Comments)   allopoecia   Phenergan [promethazine Hcl] Other (See Comments)   Facial numbness   Prochlorperazine Edisylate Anaphylaxis   Zetia [ezetimibe] Nausea Only, Other (See Comments)   Abdominal pain   Compazine [prochlorperazine]    Facial  Paralysis    Sulfur Other (See Comments)   paralyzes  face   Niacin Rash   Sulfonamide Derivatives Nausea  And Vomiting      Medication List    TAKE these medications   acetaminophen 325 MG tablet Commonly known as: TYLENOL Take 2 tablets (650 mg total) by mouth every 6 (six) hours as needed for mild pain, fever or headache (or Fever >/= 101).   aspirin EC 81 MG tablet Take 1 tablet (81 mg total) by mouth daily with breakfast.   docusate sodium 100 MG capsule Commonly known as: COLACE Take 1 capsule (100 mg total) by mouth 2 (two) times daily.   esomeprazole 20 MG capsule Commonly known as: NEXIUM Take 20 mg by mouth daily at 12 noon.   isosorbide mononitrate 60 MG 24 hr tablet Commonly known as: IMDUR Take 1 tablet (60 mg total) by mouth daily. Please make yearly appt with Dr. Elease HashimotoNahser before anymore refills. 1st attempt   nebivolol 10 MG tablet Commonly known as: Bystolic Take 1 tablet (10 mg total) by mouth daily. Hold for SBP < 110 mmhg and HR < 60 BPM   nitroGLYCERIN 0.4 MG SL tablet Commonly known as: NITROSTAT Place 1 tablet (0.4 mg total) under the tongue every 5 (five) minutes as needed for chest pain.   oxyCODONE 5 MG immediate release tablet Commonly known as: Oxy IR/ROXICODONE Take 1 tablet (5 mg total) by mouth every 4 (four) hours as needed for up to 5 days for severe pain.   potassium chloride 10 MEQ CR capsule Commonly known as: MICRO-K Take 1 capsule (10 mEq total) by mouth daily.   rosuvastatin 20 MG tablet Commonly known as: CRESTOR Take 1 tablet (20 mg total) by mouth daily.            Discharge Care Instructions  (From admission, onward)         Start     Ordered   11/21/18 0000  Discharge wound care:    Comments: Maintain post op bandage until follow up appointment with orthopedic surgery  in 2 weeks   11/21/18 1525         Allergies  Allergen Reactions   Metoprolol Other (See Comments)    allopoecia   Phenergan [Promethazine Hcl] Other (See Comments)    Facial numbness   Prochlorperazine Edisylate Anaphylaxis   Zetia  [Ezetimibe] Nausea Only and Other (See Comments)    Abdominal pain   Compazine [Prochlorperazine]     Facial  Paralysis        Sulfur Other (See Comments)    paralyzes  face    Niacin Rash   Sulfonamide Derivatives Nausea And Vomiting    The results of significant diagnostics from this hospitalization (including imaging, microbiology, ancillary and laboratory) are listed below for reference.    Significant Diagnostic Studies: Dg Ribs Unilateral W/chest  Right  Result Date: 11/18/2018 CLINICAL DATA:  Fall.  Pain. EXAM: RIGHT RIBS AND CHEST - 3+ VIEW COMPARISON:  09/10/2018. FINDINGS: Mediastinum hilar structures normal. Prior CABG. Heart size normal. Lungs are clear. No pleural effusion or pneumothorax. Prior midthoracic vertebroplasty. Old left posterior seventh rib fracture. No acute bony abnormality. IMPRESSION: 1.  Prior CABG.  No acute cardiopulmonary disease. 2.  No acute bony abnormality.  No pneumothorax. Electronically Signed   By: Maisie Fus  Register   On: 11/18/2018 14:03   Dg Elbow Complete Right  Result Date: 11/18/2018 CLINICAL DATA:  Larey Seat.  Right elbow pain. EXAM: RIGHT ELBOW - COMPLETE 3+ VIEW COMPARISON:  None. FINDINGS: Mild degenerative changes but no acute fracture or IMPRESSION: Degenerative changes but no acute bony findings or joint effusion. Electronically Signed   By: Rudie Meyer M.D.   On: 11/18/2018 14:46   Dg Forearm Right  Result Date: 11/18/2018 CLINICAL DATA:  Larey Seat.  Right forearm pain. EXAM: RIGHT FOREARM - 2 VIEW COMPARISON:  None. FINDINGS: The wrist and elbow joints are maintained. No acute forearm fractures are identified. IMPRESSION: No acute bony findings. Electronically Signed   By: Rudie Meyer M.D.   On: 11/18/2018 14:47   Ct Head Wo Contrast  Result Date: 11/18/2018 CLINICAL DATA:  Fall. Right eye swelling. EXAM: CT HEAD WITHOUT CONTRAST CT MAXILLOFACIAL WITHOUT CONTRAST CT CERVICAL SPINE WITHOUT CONTRAST TECHNIQUE: Multidetector CT imaging  of the head, cervical spine, and maxillofacial structures were performed using the standard protocol without intravenous contrast. Multiplanar CT image reconstructions of the cervical spine and maxillofacial structures were also generated. COMPARISON:  For 15 20 FINDINGS: CT HEAD FINDINGS Brain: There is no evidence of acute infarct, intracranial hemorrhage, mass, midline shift, or extra-axial fluid collection. Cerebral white matter hypodensities are nonspecific but compatible with mild for age chronic small vessel ischemic disease. Mild cerebral atrophy is unchanged. Vascular: Calcified atherosclerosis at the skull base. No hyperdense vessel. Skull: No fracture or suspicious osseous lesion. Other: None. CT MAXILLOFACIAL FINDINGS Osseous: No acute fracture or destructive osseous process. Decreased displacement/angulation of nasal bone fractures which were seen on the prior study. Orbits: Bilateral cataract extraction. Moderate right periorbital soft tissue swelling extending laterally and superiorly into the scalp. Sinuses: Trace right maxillary sinus fluid. Minimal bilateral ethmoid sinus mucosal thickening. Clear mastoid air cells. Soft tissues: No additional findings. CT CERVICAL SPINE FINDINGS The study is mildly to moderately motion degraded. Alignment: Trace anterolisthesis of C7 on T1. Skull base and vertebrae: No acute fracture identified within limitations of motion artifact. No destructive osseous lesion. Soft tissues and spinal canal: No evidence of significant prevertebral swelling or visible spinal canal hematoma. Disc levels: Mild disc and moderate facet degeneration in the cervical spine. No high-grade osseous neural foraminal or spinal canal stenosis. Upper chest: Mild biapical pleuroparenchymal lung scarring. Other: Unchanged 1.7 cm hypoattenuating left thyroid nodule. Calcified atherosclerosis at the carotid bifurcations, extensive on the right. Mild superficial swelling in the soft tissues of the  right lateral neck. IMPRESSION: 1. No evidence of acute intracranial abnormality. 2. Right periorbital, scalp, and neck soft tissue swelling. 3. No acute maxillofacial fracture. 4. No acute cervical spine fracture identified within limitations of motion artifact. Electronically Signed   By: Sebastian Ache M.D.   On: 11/18/2018 14:26   Ct Cervical Spine Wo Contrast  Result Date: 11/18/2018 CLINICAL DATA:  Fall. Right eye swelling. EXAM: CT HEAD WITHOUT CONTRAST CT MAXILLOFACIAL WITHOUT CONTRAST CT CERVICAL SPINE WITHOUT CONTRAST TECHNIQUE: Multidetector CT imaging of the head,  cervical spine, and maxillofacial structures were performed using the standard protocol without intravenous contrast. Multiplanar CT image reconstructions of the cervical spine and maxillofacial structures were also generated. COMPARISON:  For 15 20 FINDINGS: CT HEAD FINDINGS Brain: There is no evidence of acute infarct, intracranial hemorrhage, mass, midline shift, or extra-axial fluid collection. Cerebral white matter hypodensities are nonspecific but compatible with mild for age chronic small vessel ischemic disease. Mild cerebral atrophy is unchanged. Vascular: Calcified atherosclerosis at the skull base. No hyperdense vessel. Skull: No fracture or suspicious osseous lesion. Other: None. CT MAXILLOFACIAL FINDINGS Osseous: No acute fracture or destructive osseous process. Decreased displacement/angulation of nasal bone fractures which were seen on the prior study. Orbits: Bilateral cataract extraction. Moderate right periorbital soft tissue swelling extending laterally and superiorly into the scalp. Sinuses: Trace right maxillary sinus fluid. Minimal bilateral ethmoid sinus mucosal thickening. Clear mastoid air cells. Soft tissues: No additional findings. CT CERVICAL SPINE FINDINGS The study is mildly to moderately motion degraded. Alignment: Trace anterolisthesis of C7 on T1. Skull base and vertebrae: No acute fracture identified within  limitations of motion artifact. No destructive osseous lesion. Soft tissues and spinal canal: No evidence of significant prevertebral swelling or visible spinal canal hematoma. Disc levels: Mild disc and moderate facet degeneration in the cervical spine. No high-grade osseous neural foraminal or spinal canal stenosis. Upper chest: Mild biapical pleuroparenchymal lung scarring. Other: Unchanged 1.7 cm hypoattenuating left thyroid nodule. Calcified atherosclerosis at the carotid bifurcations, extensive on the right. Mild superficial swelling in the soft tissues of the right lateral neck. IMPRESSION: 1. No evidence of acute intracranial abnormality. 2. Right periorbital, scalp, and neck soft tissue swelling. 3. No acute maxillofacial fracture. 4. No acute cervical spine fracture identified within limitations of motion artifact. Electronically Signed   By: Sebastian AcheAllen  Grady M.D.   On: 11/18/2018 14:26   Dg Humerus Right  Result Date: 11/18/2018 CLINICAL DATA:  Larey SeatFell today.  Right arm pain. EXAM: RIGHT HUMERUS - 2+ VIEW COMPARISON:  None. FINDINGS: AC joint and glenohumeral joint degenerative changes are noted. The elbow joint is maintained. No acute fracture of the humerus is identified. Moderate soft tissue calcifications are noted around the right shoulder consistent with calcific tendinitis. IMPRESSION: No acute fracture. Electronically Signed   By: Rudie MeyerP.  Gallerani M.D.   On: 11/18/2018 14:48   Dg C-arm 1-60 Min  Result Date: 11/19/2018 CLINICAL DATA:  Right femur fracture fixation. EXAM: RIGHT FEMUR 2 VIEWS; DG C-ARM 61-120 MIN COMPARISON:  Radiographs 11/18/2018. FINDINGS: Five spot fluoroscopic images are submitted. These demonstrate the placement of a dynamic compression screw across the right femoral neck and proximal femoral diaphysis. There is a single distal interlocking screw. There is near anatomic reduction of the right femoral neck fracture. The hardware appears well positioned. No demonstrated  complications. IMPRESSION: Intraoperative views during ORIF of right femoral neck fracture. No demonstrated complication. Electronically Signed   By: Carey BullocksWilliam  Veazey M.D.   On: 11/19/2018 18:43   Dg Hip Unilat With Pelvis 2-3 Views Right  Result Date: 11/18/2018 CLINICAL DATA:  Larey SeatFell.  Right hip pain. EXAM: DG HIP (WITH OR WITHOUT PELVIS) 2-3V RIGHT COMPARISON:  None. FINDINGS: Displaced intertrochanteric fracture of the right hip is noted. Both hips are normally located. Moderate to advanced degenerative changes. The bony pelvis appears intact. IMPRESSION: Displaced intertrochanteric fracture of the right hip. Electronically Signed   By: Rudie MeyerP.  Gallerani M.D.   On: 11/18/2018 14:51   Dg Femur, Min 2 Views Right  Result Date: 11/19/2018  CLINICAL DATA:  Right femur fracture fixation. EXAM: RIGHT FEMUR 2 VIEWS; DG C-ARM 61-120 MIN COMPARISON:  Radiographs 11/18/2018. FINDINGS: Five spot fluoroscopic images are submitted. These demonstrate the placement of a dynamic compression screw across the right femoral neck and proximal femoral diaphysis. There is a single distal interlocking screw. There is near anatomic reduction of the right femoral neck fracture. The hardware appears well positioned. No demonstrated complications. IMPRESSION: Intraoperative views during ORIF of right femoral neck fracture. No demonstrated complication. Electronically Signed   By: Carey Bullocks M.D.   On: 11/19/2018 18:43   Dg Femur Min 2 Views Right  Result Date: 11/18/2018 CLINICAL DATA:  Larey Seat.  Right leg pain. EXAM: RIGHT FEMUR 2 VIEWS COMPARISON:  None. FINDINGS: Displaced intertrochanteric fracture of the right hip is noted. No femoral shaft fracture. Moderate degenerative changes and chondrocalcinosis noted at the knee. Extensive vascular calcifications. IMPRESSION: Right hip fracture but no femoral shaft fracture. Electronically Signed   By: Rudie Meyer M.D.   On: 11/18/2018 14:51   Ct Maxillofacial Wo Cm  Result Date:  11/18/2018 CLINICAL DATA:  Fall. Right eye swelling. EXAM: CT HEAD WITHOUT CONTRAST CT MAXILLOFACIAL WITHOUT CONTRAST CT CERVICAL SPINE WITHOUT CONTRAST TECHNIQUE: Multidetector CT imaging of the head, cervical spine, and maxillofacial structures were performed using the standard protocol without intravenous contrast. Multiplanar CT image reconstructions of the cervical spine and maxillofacial structures were also generated. COMPARISON:  For 15 20 FINDINGS: CT HEAD FINDINGS Brain: There is no evidence of acute infarct, intracranial hemorrhage, mass, midline shift, or extra-axial fluid collection. Cerebral white matter hypodensities are nonspecific but compatible with mild for age chronic small vessel ischemic disease. Mild cerebral atrophy is unchanged. Vascular: Calcified atherosclerosis at the skull base. No hyperdense vessel. Skull: No fracture or suspicious osseous lesion. Other: None. CT MAXILLOFACIAL FINDINGS Osseous: No acute fracture or destructive osseous process. Decreased displacement/angulation of nasal bone fractures which were seen on the prior study. Orbits: Bilateral cataract extraction. Moderate right periorbital soft tissue swelling extending laterally and superiorly into the scalp. Sinuses: Trace right maxillary sinus fluid. Minimal bilateral ethmoid sinus mucosal thickening. Clear mastoid air cells. Soft tissues: No additional findings. CT CERVICAL SPINE FINDINGS The study is mildly to moderately motion degraded. Alignment: Trace anterolisthesis of C7 on T1. Skull base and vertebrae: No acute fracture identified within limitations of motion artifact. No destructive osseous lesion. Soft tissues and spinal canal: No evidence of significant prevertebral swelling or visible spinal canal hematoma. Disc levels: Mild disc and moderate facet degeneration in the cervical spine. No high-grade osseous neural foraminal or spinal canal stenosis. Upper chest: Mild biapical pleuroparenchymal lung scarring.  Other: Unchanged 1.7 cm hypoattenuating left thyroid nodule. Calcified atherosclerosis at the carotid bifurcations, extensive on the right. Mild superficial swelling in the soft tissues of the right lateral neck. IMPRESSION: 1. No evidence of acute intracranial abnormality. 2. Right periorbital, scalp, and neck soft tissue swelling. 3. No acute maxillofacial fracture. 4. No acute cervical spine fracture identified within limitations of motion artifact. Electronically Signed   By: Sebastian Ache M.D.   On: 11/18/2018 14:26    Microbiology: Recent Results (from the past 240 hour(s))  SARS Coronavirus 2 (CEPHEID - Performed in [facility] hospital lab), Hosp Order     Status: None   Collection Time: 11/18/18  3:13 PM   Specimen: Nasopharyngeal Swab  Result Value Ref Range Status   SARS Coronavirus 2 NEGATIVE NEGATIVE Final    Comment: (NOTE) If result is NEGATIVE SARS-CoV-2 target nucleic  acids are NOT DETECTED. The SARS-CoV-2 RNA is generally detectable in upper and lower  respiratory specimens during the acute phase of infection. The lowest  concentration of SARS-CoV-2 viral copies this assay can detect is 250  copies / mL. A negative result does not preclude SARS-CoV-2 infection  and should not be used as the sole basis for treatment or other  patient management decisions.  A negative result may occur with  improper specimen collection / handling, submission of specimen other  than nasopharyngeal swab, presence of viral mutation(s) within the  areas targeted by this assay, and inadequate number of viral copies  (<250 copies / mL). A negative result must be combined with clinical  observations, patient history, and epidemiological information. If result is POSITIVE SARS-CoV-2 target nucleic acids are DETECTED. The SARS-CoV-2 RNA is generally detectable in upper and lower  respiratory specimens dur ing the acute phase of infection.  Positive  results are indicative of active infection with  SARS-CoV-2.  Clinical  correlation with patient history and other diagnostic information is  necessary to determine patient infection status.  Positive results do  not rule out bacterial infection or co-infection with other viruses. If result is PRESUMPTIVE POSTIVE SARS-CoV-2 nucleic acids MAY BE PRESENT.   A presumptive positive result was obtained on the submitted specimen  and confirmed on repeat testing.  While 2019 novel coronavirus  (SARS-CoV-2) nucleic acids may be present in the submitted sample  additional confirmatory testing may be necessary for epidemiological  and / or clinical management purposes  to differentiate between  SARS-CoV-2 and other Sarbecovirus currently known to infect humans.  If clinically indicated additional testing with an alternate test  methodology [phone]) is advised. The SARS-CoV-2 RNA is generally  detectable in upper and lower respiratory sp ecimens during the acute  phase of infection. The expected result is Negative. Fact Sheet for Patients:  BoilerBrush.com.cy Fact Sheet for Healthcare Providers: https://pope.com/ This test is not yet approved or cleared by the [place] FDA and has been authorized for detection and/or diagnosis of SARS-CoV-2 by FDA under an Emergency Use Authorization (EUA).  This EUA will remain in effect (meaning this test can be used) for the duration of the COVID-19 declaration under Section 564(b)(1) of the Act, 21 U.S.C. section 360bbb-3(b)(1), unless the authorization is terminated or revoked sooner. Performed at [facility], [address] [place], [place] [address]   Surgical PCR screen     Status: None   Collection Time: 11/19/18  9:35 AM   Specimen: Nasal Mucosa; Nasal Swab  Result Value Ref Range Status   MRSA, PCR NEGATIVE NEGATIVE Final   Staphylococcus aureus NEGATIVE NEGATIVE Final    Comment: (NOTE) The Xpert SA Assay (FDA approved  for NASAL specimens in patients 37 years of age and older), is one component of a comprehensive surveillance program. It is not intended to diagnose infection nor to guide or monitor treatment. Performed at [facility], [address] [place], [place] [address]      Labs: Basic Metabolic Panel: Recent Labs  Lab 11/18/18 1415 11/18/18 2126 11/19/18 0451 11/21/18 0203  NA 138  --  138 136  K 4.2  --  4.4 4.7  CL 103  --  104 103  CO2 23  --  23 25  GLUCOSE 144*  --  108* 154*  BUN 22  --  19 31*  CREATININE 0.88 1.10* 0.94 0.80  CALCIUM 9.1  --  9.2 8.8*  Liver Function Tests: Recent Labs  Lab 11/18/18 1415 11/19/18 0451  AST 48* 58*  ALT 23 26  ALKPHOS 60 51  BILITOT 0.9 0.9  PROT 6.7 5.8*  ALBUMIN 3.8 3.3*   No results for input(s): LIPASE, AMYLASE in the last 168 hours. No results for input(s): AMMONIA in the last 168 hours. CBC: Recent Labs  Lab 11/18/18 1415 11/18/18 2126 11/19/18 0451 11/21/18 0203  WBC 18.9* 17.7* 14.7* 17.5*  NEUTROABS 16.2*  --   --  13.9*  HGB 13.1 12.8 12.1 10.8*  HCT 39.4 37.6 36.5 32.4*  MCV 92.9 90.8 92.6 91.8  PLT 169 155 146* 166   Cardiac Enzymes: Recent Labs  Lab 11/18/18 1415  CKTOTAL 1,070*   BNP: BNP (last 3 results) No results for input(s): BNP in the last 8760 hours.  ProBNP (last 3 results) No results for input(s): PROBNP in the last 8760 hours.  CBG: Recent Labs  Lab 11/21/18 0957  GLUCAP 113*    Active Problems:   Hip fracture (HCC)   Time coordinating discharge: 38 minutes.  Signed:        Dian Laprade, DO [facility]  11/21/2018, 3:25 PM

## 2018-11-21 NOTE — Progress Notes (Signed)
PROGRESS NOTE  [name] DOB: 12-Aug-1928 DOA: 11/18/2018 PCP: Donita BrooksPickard, Warren T, MD  Brief History   [name] is a 85 y.o. female with medical history significant of CAD s/p CABG, heart failure, CVA, HTN, HLD, depression, PVD s/p CEA and multiple other medical problems presenting after mechanical fall with R hip fracture.  History is limited as pt recently received morphine.  Hx is obtained from pt, medical record, and niece.  Pt notes she fell last night when walking to bathroom to get water.  She notes her cane slipped from under her.  She did hit her head, but no LOC.  Hit R side.  She notes that she was on the ground all night until family found her.  No LH or dizziness, no HA or CP.  She hit her R wrist as well.  Per discussion with her niece, [name], she was found down in the den and her story did not quite match where she was found in the morning.  Some ? About the story.  Per nursing, pt was a bit more confused when I spoke with her, but was A&Ox4 before receiving morphine.  Denies smoking and etoh.  Pt lives alone.  Her niece visits her 3x weekly and family and friends bring food.  She gets around with a cane/walker, but often doesn't use assistive device in the home.   [facility] were consulted to admit the patient for acute fracture of her right hip. Orthopedic surgery was consulted. The patient underwent intramedullary nailing of her right femur on 11/19/2018. She has tolerated the procedure well.  Consultants  . Orthopedic surgery  Procedures  . None  Antibiotics   Anti-infectives (From admission, onward)   Start     Dose/Rate Route Frequency Ordered Stop   11/20/18 0600  ceFAZolin (ANCEF) IVPB 2g/100 mL premix     2 g 200 mL/hr over 30 Minutes Intravenous On call to O.R. 11/19/18 1535 11/19/18 1637   11/19/18 2200  ceFAZolin (ANCEF) IVPB 1 g/50 mL premix     1 g 100 mL/hr over 30 Minutes Intravenous Every 6 hours 11/19/18 1900 11/20/18 0905    11/19/18 1534  ceFAZolin (ANCEF) 2-4 GM/100ML-% IVPB    Note to Pharmacy: Sandi RavelingSchonewitz, Leigh   : cabinet override      11/19/18 1534 11/19/18 1607     .   Subjective  The patient is sitting up in a chair at bedside. No new complaints.   Objective   Vitals:  Vitals:   11/21/18 0547 11/21/18 0807  BP:  (!) 116/53  Pulse:  79  Resp:  18  Temp:  (!) 97.5 F (36.4 C)  SpO2: 94% 94%    Exam:  Constitutional:  . The patient is elderly, weak, and frail. She is awake, alert, and oriented x 3. No acute distress. Respiratory:  . No increased work of breathing. . No wheezes, rales, or rhonchi. . No tactile fremitus Cardiovascular:  . Regular rate and rhythm . No murmurs, ectopy, or gallups. . No lateral PMI. No thrills. Abdomen:  . Abdomen is soft, non-tender, non-distended. . No hernias, masses, or organomegaly is appreciated. . Normoactive bowel sounds. Musculoskeletal:  . No cyanosis, clubbing or edema. Peri [place]. Good capillary refill. Skin:  . No rashes, lesions, ulcers . palpation of skin: no induration or nodules Neurologic:  . CN 2-12 intact . Sensation all 4 extremities intact Psychiatric:  . Mental status o Mood, affect appropriate o Orientation to person, place, time  .  judgment and insight  - I am unable to judge these at this time due to somnolence and pain medications.   I have personally reviewed the following:   Today's Data  . Orthoptist  . PCR for MRSA negative   Scheduled Meds: . acetaminophen  1,000 mg Oral Q8H  . docusate sodium  100 mg Oral BID  . isosorbide mononitrate  60 mg Oral Daily  . mupirocin ointment  1 application Nasal BID  . nebivolol  10 mg Oral Daily  . pantoprazole  40 mg Oral Daily  . polyethylene glycol  17 g Oral Daily  . potassium chloride  10 mEq Oral Daily  . rosuvastatin  20 mg Oral Daily   Continuous Infusions: . lactated ringers 10 mL/hr at 11/21/18 0416    Active Problems:   Hip fracture ([place])   LOS: 3  days   A & P   Right Hip Fracture  Mechanical Fall: Pt reports cane slipped out from underneath her while walking.  No CP, LH, SOB, LOC.  X-ray demonstrated a right hip fracture. Head/maxilloface/neck CT without acute abnormality, rib films negative, plain films of R forearm, humerus, elbow negative. Discussed with pt and family (niece, [name]) at admission by admitting doctor, RCRI 3 (hx CAD, HF, stroke), risk of MACE ~15%.  Risk of surgery is not insignificant, but I think benefit of surgical fixation of hip fracture likely outweighs the risk.  She was living independently prior to this admission for her hip fracture.  No evidence of HF on exam or murmur.  Low risk stress test in 2016.  Echo in 2017 with grade 1 diastolic dysfunction and normal EF.  I do not believe additional cardiac testing would change management at this time. The patient is receiving APA and oxycodone as needed for pain. She has IV morphine for breakthrough pain. She is on a bowel regimen. DVT prophylaxis is SCD's. The patient underwent intramedullary naililng of right hip on 11/19/2018. She has tolerated the procedure well.  Rhabdomyolysis: Elevated CK. Will follow. It is unknown how long she may have been down.  Acute Encephalopathy: 2/2 morphine, will try lower dose.  Delirium precautions.  CAD s/p CABG in 1997 and 2010: pt asx.  Continue bystolic, crestor, imdur.  She reports to me she's not taking ASA anymore, but can't say why.  Hx HFrEF  HFpEF: EF improved on 2017 echo with EF of 55 - 60% at that time with grade one diastolic dysfunction. Appears euvolemic. Follow.  HTN: Blood pressures are well controlled. Continue bystolic and imdur as at home.  Hx CVA: Noted. Continue crestor, pt has ASA on med list, but says she hasn't taken it recently.  Hx PVD s/p R carotid endarterectomy: Noted. Continue ASA, Crestor. Monitor blood pressure.  Leukocytosis: likely reactive, afebrile. Follow.  I have seen and  examined this patient myself. I have spent 32 minutes in her evaluation and care.  COVID19 test: Negative DVT prophylaxis: SCD Code Status: DNR  Family Communication: None available Disposition Plan: Will require SNF.  Sarvesh Meddaugh, DO [facility] Direct contact: see [email or web address]  7PM-7AM contact night coverage as above  11/21/2018, 1:18 PM  LOS: 3 day

## 2018-12-01 ENCOUNTER — Other Ambulatory Visit: Payer: Self-pay | Admitting: Cardiovascular Disease

## 2019-02-09 ENCOUNTER — Other Ambulatory Visit: Payer: Self-pay | Admitting: Cardiovascular Disease

## 2019-02-23 ENCOUNTER — Other Ambulatory Visit: Payer: Self-pay | Admitting: Cardiovascular Disease

## 2019-03-04 ENCOUNTER — Telehealth: Payer: Medicare Other | Admitting: Cardiovascular Disease

## 2019-04-03 ENCOUNTER — Ambulatory Visit: Payer: Medicare Other | Admitting: Cardiovascular Disease

## 2019-04-09 ENCOUNTER — Emergency Department (HOSPITAL_COMMUNITY)
Admission: EM | Admit: 2019-04-09 | Discharge: 2019-04-09 | Disposition: A | Discharge status: Home / Self Care | Payer: Medicare Other | Attending: Emergency Medicine | Admitting: Emergency Medicine

## 2019-04-09 ENCOUNTER — Emergency Department (HOSPITAL_COMMUNITY): Payer: Medicare Other

## 2019-04-09 ENCOUNTER — Other Ambulatory Visit: Payer: Self-pay

## 2019-04-09 DIAGNOSIS — Y92129 Unspecified place in nursing home as the place of occurrence of the external cause: Secondary | ICD-10-CM | POA: Diagnosis not present

## 2019-04-09 DIAGNOSIS — Z8673 Personal history of transient ischemic attack (TIA), and cerebral infarction without residual deficits: Secondary | ICD-10-CM | POA: Diagnosis not present

## 2019-04-09 DIAGNOSIS — W19XXXA Unspecified fall, initial encounter: Secondary | ICD-10-CM | POA: Diagnosis not present

## 2019-04-09 DIAGNOSIS — Y939 Activity, unspecified: Secondary | ICD-10-CM | POA: Diagnosis not present

## 2019-04-09 DIAGNOSIS — S3992XA Unspecified injury of lower back, initial encounter: Secondary | ICD-10-CM | POA: Diagnosis present

## 2019-04-09 DIAGNOSIS — I110 Hypertensive heart disease with heart failure: Secondary | ICD-10-CM | POA: Diagnosis not present

## 2019-04-09 DIAGNOSIS — Y999 Unspecified external cause status: Secondary | ICD-10-CM | POA: Diagnosis not present

## 2019-04-09 DIAGNOSIS — S32019A Unspecified fracture of first lumbar vertebra, initial encounter for closed fracture: Secondary | ICD-10-CM | POA: Diagnosis not present

## 2019-04-09 DIAGNOSIS — I5032 Chronic diastolic (congestive) heart failure: Secondary | ICD-10-CM | POA: Diagnosis not present

## 2019-04-09 DIAGNOSIS — Z20828 Contact with and (suspected) exposure to other viral communicable diseases: Secondary | ICD-10-CM | POA: Diagnosis not present

## 2019-04-09 DIAGNOSIS — I2510 Atherosclerotic heart disease of native coronary artery without angina pectoris: Secondary | ICD-10-CM | POA: Insufficient documentation

## 2019-04-09 DIAGNOSIS — M25551 Pain in right hip: Secondary | ICD-10-CM | POA: Diagnosis not present

## 2019-04-09 DIAGNOSIS — Z951 Presence of aortocoronary bypass graft: Secondary | ICD-10-CM | POA: Diagnosis not present

## 2019-04-09 DIAGNOSIS — Z79899 Other long term (current) drug therapy: Secondary | ICD-10-CM | POA: Diagnosis not present

## 2019-04-09 LAB — CBC WITH DIFFERENTIAL/PLATELET
Abs Immature Granulocytes: 0.07 10*3/uL (ref 0.00–0.07)
Basophils Absolute: 0.1 10*3/uL (ref 0.0–0.1)
Basophils Relative: 1 %
Eosinophils Absolute: 0 10*3/uL (ref 0.0–0.5)
Eosinophils Relative: 0 %
HCT: 40.1 % (ref 36.0–46.0)
Hemoglobin: 12.9 g/dL (ref 12.0–15.0)
Immature Granulocytes: 1 %
Lymphocytes Relative: 16 %
Lymphs Abs: 1.7 10*3/uL (ref 0.7–4.0)
MCH: 30.6 pg (ref 26.0–34.0)
MCHC: 32.2 g/dL (ref 30.0–36.0)
MCV: 95 fL (ref 80.0–100.0)
Monocytes Absolute: 0.8 10*3/uL (ref 0.1–1.0)
Monocytes Relative: 7 %
Neutro Abs: 8.2 10*3/uL — ABNORMAL HIGH (ref 1.7–7.7)
Neutrophils Relative %: 75 %
Platelets: 265 10*3/uL (ref 150–400)
RBC: 4.22 MIL/uL (ref 3.87–5.11)
RDW: 12.3 % (ref 11.5–15.5)
WBC: 10.8 10*3/uL — ABNORMAL HIGH (ref 4.0–10.5)
nRBC: 0 % (ref 0.0–0.2)

## 2019-04-09 LAB — BASIC METABOLIC PANEL
Anion gap: 9 (ref 5–15)
BUN: 12 mg/dL (ref 8–23)
CO2: 26 mmol/L (ref 22–32)
Calcium: 9.5 mg/dL (ref 8.9–10.3)
Chloride: 101 mmol/L (ref 98–111)
Creatinine, Ser: 0.8 mg/dL (ref 0.44–1.00)
GFR calc Af Amer: >60 mL/min (ref >60)
GFR calc non Af Amer: >60 mL/min (ref >60)
Glucose, Bld: 105 mg/dL — ABNORMAL HIGH (ref 70–99)
Potassium: 4.1 mmol/L (ref 3.5–5.1)
Sodium: 136 mmol/L (ref 135–145)

## 2019-04-09 LAB — CK: Total CK: 321 U/L — ABNORMAL HIGH (ref 38–234)

## 2019-04-09 LAB — SARS CORONAVIRUS 2 (TAT 6-24 HRS): SARS Coronavirus 2: NEGATIVE

## 2019-04-09 MED ORDER — HYDROCODONE-ACETAMINOPHEN 5-325 MG PO TABS: 1.0000 | ORAL_TABLET | ORAL | 0 refills | Status: DC | PRN

## 2019-04-09 MED ORDER — FENTANYL CITRATE (PF) 100 MCG/2ML IJ SOLN
25.0000 ug | INTRAMUSCULAR | Status: DC | PRN
  Administered 2019-04-09 (×2): 25 ug via INTRAVENOUS
  Filled 2019-04-09 (×2): qty 2

## 2019-04-09 MED ORDER — MORPHINE SULFATE (PF) 2 MG/ML IV SOLN
2.0000 mg | INTRAVENOUS | Status: DC | PRN
  Filled 2019-04-09: qty 1

## 2019-04-09 MED ORDER — OXYCODONE-ACETAMINOPHEN 5-325 MG PO TABS
1.0000 | ORAL_TABLET | Freq: Once | ORAL | Status: AC
  Administered 2019-04-09: 11:00:00 1 via ORAL
  Filled 2019-04-09: qty 1

## 2019-04-09 MED ORDER — LORAZEPAM 2 MG/ML IJ SOLN
0.5000 mg | Freq: Once | INTRAMUSCULAR | Status: AC
  Administered 2019-04-09: 0.5 mg via INTRAVENOUS
  Filled 2019-04-09: qty 1

## 2019-04-09 MED ORDER — SODIUM CHLORIDE 0.9 % IV SOLN
INTRAVENOUS | Status: DC
  Administered 2019-04-09: 08:00:00 via INTRAVENOUS

## 2019-04-09 NOTE — ED Provider Notes (Signed)
[facility]-EMERGENCY DEPT Provider Note   CSN: [id number] Arrival date & time: 04/09/19  0725     History   Chief Complaint Chief Complaint  Patient presents with  . Fall    HPI [name] is a 85 y.o. female.     85 year old female presents after fall yesterday.  She is unsure of how this happened.  Last seen by staff at her facility was at 6 PM.  States that she has severe right-sided hip pain.  Also notes some pain in her ribs but denies any shortness of breath.  Denies any abdominal discomfort.  No weakness in arms or legs.  Is unsure if she passed out but denies any headache or neck discomfort.  No recent illnesses according to her.  Was found by staff today and EMS was called and patient transported here.  Does have a prior history of a right hip fracture     Past Medical History:  Diagnosis Date  . Allergy   . Asthma   . CAD (coronary artery disease)    s/p CABG x 5 in 1997 & CABG x 3 in 2010; Last cath in April of 2010 showed grafts to be patent. EF is 40 to 45%  . CHF (congestive heart failure) (HCC)    EF is 40 to 45%  . Depression    following bypass surgery  . Diverticulosis   . Endometriosis   . GERD (gastroesophageal reflux disease)   . Hepatitis A   . Hiatal hernia   . History of cerebrovascular disease   . History of vertebral fracture    at T8  . Hyperlipemia   . Hypertension   . LBP (low back pain)   . Mixed incontinence urge and stress 04/22/2013  . Nocturia 04/22/2013  . Pleural effusion    post surgical  . PVD (peripheral vascular disease) (HCC)    S/P right CEA  . Sleep apnea   . Stroke [facility]) 2010    Patient Active Problem List   Diagnosis Date Noted  . Hip fracture (HCC) 11/18/2018  . Closed displaced intertrochanteric fracture of right femur (HCC)   . Fall at home, initial encounter 09/11/2018  . Generalized weakness 09/11/2018  . UTI (urinary tract infection) 09/10/2018  . Tachycardia   . PAF (paroxysmal  atrial fibrillation) (HCC)   . Malnutrition of moderate degree 09/06/2017  . Multiple rib fractures 09/04/2017  . Dyspnea 01/21/2014  . Chronic diastolic CHF (congestive heart failure) (HCC) 12/15/2013  . Cerebral vascular disease 10/13/2013  . Mixed incontinence urge and stress 04/22/2013  . Nocturia 04/22/2013  . Angina decubitus (HCC) 03/12/2013  . GERD (gastroesophageal reflux disease) 08/16/2011  . Other general symptoms  08/16/2011  . Fatigue 09/04/2010  . HEPATITIS, ACUTE 07/12/2009  . AMI 07/12/2009  . ESOPHAGEAL REFLUX 07/12/2009  . DIVERTICULOSIS, COLON 07/12/2009  . CHEST PAIN 07/12/2009  . SLEEP APNEA 10/23/2007  . Essential hypertension 09/18/2007  . CAD (coronary artery disease) 09/18/2007  . CHEST PAIN, ATYPICAL 09/18/2007    Past Surgical History:  Procedure Laterality Date  . APPENDECTOMY    . CARDIAC CATHETERIZATION  April 2010   Grafts were patent. EF is 40 to 45%  . CAROTID ENDARTERECTOMY     right  . CORONARY ARTERY BYPASS GRAFT  [id number]   Original surgery in 1997 x 5; Redo CABG x 3 in 2010 including free RIMA to distal LAD, SVG to OM and SVG to left posterolateral  . INTRAMEDULLARY (IM) NAIL  INTERTROCHANTERIC Right 11/19/2018   Procedure: INTRAMEDULLARY (IM) NAIL INTERTROCHANTRIC;  Surgeon: Yolonda Kida, MD;  Location: [facility] OR;  Service: Orthopedics;  Laterality: Right;  . KNEE SURGERY     left  . LEFT HEART CATHETERIZATION WITH CORONARY/GRAFT ANGIOGRAM N/A 03/12/2013   Procedure: LEFT HEART CATHETERIZATION WITH Isabel Caprice;  Surgeon: Peter M Swaziland, MD;  Location: [facility] CATH LAB;  Service: Cardiovascular;  Laterality: N/A;  . PARTIAL COLECTOMY     due to colon abcess     OB History   No obstetric history on file.      Home Medications    Prior to Admission medications   Medication Sig Start Date End Date Taking? Authorizing Provider  acetaminophen (TYLENOL) 325 MG tablet Take 2 tablets (650 mg total) by mouth every 6 (six)  hours as needed for mild pain, fever or headache (or Fever >/= 101). 09/11/18   Shon Hale, MD  aspirin EC 81 MG tablet Take 1 tablet (81 mg total) by mouth daily with breakfast. Patient not taking: Reported on 11/18/2018 09/11/18   Shon Hale, MD  docusate sodium (COLACE) 100 MG capsule Take 1 capsule (100 mg total) by mouth 2 (two) times daily. 11/21/18   Swayze, Ava, DO  esomeprazole (NEXIUM) 20 MG capsule Take 20 mg by mouth daily at 12 noon.     [provider]  isosorbide mononitrate (IMDUR) 60 MG 24 hr tablet TAKE 1 TABLET BY MOUTH DAILY. PLEASE MAKE YEARLY APPT WITH DR. Elease Hashimoto BEFORE ANYMORE REFILLS 12/01/18   Nahser, Deloris Ping, MD  nebivolol (BYSTOLIC) 10 MG tablet Take 1 tablet (10 mg total) by mouth daily. Hold for SBP < 110 mmhg and HR < 60 BPM 09/12/18   Emokpae, Courage, MD  nitroGLYCERIN (NITROSTAT) 0.4 MG SL tablet Place 1 tablet (0.4 mg total) under the tongue every 5 (five) minutes as needed for chest pain. 10/26/17   Devoria Albe, MD  potassium chloride (MICRO-K) 10 MEQ CR capsule Take 1 capsule (10 mEq total) by mouth daily. 09/11/18   Shon Hale, MD  rosuvastatin (CRESTOR) 20 MG tablet Take 1 tablet (20 mg total) by mouth daily. Pt needs to keep appt in Oct for further refills 02/09/19   Nahser, Deloris Ping, MD    Family History Family History  Problem Relation Age of Onset  . Heart attack Mother   . Diabetes Mother   . Heart disease Mother   . Rheum arthritis Mother   . Heart attack Father   . Allergies Father   . Diabetes Sister   . Stroke Sister   . Heart disease Sister   . Dementia Brother   . Healthy Sister   . Other Brother        LUNG ISSUES  . Other Brother        LUNG ISSUES  . Other Brother        LUNG ISSUES  . Diabetes Maternal Grandmother   . Diabetes Paternal Grandmother   . Colon cancer Neg Hx     Social History Social History   Tobacco Use  . Smoking status: Never Smoker  . Smokeless tobacco: Never Used  Substance Use Topics   . Alcohol use: No    Alcohol/week: 0.0 standard drinks  . Drug use: No     Allergies   Metoprolol, Phenergan [promethazine hcl], Prochlorperazine edisylate, Zetia [ezetimibe], Compazine [prochlorperazine], Sulfur, Niacin, and Sulfonamide derivatives   Review of Systems Review of Systems  All other systems reviewed and are negative.  Physical Exam Updated Vital Signs SpO2 98%   Physical Exam Vitals signs and nursing note reviewed.  Constitutional:      General: She is not in acute distress.    Appearance: Normal appearance. She is well-developed. She is not toxic-appearing.  HENT:     Head: Normocephalic and atraumatic.  Eyes:     General: Lids are normal.     Conjunctiva/sclera: Conjunctivae normal.     Pupils: Pupils are equal, round, and reactive to light.  Neck:     Musculoskeletal: Normal range of motion and neck supple. No spinous process tenderness or muscular tenderness.     Thyroid: No thyroid mass.     Trachea: No tracheal deviation.  Cardiovascular:     Rate and Rhythm: Normal rate and regular rhythm.     Heart sounds: Normal heart sounds. No murmur. No gallop.   Pulmonary:     Effort: Pulmonary effort is normal. No respiratory distress.     Breath sounds: Normal breath sounds. No stridor. No decreased breath sounds, wheezing, rhonchi or rales.  Chest:    Abdominal:     General: Bowel sounds are normal. There is no distension.     Palpations: Abdomen is soft.     Tenderness: There is no abdominal tenderness. There is no rebound.  Musculoskeletal: Normal range of motion.        General: No tenderness.     Comments: Right lower extremity is shortened and internally rotated.  Neurovascular status is intact at right foot.  Skin:    General: Skin is warm and dry.     Findings: No abrasion or rash.  Neurological:     Mental Status: She is alert and oriented to person, place, and time.     GCS: GCS eye subscore is 4. GCS verbal subscore is 5. GCS motor  subscore is 6.     Cranial Nerves: No cranial nerve deficit.     Sensory: No sensory deficit.     Comments: Strength is 5 of 5 in upper as well as lower extremities.  Psychiatric:        Speech: Speech normal.        Behavior: Behavior normal.      ED Treatments / Results  Labs (all labs ordered are listed, but only abnormal results are displayed) Labs Reviewed  SARS CORONAVIRUS 2 (TAT 6-24 HRS)  CBC WITH DIFFERENTIAL/PLATELET  BASIC METABOLIC PANEL  URINALYSIS, ROUTINE W REFLEX MICROSCOPIC  CK    EKG None  Radiology No results found.  Procedures Procedures (including critical care time)  Medications Ordered in ED Medications  0.9 %  sodium chloride infusion (has no administration in time range)     Initial Impression / Assessment and Plan / ED Course  I have reviewed the triage vital signs and the nursing notes.  Pertinent labs & imaging results that were available during my care of the patient were reviewed by me and considered in my medical decision making (see chart for details).        Patient given fentanyl 25 mcg x 2 for her discomfort.  Had pain in her right hip which was evaluated with plain x-rays as well as with CT both of which were negative for fracture.  Patient complained of increasing pain to her lower lumbar spine.  She initially did not want to have x-rays but convinced her to have them.  Patient's imaging shows L1 endplate fracture which is age-indeterminate.  She also had negative CT of her  head neck.  Patient had been on the floor for over 12 hours but her CK was only slightly elevated at 321.  Patient very uncomfortable here and was treated with oral medications.  She was offered inpatient admission which she has refused.  She has capacity to make this decision.  Strongly encouraged to return if things get worse.  Final Clinical Impressions(s) / ED Diagnoses   Final diagnoses:  None    ED Discharge Orders    None       Lorre NickAllen, Jamielyn Petrucci,  MD 04/09/19 1223

## 2019-04-09 NOTE — ED Triage Notes (Signed)
Pt BIBA from [facility].   Per EMS- Pt had unwitnessed fall sometime this morning.  Staff found pt on floor appx 0645.  Pt at baseline, per staff.

## 2019-04-09 NOTE — ED Notes (Signed)
Patient transported to CT 

## 2019-04-09 NOTE — ED Notes (Signed)
Patient transported to X-ray 

## 2019-04-09 NOTE — ED Notes (Signed)
[facility] notified of need for transport. 

## 2019-04-09 NOTE — ED Notes (Signed)
Attempted to call report to [facility], no answer.  Left HIPAA compliant message.

## 2019-04-29 ENCOUNTER — Other Ambulatory Visit: Payer: Self-pay

## 2019-04-29 ENCOUNTER — Other Ambulatory Visit: Payer: Self-pay | Admitting: Orthopedic Surgery

## 2019-04-29 DIAGNOSIS — M545 Low back pain, unspecified: Secondary | ICD-10-CM

## 2019-06-01 DIAGNOSIS — Z20828 Contact with and (suspected) exposure to other viral communicable diseases: Secondary | ICD-10-CM | POA: Diagnosis not present

## 2019-06-02 ENCOUNTER — Ambulatory Visit: Payer: Medicare Other | Admitting: Cardiovascular Disease

## 2019-06-08 DIAGNOSIS — Z20828 Contact with and (suspected) exposure to other viral communicable diseases: Secondary | ICD-10-CM | POA: Diagnosis not present

## 2019-06-09 DIAGNOSIS — K219 Gastro-esophageal reflux disease without esophagitis: Secondary | ICD-10-CM | POA: Diagnosis not present

## 2019-06-09 DIAGNOSIS — R2689 Other abnormalities of gait and mobility: Secondary | ICD-10-CM | POA: Diagnosis not present

## 2019-06-09 DIAGNOSIS — I739 Peripheral vascular disease, unspecified: Secondary | ICD-10-CM | POA: Diagnosis not present

## 2019-06-09 DIAGNOSIS — R278 Other lack of coordination: Secondary | ICD-10-CM | POA: Diagnosis not present

## 2019-06-09 DIAGNOSIS — I69891 Dysphagia following other cerebrovascular disease: Secondary | ICD-10-CM | POA: Diagnosis not present

## 2019-06-09 DIAGNOSIS — I480 Paroxysmal atrial fibrillation: Secondary | ICD-10-CM | POA: Diagnosis not present

## 2019-06-09 DIAGNOSIS — R2681 Unsteadiness on feet: Secondary | ICD-10-CM | POA: Diagnosis not present

## 2019-06-09 DIAGNOSIS — S72141D Displaced intertrochanteric fracture of right femur, subsequent encounter for closed fracture with routine healing: Secondary | ICD-10-CM | POA: Diagnosis not present

## 2019-06-09 DIAGNOSIS — I2510 Atherosclerotic heart disease of native coronary artery without angina pectoris: Secondary | ICD-10-CM | POA: Diagnosis not present

## 2019-06-09 DIAGNOSIS — R41841 Cognitive communication deficit: Secondary | ICD-10-CM | POA: Diagnosis not present

## 2019-06-09 DIAGNOSIS — U071 COVID-19: Secondary | ICD-10-CM | POA: Diagnosis not present

## 2019-06-09 DIAGNOSIS — I5032 Chronic diastolic (congestive) heart failure: Secondary | ICD-10-CM | POA: Diagnosis not present

## 2019-06-11 DIAGNOSIS — I480 Paroxysmal atrial fibrillation: Secondary | ICD-10-CM | POA: Diagnosis not present

## 2019-06-11 DIAGNOSIS — U071 COVID-19: Secondary | ICD-10-CM | POA: Diagnosis not present

## 2019-06-11 DIAGNOSIS — I2510 Atherosclerotic heart disease of native coronary artery without angina pectoris: Secondary | ICD-10-CM | POA: Diagnosis not present

## 2019-06-11 DIAGNOSIS — I5032 Chronic diastolic (congestive) heart failure: Secondary | ICD-10-CM | POA: Diagnosis not present

## 2019-06-12 DIAGNOSIS — I5032 Chronic diastolic (congestive) heart failure: Secondary | ICD-10-CM | POA: Diagnosis not present

## 2019-06-12 DIAGNOSIS — U071 COVID-19: Secondary | ICD-10-CM | POA: Diagnosis not present

## 2019-06-12 DIAGNOSIS — I2510 Atherosclerotic heart disease of native coronary artery without angina pectoris: Secondary | ICD-10-CM | POA: Diagnosis not present

## 2019-06-12 DIAGNOSIS — I480 Paroxysmal atrial fibrillation: Secondary | ICD-10-CM | POA: Diagnosis not present

## 2019-06-26 DIAGNOSIS — U071 COVID-19: Secondary | ICD-10-CM | POA: Diagnosis not present

## 2019-06-26 DIAGNOSIS — I5032 Chronic diastolic (congestive) heart failure: Secondary | ICD-10-CM | POA: Diagnosis not present

## 2019-06-26 DIAGNOSIS — I480 Paroxysmal atrial fibrillation: Secondary | ICD-10-CM | POA: Diagnosis not present

## 2019-06-26 DIAGNOSIS — I739 Peripheral vascular disease, unspecified: Secondary | ICD-10-CM | POA: Diagnosis not present

## 2019-07-02 ENCOUNTER — Encounter: Payer: Self-pay | Admitting: Cardiology

## 2019-07-02 NOTE — Progress Notes (Deleted)
Cardiology Office Note:    Date:  07/02/2019   ID:  [name], DOB 03-28-1929, MRN [id number]  PCP:  Donita Brooks, MD  Cardiologist:  Kristeen Miss, MD  Referring MD: Donita Brooks, MD   No chief complaint on file. ***  History of Present Illness:    [name] is a 86 y.o. female with a past medical history significant for CAD s/p CABG in 1997 and again in 2010, CHF, CVA, hypertension, hyperlipidemia, depression, carotid artery disease s/p right CEA.  Her EF was noted to be 40-45% at the time of her CABG in 2010 but subsequently improved to 55-60%.  Last echocardiogram in 06/2015 showed EF 55-60% with no RWMA Normal Lexiscan Myoview in 10/2014  The patient was last seen in the office by Dr. Elease Hashimoto 11/18/2017.  She had had a fall and sustained some rib fractures.  It was noted that the patient has chronic shortness of breath.  She was not having any angina.  The patient was hospitalized in 10/2018 after a mechanical fall with right hip fracture.  She went for surgery and underwent intramedullary nailing.  She was subsequently discharged to SNF.  The patient is here today after being overdue for annual follow-up   Cardiac studies   Echocardiogram 07/20/2015 Study Conclusions   - Left ventricle: The cavity size was normal. Systolic function was  normal. The estimated ejection fraction was in the range of 55%  to 60%. Wall motion was normal; there were no regional wall  motion abnormalities. Doppler parameters are consistent with  abnormal left ventricular relaxation (grade 1 diastolic  dysfunction).  - Aortic valve: Trileaflet; mildly thickened, mildly calcified  leaflets.  - Mitral valve: Calcified annulus. Mildly thickened, mildly  calcified leaflets .  - Left atrium: The atrium was mildly dilated.  - Tricuspid valve: There was moderate regurgitation.  - Pulmonary arteries: Systolic pressure was moderately increased.   Eugenie Birks Myoview  11/22/2014 Study Highlights   The left ventricular ejection fraction is hyperdynamic (>65%).  Nuclear stress EF: 69%.  The study is normal.  This is a low risk study.   No chest pain with lexiscan stress. No EKG changes of ischemia. Normal perfusion. Normal LV systolic function without segmental wall motion abnormalities.     Past Medical History:  Diagnosis Date  . Allergy   . Asthma   . CAD (coronary artery disease)    s/p CABG x 5 in 1997 & CABG x 3 in 2010; Last cath in April of 2010 showed grafts to be patent. EF is 40 to 45%  . CHF (congestive heart failure) (HCC)    EF is 40 to 45%  . Depression    following bypass surgery  . Diverticulosis   . Endometriosis   . GERD (gastroesophageal reflux disease)   . Hepatitis A   . Hiatal hernia   . History of cerebrovascular disease   . History of vertebral fracture    at T8  . Hyperlipemia   . Hypertension   . LBP (low back pain)   . Mixed incontinence urge and stress 04/22/2013  . Nocturia 04/22/2013  . Pleural effusion    post surgical  . PVD (peripheral vascular disease) (HCC)    S/P right CEA  . Sleep apnea   . Stroke [facility]) 2010    Past Surgical History:  Procedure Laterality Date  . APPENDECTOMY    . CARDIAC CATHETERIZATION  April 2010   Grafts were patent. EF is 40 to  45%  . CAROTID ENDARTERECTOMY     right  . CORONARY ARTERY BYPASS GRAFT  [id number]   Original surgery in 1997 x 5; Redo CABG x 3 in 2010 including free RIMA to distal LAD, SVG to OM and SVG to left posterolateral  . INTRAMEDULLARY (IM) NAIL INTERTROCHANTERIC Right 11/19/2018   Procedure: INTRAMEDULLARY (IM) NAIL INTERTROCHANTRIC;  Surgeon: Yolonda Kida, MD;  Location: [facility] OR;  Service: Orthopedics;  Laterality: Right;  . KNEE SURGERY     left  . LEFT HEART CATHETERIZATION WITH CORONARY/GRAFT ANGIOGRAM N/A 03/12/2013   Procedure: LEFT HEART CATHETERIZATION WITH Isabel Caprice;  Surgeon: Peter M Swaziland, MD;  Location: [facility];  Service: Cardiovascular;  Laterality: N/A;  . PARTIAL COLECTOMY     due to colon abcess    Current Medications: No outpatient medications have been marked as taking for the 07/03/19 encounter (Appointment) with Berton Bon, NP.     Allergies:   Metoprolol, Phenergan [promethazine hcl], Prochlorperazine edisylate, Zetia [ezetimibe], Compazine [prochlorperazine], Sulfur, Niacin, and Sulfonamide derivatives   Social History   Socioeconomic History  . Marital status: Widowed    Spouse name: Not on file  . Number of children: 0  . Years of education: Not on file  . Highest education level: Not on file  Occupational History  . Occupation: retired Magazine features editor: RETIRED  Tobacco Use  . Smoking status: Never Smoker  . Smokeless tobacco: Never Used  Substance and Sexual Activity  . Alcohol use: No    Alcohol/week: 0.0 standard drinks  . Drug use: No  . Sexual activity: Never  Other Topics Concern  . Not on file  Social History Narrative  . Not on file   Social Determinants of Health   Financial Resource Strain:   . Difficulty of Paying Living Expenses: Not on file  Food Insecurity:   . Worried About Programme researcher, broadcasting/film/video in the Last Year: Not on file  . Ran Out of Food in the Last Year: Not on file  Transportation Needs:   . Lack of Transportation (Medical): Not on file  . Lack of Transportation (Non-Medical): Not on file  Physical Activity:   . Days of Exercise per Week: Not on file  . Minutes of Exercise per Session: Not on file  Stress:   . Feeling of Stress : Not on file  Social Connections:   . Frequency of Communication with Friends and Family: Not on file  . Frequency of Social Gatherings with Friends and Family: Not on file  . Attends Religious Services: Not on file  . Active Member of Clubs or Organizations: Not on file  . Attends Banker Meetings: Not on file  . Marital Status: Not on file     Family History: The patient's  ***family history includes Allergies in her father; Dementia in her brother; Diabetes in her maternal grandmother, mother, paternal grandmother, and sister; Healthy in her sister; Heart attack in her father and mother; Heart disease in her mother and sister; Other in her brother, brother, and brother; Rheum arthritis in her mother; Stroke in her sister. There is no history of Colon cancer. ROS:   Please see the history of present illness.    *** All other systems reviewed and are negative.   EKG:  EKG is *** ordered today.  The ekg ordered today demonstrates ***  Recent Labs: 09/11/2018: Magnesium 1.8; TSH 2.246 11/19/2018: ALT 26 04/09/2019: BUN 12; Creatinine, Ser 0.80; Hemoglobin 12.9; Platelets  265; Potassium 4.1; Sodium 136   Recent Lipid Panel    Component Value Date/Time   CHOL 135 10/19/2016 1448   TRIG 63 10/19/2016 1448   HDL 54 10/19/2016 1448   CHOLHDL 2.5 10/19/2016 1448   CHOLHDL 3 03/25/2014 1039   VLDL 19.8 03/25/2014 1039   LDLCALC 68 10/19/2016 1448    Physical Exam:    VS:  There were no vitals taken for this visit.    Wt Readings from Last 6 Encounters:  11/21/18 132 lb 4.4 oz (60 kg)  02/03/18 134 lb (60.8 kg)  11/18/17 131 lb (59.4 kg)  10/26/17 130 lb (59 kg)  09/05/17 119 lb (54 kg)  04/24/17 134 lb (60.8 kg)     Physical Exam***   ASSESSMENT:    1. Coronary artery disease involving coronary bypass graft of native heart without angina pectoris   2. Chronic diastolic CHF (congestive heart failure) ([place])   3. Hyperlipidemia, unspecified hyperlipidemia type   4. Carotid artery disease, unspecified laterality, unspecified type ([place])   5. Personal history of fall    PLAN:    In order of problems listed above:  CAD -s/p CABG in 1997 and again in 2010. -Medical therapy includes aspirin 81 mg, Imdur 60 mg, beta-blocker and statin.  Chronic diastolic CHF -EF was down mildly at the time of her repeat bypass surgery in 2010. Last echocardiogram in  06/2015 with normal LV systolic function, grade 1 diastolic dysfunction. -Patient has been known to have chronic dyspnea on exertion -Has not required diuretics  Hyperlipidemia -On rosuvastatin 20 mg daily -Lipids followed by PCP***  Carotid artery disease -Status post right carotid endarterectomy  History of falls -Recent fall with hip fracture requiring surgery in 10/2018  Medication Adjustments/Labs and Tests Ordered: Current medicines are reviewed at length with the patient today.  Concerns regarding medicines are outlined above. Labs and tests ordered and medication changes are outlined in the patient instructions below:  There are no Patient Instructions on file for this visit.   Signed, Daune Perch, NP  07/02/2019 1:46 PM    [facility]

## 2019-07-03 ENCOUNTER — Ambulatory Visit: Payer: Medicare PPO | Admitting: Cardiology

## 2019-07-03 DIAGNOSIS — I5032 Chronic diastolic (congestive) heart failure: Secondary | ICD-10-CM | POA: Diagnosis not present

## 2019-07-03 DIAGNOSIS — I480 Paroxysmal atrial fibrillation: Secondary | ICD-10-CM | POA: Diagnosis not present

## 2019-07-03 DIAGNOSIS — Z8673 Personal history of transient ischemic attack (TIA), and cerebral infarction without residual deficits: Secondary | ICD-10-CM | POA: Diagnosis not present

## 2019-07-03 DIAGNOSIS — U071 COVID-19: Secondary | ICD-10-CM | POA: Diagnosis not present

## 2019-07-05 DIAGNOSIS — I5032 Chronic diastolic (congestive) heart failure: Secondary | ICD-10-CM | POA: Diagnosis not present

## 2019-07-05 DIAGNOSIS — S32010D Wedge compression fracture of first lumbar vertebra, subsequent encounter for fracture with routine healing: Secondary | ICD-10-CM | POA: Diagnosis not present

## 2019-07-05 DIAGNOSIS — M545 Low back pain: Secondary | ICD-10-CM | POA: Diagnosis not present

## 2019-07-05 DIAGNOSIS — U071 COVID-19: Secondary | ICD-10-CM | POA: Diagnosis not present

## 2019-07-05 DIAGNOSIS — S72001D Fracture of unspecified part of neck of right femur, subsequent encounter for closed fracture with routine healing: Secondary | ICD-10-CM | POA: Diagnosis not present

## 2019-07-06 DIAGNOSIS — Z8673 Personal history of transient ischemic attack (TIA), and cerebral infarction without residual deficits: Secondary | ICD-10-CM | POA: Diagnosis not present

## 2019-07-06 DIAGNOSIS — I480 Paroxysmal atrial fibrillation: Secondary | ICD-10-CM | POA: Diagnosis not present

## 2019-07-06 DIAGNOSIS — W19XXXD Unspecified fall, subsequent encounter: Secondary | ICD-10-CM | POA: Diagnosis not present

## 2019-07-06 DIAGNOSIS — U071 COVID-19: Secondary | ICD-10-CM | POA: Diagnosis not present

## 2019-07-08 DIAGNOSIS — I509 Heart failure, unspecified: Secondary | ICD-10-CM | POA: Diagnosis not present

## 2019-07-08 DIAGNOSIS — I2510 Atherosclerotic heart disease of native coronary artery without angina pectoris: Secondary | ICD-10-CM | POA: Diagnosis not present

## 2019-07-08 DIAGNOSIS — S72009A Fracture of unspecified part of neck of unspecified femur, initial encounter for closed fracture: Secondary | ICD-10-CM | POA: Diagnosis not present

## 2019-07-08 DIAGNOSIS — M25559 Pain in unspecified hip: Secondary | ICD-10-CM | POA: Diagnosis not present

## 2019-07-11 DIAGNOSIS — I69891 Dysphagia following other cerebrovascular disease: Secondary | ICD-10-CM | POA: Diagnosis not present

## 2019-07-11 DIAGNOSIS — S72141D Displaced intertrochanteric fracture of right femur, subsequent encounter for closed fracture with routine healing: Secondary | ICD-10-CM | POA: Diagnosis not present

## 2019-07-11 DIAGNOSIS — I2510 Atherosclerotic heart disease of native coronary artery without angina pectoris: Secondary | ICD-10-CM | POA: Diagnosis not present

## 2019-07-11 DIAGNOSIS — I5032 Chronic diastolic (congestive) heart failure: Secondary | ICD-10-CM | POA: Diagnosis not present

## 2019-07-15 DIAGNOSIS — F331 Major depressive disorder, recurrent, moderate: Secondary | ICD-10-CM | POA: Diagnosis not present

## 2019-07-16 DIAGNOSIS — I2510 Atherosclerotic heart disease of native coronary artery without angina pectoris: Secondary | ICD-10-CM | POA: Diagnosis not present

## 2019-07-16 DIAGNOSIS — I480 Paroxysmal atrial fibrillation: Secondary | ICD-10-CM | POA: Diagnosis not present

## 2019-07-16 DIAGNOSIS — U071 COVID-19: Secondary | ICD-10-CM | POA: Diagnosis not present

## 2019-07-16 DIAGNOSIS — I5032 Chronic diastolic (congestive) heart failure: Secondary | ICD-10-CM | POA: Diagnosis not present

## 2019-07-27 DIAGNOSIS — F331 Major depressive disorder, recurrent, moderate: Secondary | ICD-10-CM | POA: Diagnosis not present

## 2019-07-28 DIAGNOSIS — E440 Moderate protein-calorie malnutrition: Secondary | ICD-10-CM | POA: Diagnosis not present

## 2019-07-28 DIAGNOSIS — R41841 Cognitive communication deficit: Secondary | ICD-10-CM | POA: Diagnosis not present

## 2019-07-28 DIAGNOSIS — I5032 Chronic diastolic (congestive) heart failure: Secondary | ICD-10-CM | POA: Diagnosis not present

## 2019-07-28 DIAGNOSIS — I739 Peripheral vascular disease, unspecified: Secondary | ICD-10-CM | POA: Diagnosis not present

## 2019-07-28 DIAGNOSIS — I480 Paroxysmal atrial fibrillation: Secondary | ICD-10-CM | POA: Diagnosis not present

## 2019-07-28 DIAGNOSIS — R2689 Other abnormalities of gait and mobility: Secondary | ICD-10-CM | POA: Diagnosis not present

## 2019-07-28 DIAGNOSIS — R2681 Unsteadiness on feet: Secondary | ICD-10-CM | POA: Diagnosis not present

## 2019-07-28 DIAGNOSIS — R278 Other lack of coordination: Secondary | ICD-10-CM | POA: Diagnosis not present

## 2019-07-28 DIAGNOSIS — K219 Gastro-esophageal reflux disease without esophagitis: Secondary | ICD-10-CM | POA: Diagnosis not present

## 2019-07-29 DIAGNOSIS — F331 Major depressive disorder, recurrent, moderate: Secondary | ICD-10-CM | POA: Diagnosis not present

## 2019-08-08 DIAGNOSIS — I69891 Dysphagia following other cerebrovascular disease: Secondary | ICD-10-CM | POA: Diagnosis not present

## 2019-08-08 DIAGNOSIS — I5032 Chronic diastolic (congestive) heart failure: Secondary | ICD-10-CM | POA: Diagnosis not present

## 2019-08-08 DIAGNOSIS — I2510 Atherosclerotic heart disease of native coronary artery without angina pectoris: Secondary | ICD-10-CM | POA: Diagnosis not present

## 2019-08-08 DIAGNOSIS — S72141D Displaced intertrochanteric fracture of right femur, subsequent encounter for closed fracture with routine healing: Secondary | ICD-10-CM | POA: Diagnosis not present

## 2019-08-11 DIAGNOSIS — F0391 Unspecified dementia with behavioral disturbance: Secondary | ICD-10-CM | POA: Diagnosis not present

## 2019-08-11 DIAGNOSIS — F331 Major depressive disorder, recurrent, moderate: Secondary | ICD-10-CM | POA: Diagnosis not present

## 2019-08-15 DIAGNOSIS — I480 Paroxysmal atrial fibrillation: Secondary | ICD-10-CM | POA: Diagnosis not present

## 2019-08-15 DIAGNOSIS — M25551 Pain in right hip: Secondary | ICD-10-CM | POA: Diagnosis not present

## 2019-08-15 DIAGNOSIS — S32010D Wedge compression fracture of first lumbar vertebra, subsequent encounter for fracture with routine healing: Secondary | ICD-10-CM | POA: Diagnosis not present

## 2019-08-15 DIAGNOSIS — M79604 Pain in right leg: Secondary | ICD-10-CM | POA: Diagnosis not present

## 2019-08-15 DIAGNOSIS — M25561 Pain in right knee: Secondary | ICD-10-CM | POA: Diagnosis not present

## 2019-08-15 DIAGNOSIS — I2510 Atherosclerotic heart disease of native coronary artery without angina pectoris: Secondary | ICD-10-CM | POA: Diagnosis not present

## 2019-08-15 DIAGNOSIS — M79661 Pain in right lower leg: Secondary | ICD-10-CM | POA: Diagnosis not present

## 2019-08-15 DIAGNOSIS — R634 Abnormal weight loss: Secondary | ICD-10-CM | POA: Diagnosis not present

## 2019-08-15 DIAGNOSIS — I5032 Chronic diastolic (congestive) heart failure: Secondary | ICD-10-CM | POA: Diagnosis not present

## 2019-08-15 DIAGNOSIS — W19XXXD Unspecified fall, subsequent encounter: Secondary | ICD-10-CM | POA: Diagnosis not present

## 2019-08-17 DIAGNOSIS — I739 Peripheral vascular disease, unspecified: Secondary | ICD-10-CM | POA: Diagnosis not present

## 2019-08-17 DIAGNOSIS — R319 Hematuria, unspecified: Secondary | ICD-10-CM | POA: Diagnosis not present

## 2019-08-17 DIAGNOSIS — B351 Tinea unguium: Secondary | ICD-10-CM | POA: Diagnosis not present

## 2019-08-17 DIAGNOSIS — Q845 Enlarged and hypertrophic nails: Secondary | ICD-10-CM | POA: Diagnosis not present

## 2019-08-17 DIAGNOSIS — N390 Urinary tract infection, site not specified: Secondary | ICD-10-CM | POA: Diagnosis not present

## 2019-08-20 DIAGNOSIS — I5032 Chronic diastolic (congestive) heart failure: Secondary | ICD-10-CM | POA: Diagnosis not present

## 2019-08-20 DIAGNOSIS — R41841 Cognitive communication deficit: Secondary | ICD-10-CM | POA: Diagnosis not present

## 2019-08-20 DIAGNOSIS — R2681 Unsteadiness on feet: Secondary | ICD-10-CM | POA: Diagnosis not present

## 2019-08-20 DIAGNOSIS — R2689 Other abnormalities of gait and mobility: Secondary | ICD-10-CM | POA: Diagnosis not present

## 2019-08-20 DIAGNOSIS — E440 Moderate protein-calorie malnutrition: Secondary | ICD-10-CM | POA: Diagnosis not present

## 2019-08-20 DIAGNOSIS — K219 Gastro-esophageal reflux disease without esophagitis: Secondary | ICD-10-CM | POA: Diagnosis not present

## 2019-08-20 DIAGNOSIS — I480 Paroxysmal atrial fibrillation: Secondary | ICD-10-CM | POA: Diagnosis not present

## 2019-08-20 DIAGNOSIS — I739 Peripheral vascular disease, unspecified: Secondary | ICD-10-CM | POA: Diagnosis not present

## 2019-08-20 DIAGNOSIS — R278 Other lack of coordination: Secondary | ICD-10-CM | POA: Diagnosis not present

## 2019-08-23 DIAGNOSIS — I2510 Atherosclerotic heart disease of native coronary artery without angina pectoris: Secondary | ICD-10-CM | POA: Diagnosis not present

## 2019-08-23 DIAGNOSIS — M79604 Pain in right leg: Secondary | ICD-10-CM | POA: Diagnosis not present

## 2019-08-23 DIAGNOSIS — W19XXXD Unspecified fall, subsequent encounter: Secondary | ICD-10-CM | POA: Diagnosis not present

## 2019-08-23 DIAGNOSIS — M545 Low back pain: Secondary | ICD-10-CM | POA: Diagnosis not present

## 2019-08-23 DIAGNOSIS — I480 Paroxysmal atrial fibrillation: Secondary | ICD-10-CM | POA: Diagnosis not present

## 2019-08-23 DIAGNOSIS — M25551 Pain in right hip: Secondary | ICD-10-CM | POA: Diagnosis not present

## 2019-08-23 DIAGNOSIS — M25561 Pain in right knee: Secondary | ICD-10-CM | POA: Diagnosis not present

## 2019-08-23 DIAGNOSIS — R634 Abnormal weight loss: Secondary | ICD-10-CM | POA: Diagnosis not present

## 2019-08-27 DIAGNOSIS — K219 Gastro-esophageal reflux disease without esophagitis: Secondary | ICD-10-CM | POA: Diagnosis not present

## 2019-08-27 DIAGNOSIS — R2681 Unsteadiness on feet: Secondary | ICD-10-CM | POA: Diagnosis not present

## 2019-08-27 DIAGNOSIS — E440 Moderate protein-calorie malnutrition: Secondary | ICD-10-CM | POA: Diagnosis not present

## 2019-08-27 DIAGNOSIS — R278 Other lack of coordination: Secondary | ICD-10-CM | POA: Diagnosis not present

## 2019-08-27 DIAGNOSIS — R41841 Cognitive communication deficit: Secondary | ICD-10-CM | POA: Diagnosis not present

## 2019-08-27 DIAGNOSIS — I480 Paroxysmal atrial fibrillation: Secondary | ICD-10-CM | POA: Diagnosis not present

## 2019-08-27 DIAGNOSIS — I739 Peripheral vascular disease, unspecified: Secondary | ICD-10-CM | POA: Diagnosis not present

## 2019-08-27 DIAGNOSIS — I5032 Chronic diastolic (congestive) heart failure: Secondary | ICD-10-CM | POA: Diagnosis not present

## 2019-08-27 DIAGNOSIS — R2689 Other abnormalities of gait and mobility: Secondary | ICD-10-CM | POA: Diagnosis not present

## 2019-08-28 DIAGNOSIS — R41841 Cognitive communication deficit: Secondary | ICD-10-CM | POA: Diagnosis not present

## 2019-08-28 DIAGNOSIS — R2681 Unsteadiness on feet: Secondary | ICD-10-CM | POA: Diagnosis not present

## 2019-08-28 DIAGNOSIS — I5032 Chronic diastolic (congestive) heart failure: Secondary | ICD-10-CM | POA: Diagnosis not present

## 2019-08-28 DIAGNOSIS — R2689 Other abnormalities of gait and mobility: Secondary | ICD-10-CM | POA: Diagnosis not present

## 2019-08-28 DIAGNOSIS — E440 Moderate protein-calorie malnutrition: Secondary | ICD-10-CM | POA: Diagnosis not present

## 2019-08-28 DIAGNOSIS — I739 Peripheral vascular disease, unspecified: Secondary | ICD-10-CM | POA: Diagnosis not present

## 2019-08-28 DIAGNOSIS — R278 Other lack of coordination: Secondary | ICD-10-CM | POA: Diagnosis not present

## 2019-08-28 DIAGNOSIS — K219 Gastro-esophageal reflux disease without esophagitis: Secondary | ICD-10-CM | POA: Diagnosis not present

## 2019-08-28 DIAGNOSIS — I480 Paroxysmal atrial fibrillation: Secondary | ICD-10-CM | POA: Diagnosis not present

## 2019-08-31 DIAGNOSIS — R278 Other lack of coordination: Secondary | ICD-10-CM | POA: Diagnosis not present

## 2019-08-31 DIAGNOSIS — R41841 Cognitive communication deficit: Secondary | ICD-10-CM | POA: Diagnosis not present

## 2019-08-31 DIAGNOSIS — I739 Peripheral vascular disease, unspecified: Secondary | ICD-10-CM | POA: Diagnosis not present

## 2019-08-31 DIAGNOSIS — K219 Gastro-esophageal reflux disease without esophagitis: Secondary | ICD-10-CM | POA: Diagnosis not present

## 2019-08-31 DIAGNOSIS — I480 Paroxysmal atrial fibrillation: Secondary | ICD-10-CM | POA: Diagnosis not present

## 2019-08-31 DIAGNOSIS — E440 Moderate protein-calorie malnutrition: Secondary | ICD-10-CM | POA: Diagnosis not present

## 2019-08-31 DIAGNOSIS — I5032 Chronic diastolic (congestive) heart failure: Secondary | ICD-10-CM | POA: Diagnosis not present

## 2019-08-31 DIAGNOSIS — R2681 Unsteadiness on feet: Secondary | ICD-10-CM | POA: Diagnosis not present

## 2019-08-31 DIAGNOSIS — R2689 Other abnormalities of gait and mobility: Secondary | ICD-10-CM | POA: Diagnosis not present

## 2019-09-01 DIAGNOSIS — I480 Paroxysmal atrial fibrillation: Secondary | ICD-10-CM | POA: Diagnosis not present

## 2019-09-01 DIAGNOSIS — I739 Peripheral vascular disease, unspecified: Secondary | ICD-10-CM | POA: Diagnosis not present

## 2019-09-01 DIAGNOSIS — F331 Major depressive disorder, recurrent, moderate: Secondary | ICD-10-CM | POA: Diagnosis not present

## 2019-09-01 DIAGNOSIS — E440 Moderate protein-calorie malnutrition: Secondary | ICD-10-CM | POA: Diagnosis not present

## 2019-09-01 DIAGNOSIS — R2689 Other abnormalities of gait and mobility: Secondary | ICD-10-CM | POA: Diagnosis not present

## 2019-09-01 DIAGNOSIS — K219 Gastro-esophageal reflux disease without esophagitis: Secondary | ICD-10-CM | POA: Diagnosis not present

## 2019-09-01 DIAGNOSIS — R278 Other lack of coordination: Secondary | ICD-10-CM | POA: Diagnosis not present

## 2019-09-01 DIAGNOSIS — R41841 Cognitive communication deficit: Secondary | ICD-10-CM | POA: Diagnosis not present

## 2019-09-01 DIAGNOSIS — F0391 Unspecified dementia with behavioral disturbance: Secondary | ICD-10-CM | POA: Diagnosis not present

## 2019-09-01 DIAGNOSIS — I5032 Chronic diastolic (congestive) heart failure: Secondary | ICD-10-CM | POA: Diagnosis not present

## 2019-09-01 DIAGNOSIS — R2681 Unsteadiness on feet: Secondary | ICD-10-CM | POA: Diagnosis not present

## 2019-09-01 DIAGNOSIS — G894 Chronic pain syndrome: Secondary | ICD-10-CM | POA: Diagnosis not present

## 2019-09-02 DIAGNOSIS — I509 Heart failure, unspecified: Secondary | ICD-10-CM | POA: Diagnosis not present

## 2019-09-02 DIAGNOSIS — I5032 Chronic diastolic (congestive) heart failure: Secondary | ICD-10-CM | POA: Diagnosis not present

## 2019-09-02 DIAGNOSIS — R2681 Unsteadiness on feet: Secondary | ICD-10-CM | POA: Diagnosis not present

## 2019-09-02 DIAGNOSIS — M25559 Pain in unspecified hip: Secondary | ICD-10-CM | POA: Diagnosis not present

## 2019-09-02 DIAGNOSIS — R41841 Cognitive communication deficit: Secondary | ICD-10-CM | POA: Diagnosis not present

## 2019-09-02 DIAGNOSIS — R278 Other lack of coordination: Secondary | ICD-10-CM | POA: Diagnosis not present

## 2019-09-02 DIAGNOSIS — S72009A Fracture of unspecified part of neck of unspecified femur, initial encounter for closed fracture: Secondary | ICD-10-CM | POA: Diagnosis not present

## 2019-09-02 DIAGNOSIS — K219 Gastro-esophageal reflux disease without esophagitis: Secondary | ICD-10-CM | POA: Diagnosis not present

## 2019-09-02 DIAGNOSIS — E440 Moderate protein-calorie malnutrition: Secondary | ICD-10-CM | POA: Diagnosis not present

## 2019-09-02 DIAGNOSIS — I480 Paroxysmal atrial fibrillation: Secondary | ICD-10-CM | POA: Diagnosis not present

## 2019-09-02 DIAGNOSIS — R2689 Other abnormalities of gait and mobility: Secondary | ICD-10-CM | POA: Diagnosis not present

## 2019-09-02 DIAGNOSIS — I2510 Atherosclerotic heart disease of native coronary artery without angina pectoris: Secondary | ICD-10-CM | POA: Diagnosis not present

## 2019-09-02 DIAGNOSIS — I739 Peripheral vascular disease, unspecified: Secondary | ICD-10-CM | POA: Diagnosis not present

## 2019-09-03 DIAGNOSIS — E440 Moderate protein-calorie malnutrition: Secondary | ICD-10-CM | POA: Diagnosis not present

## 2019-09-03 DIAGNOSIS — K219 Gastro-esophageal reflux disease without esophagitis: Secondary | ICD-10-CM | POA: Diagnosis not present

## 2019-09-03 DIAGNOSIS — R2689 Other abnormalities of gait and mobility: Secondary | ICD-10-CM | POA: Diagnosis not present

## 2019-09-03 DIAGNOSIS — R278 Other lack of coordination: Secondary | ICD-10-CM | POA: Diagnosis not present

## 2019-09-03 DIAGNOSIS — I5032 Chronic diastolic (congestive) heart failure: Secondary | ICD-10-CM | POA: Diagnosis not present

## 2019-09-03 DIAGNOSIS — R2681 Unsteadiness on feet: Secondary | ICD-10-CM | POA: Diagnosis not present

## 2019-09-03 DIAGNOSIS — R41841 Cognitive communication deficit: Secondary | ICD-10-CM | POA: Diagnosis not present

## 2019-09-03 DIAGNOSIS — I480 Paroxysmal atrial fibrillation: Secondary | ICD-10-CM | POA: Diagnosis not present

## 2019-09-03 DIAGNOSIS — I739 Peripheral vascular disease, unspecified: Secondary | ICD-10-CM | POA: Diagnosis not present

## 2019-09-04 DIAGNOSIS — R2681 Unsteadiness on feet: Secondary | ICD-10-CM | POA: Diagnosis not present

## 2019-09-04 DIAGNOSIS — K219 Gastro-esophageal reflux disease without esophagitis: Secondary | ICD-10-CM | POA: Diagnosis not present

## 2019-09-04 DIAGNOSIS — R278 Other lack of coordination: Secondary | ICD-10-CM | POA: Diagnosis not present

## 2019-09-04 DIAGNOSIS — R2689 Other abnormalities of gait and mobility: Secondary | ICD-10-CM | POA: Diagnosis not present

## 2019-09-04 DIAGNOSIS — I480 Paroxysmal atrial fibrillation: Secondary | ICD-10-CM | POA: Diagnosis not present

## 2019-09-04 DIAGNOSIS — I739 Peripheral vascular disease, unspecified: Secondary | ICD-10-CM | POA: Diagnosis not present

## 2019-09-04 DIAGNOSIS — I5032 Chronic diastolic (congestive) heart failure: Secondary | ICD-10-CM | POA: Diagnosis not present

## 2019-09-04 DIAGNOSIS — E440 Moderate protein-calorie malnutrition: Secondary | ICD-10-CM | POA: Diagnosis not present

## 2019-09-04 DIAGNOSIS — R41841 Cognitive communication deficit: Secondary | ICD-10-CM | POA: Diagnosis not present

## 2019-09-07 DIAGNOSIS — I480 Paroxysmal atrial fibrillation: Secondary | ICD-10-CM | POA: Diagnosis not present

## 2019-09-07 DIAGNOSIS — R2689 Other abnormalities of gait and mobility: Secondary | ICD-10-CM | POA: Diagnosis not present

## 2019-09-07 DIAGNOSIS — R278 Other lack of coordination: Secondary | ICD-10-CM | POA: Diagnosis not present

## 2019-09-07 DIAGNOSIS — R41841 Cognitive communication deficit: Secondary | ICD-10-CM | POA: Diagnosis not present

## 2019-09-07 DIAGNOSIS — I5032 Chronic diastolic (congestive) heart failure: Secondary | ICD-10-CM | POA: Diagnosis not present

## 2019-09-07 DIAGNOSIS — R2681 Unsteadiness on feet: Secondary | ICD-10-CM | POA: Diagnosis not present

## 2019-09-07 DIAGNOSIS — I739 Peripheral vascular disease, unspecified: Secondary | ICD-10-CM | POA: Diagnosis not present

## 2019-09-07 DIAGNOSIS — K219 Gastro-esophageal reflux disease without esophagitis: Secondary | ICD-10-CM | POA: Diagnosis not present

## 2019-09-07 DIAGNOSIS — E440 Moderate protein-calorie malnutrition: Secondary | ICD-10-CM | POA: Diagnosis not present

## 2019-09-08 DIAGNOSIS — I739 Peripheral vascular disease, unspecified: Secondary | ICD-10-CM | POA: Diagnosis not present

## 2019-09-08 DIAGNOSIS — I5032 Chronic diastolic (congestive) heart failure: Secondary | ICD-10-CM | POA: Diagnosis not present

## 2019-09-08 DIAGNOSIS — I2510 Atherosclerotic heart disease of native coronary artery without angina pectoris: Secondary | ICD-10-CM | POA: Diagnosis not present

## 2019-09-08 DIAGNOSIS — R2689 Other abnormalities of gait and mobility: Secondary | ICD-10-CM | POA: Diagnosis not present

## 2019-09-08 DIAGNOSIS — K219 Gastro-esophageal reflux disease without esophagitis: Secondary | ICD-10-CM | POA: Diagnosis not present

## 2019-09-08 DIAGNOSIS — S72141D Displaced intertrochanteric fracture of right femur, subsequent encounter for closed fracture with routine healing: Secondary | ICD-10-CM | POA: Diagnosis not present

## 2019-09-08 DIAGNOSIS — R278 Other lack of coordination: Secondary | ICD-10-CM | POA: Diagnosis not present

## 2019-09-08 DIAGNOSIS — I480 Paroxysmal atrial fibrillation: Secondary | ICD-10-CM | POA: Diagnosis not present

## 2019-09-08 DIAGNOSIS — E440 Moderate protein-calorie malnutrition: Secondary | ICD-10-CM | POA: Diagnosis not present

## 2019-09-08 DIAGNOSIS — R2681 Unsteadiness on feet: Secondary | ICD-10-CM | POA: Diagnosis not present

## 2019-09-08 DIAGNOSIS — I69891 Dysphagia following other cerebrovascular disease: Secondary | ICD-10-CM | POA: Diagnosis not present

## 2019-09-08 DIAGNOSIS — R41841 Cognitive communication deficit: Secondary | ICD-10-CM | POA: Diagnosis not present

## 2019-09-09 DIAGNOSIS — R2681 Unsteadiness on feet: Secondary | ICD-10-CM | POA: Diagnosis not present

## 2019-09-09 DIAGNOSIS — R41841 Cognitive communication deficit: Secondary | ICD-10-CM | POA: Diagnosis not present

## 2019-09-09 DIAGNOSIS — I5032 Chronic diastolic (congestive) heart failure: Secondary | ICD-10-CM | POA: Diagnosis not present

## 2019-09-09 DIAGNOSIS — K219 Gastro-esophageal reflux disease without esophagitis: Secondary | ICD-10-CM | POA: Diagnosis not present

## 2019-09-09 DIAGNOSIS — E440 Moderate protein-calorie malnutrition: Secondary | ICD-10-CM | POA: Diagnosis not present

## 2019-09-09 DIAGNOSIS — R278 Other lack of coordination: Secondary | ICD-10-CM | POA: Diagnosis not present

## 2019-09-09 DIAGNOSIS — I739 Peripheral vascular disease, unspecified: Secondary | ICD-10-CM | POA: Diagnosis not present

## 2019-09-09 DIAGNOSIS — R2689 Other abnormalities of gait and mobility: Secondary | ICD-10-CM | POA: Diagnosis not present

## 2019-09-09 DIAGNOSIS — I480 Paroxysmal atrial fibrillation: Secondary | ICD-10-CM | POA: Diagnosis not present

## 2019-09-10 DIAGNOSIS — I739 Peripheral vascular disease, unspecified: Secondary | ICD-10-CM | POA: Diagnosis not present

## 2019-09-10 DIAGNOSIS — R2681 Unsteadiness on feet: Secondary | ICD-10-CM | POA: Diagnosis not present

## 2019-09-10 DIAGNOSIS — R278 Other lack of coordination: Secondary | ICD-10-CM | POA: Diagnosis not present

## 2019-09-10 DIAGNOSIS — R41841 Cognitive communication deficit: Secondary | ICD-10-CM | POA: Diagnosis not present

## 2019-09-10 DIAGNOSIS — I5032 Chronic diastolic (congestive) heart failure: Secondary | ICD-10-CM | POA: Diagnosis not present

## 2019-09-10 DIAGNOSIS — R2689 Other abnormalities of gait and mobility: Secondary | ICD-10-CM | POA: Diagnosis not present

## 2019-09-10 DIAGNOSIS — I480 Paroxysmal atrial fibrillation: Secondary | ICD-10-CM | POA: Diagnosis not present

## 2019-09-10 DIAGNOSIS — K219 Gastro-esophageal reflux disease without esophagitis: Secondary | ICD-10-CM | POA: Diagnosis not present

## 2019-09-10 DIAGNOSIS — E440 Moderate protein-calorie malnutrition: Secondary | ICD-10-CM | POA: Diagnosis not present

## 2019-09-11 DIAGNOSIS — I5032 Chronic diastolic (congestive) heart failure: Secondary | ICD-10-CM | POA: Diagnosis not present

## 2019-09-11 DIAGNOSIS — Z20828 Contact with and (suspected) exposure to other viral communicable diseases: Secondary | ICD-10-CM | POA: Diagnosis not present

## 2019-09-11 DIAGNOSIS — R2681 Unsteadiness on feet: Secondary | ICD-10-CM | POA: Diagnosis not present

## 2019-09-11 DIAGNOSIS — I480 Paroxysmal atrial fibrillation: Secondary | ICD-10-CM | POA: Diagnosis not present

## 2019-09-11 DIAGNOSIS — R41841 Cognitive communication deficit: Secondary | ICD-10-CM | POA: Diagnosis not present

## 2019-09-11 DIAGNOSIS — I739 Peripheral vascular disease, unspecified: Secondary | ICD-10-CM | POA: Diagnosis not present

## 2019-09-11 DIAGNOSIS — E440 Moderate protein-calorie malnutrition: Secondary | ICD-10-CM | POA: Diagnosis not present

## 2019-09-11 DIAGNOSIS — K219 Gastro-esophageal reflux disease without esophagitis: Secondary | ICD-10-CM | POA: Diagnosis not present

## 2019-09-11 DIAGNOSIS — R2689 Other abnormalities of gait and mobility: Secondary | ICD-10-CM | POA: Diagnosis not present

## 2019-09-11 DIAGNOSIS — R278 Other lack of coordination: Secondary | ICD-10-CM | POA: Diagnosis not present

## 2019-09-12 DIAGNOSIS — R2689 Other abnormalities of gait and mobility: Secondary | ICD-10-CM | POA: Diagnosis not present

## 2019-09-12 DIAGNOSIS — I5032 Chronic diastolic (congestive) heart failure: Secondary | ICD-10-CM | POA: Diagnosis not present

## 2019-09-12 DIAGNOSIS — R2681 Unsteadiness on feet: Secondary | ICD-10-CM | POA: Diagnosis not present

## 2019-09-12 DIAGNOSIS — I739 Peripheral vascular disease, unspecified: Secondary | ICD-10-CM | POA: Diagnosis not present

## 2019-09-12 DIAGNOSIS — E440 Moderate protein-calorie malnutrition: Secondary | ICD-10-CM | POA: Diagnosis not present

## 2019-09-12 DIAGNOSIS — K219 Gastro-esophageal reflux disease without esophagitis: Secondary | ICD-10-CM | POA: Diagnosis not present

## 2019-09-12 DIAGNOSIS — I480 Paroxysmal atrial fibrillation: Secondary | ICD-10-CM | POA: Diagnosis not present

## 2019-09-12 DIAGNOSIS — R41841 Cognitive communication deficit: Secondary | ICD-10-CM | POA: Diagnosis not present

## 2019-09-12 DIAGNOSIS — R278 Other lack of coordination: Secondary | ICD-10-CM | POA: Diagnosis not present

## 2019-09-14 DIAGNOSIS — I480 Paroxysmal atrial fibrillation: Secondary | ICD-10-CM | POA: Diagnosis not present

## 2019-09-14 DIAGNOSIS — K219 Gastro-esophageal reflux disease without esophagitis: Secondary | ICD-10-CM | POA: Diagnosis not present

## 2019-09-14 DIAGNOSIS — R278 Other lack of coordination: Secondary | ICD-10-CM | POA: Diagnosis not present

## 2019-09-14 DIAGNOSIS — E440 Moderate protein-calorie malnutrition: Secondary | ICD-10-CM | POA: Diagnosis not present

## 2019-09-14 DIAGNOSIS — I739 Peripheral vascular disease, unspecified: Secondary | ICD-10-CM | POA: Diagnosis not present

## 2019-09-14 DIAGNOSIS — I5032 Chronic diastolic (congestive) heart failure: Secondary | ICD-10-CM | POA: Diagnosis not present

## 2019-09-14 DIAGNOSIS — R2681 Unsteadiness on feet: Secondary | ICD-10-CM | POA: Diagnosis not present

## 2019-09-14 DIAGNOSIS — R41841 Cognitive communication deficit: Secondary | ICD-10-CM | POA: Diagnosis not present

## 2019-09-14 DIAGNOSIS — R2689 Other abnormalities of gait and mobility: Secondary | ICD-10-CM | POA: Diagnosis not present

## 2019-09-15 DIAGNOSIS — G894 Chronic pain syndrome: Secondary | ICD-10-CM | POA: Diagnosis not present

## 2019-09-15 DIAGNOSIS — I480 Paroxysmal atrial fibrillation: Secondary | ICD-10-CM | POA: Diagnosis not present

## 2019-09-15 DIAGNOSIS — R41841 Cognitive communication deficit: Secondary | ICD-10-CM | POA: Diagnosis not present

## 2019-09-15 DIAGNOSIS — I739 Peripheral vascular disease, unspecified: Secondary | ICD-10-CM | POA: Diagnosis not present

## 2019-09-15 DIAGNOSIS — I5032 Chronic diastolic (congestive) heart failure: Secondary | ICD-10-CM | POA: Diagnosis not present

## 2019-09-15 DIAGNOSIS — E440 Moderate protein-calorie malnutrition: Secondary | ICD-10-CM | POA: Diagnosis not present

## 2019-09-15 DIAGNOSIS — G4700 Insomnia, unspecified: Secondary | ICD-10-CM | POA: Diagnosis not present

## 2019-09-15 DIAGNOSIS — F331 Major depressive disorder, recurrent, moderate: Secondary | ICD-10-CM | POA: Diagnosis not present

## 2019-09-15 DIAGNOSIS — R2689 Other abnormalities of gait and mobility: Secondary | ICD-10-CM | POA: Diagnosis not present

## 2019-09-15 DIAGNOSIS — K219 Gastro-esophageal reflux disease without esophagitis: Secondary | ICD-10-CM | POA: Diagnosis not present

## 2019-09-15 DIAGNOSIS — R278 Other lack of coordination: Secondary | ICD-10-CM | POA: Diagnosis not present

## 2019-09-15 DIAGNOSIS — F0391 Unspecified dementia with behavioral disturbance: Secondary | ICD-10-CM | POA: Diagnosis not present

## 2019-09-15 DIAGNOSIS — R2681 Unsteadiness on feet: Secondary | ICD-10-CM | POA: Diagnosis not present

## 2019-09-16 DIAGNOSIS — I480 Paroxysmal atrial fibrillation: Secondary | ICD-10-CM | POA: Diagnosis not present

## 2019-09-16 DIAGNOSIS — R41841 Cognitive communication deficit: Secondary | ICD-10-CM | POA: Diagnosis not present

## 2019-09-16 DIAGNOSIS — I5032 Chronic diastolic (congestive) heart failure: Secondary | ICD-10-CM | POA: Diagnosis not present

## 2019-09-16 DIAGNOSIS — R278 Other lack of coordination: Secondary | ICD-10-CM | POA: Diagnosis not present

## 2019-09-16 DIAGNOSIS — R2681 Unsteadiness on feet: Secondary | ICD-10-CM | POA: Diagnosis not present

## 2019-09-16 DIAGNOSIS — E440 Moderate protein-calorie malnutrition: Secondary | ICD-10-CM | POA: Diagnosis not present

## 2019-09-16 DIAGNOSIS — Z20828 Contact with and (suspected) exposure to other viral communicable diseases: Secondary | ICD-10-CM | POA: Diagnosis not present

## 2019-09-16 DIAGNOSIS — R2689 Other abnormalities of gait and mobility: Secondary | ICD-10-CM | POA: Diagnosis not present

## 2019-09-16 DIAGNOSIS — K219 Gastro-esophageal reflux disease without esophagitis: Secondary | ICD-10-CM | POA: Diagnosis not present

## 2019-09-16 DIAGNOSIS — I739 Peripheral vascular disease, unspecified: Secondary | ICD-10-CM | POA: Diagnosis not present

## 2019-09-17 DIAGNOSIS — R278 Other lack of coordination: Secondary | ICD-10-CM | POA: Diagnosis not present

## 2019-09-17 DIAGNOSIS — I5032 Chronic diastolic (congestive) heart failure: Secondary | ICD-10-CM | POA: Diagnosis not present

## 2019-09-17 DIAGNOSIS — E440 Moderate protein-calorie malnutrition: Secondary | ICD-10-CM | POA: Diagnosis not present

## 2019-09-17 DIAGNOSIS — I739 Peripheral vascular disease, unspecified: Secondary | ICD-10-CM | POA: Diagnosis not present

## 2019-09-17 DIAGNOSIS — R2681 Unsteadiness on feet: Secondary | ICD-10-CM | POA: Diagnosis not present

## 2019-09-17 DIAGNOSIS — R41841 Cognitive communication deficit: Secondary | ICD-10-CM | POA: Diagnosis not present

## 2019-09-17 DIAGNOSIS — R2689 Other abnormalities of gait and mobility: Secondary | ICD-10-CM | POA: Diagnosis not present

## 2019-09-17 DIAGNOSIS — K219 Gastro-esophageal reflux disease without esophagitis: Secondary | ICD-10-CM | POA: Diagnosis not present

## 2019-09-17 DIAGNOSIS — I480 Paroxysmal atrial fibrillation: Secondary | ICD-10-CM | POA: Diagnosis not present

## 2019-09-18 DIAGNOSIS — R41841 Cognitive communication deficit: Secondary | ICD-10-CM | POA: Diagnosis not present

## 2019-09-18 DIAGNOSIS — R2689 Other abnormalities of gait and mobility: Secondary | ICD-10-CM | POA: Diagnosis not present

## 2019-09-18 DIAGNOSIS — K219 Gastro-esophageal reflux disease without esophagitis: Secondary | ICD-10-CM | POA: Diagnosis not present

## 2019-09-18 DIAGNOSIS — E440 Moderate protein-calorie malnutrition: Secondary | ICD-10-CM | POA: Diagnosis not present

## 2019-09-18 DIAGNOSIS — R278 Other lack of coordination: Secondary | ICD-10-CM | POA: Diagnosis not present

## 2019-09-18 DIAGNOSIS — I480 Paroxysmal atrial fibrillation: Secondary | ICD-10-CM | POA: Diagnosis not present

## 2019-09-18 DIAGNOSIS — I739 Peripheral vascular disease, unspecified: Secondary | ICD-10-CM | POA: Diagnosis not present

## 2019-09-18 DIAGNOSIS — R2681 Unsteadiness on feet: Secondary | ICD-10-CM | POA: Diagnosis not present

## 2019-09-18 DIAGNOSIS — I5032 Chronic diastolic (congestive) heart failure: Secondary | ICD-10-CM | POA: Diagnosis not present

## 2019-09-21 DIAGNOSIS — K219 Gastro-esophageal reflux disease without esophagitis: Secondary | ICD-10-CM | POA: Diagnosis not present

## 2019-09-21 DIAGNOSIS — I480 Paroxysmal atrial fibrillation: Secondary | ICD-10-CM | POA: Diagnosis not present

## 2019-09-21 DIAGNOSIS — R41841 Cognitive communication deficit: Secondary | ICD-10-CM | POA: Diagnosis not present

## 2019-09-21 DIAGNOSIS — R2689 Other abnormalities of gait and mobility: Secondary | ICD-10-CM | POA: Diagnosis not present

## 2019-09-21 DIAGNOSIS — I739 Peripheral vascular disease, unspecified: Secondary | ICD-10-CM | POA: Diagnosis not present

## 2019-09-21 DIAGNOSIS — R2681 Unsteadiness on feet: Secondary | ICD-10-CM | POA: Diagnosis not present

## 2019-09-21 DIAGNOSIS — I5032 Chronic diastolic (congestive) heart failure: Secondary | ICD-10-CM | POA: Diagnosis not present

## 2019-09-21 DIAGNOSIS — E440 Moderate protein-calorie malnutrition: Secondary | ICD-10-CM | POA: Diagnosis not present

## 2019-09-21 DIAGNOSIS — R278 Other lack of coordination: Secondary | ICD-10-CM | POA: Diagnosis not present

## 2019-09-22 DIAGNOSIS — Z20828 Contact with and (suspected) exposure to other viral communicable diseases: Secondary | ICD-10-CM | POA: Diagnosis not present

## 2019-09-22 DIAGNOSIS — I739 Peripheral vascular disease, unspecified: Secondary | ICD-10-CM | POA: Diagnosis not present

## 2019-09-22 DIAGNOSIS — R278 Other lack of coordination: Secondary | ICD-10-CM | POA: Diagnosis not present

## 2019-09-22 DIAGNOSIS — R2689 Other abnormalities of gait and mobility: Secondary | ICD-10-CM | POA: Diagnosis not present

## 2019-09-22 DIAGNOSIS — R2681 Unsteadiness on feet: Secondary | ICD-10-CM | POA: Diagnosis not present

## 2019-09-22 DIAGNOSIS — K219 Gastro-esophageal reflux disease without esophagitis: Secondary | ICD-10-CM | POA: Diagnosis not present

## 2019-09-22 DIAGNOSIS — R41841 Cognitive communication deficit: Secondary | ICD-10-CM | POA: Diagnosis not present

## 2019-09-22 DIAGNOSIS — I5032 Chronic diastolic (congestive) heart failure: Secondary | ICD-10-CM | POA: Diagnosis not present

## 2019-09-22 DIAGNOSIS — I480 Paroxysmal atrial fibrillation: Secondary | ICD-10-CM | POA: Diagnosis not present

## 2019-09-22 DIAGNOSIS — E440 Moderate protein-calorie malnutrition: Secondary | ICD-10-CM | POA: Diagnosis not present

## 2019-09-23 DIAGNOSIS — I5032 Chronic diastolic (congestive) heart failure: Secondary | ICD-10-CM | POA: Diagnosis not present

## 2019-09-23 DIAGNOSIS — R2681 Unsteadiness on feet: Secondary | ICD-10-CM | POA: Diagnosis not present

## 2019-09-23 DIAGNOSIS — E440 Moderate protein-calorie malnutrition: Secondary | ICD-10-CM | POA: Diagnosis not present

## 2019-09-23 DIAGNOSIS — R41841 Cognitive communication deficit: Secondary | ICD-10-CM | POA: Diagnosis not present

## 2019-09-23 DIAGNOSIS — R2689 Other abnormalities of gait and mobility: Secondary | ICD-10-CM | POA: Diagnosis not present

## 2019-09-23 DIAGNOSIS — I480 Paroxysmal atrial fibrillation: Secondary | ICD-10-CM | POA: Diagnosis not present

## 2019-09-23 DIAGNOSIS — K219 Gastro-esophageal reflux disease without esophagitis: Secondary | ICD-10-CM | POA: Diagnosis not present

## 2019-09-23 DIAGNOSIS — R278 Other lack of coordination: Secondary | ICD-10-CM | POA: Diagnosis not present

## 2019-09-23 DIAGNOSIS — I739 Peripheral vascular disease, unspecified: Secondary | ICD-10-CM | POA: Diagnosis not present

## 2019-09-28 DIAGNOSIS — Z20828 Contact with and (suspected) exposure to other viral communicable diseases: Secondary | ICD-10-CM | POA: Diagnosis not present

## 2019-09-29 DIAGNOSIS — G894 Chronic pain syndrome: Secondary | ICD-10-CM | POA: Diagnosis not present

## 2019-09-29 DIAGNOSIS — F331 Major depressive disorder, recurrent, moderate: Secondary | ICD-10-CM | POA: Diagnosis not present

## 2019-09-29 DIAGNOSIS — F0391 Unspecified dementia with behavioral disturbance: Secondary | ICD-10-CM | POA: Diagnosis not present

## 2019-09-29 DIAGNOSIS — G4700 Insomnia, unspecified: Secondary | ICD-10-CM | POA: Diagnosis not present

## 2019-10-01 DIAGNOSIS — I480 Paroxysmal atrial fibrillation: Secondary | ICD-10-CM | POA: Diagnosis not present

## 2019-10-01 DIAGNOSIS — M542 Cervicalgia: Secondary | ICD-10-CM | POA: Diagnosis not present

## 2019-10-01 DIAGNOSIS — R296 Repeated falls: Secondary | ICD-10-CM | POA: Diagnosis not present

## 2019-10-01 DIAGNOSIS — W19XXXA Unspecified fall, initial encounter: Secondary | ICD-10-CM | POA: Diagnosis not present

## 2019-10-01 DIAGNOSIS — F419 Anxiety disorder, unspecified: Secondary | ICD-10-CM | POA: Diagnosis not present

## 2019-10-01 DIAGNOSIS — I739 Peripheral vascular disease, unspecified: Secondary | ICD-10-CM | POA: Diagnosis not present

## 2019-10-01 DIAGNOSIS — I5032 Chronic diastolic (congestive) heart failure: Secondary | ICD-10-CM | POA: Diagnosis not present

## 2019-10-06 DIAGNOSIS — Z20828 Contact with and (suspected) exposure to other viral communicable diseases: Secondary | ICD-10-CM | POA: Diagnosis not present

## 2019-10-08 DIAGNOSIS — I69891 Dysphagia following other cerebrovascular disease: Secondary | ICD-10-CM | POA: Diagnosis not present

## 2019-10-08 DIAGNOSIS — I5032 Chronic diastolic (congestive) heart failure: Secondary | ICD-10-CM | POA: Diagnosis not present

## 2019-10-08 DIAGNOSIS — I2510 Atherosclerotic heart disease of native coronary artery without angina pectoris: Secondary | ICD-10-CM | POA: Diagnosis not present

## 2019-10-08 DIAGNOSIS — S72141D Displaced intertrochanteric fracture of right femur, subsequent encounter for closed fracture with routine healing: Secondary | ICD-10-CM | POA: Diagnosis not present

## 2019-10-13 DIAGNOSIS — F0391 Unspecified dementia with behavioral disturbance: Secondary | ICD-10-CM | POA: Diagnosis not present

## 2019-10-13 DIAGNOSIS — G894 Chronic pain syndrome: Secondary | ICD-10-CM | POA: Diagnosis not present

## 2019-10-13 DIAGNOSIS — G4700 Insomnia, unspecified: Secondary | ICD-10-CM | POA: Diagnosis not present

## 2019-10-13 DIAGNOSIS — F331 Major depressive disorder, recurrent, moderate: Secondary | ICD-10-CM | POA: Diagnosis not present

## 2019-10-22 DIAGNOSIS — M545 Low back pain: Secondary | ICD-10-CM | POA: Diagnosis not present

## 2019-10-22 DIAGNOSIS — I5032 Chronic diastolic (congestive) heart failure: Secondary | ICD-10-CM | POA: Diagnosis not present

## 2019-10-22 DIAGNOSIS — I2510 Atherosclerotic heart disease of native coronary artery without angina pectoris: Secondary | ICD-10-CM | POA: Diagnosis not present

## 2019-10-22 DIAGNOSIS — R296 Repeated falls: Secondary | ICD-10-CM | POA: Diagnosis not present

## 2019-10-22 DIAGNOSIS — I480 Paroxysmal atrial fibrillation: Secondary | ICD-10-CM | POA: Diagnosis not present

## 2019-10-22 DIAGNOSIS — F419 Anxiety disorder, unspecified: Secondary | ICD-10-CM | POA: Diagnosis not present

## 2019-10-22 DIAGNOSIS — K219 Gastro-esophageal reflux disease without esophagitis: Secondary | ICD-10-CM | POA: Diagnosis not present

## 2019-10-31 DIAGNOSIS — Z03818 Encounter for observation for suspected exposure to other biological agents ruled out: Secondary | ICD-10-CM | POA: Diagnosis not present

## 2019-11-03 DIAGNOSIS — F0391 Unspecified dementia with behavioral disturbance: Secondary | ICD-10-CM | POA: Diagnosis not present

## 2019-11-03 DIAGNOSIS — F331 Major depressive disorder, recurrent, moderate: Secondary | ICD-10-CM | POA: Diagnosis not present

## 2019-11-03 DIAGNOSIS — G4700 Insomnia, unspecified: Secondary | ICD-10-CM | POA: Diagnosis not present

## 2019-11-03 DIAGNOSIS — G894 Chronic pain syndrome: Secondary | ICD-10-CM | POA: Diagnosis not present

## 2019-11-04 DIAGNOSIS — Z20828 Contact with and (suspected) exposure to other viral communicable diseases: Secondary | ICD-10-CM | POA: Diagnosis not present

## 2019-11-08 DIAGNOSIS — I2510 Atherosclerotic heart disease of native coronary artery without angina pectoris: Secondary | ICD-10-CM | POA: Diagnosis not present

## 2019-11-08 DIAGNOSIS — S72141D Displaced intertrochanteric fracture of right femur, subsequent encounter for closed fracture with routine healing: Secondary | ICD-10-CM | POA: Diagnosis not present

## 2019-11-08 DIAGNOSIS — I69891 Dysphagia following other cerebrovascular disease: Secondary | ICD-10-CM | POA: Diagnosis not present

## 2019-11-08 DIAGNOSIS — I5032 Chronic diastolic (congestive) heart failure: Secondary | ICD-10-CM | POA: Diagnosis not present

## 2019-11-10 DIAGNOSIS — Z03818 Encounter for observation for suspected exposure to other biological agents ruled out: Secondary | ICD-10-CM | POA: Diagnosis not present

## 2019-11-11 DIAGNOSIS — M25559 Pain in unspecified hip: Secondary | ICD-10-CM | POA: Diagnosis not present

## 2019-11-11 DIAGNOSIS — S72009A Fracture of unspecified part of neck of unspecified femur, initial encounter for closed fracture: Secondary | ICD-10-CM | POA: Diagnosis not present

## 2019-11-11 DIAGNOSIS — I2510 Atherosclerotic heart disease of native coronary artery without angina pectoris: Secondary | ICD-10-CM | POA: Diagnosis not present

## 2019-11-11 DIAGNOSIS — I509 Heart failure, unspecified: Secondary | ICD-10-CM | POA: Diagnosis not present

## 2019-11-19 DIAGNOSIS — F331 Major depressive disorder, recurrent, moderate: Secondary | ICD-10-CM | POA: Diagnosis not present

## 2019-11-20 DIAGNOSIS — I5032 Chronic diastolic (congestive) heart failure: Secondary | ICD-10-CM | POA: Diagnosis not present

## 2019-11-20 DIAGNOSIS — K219 Gastro-esophageal reflux disease without esophagitis: Secondary | ICD-10-CM | POA: Diagnosis not present

## 2019-11-20 DIAGNOSIS — F419 Anxiety disorder, unspecified: Secondary | ICD-10-CM | POA: Diagnosis not present

## 2019-11-20 DIAGNOSIS — I480 Paroxysmal atrial fibrillation: Secondary | ICD-10-CM | POA: Diagnosis not present

## 2019-11-22 DIAGNOSIS — I2510 Atherosclerotic heart disease of native coronary artery without angina pectoris: Secondary | ICD-10-CM | POA: Diagnosis not present

## 2019-11-22 DIAGNOSIS — Z8673 Personal history of transient ischemic attack (TIA), and cerebral infarction without residual deficits: Secondary | ICD-10-CM | POA: Diagnosis not present

## 2019-11-22 DIAGNOSIS — R296 Repeated falls: Secondary | ICD-10-CM | POA: Diagnosis not present

## 2019-11-22 DIAGNOSIS — I480 Paroxysmal atrial fibrillation: Secondary | ICD-10-CM | POA: Diagnosis not present

## 2019-11-22 DIAGNOSIS — K219 Gastro-esophageal reflux disease without esophagitis: Secondary | ICD-10-CM | POA: Diagnosis not present

## 2019-11-22 DIAGNOSIS — F419 Anxiety disorder, unspecified: Secondary | ICD-10-CM | POA: Diagnosis not present

## 2019-11-22 DIAGNOSIS — I5032 Chronic diastolic (congestive) heart failure: Secondary | ICD-10-CM | POA: Diagnosis not present

## 2019-11-26 DIAGNOSIS — F331 Major depressive disorder, recurrent, moderate: Secondary | ICD-10-CM | POA: Diagnosis not present

## 2019-12-02 DIAGNOSIS — F331 Major depressive disorder, recurrent, moderate: Secondary | ICD-10-CM | POA: Diagnosis not present

## 2019-12-02 DIAGNOSIS — G4700 Insomnia, unspecified: Secondary | ICD-10-CM | POA: Diagnosis not present

## 2019-12-02 DIAGNOSIS — F0391 Unspecified dementia with behavioral disturbance: Secondary | ICD-10-CM | POA: Diagnosis not present

## 2019-12-03 DIAGNOSIS — F331 Major depressive disorder, recurrent, moderate: Secondary | ICD-10-CM | POA: Diagnosis not present

## 2019-12-09 DIAGNOSIS — R41841 Cognitive communication deficit: Secondary | ICD-10-CM | POA: Diagnosis not present

## 2019-12-09 DIAGNOSIS — K219 Gastro-esophageal reflux disease without esophagitis: Secondary | ICD-10-CM | POA: Diagnosis not present

## 2019-12-09 DIAGNOSIS — R2689 Other abnormalities of gait and mobility: Secondary | ICD-10-CM | POA: Diagnosis not present

## 2019-12-09 DIAGNOSIS — R2681 Unsteadiness on feet: Secondary | ICD-10-CM | POA: Diagnosis not present

## 2019-12-09 DIAGNOSIS — E440 Moderate protein-calorie malnutrition: Secondary | ICD-10-CM | POA: Diagnosis not present

## 2019-12-09 DIAGNOSIS — I480 Paroxysmal atrial fibrillation: Secondary | ICD-10-CM | POA: Diagnosis not present

## 2019-12-09 DIAGNOSIS — I739 Peripheral vascular disease, unspecified: Secondary | ICD-10-CM | POA: Diagnosis not present

## 2019-12-09 DIAGNOSIS — I5032 Chronic diastolic (congestive) heart failure: Secondary | ICD-10-CM | POA: Diagnosis not present

## 2019-12-09 DIAGNOSIS — R278 Other lack of coordination: Secondary | ICD-10-CM | POA: Diagnosis not present

## 2019-12-10 DIAGNOSIS — F331 Major depressive disorder, recurrent, moderate: Secondary | ICD-10-CM | POA: Diagnosis not present

## 2019-12-10 DIAGNOSIS — I5032 Chronic diastolic (congestive) heart failure: Secondary | ICD-10-CM | POA: Diagnosis not present

## 2019-12-10 DIAGNOSIS — E440 Moderate protein-calorie malnutrition: Secondary | ICD-10-CM | POA: Diagnosis not present

## 2019-12-10 DIAGNOSIS — R2681 Unsteadiness on feet: Secondary | ICD-10-CM | POA: Diagnosis not present

## 2019-12-10 DIAGNOSIS — K219 Gastro-esophageal reflux disease without esophagitis: Secondary | ICD-10-CM | POA: Diagnosis not present

## 2019-12-10 DIAGNOSIS — I739 Peripheral vascular disease, unspecified: Secondary | ICD-10-CM | POA: Diagnosis not present

## 2019-12-10 DIAGNOSIS — R278 Other lack of coordination: Secondary | ICD-10-CM | POA: Diagnosis not present

## 2019-12-10 DIAGNOSIS — R2689 Other abnormalities of gait and mobility: Secondary | ICD-10-CM | POA: Diagnosis not present

## 2019-12-10 DIAGNOSIS — R41841 Cognitive communication deficit: Secondary | ICD-10-CM | POA: Diagnosis not present

## 2019-12-10 DIAGNOSIS — I480 Paroxysmal atrial fibrillation: Secondary | ICD-10-CM | POA: Diagnosis not present

## 2019-12-11 DIAGNOSIS — I5032 Chronic diastolic (congestive) heart failure: Secondary | ICD-10-CM | POA: Diagnosis not present

## 2019-12-11 DIAGNOSIS — E440 Moderate protein-calorie malnutrition: Secondary | ICD-10-CM | POA: Diagnosis not present

## 2019-12-11 DIAGNOSIS — I480 Paroxysmal atrial fibrillation: Secondary | ICD-10-CM | POA: Diagnosis not present

## 2019-12-11 DIAGNOSIS — R278 Other lack of coordination: Secondary | ICD-10-CM | POA: Diagnosis not present

## 2019-12-11 DIAGNOSIS — R2689 Other abnormalities of gait and mobility: Secondary | ICD-10-CM | POA: Diagnosis not present

## 2019-12-11 DIAGNOSIS — R41841 Cognitive communication deficit: Secondary | ICD-10-CM | POA: Diagnosis not present

## 2019-12-11 DIAGNOSIS — R2681 Unsteadiness on feet: Secondary | ICD-10-CM | POA: Diagnosis not present

## 2019-12-11 DIAGNOSIS — I739 Peripheral vascular disease, unspecified: Secondary | ICD-10-CM | POA: Diagnosis not present

## 2019-12-11 DIAGNOSIS — K219 Gastro-esophageal reflux disease without esophagitis: Secondary | ICD-10-CM | POA: Diagnosis not present

## 2019-12-14 DIAGNOSIS — I480 Paroxysmal atrial fibrillation: Secondary | ICD-10-CM | POA: Diagnosis not present

## 2019-12-14 DIAGNOSIS — K219 Gastro-esophageal reflux disease without esophagitis: Secondary | ICD-10-CM | POA: Diagnosis not present

## 2019-12-14 DIAGNOSIS — I739 Peripheral vascular disease, unspecified: Secondary | ICD-10-CM | POA: Diagnosis not present

## 2019-12-14 DIAGNOSIS — G301 Alzheimer's disease with late onset: Secondary | ICD-10-CM | POA: Diagnosis not present

## 2019-12-14 DIAGNOSIS — R278 Other lack of coordination: Secondary | ICD-10-CM | POA: Diagnosis not present

## 2019-12-14 DIAGNOSIS — R41841 Cognitive communication deficit: Secondary | ICD-10-CM | POA: Diagnosis not present

## 2019-12-14 DIAGNOSIS — F0150 Vascular dementia without behavioral disturbance: Secondary | ICD-10-CM | POA: Diagnosis not present

## 2019-12-14 DIAGNOSIS — F331 Major depressive disorder, recurrent, moderate: Secondary | ICD-10-CM | POA: Diagnosis not present

## 2019-12-14 DIAGNOSIS — E440 Moderate protein-calorie malnutrition: Secondary | ICD-10-CM | POA: Diagnosis not present

## 2019-12-14 DIAGNOSIS — I5032 Chronic diastolic (congestive) heart failure: Secondary | ICD-10-CM | POA: Diagnosis not present

## 2019-12-14 DIAGNOSIS — R2689 Other abnormalities of gait and mobility: Secondary | ICD-10-CM | POA: Diagnosis not present

## 2019-12-14 DIAGNOSIS — R2681 Unsteadiness on feet: Secondary | ICD-10-CM | POA: Diagnosis not present

## 2019-12-15 DIAGNOSIS — R2681 Unsteadiness on feet: Secondary | ICD-10-CM | POA: Diagnosis not present

## 2019-12-15 DIAGNOSIS — I480 Paroxysmal atrial fibrillation: Secondary | ICD-10-CM | POA: Diagnosis not present

## 2019-12-15 DIAGNOSIS — E440 Moderate protein-calorie malnutrition: Secondary | ICD-10-CM | POA: Diagnosis not present

## 2019-12-15 DIAGNOSIS — R41841 Cognitive communication deficit: Secondary | ICD-10-CM | POA: Diagnosis not present

## 2019-12-15 DIAGNOSIS — R278 Other lack of coordination: Secondary | ICD-10-CM | POA: Diagnosis not present

## 2019-12-15 DIAGNOSIS — I739 Peripheral vascular disease, unspecified: Secondary | ICD-10-CM | POA: Diagnosis not present

## 2019-12-15 DIAGNOSIS — I5032 Chronic diastolic (congestive) heart failure: Secondary | ICD-10-CM | POA: Diagnosis not present

## 2019-12-15 DIAGNOSIS — R2689 Other abnormalities of gait and mobility: Secondary | ICD-10-CM | POA: Diagnosis not present

## 2019-12-15 DIAGNOSIS — K219 Gastro-esophageal reflux disease without esophagitis: Secondary | ICD-10-CM | POA: Diagnosis not present

## 2019-12-16 DIAGNOSIS — R2681 Unsteadiness on feet: Secondary | ICD-10-CM | POA: Diagnosis not present

## 2019-12-16 DIAGNOSIS — I5032 Chronic diastolic (congestive) heart failure: Secondary | ICD-10-CM | POA: Diagnosis not present

## 2019-12-16 DIAGNOSIS — E440 Moderate protein-calorie malnutrition: Secondary | ICD-10-CM | POA: Diagnosis not present

## 2019-12-16 DIAGNOSIS — R41841 Cognitive communication deficit: Secondary | ICD-10-CM | POA: Diagnosis not present

## 2019-12-16 DIAGNOSIS — R2689 Other abnormalities of gait and mobility: Secondary | ICD-10-CM | POA: Diagnosis not present

## 2019-12-16 DIAGNOSIS — I480 Paroxysmal atrial fibrillation: Secondary | ICD-10-CM | POA: Diagnosis not present

## 2019-12-16 DIAGNOSIS — R278 Other lack of coordination: Secondary | ICD-10-CM | POA: Diagnosis not present

## 2019-12-16 DIAGNOSIS — I739 Peripheral vascular disease, unspecified: Secondary | ICD-10-CM | POA: Diagnosis not present

## 2019-12-16 DIAGNOSIS — K219 Gastro-esophageal reflux disease without esophagitis: Secondary | ICD-10-CM | POA: Diagnosis not present

## 2019-12-17 DIAGNOSIS — I5032 Chronic diastolic (congestive) heart failure: Secondary | ICD-10-CM | POA: Diagnosis not present

## 2019-12-17 DIAGNOSIS — I739 Peripheral vascular disease, unspecified: Secondary | ICD-10-CM | POA: Diagnosis not present

## 2019-12-17 DIAGNOSIS — R41841 Cognitive communication deficit: Secondary | ICD-10-CM | POA: Diagnosis not present

## 2019-12-17 DIAGNOSIS — E440 Moderate protein-calorie malnutrition: Secondary | ICD-10-CM | POA: Diagnosis not present

## 2019-12-17 DIAGNOSIS — R2681 Unsteadiness on feet: Secondary | ICD-10-CM | POA: Diagnosis not present

## 2019-12-17 DIAGNOSIS — F331 Major depressive disorder, recurrent, moderate: Secondary | ICD-10-CM | POA: Diagnosis not present

## 2019-12-17 DIAGNOSIS — R2689 Other abnormalities of gait and mobility: Secondary | ICD-10-CM | POA: Diagnosis not present

## 2019-12-17 DIAGNOSIS — I480 Paroxysmal atrial fibrillation: Secondary | ICD-10-CM | POA: Diagnosis not present

## 2019-12-17 DIAGNOSIS — K219 Gastro-esophageal reflux disease without esophagitis: Secondary | ICD-10-CM | POA: Diagnosis not present

## 2019-12-17 DIAGNOSIS — R278 Other lack of coordination: Secondary | ICD-10-CM | POA: Diagnosis not present

## 2019-12-18 DIAGNOSIS — R278 Other lack of coordination: Secondary | ICD-10-CM | POA: Diagnosis not present

## 2019-12-18 DIAGNOSIS — K219 Gastro-esophageal reflux disease without esophagitis: Secondary | ICD-10-CM | POA: Diagnosis not present

## 2019-12-18 DIAGNOSIS — E440 Moderate protein-calorie malnutrition: Secondary | ICD-10-CM | POA: Diagnosis not present

## 2019-12-18 DIAGNOSIS — R2689 Other abnormalities of gait and mobility: Secondary | ICD-10-CM | POA: Diagnosis not present

## 2019-12-18 DIAGNOSIS — I739 Peripheral vascular disease, unspecified: Secondary | ICD-10-CM | POA: Diagnosis not present

## 2019-12-18 DIAGNOSIS — I480 Paroxysmal atrial fibrillation: Secondary | ICD-10-CM | POA: Diagnosis not present

## 2019-12-18 DIAGNOSIS — R41841 Cognitive communication deficit: Secondary | ICD-10-CM | POA: Diagnosis not present

## 2019-12-18 DIAGNOSIS — I5032 Chronic diastolic (congestive) heart failure: Secondary | ICD-10-CM | POA: Diagnosis not present

## 2019-12-18 DIAGNOSIS — R2681 Unsteadiness on feet: Secondary | ICD-10-CM | POA: Diagnosis not present

## 2019-12-21 DIAGNOSIS — E440 Moderate protein-calorie malnutrition: Secondary | ICD-10-CM | POA: Diagnosis not present

## 2019-12-21 DIAGNOSIS — I5032 Chronic diastolic (congestive) heart failure: Secondary | ICD-10-CM | POA: Diagnosis not present

## 2019-12-21 DIAGNOSIS — I480 Paroxysmal atrial fibrillation: Secondary | ICD-10-CM | POA: Diagnosis not present

## 2019-12-21 DIAGNOSIS — R2689 Other abnormalities of gait and mobility: Secondary | ICD-10-CM | POA: Diagnosis not present

## 2019-12-21 DIAGNOSIS — R2681 Unsteadiness on feet: Secondary | ICD-10-CM | POA: Diagnosis not present

## 2019-12-21 DIAGNOSIS — I739 Peripheral vascular disease, unspecified: Secondary | ICD-10-CM | POA: Diagnosis not present

## 2019-12-21 DIAGNOSIS — R41841 Cognitive communication deficit: Secondary | ICD-10-CM | POA: Diagnosis not present

## 2019-12-21 DIAGNOSIS — Q845 Enlarged and hypertrophic nails: Secondary | ICD-10-CM | POA: Diagnosis not present

## 2019-12-21 DIAGNOSIS — R278 Other lack of coordination: Secondary | ICD-10-CM | POA: Diagnosis not present

## 2019-12-21 DIAGNOSIS — F331 Major depressive disorder, recurrent, moderate: Secondary | ICD-10-CM | POA: Diagnosis not present

## 2019-12-21 DIAGNOSIS — K219 Gastro-esophageal reflux disease without esophagitis: Secondary | ICD-10-CM | POA: Diagnosis not present

## 2019-12-21 DIAGNOSIS — B351 Tinea unguium: Secondary | ICD-10-CM | POA: Diagnosis not present

## 2019-12-22 DIAGNOSIS — R278 Other lack of coordination: Secondary | ICD-10-CM | POA: Diagnosis not present

## 2019-12-22 DIAGNOSIS — M25571 Pain in right ankle and joints of right foot: Secondary | ICD-10-CM | POA: Diagnosis not present

## 2019-12-22 DIAGNOSIS — R41841 Cognitive communication deficit: Secondary | ICD-10-CM | POA: Diagnosis not present

## 2019-12-22 DIAGNOSIS — K219 Gastro-esophageal reflux disease without esophagitis: Secondary | ICD-10-CM | POA: Diagnosis not present

## 2019-12-22 DIAGNOSIS — I480 Paroxysmal atrial fibrillation: Secondary | ICD-10-CM | POA: Diagnosis not present

## 2019-12-22 DIAGNOSIS — I739 Peripheral vascular disease, unspecified: Secondary | ICD-10-CM | POA: Diagnosis not present

## 2019-12-22 DIAGNOSIS — I5032 Chronic diastolic (congestive) heart failure: Secondary | ICD-10-CM | POA: Diagnosis not present

## 2019-12-22 DIAGNOSIS — R2689 Other abnormalities of gait and mobility: Secondary | ICD-10-CM | POA: Diagnosis not present

## 2019-12-22 DIAGNOSIS — R2681 Unsteadiness on feet: Secondary | ICD-10-CM | POA: Diagnosis not present

## 2019-12-22 DIAGNOSIS — E440 Moderate protein-calorie malnutrition: Secondary | ICD-10-CM | POA: Diagnosis not present

## 2019-12-23 DIAGNOSIS — Z03818 Encounter for observation for suspected exposure to other biological agents ruled out: Secondary | ICD-10-CM | POA: Diagnosis not present

## 2019-12-27 ENCOUNTER — Other Ambulatory Visit: Payer: Self-pay

## 2019-12-27 ENCOUNTER — Emergency Department (HOSPITAL_COMMUNITY): Payer: Medicare PPO

## 2019-12-27 ENCOUNTER — Inpatient Hospital Stay (HOSPITAL_COMMUNITY): Payer: Medicare PPO

## 2019-12-27 ENCOUNTER — Encounter (HOSPITAL_COMMUNITY): Payer: Self-pay | Admitting: *Deleted

## 2019-12-27 ENCOUNTER — Inpatient Hospital Stay (HOSPITAL_COMMUNITY)
Admission: EM | Admit: 2019-12-27 | Discharge: 2020-01-03 | DRG: 871 | Disposition: A | Discharge status: Skilled Nursing Facility | Payer: Medicare PPO | Source: Skilled Nursing Facility | Attending: Family Medicine | Admitting: Family Medicine

## 2019-12-27 DIAGNOSIS — Z7189 Other specified counseling: Secondary | ICD-10-CM | POA: Diagnosis not present

## 2019-12-27 DIAGNOSIS — Z8673 Personal history of transient ischemic attack (TIA), and cerebral infarction without residual deficits: Secondary | ICD-10-CM | POA: Diagnosis not present

## 2019-12-27 DIAGNOSIS — D649 Anemia, unspecified: Secondary | ICD-10-CM | POA: Diagnosis present

## 2019-12-27 DIAGNOSIS — E869 Volume depletion, unspecified: Secondary | ICD-10-CM | POA: Diagnosis present

## 2019-12-27 DIAGNOSIS — Z951 Presence of aortocoronary bypass graft: Secondary | ICD-10-CM | POA: Diagnosis not present

## 2019-12-27 DIAGNOSIS — R6521 Severe sepsis with septic shock: Secondary | ICD-10-CM | POA: Diagnosis not present

## 2019-12-27 DIAGNOSIS — E782 Mixed hyperlipidemia: Secondary | ICD-10-CM | POA: Diagnosis present

## 2019-12-27 DIAGNOSIS — I110 Hypertensive heart disease with heart failure: Secondary | ICD-10-CM | POA: Diagnosis present

## 2019-12-27 DIAGNOSIS — E876 Hypokalemia: Secondary | ICD-10-CM | POA: Diagnosis present

## 2019-12-27 DIAGNOSIS — G9341 Metabolic encephalopathy: Secondary | ICD-10-CM

## 2019-12-27 DIAGNOSIS — K219 Gastro-esophageal reflux disease without esophagitis: Secondary | ICD-10-CM | POA: Diagnosis not present

## 2019-12-27 DIAGNOSIS — N201 Calculus of ureter: Secondary | ICD-10-CM

## 2019-12-27 DIAGNOSIS — R410 Disorientation, unspecified: Secondary | ICD-10-CM | POA: Diagnosis not present

## 2019-12-27 DIAGNOSIS — Z79899 Other long term (current) drug therapy: Secondary | ICD-10-CM

## 2019-12-27 DIAGNOSIS — I480 Paroxysmal atrial fibrillation: Secondary | ICD-10-CM | POA: Diagnosis not present

## 2019-12-27 DIAGNOSIS — R64 Cachexia: Secondary | ICD-10-CM | POA: Diagnosis present

## 2019-12-27 DIAGNOSIS — Z20822 Contact with and (suspected) exposure to covid-19: Secondary | ICD-10-CM | POA: Diagnosis present

## 2019-12-27 DIAGNOSIS — E43 Unspecified severe protein-calorie malnutrition: Secondary | ICD-10-CM | POA: Diagnosis not present

## 2019-12-27 DIAGNOSIS — N136 Pyonephrosis: Secondary | ICD-10-CM | POA: Diagnosis present

## 2019-12-27 DIAGNOSIS — K449 Diaphragmatic hernia without obstruction or gangrene: Secondary | ICD-10-CM | POA: Diagnosis not present

## 2019-12-27 DIAGNOSIS — Z9049 Acquired absence of other specified parts of digestive tract: Secondary | ICD-10-CM

## 2019-12-27 DIAGNOSIS — I248 Other forms of acute ischemic heart disease: Secondary | ICD-10-CM | POA: Diagnosis present

## 2019-12-27 DIAGNOSIS — Z515 Encounter for palliative care: Secondary | ICD-10-CM

## 2019-12-27 DIAGNOSIS — Z681 Body mass index (BMI) 19 or less, adult: Secondary | ICD-10-CM

## 2019-12-27 DIAGNOSIS — I2510 Atherosclerotic heart disease of native coronary artery without angina pectoris: Secondary | ICD-10-CM | POA: Diagnosis present

## 2019-12-27 DIAGNOSIS — R404 Transient alteration of awareness: Secondary | ICD-10-CM | POA: Diagnosis not present

## 2019-12-27 DIAGNOSIS — Z96641 Presence of right artificial hip joint: Secondary | ICD-10-CM | POA: Diagnosis present

## 2019-12-27 DIAGNOSIS — J9690 Respiratory failure, unspecified, unspecified whether with hypoxia or hypercapnia: Secondary | ICD-10-CM | POA: Diagnosis not present

## 2019-12-27 DIAGNOSIS — I5042 Chronic combined systolic (congestive) and diastolic (congestive) heart failure: Secondary | ICD-10-CM | POA: Diagnosis present

## 2019-12-27 DIAGNOSIS — R6520 Severe sepsis without septic shock: Secondary | ICD-10-CM | POA: Diagnosis not present

## 2019-12-27 DIAGNOSIS — R778 Other specified abnormalities of plasma proteins: Secondary | ICD-10-CM | POA: Diagnosis not present

## 2019-12-27 DIAGNOSIS — G92 Toxic encephalopathy: Secondary | ICD-10-CM | POA: Diagnosis not present

## 2019-12-27 DIAGNOSIS — F39 Unspecified mood [affective] disorder: Secondary | ICD-10-CM | POA: Diagnosis present

## 2019-12-27 DIAGNOSIS — E162 Hypoglycemia, unspecified: Secondary | ICD-10-CM | POA: Diagnosis not present

## 2019-12-27 DIAGNOSIS — A419 Sepsis, unspecified organism: Secondary | ICD-10-CM | POA: Diagnosis not present

## 2019-12-27 DIAGNOSIS — E161 Other hypoglycemia: Secondary | ICD-10-CM | POA: Diagnosis not present

## 2019-12-27 DIAGNOSIS — I10 Essential (primary) hypertension: Secondary | ICD-10-CM | POA: Diagnosis not present

## 2019-12-27 DIAGNOSIS — R1310 Dysphagia, unspecified: Secondary | ICD-10-CM | POA: Diagnosis present

## 2019-12-27 DIAGNOSIS — Z13228 Encounter for screening for other metabolic disorders: Secondary | ICD-10-CM | POA: Diagnosis not present

## 2019-12-27 DIAGNOSIS — Z66 Do not resuscitate: Secondary | ICD-10-CM | POA: Diagnosis not present

## 2019-12-27 DIAGNOSIS — R1031 Right lower quadrant pain: Secondary | ICD-10-CM | POA: Diagnosis not present

## 2019-12-27 DIAGNOSIS — U071 COVID-19: Secondary | ICD-10-CM | POA: Diagnosis not present

## 2019-12-27 DIAGNOSIS — R7989 Other specified abnormal findings of blood chemistry: Secondary | ICD-10-CM | POA: Diagnosis present

## 2019-12-27 DIAGNOSIS — Z7982 Long term (current) use of aspirin: Secondary | ICD-10-CM | POA: Diagnosis not present

## 2019-12-27 DIAGNOSIS — I5031 Acute diastolic (congestive) heart failure: Secondary | ICD-10-CM | POA: Diagnosis not present

## 2019-12-27 DIAGNOSIS — R296 Repeated falls: Secondary | ICD-10-CM | POA: Diagnosis present

## 2019-12-27 DIAGNOSIS — A4150 Gram-negative sepsis, unspecified: Principal | ICD-10-CM | POA: Diagnosis present

## 2019-12-27 DIAGNOSIS — Z936 Other artificial openings of urinary tract status: Secondary | ICD-10-CM | POA: Diagnosis not present

## 2019-12-27 DIAGNOSIS — Z743 Need for continuous supervision: Secondary | ICD-10-CM | POA: Diagnosis not present

## 2019-12-27 DIAGNOSIS — M2550 Pain in unspecified joint: Secondary | ICD-10-CM | POA: Diagnosis not present

## 2019-12-27 DIAGNOSIS — R0689 Other abnormalities of breathing: Secondary | ICD-10-CM | POA: Diagnosis not present

## 2019-12-27 DIAGNOSIS — E872 Acidosis, unspecified: Secondary | ICD-10-CM | POA: Diagnosis present

## 2019-12-27 DIAGNOSIS — Z7401 Bed confinement status: Secondary | ICD-10-CM | POA: Diagnosis not present

## 2019-12-27 DIAGNOSIS — N132 Hydronephrosis with renal and ureteral calculous obstruction: Secondary | ICD-10-CM | POA: Diagnosis not present

## 2019-12-27 DIAGNOSIS — R0902 Hypoxemia: Secondary | ICD-10-CM | POA: Diagnosis not present

## 2019-12-27 DIAGNOSIS — N179 Acute kidney failure, unspecified: Secondary | ICD-10-CM | POA: Diagnosis present

## 2019-12-27 DIAGNOSIS — I739 Peripheral vascular disease, unspecified: Secondary | ICD-10-CM | POA: Diagnosis present

## 2019-12-27 DIAGNOSIS — R0602 Shortness of breath: Secondary | ICD-10-CM | POA: Diagnosis not present

## 2019-12-27 DIAGNOSIS — I255 Ischemic cardiomyopathy: Secondary | ICD-10-CM | POA: Diagnosis present

## 2019-12-27 DIAGNOSIS — I639 Cerebral infarction, unspecified: Secondary | ICD-10-CM

## 2019-12-27 HISTORY — PX: IR NEPHROSTOMY PLACEMENT RIGHT: IMG6064

## 2019-12-27 LAB — I-STAT ARTERIAL BLOOD GAS, ED
Acid-base deficit: 1 mmol/L (ref 0.0–2.0)
Bicarbonate: 25.2 mmol/L (ref 20.0–28.0)
Calcium, Ion: 1.25 mmol/L (ref 1.15–1.40)
HCT: 32 % — ABNORMAL LOW (ref 36.0–46.0)
Hemoglobin: 10.9 g/dL — ABNORMAL LOW (ref 12.0–15.0)
O2 Saturation: 96 %
Patient temperature: 98.1
Potassium: 4.1 mmol/L (ref 3.5–5.1)
Sodium: 139 mmol/L (ref 135–145)
TCO2: 27 mmol/L (ref 22–32)
pCO2 arterial: 48.4 mmHg — ABNORMAL HIGH (ref 32.0–48.0)
pH, Arterial: 7.323 — ABNORMAL LOW (ref 7.350–7.450)
pO2, Arterial: 91 mmHg (ref 83.0–108.0)

## 2019-12-27 LAB — COMPREHENSIVE METABOLIC PANEL
ALT: 61 U/L — ABNORMAL HIGH (ref 0–44)
AST: 111 U/L — ABNORMAL HIGH (ref 15–41)
Albumin: 2.9 g/dL — ABNORMAL LOW (ref 3.5–5.0)
Alkaline Phosphatase: 112 U/L (ref 38–126)
Anion gap: 14 (ref 5–15)
BUN: 24 mg/dL — ABNORMAL HIGH (ref 8–23)
CO2: 25 mmol/L (ref 22–32)
Calcium: 9.6 mg/dL (ref 8.9–10.3)
Chloride: 101 mmol/L (ref 98–111)
Creatinine, Ser: 1.25 mg/dL — ABNORMAL HIGH (ref 0.44–1.00)
GFR calc Af Amer: 44 mL/min — ABNORMAL LOW (ref >60)
GFR calc non Af Amer: 38 mL/min — ABNORMAL LOW (ref >60)
Glucose, Bld: 142 mg/dL — ABNORMAL HIGH (ref 70–99)
Potassium: 3.8 mmol/L (ref 3.5–5.1)
Sodium: 140 mmol/L (ref 135–145)
Total Bilirubin: 1.2 mg/dL (ref 0.3–1.2)
Total Protein: 6.2 g/dL — ABNORMAL LOW (ref 6.5–8.1)

## 2019-12-27 LAB — FERRITIN: Ferritin: 166 ng/mL (ref 11–307)

## 2019-12-27 LAB — URINALYSIS, ROUTINE W REFLEX MICROSCOPIC
Glucose, UA: NEGATIVE mg/dL
Ketones, ur: NEGATIVE mg/dL
Nitrite: NEGATIVE
Protein, ur: 100 mg/dL — AB
Specific Gravity, Urine: 1.015 (ref 1.005–1.030)
WBC, UA: >50 WBC/hpf — ABNORMAL HIGH (ref 0–5)
pH: 8 (ref 5.0–8.0)

## 2019-12-27 LAB — CBC WITH DIFFERENTIAL/PLATELET
Abs Immature Granulocytes: 0.12 10*3/uL — ABNORMAL HIGH (ref 0.00–0.07)
Basophils Absolute: 0.1 10*3/uL (ref 0.0–0.1)
Basophils Relative: 0 %
Eosinophils Absolute: 0 10*3/uL (ref 0.0–0.5)
Eosinophils Relative: 0 %
HCT: 38 % (ref 36.0–46.0)
Hemoglobin: 11.8 g/dL — ABNORMAL LOW (ref 12.0–15.0)
Immature Granulocytes: 1 %
Lymphocytes Relative: 2 %
Lymphs Abs: 0.4 10*3/uL — ABNORMAL LOW (ref 0.7–4.0)
MCH: 28.9 pg (ref 26.0–34.0)
MCHC: 31.1 g/dL (ref 30.0–36.0)
MCV: 93.1 fL (ref 80.0–100.0)
Monocytes Absolute: 0.5 10*3/uL (ref 0.1–1.0)
Monocytes Relative: 2 %
Neutro Abs: 20.7 10*3/uL — ABNORMAL HIGH (ref 1.7–7.7)
Neutrophils Relative %: 95 %
Platelets: 304 10*3/uL (ref 150–400)
RBC: 4.08 MIL/uL (ref 3.87–5.11)
RDW: 13.5 % (ref 11.5–15.5)
WBC: 21.7 10*3/uL — ABNORMAL HIGH (ref 4.0–10.5)
nRBC: 0 % (ref 0.0–0.2)

## 2019-12-27 LAB — TROPONIN I (HIGH SENSITIVITY)
Troponin I (High Sensitivity): 188 ng/L (ref <18)
Troponin I (High Sensitivity): 218 ng/L (ref <18)

## 2019-12-27 LAB — TRIGLYCERIDES: Triglycerides: 71 mg/dL (ref <150)

## 2019-12-27 LAB — C-REACTIVE PROTEIN: CRP: 5.7 mg/dL — ABNORMAL HIGH (ref <1.0)

## 2019-12-27 LAB — PROCALCITONIN: Procalcitonin: 43.59 ng/mL

## 2019-12-27 LAB — LACTATE DEHYDROGENASE: LDH: 262 U/L — ABNORMAL HIGH (ref 98–192)

## 2019-12-27 LAB — LACTIC ACID, PLASMA
Lactic Acid, Venous: 2.6 mmol/L (ref 0.5–1.9)
Lactic Acid, Venous: 2.6 mmol/L (ref 0.5–1.9)

## 2019-12-27 LAB — D-DIMER, QUANTITATIVE: D-Dimer, Quant: 6.42 ug/mL-FEU — ABNORMAL HIGH (ref 0.00–0.50)

## 2019-12-27 LAB — SARS CORONAVIRUS 2 BY RT PCR (HOSPITAL ORDER, PERFORMED IN ~~LOC~~ HOSPITAL LAB): SARS Coronavirus 2: NEGATIVE

## 2019-12-27 LAB — CBG MONITORING, ED: Glucose-Capillary: 130 mg/dL — ABNORMAL HIGH (ref 70–99)

## 2019-12-27 LAB — FIBRINOGEN: Fibrinogen: 588 mg/dL — ABNORMAL HIGH (ref 210–475)

## 2019-12-27 LAB — LIPASE, BLOOD: Lipase: 20 U/L (ref 11–51)

## 2019-12-27 MED ORDER — FLUMAZENIL 0.5 MG/5ML IV SOLN
INTRAVENOUS | Status: AC
  Filled 2019-12-27: qty 5

## 2019-12-27 MED ORDER — ENOXAPARIN SODIUM 40 MG/0.4ML ~~LOC~~ SOLN: 40.0000 mg | SUBCUTANEOUS | Status: DC

## 2019-12-27 MED ORDER — SODIUM CHLORIDE 0.9 % IV SOLN
2.0000 g | Freq: Once | INTRAVENOUS | Status: AC
  Administered 2019-12-27: 2 g via INTRAVENOUS
  Filled 2019-12-27: qty 2

## 2019-12-27 MED ORDER — IOHEXOL 300 MG/ML  SOLN
80.0000 mL | Freq: Once | INTRAMUSCULAR | Status: AC | PRN
  Administered 2019-12-27: 80 mL via INTRAVENOUS

## 2019-12-27 MED ORDER — METRONIDAZOLE IN NACL 5-0.79 MG/ML-% IV SOLN
500.0000 mg | Freq: Once | INTRAVENOUS | Status: AC
  Administered 2019-12-27: 500 mg via INTRAVENOUS
  Filled 2019-12-27: qty 100

## 2019-12-27 MED ORDER — SODIUM CHLORIDE 0.9 % IV BOLUS
500.0000 mL | Freq: Once | INTRAVENOUS | Status: AC
  Administered 2019-12-27: 500 mL via INTRAVENOUS

## 2019-12-27 MED ORDER — ACETAMINOPHEN 650 MG RE SUPP: 650.0000 mg | Freq: Four times a day (QID) | RECTAL | Status: DC | PRN

## 2019-12-27 MED ORDER — ALBUTEROL SULFATE (2.5 MG/3ML) 0.083% IN NEBU: 2.5000 mg | INHALATION_SOLUTION | RESPIRATORY_TRACT | Status: DC | PRN

## 2019-12-27 MED ORDER — NALOXONE HCL 0.4 MG/ML IJ SOLN
INTRAMUSCULAR | Status: AC
  Filled 2019-12-27: qty 1

## 2019-12-27 MED ORDER — PIPERACILLIN-TAZOBACTAM 3.375 G IVPB 30 MIN
3.3750 g | Freq: Once | INTRAVENOUS | Status: AC
  Administered 2019-12-27: 3.375 g via INTRAVENOUS
  Filled 2019-12-27: qty 50

## 2019-12-27 MED ORDER — LIDOCAINE HCL (PF) 1 % IJ SOLN
INTRAMUSCULAR | Status: AC | PRN
  Administered 2019-12-27: 10 mL

## 2019-12-27 MED ORDER — LACTATED RINGERS IV BOLUS
250.0000 mL | Freq: Once | INTRAVENOUS | Status: AC
  Administered 2019-12-27: 250 mL via INTRAVENOUS

## 2019-12-27 MED ORDER — ONDANSETRON HCL 4 MG/2ML IJ SOLN: 4.0000 mg | Freq: Four times a day (QID) | INTRAMUSCULAR | Status: DC | PRN

## 2019-12-27 MED ORDER — IOHEXOL 300 MG/ML  SOLN
50.0000 mL | Freq: Once | INTRAMUSCULAR | Status: AC | PRN
  Administered 2019-12-27: 10 mL

## 2019-12-27 MED ORDER — MIDAZOLAM HCL 2 MG/2ML IJ SOLN
INTRAMUSCULAR | Status: AC
  Filled 2019-12-27: qty 2

## 2019-12-27 MED ORDER — LACTATED RINGERS IV SOLN: INTRAVENOUS | Status: DC

## 2019-12-27 MED ORDER — HYDRALAZINE HCL 20 MG/ML IJ SOLN: 10.0000 mg | Freq: Four times a day (QID) | INTRAMUSCULAR | Status: DC | PRN

## 2019-12-27 MED ORDER — LACTATED RINGERS IV SOLN: INTRAVENOUS | Status: AC

## 2019-12-27 MED ORDER — LIDOCAINE HCL 1 % IJ SOLN
INTRAMUSCULAR | Status: AC
  Filled 2019-12-27: qty 20

## 2019-12-27 MED ORDER — FENTANYL CITRATE (PF) 100 MCG/2ML IJ SOLN
INTRAMUSCULAR | Status: AC | PRN
  Administered 2019-12-27: 25 ug via INTRAVENOUS

## 2019-12-27 MED ORDER — PIPERACILLIN-TAZOBACTAM 3.375 G IVPB
3.3750 g | Freq: Three times a day (TID) | INTRAVENOUS | Status: DC
  Administered 2019-12-28 – 2019-12-30 (×7): 3.375 g via INTRAVENOUS
  Filled 2019-12-27 (×8): qty 50

## 2019-12-27 MED ORDER — ACETAMINOPHEN 325 MG PO TABS
650.0000 mg | ORAL_TABLET | Freq: Four times a day (QID) | ORAL | Status: DC | PRN
  Administered 2020-01-01 – 2020-01-02 (×2): 650 mg via ORAL
  Filled 2019-12-27 (×2): qty 2

## 2019-12-27 MED ORDER — SODIUM CHLORIDE 0.9 % IV BOLUS
500.0000 mL | Freq: Once | INTRAVENOUS | Status: AC
  Administered 2019-12-27 (×2): 500 mL via INTRAVENOUS

## 2019-12-27 MED ORDER — ONDANSETRON HCL 4 MG PO TABS: 4.0000 mg | ORAL_TABLET | Freq: Four times a day (QID) | ORAL | Status: DC | PRN

## 2019-12-27 MED ORDER — VANCOMYCIN HCL IN DEXTROSE 1-5 GM/200ML-% IV SOLN
1000.0000 mg | Freq: Once | INTRAVENOUS | Status: AC
  Administered 2019-12-27: 1000 mg via INTRAVENOUS
  Filled 2019-12-27: qty 200

## 2019-12-27 MED ORDER — FENTANYL CITRATE (PF) 100 MCG/2ML IJ SOLN
INTRAMUSCULAR | Status: AC
  Filled 2019-12-27: qty 2

## 2019-12-27 MED ORDER — PANTOPRAZOLE SODIUM 40 MG IV SOLR
40.0000 mg | INTRAVENOUS | Status: DC
  Administered 2019-12-27 – 2019-12-29 (×3): 40 mg via INTRAVENOUS
  Filled 2019-12-27 (×3): qty 40

## 2019-12-27 MED ORDER — MIDAZOLAM HCL 2 MG/2ML IJ SOLN
INTRAMUSCULAR | Status: AC | PRN
  Administered 2019-12-27: 0.5 mg via INTRAVENOUS

## 2019-12-27 MED ORDER — SODIUM CHLORIDE 0.9 % IV BOLUS
1000.0000 mL | Freq: Once | INTRAVENOUS | Status: AC
  Administered 2019-12-27: 1000 mL via INTRAVENOUS

## 2019-12-27 MED ORDER — HEPARIN SODIUM (PORCINE) 5000 UNIT/ML IJ SOLN
5000.0000 [IU] | Freq: Three times a day (TID) | INTRAMUSCULAR | Status: DC
  Administered 2019-12-27 – 2019-12-31 (×11): 5000 [IU] via SUBCUTANEOUS
  Filled 2019-12-27 (×11): qty 1

## 2019-12-27 MED ORDER — ONDANSETRON HCL 4 MG/2ML IJ SOLN
4.0000 mg | Freq: Once | INTRAMUSCULAR | Status: AC
  Administered 2019-12-27: 4 mg via INTRAVENOUS
  Filled 2019-12-27: qty 2

## 2019-12-27 MED ORDER — ACETAMINOPHEN 500 MG PO TABS
1000.0000 mg | ORAL_TABLET | Freq: Once | ORAL | Status: AC
  Administered 2019-12-27: 1000 mg via ORAL
  Filled 2019-12-27: qty 2

## 2019-12-27 NOTE — ED Notes (Signed)
Annalisia Ingber, RN and Dr. Lennie Odor took phone verbalization consent from [name], family member, to sign consent on behalf.

## 2019-12-27 NOTE — ED Notes (Signed)
RN and Provider attempted x2 to call pt family to get consent for procedure. Pt is not mentally coherent to make her own decisions.

## 2019-12-27 NOTE — ED Provider Notes (Signed)
Medical screening examination/treatment/procedure(s) were conducted as a shared visit with non-physician practitioner(s) and myself.  I personally evaluated the patient during the encounter.  EKG Interpretation  Date/Time:  Sunday December 27 2019 13:18:47 EDT Ventricular Rate:  98 PR Interval:    QRS Duration: 128 QT Interval:  377 QTC Calculation: 482 R Axis:   -88 Text Interpretation: Sinus rhythm Atrial premature complex Nonspecific IVCD with LAD LVH with secondary repolarization abnormality no change from previous Confirmed by Arby Barrette [phone]) on 12/27/2019 1:28:41 PM  Patient sent from [facility] nursing facility.  Patient has had cough and has reported shortness of breath.  Patient is denying any pain.  She reports she just generally feels extremely weak and fatigued.  Documented Covid outbreak at [employer] at this time.  Patient is fatigued and deconditioned in appearance.  She is alert and answering questions appropriately.  Mild tachypnea.  Some scattered rhonchi.  Abdomen is diffusely tender.  No significant peripheral edema.  Skin warm and dry.  No focal motor deficits.   Patient is significantly ill, however mental status and respiratory status stable at this time.  Concern for sepsis.  Agree with initiating sepsis protocol and broad-spectrum antibiotics while awaiting diagnostic results.   Arby Barrette, MD 12/30/19 [phone]

## 2019-12-27 NOTE — ED Notes (Signed)
[name] niece [phone] would like an update on the pt

## 2019-12-27 NOTE — Procedures (Signed)
Pre Procedure Dx: Hydronephrosis Post Procedural Dx: Same  Successful US and fluoroscopic guided placement of a right sided PCN with end coiled and locked within the renal pelvis. PCN connected to gravity bag.  EBL: Trace  Complications: None immediate.  Jay Randie Bloodgood, MD Pager #: [phone]     

## 2019-12-27 NOTE — H&P (Signed)
History and Physical    [name] YTK:[id number] DOB: 11-Jun-1928 DOA: 12/27/2019  PCP: Donita Brooks, MD  Patient coming from: [facility]   Chief Complaint:  Chief Complaint  Patient presents with  . Shortness of Breath     HPI:   86 year old female with past medical history of coronary artery disease (S/P CABG 1997, revision 2010), paroxysmal atrial fibrillation, diastolic and systolic congestive heart failure (Echo 06/2015 revealing EF 55-60% with diastolic dysfunction), asthma, gastroesophageal reflux disease, hyperlipidemia and hypertension who presented from her skilled nursing facility with multiple complaints.  At the time of my evaluation of the patient, patient is minimally responsive and unable to provide history. Per emergency department staff as well as review of skilled nursing facility notes, patient's been complaining of multiple complaints in the past several days including shortness of breath, nausea, vomiting, diarrhea and chest pain. No other descriptors are available with any of these complaints.  Upon thorough evaluation in the emergency department patient was found to have multiple SIRS criteria along with evidence of multiorgan dysfunction all concerning for sepsis. Patient is exhibiting encephalopathy, hypoxia and elevated transaminases. Concerning the source, urinalysis is highly suggestive of urinary tract infection.  CT imaging of the abdomen and pelvis revealed mild right hydronephrosis secondary to presence of a stone just distal to the right UPJ. Additionally, cluster stones with staghorn component were identified in the left renal pelvis without hydronephrosis. Finally, circumferential bladder wall thickening was noted. These findings are all consistent with a complicated urinary tract infection secondary to retained stone. Emergency department provider discussed the case with Dr. Liliane Shi with urology who recommended that the best course of  action for this particular patient is an interventional radiology right nephrostomy placement for now as patient is being treated for her severe sepsis. Emergency department divider also discussed the case with Dr. Hart Rochester with interventional radiology who is planning to perform the nephrostomy placement this evening. The hospitalist group was then called to assess the patient admission the hospital.   Review of Systems: Unable to obtain due to severe lethargy.   Past Medical History:  Diagnosis Date  . Allergy   . Asthma   . CAD (coronary artery disease)    s/p CABG x 5 in 1997 & CABG x 3 in 2010; Last cath in April of 2010 showed grafts to be patent. EF is 40 to 45%  . CHF (congestive heart failure) (HCC)    EF is 40 to 45%, 06/2015 EF 55-60%  . Depression    following bypass surgery  . Diverticulosis   . Endometriosis   . GERD (gastroesophageal reflux disease)   . Hepatitis A   . Hiatal hernia   . History of cerebrovascular disease   . History of vertebral fracture    at T8  . Hyperlipemia   . Hypertension   . LBP (low back pain)   . Mixed incontinence urge and stress 04/22/2013  . Nocturia 04/22/2013  . Pleural effusion    post surgical  . PVD (peripheral vascular disease) (HCC)    S/P right CEA  . Sleep apnea   . Stroke [facility]) 2010    Past Surgical History:  Procedure Laterality Date  . APPENDECTOMY    . CARDIAC CATHETERIZATION  April 2010   Grafts were patent. EF is 40 to 45%  . CAROTID ENDARTERECTOMY     right  . CORONARY ARTERY BYPASS GRAFT  [id number]   Original surgery in 1997 x 5; Redo CABG  x 3 in 2010 including free RIMA to distal LAD, SVG to OM and SVG to left posterolateral  . INTRAMEDULLARY (IM) NAIL INTERTROCHANTERIC Right 11/19/2018   Procedure: INTRAMEDULLARY (IM) NAIL INTERTROCHANTRIC;  Surgeon: Yolonda Kida, MD;  Location: [facility] OR;  Service: Orthopedics;  Laterality: Right;  . KNEE SURGERY     left  . LEFT HEART CATHETERIZATION WITH  CORONARY/GRAFT ANGIOGRAM N/A 03/12/2013   Procedure: LEFT HEART CATHETERIZATION WITH Isabel Caprice;  Surgeon: Peter M Swaziland, MD;  Location: [facility] CATH LAB;  Service: Cardiovascular;  Laterality: N/A;  . PARTIAL COLECTOMY     due to colon abcess     reports that she has never smoked. She has never used smokeless tobacco. She reports that she does not drink alcohol and does not use drugs.  Allergies  Allergen Reactions  . Metoprolol Other (See Comments)    allopoecia  . Phenergan [Promethazine Hcl] Other (See Comments)    Facial numbness  . Prochlorperazine Edisylate Anaphylaxis  . Zetia [Ezetimibe] Nausea Only and Other (See Comments)    Abdominal pain  . Compazine [Prochlorperazine]     Facial  Paralysis       . Sulfur Other (See Comments)    paralyzes  face   . Niacin Rash  . Sulfonamide Derivatives Nausea And Vomiting    Family History  Problem Relation Age of Onset  . Heart attack Mother   . Diabetes Mother   . Heart disease Mother   . Rheum arthritis Mother   . Heart attack Father   . Allergies Father   . Diabetes Sister   . Stroke Sister   . Heart disease Sister   . Dementia Brother   . Healthy Sister   . Other Brother        LUNG ISSUES  . Other Brother        LUNG ISSUES  . Other Brother        LUNG ISSUES  . Diabetes Maternal Grandmother   . Diabetes Paternal Grandmother   . Colon cancer Neg Hx      Prior to Admission medications   Medication Sig Start Date End Date Taking? Authorizing Provider  acetaminophen (TYLENOL) 325 MG tablet Take 2 tablets (650 mg total) by mouth every 6 (six) hours as needed for mild pain, fever or headache (or Fever >/= 101). 09/11/18   Shon Hale, MD  aspirin EC 81 MG tablet Take 1 tablet (81 mg total) by mouth daily with breakfast. 09/11/18   Mariea Clonts, Courage, MD  docusate sodium (COLACE) 100 MG capsule Take 1 capsule (100 mg total) by mouth 2 (two) times daily. 11/21/18   Swayze, Ava, DO  esomeprazole  (NEXIUM) 20 MG capsule Take 20 mg by mouth daily at 12 noon.     [provider]  HYDROcodone-acetaminophen (NORCO/VICODIN) 5-325 MG tablet Take 1-2 tablets by mouth every 4 (four) hours as needed. 04/09/19   Lorre Nick, MD  isosorbide mononitrate (IMDUR) 60 MG 24 hr tablet TAKE 1 TABLET BY MOUTH DAILY. PLEASE MAKE YEARLY APPT WITH DR. Elease Hashimoto BEFORE ANYMORE REFILLS Patient taking differently: Take 60 mg by mouth daily.  12/01/18   Nahser, Deloris Ping, MD  lidocaine (LIDODERM) 5 % Place 1 patch onto the skin 2 (two) times daily. Remove & Discard patch within 12 hours or as directed by MD    [provider]  methocarbamol (ROBAXIN) 500 MG tablet Take 500 mg by mouth every 8 (eight) hours as needed for muscle spasms.  [provider]  nebivolol (BYSTOLIC) 10 MG tablet Take 1 tablet (10 mg total) by mouth daily. Hold for SBP < 110 mmhg and HR < 60 BPM 09/12/18   Emokpae, Courage, MD  nitroGLYCERIN (NITROSTAT) 0.4 MG SL tablet Place 1 tablet (0.4 mg total) under the tongue every 5 (five) minutes as needed for chest pain. 10/26/17   Devoria Albe, MD  oxyCODONE (OXY IR/ROXICODONE) 5 MG immediate release tablet Take 10 mg by mouth every 4 (four) hours as needed for severe pain.    [provider]  potassium chloride (MICRO-K) 10 MEQ CR capsule Take 1 capsule (10 mEq total) by mouth daily. 09/11/18   Shon Hale, MD  rosuvastatin (CRESTOR) 20 MG tablet Take 1 tablet (20 mg total) by mouth daily. Pt needs to keep appt in Oct for further refills 02/09/19   Nahser, Deloris Ping, MD    Physical Exam: Vitals:   12/27/19 1846 12/27/19 1900 12/27/19 1915 12/27/19 2014  BP: (!) 138/60  (!) 141/54 (!) 138/64  Pulse: 105  95 (!) 112  Resp: (!) 26  (!) 26 (!) 28  Temp:      TempSrc:      SpO2: (!) 88% 93% 93% 94%  Weight:      Height:        Constitutional: Lethargic, minimally arousable, disoriented, in mild respiratory distress.   Skin: no rashes, no lesions, notable poor skin  turgor. Eyes: Pupils are equally reactive to light.  No evidence of scleral icterus or conjunctival pallor.  ENMT: Extremely dry mucous membranes noted. Posterior pharynx clear of any exudate or lesions.   Neck: normal, supple, no masses, no thyromegaly.  No evidence of jugular venous distension.   Respiratory: Markedly diminished breath sounds at the bases with scattered rhonchi bilaterally. Intermittent mild expiratory wheezing noted. Patient is exhibiting increased respiratory effort with some accessory muscle use. Cardiovascular: Tachycardic rate with regular rhythm. no murmurs / rubs / gallops. No extremity edema. 2+ pedal pulses. No carotid bruits.  Chest:   Nontender without crepitus or deformity.   Back:   Diffuse lumbar and thoracic tenderness without crepitus or deformity. Abdomen: Notable generalized abdominal tenderness. Abdomen is soft however. No evidence of intra-abdominal masses.  Positive bowel sounds noted in all quadrants.   Musculoskeletal: No joint deformity upper and lower extremities. Good ROM, no contractures. Normal muscle tone.  Neurologic: Patient is minimally responsive and not following commands. Patient is responsive to painful stimuli. Patient does appear to be moving all 4 extremities spontaneously. Psychiatric: Unable to assess due to severe lethargy encephalopathy, patient family does not seem to possess insight as to her current situation..     Labs on Admission: I have personally reviewed following labs and imaging studies -   CBC: Recent Labs  Lab 12/27/19 1301 12/27/19 2009  WBC 21.7*  --   NEUTROABS 20.7*  --   HGB 11.8* 10.9*  HCT 38.0 32.0*  MCV 93.1  --   PLT 304  --    Basic Metabolic Panel: Recent Labs  Lab 12/27/19 1301 12/27/19 2009  NA 140 139  K 3.8 4.1  CL 101  --   CO2 25  --   GLUCOSE 142*  --   BUN 24*  --   CREATININE 1.25*  --   CALCIUM 9.6  --    GFR: Estimated Creatinine Clearance: 23.9 mL/min (A) (by C-G formula based  on SCr of 1.25 mg/dL (H)). Liver Function Tests: Recent Labs  Lab 12/27/19 1301  AST 111*  ALT 61*  ALKPHOS 112  BILITOT 1.2  PROT 6.2*  ALBUMIN 2.9*   Recent Labs  Lab 12/27/19 1301  LIPASE 20   No results for input(s): AMMONIA in the last 168 hours. Coagulation Profile: No results for input(s): INR, PROTIME in the last 168 hours. Cardiac Enzymes: No results for input(s): CKTOTAL, CKMB, CKMBINDEX, TROPONINI in the last 168 hours. BNP (last 3 results) No results for input(s): PROBNP in the last 8760 hours. HbA1C: No results for input(s): HGBA1C in the last 72 hours. CBG: Recent Labs  Lab 12/27/19 1255  GLUCAP 130*   Lipid Profile: Recent Labs    12/27/19 1301  TRIG 71   Thyroid Function Tests: No results for input(s): TSH, T4TOTAL, FREET4, T3FREE, THYROIDAB in the last 72 hours. Anemia Panel: Recent Labs    12/27/19 1301  FERRITIN 166   Urine analysis:    Component Value Date/Time   COLORURINE AMBER (A) 12/27/2019 1720   APPEARANCEUR CLOUDY (A) 12/27/2019 1720   LABSPEC 1.015 12/27/2019 1720   PHURINE 8.0 12/27/2019 1720   GLUCOSEU NEGATIVE 12/27/2019 1720   HGBUR MODERATE (A) 12/27/2019 1720   BILIRUBINUR SMALL (A) 12/27/2019 1720   KETONESUR NEGATIVE 12/27/2019 1720   PROTEINUR 100 (A) 12/27/2019 1720   UROBILINOGEN 0.2 01/15/2014 2032   NITRITE NEGATIVE 12/27/2019 1720   LEUKOCYTESUR MODERATE (A) 12/27/2019 1720    Radiological Exams on Admission - Personally Reviewed: DG Chest 1 View  Result Date: 12/27/2019 CLINICAL DATA:  Shortness of breath and respiratory failure EXAM: CHEST  1 VIEW COMPARISON:  April 09, 2019 FINDINGS: There is mild cardiomegaly. Aortic knob calcifications are seen. Overlying median sternotomy wires are present. Patchy airspace opacity seen within the left lung base and right lung base. There is diffusely increased interstitial markings throughout both lungs. No acute osseous abnormality. Cement fixation seen within the mid  thoracic spine. IMPRESSION: Patchy interstitial airspace opacities throughout both lungs which could be due to asymmetric edema and/or infectious etiology Electronically Signed   By: Jonna Clark M.D.   On: 12/27/2019 20:45   CT ABDOMEN PELVIS W CONTRAST  Result Date: 12/27/2019 CLINICAL DATA:  Infectious gastroenteritis or colitis. EXAM: CT ABDOMEN AND PELVIS WITH CONTRAST TECHNIQUE: Multidetector CT imaging of the abdomen and pelvis was performed using the standard protocol following bolus administration of intravenous contrast. CONTRAST:  55mL OMNIPAQUE IOHEXOL 300 MG/ML  SOLN COMPARISON:  09/10/2018 FINDINGS: Lower chest: Heart is enlarged. Left base atelectasis or infiltrate. Hepatobiliary: Small area of low attenuation in the anterior liver, adjacent to the falciform ligament, is in a characteristic location for focal fatty deposition in similar to prior. Probable tiny layering gallstones. No intrahepatic or extrahepatic biliary dilation. Pancreas: No focal mass lesion. No dilatation of the main duct. No intraparenchymal cyst. No peripancreatic edema. Spleen: No splenomegaly. No focal mass lesion. Adrenals/Urinary Tract: No adrenal nodule or mass. 2 x 4 mm nonobstructing stone identified posterior interpolar right kidney. A second tiny 2 mm posterior interpolar right renal stone evident. There is mild right hydronephrosis secondary to the presence of a linear 3 x 6 x 12 mm stone at or just distal to the right UPJ. Urothelial enhancement around the stone is likely secondary to irritation. There is some fullness in the right ureter below this stone but no additional right ureteral stone evident. There is a phlebolith in the right pelvis seen to be below the ureter on coronal image 47 of series 7. Clustered stones likely with staghorn component identified in  the left renal pelvis extending into upper and lower pole collecting systems. No substantial hydronephrosis. No left hydroureter. There is irregular  circumferential bladder wall thickening with possible left superolateral bladder diverticulum on coronal image 40 of series 7. Stomach/Bowel: Small hiatal hernia. Duodenum is normally positioned as is the ligament of Treitz. No small bowel wall thickening. No small bowel dilatation. The terminal ileum is normal. No gross colonic mass. No colonic wall thickening. Diverticular changes are noted in the left colon without evidence of diverticulitis. Mild wall thickening in the mid sigmoid colon may be related to muscular hypertrophy from the diverticulosis. No features of diverticulitis. Vascular/Lymphatic: There is abdominal aortic atherosclerosis without aneurysm. There is no gastrohepatic or hepatoduodenal ligament lymphadenopathy. No retroperitoneal or mesenteric lymphadenopathy. No pelvic sidewall lymphadenopathy. Reproductive: Fluid and gas identified in the right vaginal fornix (image 72/series 3). No obvious colovaginal fistula. Insert no adnexal mass Other: No intraperitoneal free fluid. Musculoskeletal: Bones are diffusely demineralized. Status post right hip replacement.L1 superior endplate compression fracture noted. IMPRESSION: 1. Mild right hydronephrosis secondary to the presence of a linear 3 x 6 x 12 mm stone at or just distal to the right UPJ. Urothelial enhancement around the stone is likely secondary to irritation. 2. Clustered stones likely with staghorn component identified in the left renal pelvis extending into upper and lower pole collecting systems. No substantial hydronephrosis. 3. Irregular circumferential bladder wall thickening with possible left superolateral bladder diverticulum. Bladder infection not excluded. 4. Fluid and gas identified in the right vaginal fornix. No evidence for colovaginal fistula. Indeterminate finding. Patient did have some gas in the vaginal vault on previous CT from 09/10/2018. 5. Small hiatal hernia. 6. Left base atelectasis or infiltrate. 7. L1 superior  endplate compression fracture. 8. Aortic Atherosclerosis (ICD10-I70.0). Electronically Signed   By: Kennith Center M.D.   On: 12/27/2019 17:13   DG Abd Acute W/Chest  Result Date: 12/27/2019 CLINICAL DATA:  Shortness of breath, COVID positive, nausea and vomiting. EXAM: DG ABDOMEN ACUTE W/ 1V CHEST COMPARISON:  Chest x-ray dated 04/09/2019. Abdominal x-ray dated 12/30/2013. FINDINGS: Heart size and mediastinal contours are stable. Lungs are clear. Lungs are hyperexpanded. No pleural effusion or pneumothorax is seen. Old healed rib fractures on the LEFT. Overall bowel gas pattern is nonobstructive. No evidence of soft tissue mass or abnormal fluid collection. No evidence of free intraperitoneal air. Extensive aortic atherosclerosis. Degenerative spondylosis of the slightly scoliotic thoracolumbar spine, moderate in degree. No acute appearing osseous abnormality. IMPRESSION: 1. No active cardiopulmonary disease. No evidence of pneumonia or pulmonary edema. 2. COPD. 3. No acute findings within the abdomen or pelvis. Nonobstructive bowel gas pattern. Electronically Signed   By: Bary Richard M.D.   On: 12/27/2019 13:51    EKG: Personally reviewed.  Rhythm is sinus rhythm with heart rate of 98BPM.  Notable evidence of left ventricular hypertrophy with interventricular conduction delay.  No dynamic ST segment changes noted.  Assessment/Plan Principal Problem:   Severe sepsis with acute organ dysfunction due to Gram negative bacteria [place])   Patient exhibiting multiple SIRS criteria in the setting of evidence of multiorgan dysfunction secondary to sepsis due to complicated urinary tract infection with likely infected right UPJ stone and evidence of cystitis as supported by abnormal urinalysis and CT imaging of the abdomen and pelvis.  Patient is suffering from secondary metabolic encephalopathy and lactic acidosis with elevated troponin  Placing patient on broad-spectrum intravenous antibiotic therapy with  intravenous Zosyn  Hydrating patient with intravenous isotonic fluids  Case  was discussed with Dr. Liliane Shi with urology by the emergency department staff.  Due to the patient's tenuous clinical condition he recommended interventional radiology intervention with right nephrostomy placement.  Case was additionally discussed with Dr. Elmon Kirschner by the emergency department staff who has graciously agreed to proceed with emergent placement of right nephrostomy tube.  I personally discussed patient's overall care with the patient's niece Mrs. Sandre Kitty gave [place] consent to proceed with the procedure.  Admitting patient afterwards to stepdown unit for close clinical monitoring  Active Problems:  Obstruction of right uteropelvic junction due to stone   Please see assessment and plan above    Acute metabolic encephalopathy   Acute metabolic encephalopathy felt to be secondary to severe sepsis  Monitoring for clinical improvement with treatment of underlying infection    Lactic acidosis   Lactic acidosis felt to be secondary to underlying infection and volume depletion  Hydrate patient with intravenous isotonic fluids in addition to providing patient with broad-spectrum antibiotic therapy  Performing serial lactic acid levels to ensure downtrending and resolution    Essential hypertension   Considering notable hypotension during a portion of the emergency department course, holding scheduled home antihypertensives at this time.    PAF (paroxysmal atrial fibrillation) (HCC)   Notable history of paroxysmal atrial fibrillation  Patient is typically on Bystolic in the outpatient setting but is currently unable to tolerate oral intake.  Would typically transition patient to low-dose scheduled intravenous metoprolol however patient has an allergy to metoprolol.  Other intravenous beta-blockers such as labetalol have much more potent effect on blood pressure and therefore will abstain from  scheduling this medication at this time.  Will restart home oral Bystolic as soon as patient is able to tolerate oral intake.    Elevated troponin level not due myocardial infarction   Notably markedly elevated troponin of 188 followed by 218  No evidence of EKG changes  Patient not awake and alert enough to report any chest pain  Troponin is unlikely to be secondary to plaque rupture at this time and more likely be secondary to supply demand mismatch from underlying infection.  Treating underlying infection, cycling cardiac enzymes  Monitoring patient on telemetry    GERD without esophagitis   Transitioning patient to intravenous PPI at this time    Mixed hyperlipidemia  Holding oral statin therapy at this time until patient is able to consistently tolerate oral intake   Code Status:  DNR Family Communication: Plan of care has been discussed with Ms. Sandre Kitty patient's niece and surrogate decision-maker.  She is confirmed patient's DNR and DNI CODE STATUS and has given consent to proceed with right nephrostomy tube placement.  Status is: Inpatient  Remains inpatient appropriate because:Altered mental status, Ongoing diagnostic testing needed not appropriate for outpatient work up, IV treatments appropriate due to intensity of illness or inability to take PO and Inpatient level of care appropriate due to severity of illness   Dispo: The patient is from: SNF              Anticipated d/c is to: SNF              Anticipated d/c date is: > 3 days              Patient currently is not medically stable to d/c.        Marinda Elk MD [facility] Pager [phone]  If 7PM-7AM, please contact night-coverage [email or web address] Use universal  password for that web  site. If you do not have the password, please call the hospital operator.  12/27/2019, 8:58 PM

## 2019-12-27 NOTE — ED Provider Notes (Addendum)
Came from [place], COVID outbreak cough, mild CP, n/v/d, abdominal pain Fever, tachypnea, HR > 90, hypoxia now on 2 L [place] WBC 23 Lactic 2.6 Soft SBO 100-114, MAP > 65.   Initiated on broad abx, small IVF, tylenol, h/o HF and MAP > 65 Diffuse abd tenderness on exam Unknown vax status   Admit for severe sepsis unknown source.  Pending COVID, UA, CTAP Assigned  Physical Exam  BP (!) 134/56   Pulse 103   Temp 98.1 F (36.7 C) (Rectal)   Resp (!) 26   Ht 5\' 4"  (1.626 m)   Wt 48.6 kg   SpO2 92%   BMI 18.40 kg/m   ED Course/Procedures   Clinical Course as of Dec 26 2005  Sun Dec 27, 2019  1511 Unchanged   ED EKG 12-Lead [CG]  1555 IMPRESSION: 1. No active cardiopulmonary disease. No evidence of pneumonia or pulmonary edema. 2. COPD. 3. No acute findings within the abdomen or pelvis. Nonobstructive  bowel gas pattern.  DG Abd Acute W/Chest [CG]  1555 188 > 218  Troponin I (High Sensitivity)(!!): 218 [CG]  1555 Pulse Rate: 96 [CG]  1555 BP(!): 116/58 [CG]  1555 BP(!): 104/53 [CG]  1555 Resp(!): 27 [CG]  1556 Fever 102    [CG]  1556 Lactic Acid, Venous(!!): 2.6 [CG]  1556 WBC(!): 21.7 [CG]  1556 Creatinine(!): 1.25 [CG]  1556 AST(!): 111 [CG]  1556 ALT(!): 61 [CG]  1556 GFR, Est Non African American(!): 38 [CG]  1724 CT ABDOMEN PELVIS W CONTRAST [CG]  1724 IMPRESSION: 1. Mild right hydronephrosis secondary to the presence of a linear 3 x 6 x 12 mm stone at or just distal to the right UPJ. Urothelial enhancement around the stone is likely secondary to irritation. 2. Clustered stones likely with staghorn component identified in the left renal pelvis extending into upper and lower pole collecting systems. No substantial hydronephrosis. 3. Irregular circumferential bladder wall thickening with possible left superolateral bladder diverticulum. Bladder infection not excluded. 4. Fluid and gas identified in the right vaginal fornix. No evidence for colovaginal fistula.  Indeterminate finding. Patient did have some gas in the vaginal vault on previous CT from 09/10/2018. 5. Small hiatal hernia. 6. Left base atelectasis or infiltrate. 7. L1 superior endplate compression fracture. 8. Aortic Atherosclerosis (ICD10-I70.0).  CT ABDOMEN PELVIS W CONTRAST [CG]  1753 Last SBP 92.  Discussed with EDP Nanavati, will give additional 1 L IVF. Last echo EF 55-60%. CXR without edema here.  Rre-evaluated patient.  SBP 105.  Ill appearing.  But arousable, tells me her breathing feels "stuffy".  On [place] SpO2 >92%    [CG]  1754 04/17/2020Glori Luis): MODERATE [CG]  1754 RBC / HPF: 21-50 [CG]  1754 WBC, UA(!): >50 [CG]  1754 Bacteria, UA(!): RARE [CG]  1858 PERC nephrostomy tube, will need IR for nephrostomy ASAP. Not stable for urological general anesthesia.    [CG]    Clinical Course User Index [CG] Marland Kitchen, PA-C    .Critical Care Performed by: Liberty Handy, PA-C Authorized by: Liberty Handy, PA-C   Critical care provider statement:    Critical care time (minutes):  45   Critical care was necessary to treat or prevent imminent or life-threatening deterioration of the following conditions:  Sepsis and respiratory failure   Critical care was time spent personally by me on the following activities:  Discussions with consultants, evaluation of patient's response to treatment, examination of patient, ordering and performing treatments and interventions, ordering and review  of laboratory studies, ordering and review of radiographic studies, pulse oximetry, re-evaluation of patient's condition, obtaining history from patient or surrogate, review of old charts and development of treatment plan with patient or surrogate   I assumed direction of critical care for this patient from another provider in my specialty: no      MDM   Patient with severe sepsis.  Urinalysis suspicious for infection.  CT A/P shows large right UPJ stone with irregular bladder wall  thickening.  Of note, some fluid and gas in the right vaginal fornix without evidence of colovaginal fistula.  Patient did have gas in vaginal vault on previous CT scan.  Broad-spectrum IV antibiotics ordered by previous team.  She has received close to 2 L IV fluid.  Initial hypotension has responded to IVF, last BP 134/56.  Labs show lactic acidosis 2.6.  Troponin 118 trending up to 218.  Question demand ischemia.  LFTs and creatinine also elevated.  Blood culture and urine culture sent.  Pending urology consult.  Suspect etiology of infection is UPJ stone.  Will admit to hospitalist service.  1830: Patient reevaluated, appears ill but otherwise stable vital signs.  Still arousable to voice, can follow commands.  Denies any pain.  States her breathing feels "stuffy".  SPO2 greater than 92% on 2 L [place].  2008: Patient discussed with urology Dr. Winters deems patient likely unstable for general anesthesia, recommends IR consult for PERC nephrostomy.  I spoke with Dr. Watts with IR, plan to take patient to IR suite soon as possible.  IR requesting emergent consent.  Patient does not appear to be in state of mind to provide consent.  Is able to tell me her name but unsure the extent that she understands.  I have attempted to call emergency contact, niece, x3 and unable to discuss patient care.  ED RN to fill out consent here.   Sherrilynn Gudgel J, PA-C 12/27/19 1833    Mak Bonny J, PA-C 12/27/19 2009    Nanavati, Ankit, MD 12/27/19 2318  

## 2019-12-27 NOTE — ED Triage Notes (Signed)
PT from [facility] via EMS . EMS reported an out break of COVID -19. Pt had a rapid COVID test that was NEG last week. Pt temp 101 , HR 100,

## 2019-12-27 NOTE — ED Provider Notes (Signed)
[facility] EMERGENCY DEPARTMENT Provider Note   CSN: [id number] Arrival date & time: 12/27/19  1236     History Chief Complaint  Patient presents with   Shortness of Breath    [name] is a 86 y.o. female.  HPI   86 year old female with a history of CAD, CHF, depression, diverticulosis, GERD, headache, hyperlipidemia, hypertension, pleural effusion, PVD, CVA, who presents the emergency department today for evaluation of shortness of breath.  Patient here from [place] has via EMS.  Reportedly this SNF has an outbreak of Covid at this time.  Patient reports she has had a cough and feels short of breath.  She also reports nausea vomiting and diarrhea.  She has some mild chest pain as well.  Past Medical History:  Diagnosis Date   Allergy    Asthma    CAD (coronary artery disease)    s/p CABG x 5 in 1997 & CABG x 3 in 2010; Last cath in April of 2010 showed grafts to be patent. EF is 40 to 45%   CHF (congestive heart failure) (HCC)    EF is 40 to 45%, 06/2015 EF 55-60%   Depression    following bypass surgery   Diverticulosis    Endometriosis    GERD (gastroesophageal reflux disease)    Hepatitis A    Hiatal hernia    History of cerebrovascular disease    History of vertebral fracture    at T8   Hyperlipemia    Hypertension    LBP (low back pain)    Mixed incontinence urge and stress 04/22/2013   Nocturia 04/22/2013   Pleural effusion    post surgical   PVD (peripheral vascular disease) (HCC)    S/P right CEA   Sleep apnea    Stroke [facility]) 2010    Patient Active Problem List   Diagnosis Date Noted   Hip fracture (HCC) 11/18/2018   Closed displaced intertrochanteric fracture of right femur (HCC)    Fall at home, initial encounter 09/11/2018   Generalized weakness 09/11/2018   UTI (urinary tract infection) 09/10/2018   Tachycardia    PAF (paroxysmal atrial fibrillation) (HCC)    Malnutrition of moderate degree  09/06/2017   Multiple rib fractures 09/04/2017   Dyspnea 01/21/2014   Chronic diastolic CHF (congestive heart failure) (HCC) 12/15/2013   Cerebral vascular disease 10/13/2013   Mixed incontinence urge and stress 04/22/2013   Nocturia 04/22/2013   Angina decubitus (HCC) 03/12/2013   GERD (gastroesophageal reflux disease) 08/16/2011   Other general symptoms  08/16/2011   Fatigue 09/04/2010   HEPATITIS, ACUTE 07/12/2009   AMI 07/12/2009   ESOPHAGEAL REFLUX 07/12/2009   DIVERTICULOSIS, COLON 07/12/2009   CHEST PAIN 07/12/2009   SLEEP APNEA 10/23/2007   Essential hypertension 09/18/2007   CAD (coronary artery disease) 09/18/2007   CHEST PAIN, ATYPICAL 09/18/2007    Past Surgical History:  Procedure Laterality Date   APPENDECTOMY     CARDIAC CATHETERIZATION  April 2010   Grafts were patent. EF is 40 to 45%   CAROTID ENDARTERECTOMY     right   CORONARY ARTERY BYPASS GRAFT  4098,1191   Original surgery in 1997 x 5; Redo CABG x 3 in 2010 including free RIMA to distal LAD, SVG to OM and SVG to left posterolateral   INTRAMEDULLARY (IM) NAIL INTERTROCHANTERIC Right 11/19/2018   Procedure: INTRAMEDULLARY (IM) NAIL INTERTROCHANTRIC;  Surgeon: Yolonda Kida, MD;  [facility];  Service: Orthopedics;  Laterality: Right;   KNEE  SURGERY     left   LEFT HEART CATHETERIZATION WITH CORONARY/GRAFT ANGIOGRAM N/A 03/12/2013   Procedure: LEFT HEART CATHETERIZATION WITH Isabel Caprice;  Surgeon: Peter M Swaziland, MD;  Location: [facility] CATH LAB;  Service: Cardiovascular;  Laterality: N/A;   PARTIAL COLECTOMY     due to colon abcess     OB History   No obstetric history on file.     Family History  Problem Relation Age of Onset   Heart attack Mother    Diabetes Mother    Heart disease Mother    Rheum arthritis Mother    Heart attack Father    Allergies Father    Diabetes Sister    Stroke Sister    Heart disease Sister    Dementia Brother     Healthy Sister    Other Brother        LUNG ISSUES   Other Brother        LUNG ISSUES   Other Brother        LUNG ISSUES   Diabetes Maternal Grandmother    Diabetes Paternal Grandmother    Colon cancer Neg Hx     Social History   Tobacco Use   Smoking status: Never Smoker   Smokeless tobacco: Never Used  Building services engineer Use: Never used  Substance Use Topics   Alcohol use: No    Alcohol/week: 0.0 standard drinks   Drug use: No    Home Medications Prior to Admission medications   Medication Sig Start Date End Date Taking? Authorizing Provider  acetaminophen (TYLENOL) 325 MG tablet Take 2 tablets (650 mg total) by mouth every 6 (six) hours as needed for mild pain, fever or headache (or Fever >/= 101). 09/11/18   Shon Hale, MD  aspirin EC 81 MG tablet Take 1 tablet (81 mg total) by mouth daily with breakfast. 09/11/18   Mariea Clonts, Courage, MD  docusate sodium (COLACE) 100 MG capsule Take 1 capsule (100 mg total) by mouth 2 (two) times daily. 11/21/18   Swayze, Ava, DO  esomeprazole (NEXIUM) 20 MG capsule Take 20 mg by mouth daily at 12 noon.     [provider]  HYDROcodone-acetaminophen (NORCO/VICODIN) 5-325 MG tablet Take 1-2 tablets by mouth every 4 (four) hours as needed. 04/09/19   Lorre Nick, MD  isosorbide mononitrate (IMDUR) 60 MG 24 hr tablet TAKE 1 TABLET BY MOUTH DAILY. PLEASE MAKE YEARLY APPT WITH DR. Elease Hashimoto BEFORE ANYMORE REFILLS Patient taking differently: Take 60 mg by mouth daily.  12/01/18   Nahser, Deloris Ping, MD  lidocaine (LIDODERM) 5 % Place 1 patch onto the skin 2 (two) times daily. Remove & Discard patch within 12 hours or as directed by MD    [provider]  methocarbamol (ROBAXIN) 500 MG tablet Take 500 mg by mouth every 8 (eight) hours as needed for muscle spasms.    [provider]  nebivolol (BYSTOLIC) 10 MG tablet Take 1 tablet (10 mg total) by mouth daily. Hold for SBP < 110 mmhg and HR < 60 BPM 09/12/18    Emokpae, Courage, MD  nitroGLYCERIN (NITROSTAT) 0.4 MG SL tablet Place 1 tablet (0.4 mg total) under the tongue every 5 (five) minutes as needed for chest pain. 10/26/17   Devoria Albe, MD  oxyCODONE (OXY IR/ROXICODONE) 5 MG immediate release tablet Take 10 mg by mouth every 4 (four) hours as needed for severe pain.    [provider]  potassium chloride (MICRO-K) 10 MEQ CR capsule  Take 1 capsule (10 mEq total) by mouth daily. 09/11/18   Shon Hale, MD  rosuvastatin (CRESTOR) 20 MG tablet Take 1 tablet (20 mg total) by mouth daily. Pt needs to keep appt in Oct for further refills 02/09/19   Nahser, Deloris Ping, MD    Allergies    Metoprolol, Phenergan [promethazine hcl], Prochlorperazine edisylate, Zetia [ezetimibe], Compazine [prochlorperazine], Sulfur, Niacin, and Sulfonamide derivatives  Review of Systems   Review of Systems  Constitutional: Positive for fever.  HENT: Negative for ear pain and sore throat.   Eyes: Negative for visual disturbance.  Respiratory: Positive for cough and shortness of breath.   Cardiovascular: Positive for chest pain. Negative for palpitations.  Gastrointestinal: Positive for abdominal pain, nausea and vomiting.  Genitourinary: Negative for dysuria, flank pain and hematuria.  Musculoskeletal: Negative for back pain.  Skin: Negative for rash.  Neurological: Negative for seizures and syncope.  All other systems reviewed and are negative.   Physical Exam Updated Vital Signs BP (!) 103/52    Pulse 71    Temp (!) 102.1 F (38.9 C) (Rectal)    Resp (!) 34    Ht 5\' 4"  (1.626 m)    Wt 48.6 kg    SpO2 93%    BMI 18.40 kg/m   Physical Exam Vitals and nursing note reviewed.  Constitutional:      General: She is not in acute distress.    Appearance: She is well-developed. She is ill-appearing.     Comments: Frail appearing woman  HENT:     Head: Normocephalic and atraumatic.  Eyes:     Conjunctiva/sclera: Conjunctivae normal.  Cardiovascular:      Rate and Rhythm: Normal rate and regular rhythm.     Heart sounds: No murmur heard.   Pulmonary:     Effort: Tachypnea present. No respiratory distress.     Breath sounds: Rhonchi and rales present.  Abdominal:     Palpations: Abdomen is soft.     Tenderness: There is guarding.     Comments: Mild diffuse TTP with normoactive BS  Musculoskeletal:     Cervical back: Neck supple.     Right lower leg: No tenderness. No edema.     Left lower leg: No tenderness. No edema.  Skin:    General: Skin is warm and dry.  Neurological:     Mental Status: She is alert.     Comments: Moves all extremities, no facial droop, answering questions appropriately     ED Results / Procedures / Treatments   Labs (all labs ordered are listed, but only abnormal results are displayed) Labs Reviewed  LACTIC ACID, PLASMA - Abnormal; Notable for the following components:      Result Value   Lactic Acid, Venous 2.6 (*)    All other components within normal limits  CBC WITH DIFFERENTIAL/PLATELET - Abnormal; Notable for the following components:   WBC 21.7 (*)    Hemoglobin 11.8 (*)    Neutro Abs 20.7 (*)    Lymphs Abs 0.4 (*)    Abs Immature Granulocytes 0.12 (*)    All other components within normal limits  COMPREHENSIVE METABOLIC PANEL - Abnormal; Notable for the following components:   Glucose, Bld 142 (*)    BUN 24 (*)    Creatinine, Ser 1.25 (*)    Total Protein 6.2 (*)    Albumin 2.9 (*)    AST 111 (*)    ALT 61 (*)    GFR calc non Af Amer 38 (*)  GFR calc Af Amer 44 (*)    All other components within normal limits  D-DIMER, QUANTITATIVE (NOT AT [facility]) - Abnormal; Notable for the following components:   D-Dimer, Quant 6.42 (*)    All other components within normal limits  LACTATE DEHYDROGENASE - Abnormal; Notable for the following components:   LDH 262 (*)    All other components within normal limits  FIBRINOGEN - Abnormal; Notable for the following components:   Fibrinogen 588 (*)     All other components within normal limits  C-REACTIVE PROTEIN - Abnormal; Notable for the following components:   CRP 5.7 (*)    All other components within normal limits  CBG MONITORING, ED - Abnormal; Notable for the following components:   Glucose-Capillary 130 (*)    All other components within normal limits  TROPONIN I (HIGH SENSITIVITY) - Abnormal; Notable for the following components:   Troponin I (High Sensitivity) 188 (*)    All other components within normal limits  SARS CORONAVIRUS 2 BY RT PCR (HOSPITAL ORDER, PERFORMED IN [facility])  CULTURE, BLOOD (ROUTINE X 2)  CULTURE, BLOOD (ROUTINE X 2)  URINE CULTURE  PROCALCITONIN  FERRITIN  LIPASE, BLOOD  TRIGLYCERIDES  LACTIC ACID, PLASMA  URINALYSIS, ROUTINE W REFLEX MICROSCOPIC  PROTIME-INR  APTT  TROPONIN I (HIGH SENSITIVITY)    EKG EKG Interpretation  Date/Time:  Sunday December 27 2019 13:18:47 EDT Ventricular Rate:  98 PR Interval:    QRS Duration: 128 QT Interval:  377 QTC Calculation: 482 R Axis:   -88 Text Interpretation: Sinus rhythm Atrial premature complex Nonspecific IVCD with LAD LVH with secondary repolarization abnormality no change from previous Confirmed by Arby BarrettePfeiffer, Marcy [phone]([id number]) on 12/27/2019 1:28:41 PM   Radiology DG Abd Acute W/Chest  Result Date: 12/27/2019 CLINICAL DATA:  Shortness of breath, COVID positive, nausea and vomiting. EXAM: DG ABDOMEN ACUTE W/ 1V CHEST COMPARISON:  Chest x-ray dated 04/09/2019. Abdominal x-ray dated 12/30/2013. FINDINGS: Heart size and mediastinal contours are stable. Lungs are clear. Lungs are hyperexpanded. No pleural effusion or pneumothorax is seen. Old healed rib fractures on the LEFT. Overall bowel gas pattern is nonobstructive. No evidence of soft tissue mass or abnormal fluid collection. No evidence of free intraperitoneal air. Extensive aortic atherosclerosis. Degenerative spondylosis of the slightly scoliotic thoracolumbar spine, moderate in degree.  No acute appearing osseous abnormality. IMPRESSION: 1. No active cardiopulmonary disease. No evidence of pneumonia or pulmonary edema. 2. COPD. 3. No acute findings within the abdomen or pelvis. Nonobstructive bowel gas pattern. Electronically Signed   By: Bary RichardStan  Maynard M.D.   On: 12/27/2019 13:51    Procedures Procedures (including critical care time) CRITICAL CARE Performed by: Karrie Meresortni S Lavanya Roa   Total critical care time: 33 minutes  Critical care time was exclusive of separately billable procedures and treating other patients.  Critical care was necessary to treat or prevent imminent or life-threatening deterioration.  Critical care was time spent personally by me on the following activities: development of treatment plan with patient and/or surrogate as well as nursing, discussions with consultants, evaluation of patient's response to treatment, examination of patient, obtaining history from patient or surrogate, ordering and performing treatments and interventions, ordering and review of laboratory studies, ordering and review of radiographic studies, pulse oximetry and re-evaluation of patient's condition.   Medications Ordered in ED Medications  lactated ringers infusion (has no administration in time range)  metroNIDAZOLE (FLAGYL) IVPB 500 mg (500 mg Intravenous New Bag/Given 12/27/19 1451)  vancomycin (VANCOCIN) IVPB 1000  mg/200 mL premix (has no administration in time range)  sodium chloride 0.9 % bolus 500 mL (has no administration in time range)  acetaminophen (TYLENOL) tablet 1,000 mg (1,000 mg Oral Given 12/27/19 1327)  sodium chloride 0.9 % bolus 500 mL (0 mLs Intravenous Stopped 12/27/19 1452)  ondansetron (ZOFRAN) injection 4 mg (4 mg Intravenous Given 12/27/19 1328)  ceFEPIme (MAXIPIME) 2 g in sodium chloride 0.9 % 100 mL IVPB (0 g Intravenous Stopped 12/27/19 1449)    ED Course  I have reviewed the triage vital signs and the nursing notes.  Pertinent labs & imaging results  that were available during my care of the patient were reviewed by me and considered in my medical decision making (see chart for details).  Clinical Course as of Dec 26 1512  Wynelle Link Dec 27, 2019  1511 Unchanged   ED EKG 12-Lead [CG]    Clinical Course User Index [CG] Jerrell Mylar   MDM Rules/Calculators/A&P                          86 year old female presenting for evaluation of cough, shortness of breath, nausea vomiting diarrhea and fever.  Coming from SNF which currently has a Covid outbreak.  On arrival patient noted to be febrile, tachypneic and hypoxic.  Placed on 2 L nasal cannula and sats increased to 93% on room air.  She currently has a normal blood pressure.  Reviewed/interpreted labs CBC with leukocytosis, mild anemia CMP with elevated bun/cr, elevated lfts. Lactic acid elevated at 2.6 Majority of inflammatory markers are elevated. Trop elevated but seems more likely related to demand ischemic given  EKG unchanged   EKG with Sinus rhythm Atrial premature complex Nonspecific IVCD with LAD LVH with secondary repolarization abnormality no change from previous   CXR/abd xray reviewed/interpreted - w/o obvious pna, ptx or other acute cardiopulm abnormality. Normal bowel gas pattern.  At shift change. Pt pending COVID and CT abd/pelvis. Care transitioned to Sharen Heck, PA-C with plan to f/u on pending w/u and admit.   Final Clinical Impression(s) / ED Diagnoses Final diagnoses:  Sepsis with acute organ dysfunction, due to unspecified organism, unspecified type, unspecified whether septic shock present [facility])    Rx / DC Orders ED Discharge Orders    None       Rayne Du 12/27/19 1515    Arby Barrette, MD 12/30/19 [phone]

## 2019-12-27 NOTE — ED Notes (Signed)
Pt transported to IR 

## 2019-12-27 NOTE — ED Provider Notes (Addendum)
Came from [place], COVID outbreak cough, mild CP, n/v/d, abdominal pain Fever, tachypnea, HR > 90, hypoxia now on 2 L [place] WBC 23 Lactic 2.6 Soft SBO 100-114, MAP > 65.   Initiated on broad abx, small IVF, tylenol, h/o HF and MAP > 65 Diffuse abd tenderness on exam Unknown vax status   Admit for severe sepsis unknown source.  Pending COVID, UA, CTAP Assigned  Physical Exam  BP (!) 134/56   Pulse 103   Temp 98.1 F (36.7 C) (Rectal)   Resp (!) 26   Ht 5\' 4"  (1.626 m)   Wt 48.6 kg   SpO2 92%   BMI 18.40 kg/m   ED Course/Procedures   Clinical Course as of Dec 26 2005  Sun Dec 27, 2019  1511 Unchanged   ED EKG 12-Lead [CG]  1555 IMPRESSION: 1. No active cardiopulmonary disease. No evidence of pneumonia or pulmonary edema. 2. COPD. 3. No acute findings within the abdomen or pelvis. Nonobstructive  bowel gas pattern.  DG Abd Acute W/Chest [CG]  1555 188 > 218  Troponin I (High Sensitivity)(!!): 218 [CG]  1555 Pulse Rate: 96 [CG]  1555 BP(!): 116/58 [CG]  1555 BP(!): 104/53 [CG]  1555 Resp(!): 27 [CG]  1556 Fever 102    [CG]  1556 Lactic Acid, Venous(!!): 2.6 [CG]  1556 WBC(!): 21.7 [CG]  1556 Creatinine(!): 1.25 [CG]  1556 AST(!): 111 [CG]  1556 ALT(!): 61 [CG]  1556 GFR, Est Non African American(!): 38 [CG]  1724 CT ABDOMEN PELVIS W CONTRAST [CG]  1724 IMPRESSION: 1. Mild right hydronephrosis secondary to the presence of a linear 3 x 6 x 12 mm stone at or just distal to the right UPJ. Urothelial enhancement around the stone is likely secondary to irritation. 2. Clustered stones likely with staghorn component identified in the left renal pelvis extending into upper and lower pole collecting systems. No substantial hydronephrosis. 3. Irregular circumferential bladder wall thickening with possible left superolateral bladder diverticulum. Bladder infection not excluded. 4. Fluid and gas identified in the right vaginal fornix. No evidence for colovaginal fistula.  Indeterminate finding. Patient did have some gas in the vaginal vault on previous CT from 09/10/2018. 5. Small hiatal hernia. 6. Left base atelectasis or infiltrate. 7. L1 superior endplate compression fracture. 8. Aortic Atherosclerosis (ICD10-I70.0).  CT ABDOMEN PELVIS W CONTRAST [CG]  1753 Last SBP 92.  Discussed with EDP Nanavati, will give additional 1 L IVF. Last echo EF 55-60%. CXR without edema here.  Rre-evaluated patient.  SBP 105.  Ill appearing.  But arousable, tells me her breathing feels "stuffy".  On [place] SpO2 >92%    [CG]  1754 04/17/2020Glori Luis): MODERATE [CG]  1754 RBC / HPF: 21-50 [CG]  1754 WBC, UA(!): >50 [CG]  1754 Bacteria, UA(!): RARE [CG]  1858 PERC nephrostomy tube, will need IR for nephrostomy ASAP. Not stable for urological general anesthesia.    [CG]    Clinical Course User Index [CG] Marland Kitchen, PA-C    .Critical Care Performed by: Liberty Handy, PA-C Authorized by: Liberty Handy, PA-C   Critical care provider statement:    Critical care time (minutes):  45   Critical care was necessary to treat or prevent imminent or life-threatening deterioration of the following conditions:  Sepsis and respiratory failure   Critical care was time spent personally by me on the following activities:  Discussions with consultants, evaluation of patient's response to treatment, examination of patient, ordering and performing treatments and interventions, ordering and review  of laboratory studies, ordering and review of radiographic studies, pulse oximetry, re-evaluation of patient's condition, obtaining history from patient or surrogate, review of old charts and development of treatment plan with patient or surrogate   I assumed direction of critical care for this patient from another provider in my specialty: no      MDM   Patient with severe sepsis.  Urinalysis suspicious for infection.  CT A/P shows large right UPJ stone with irregular bladder wall  thickening.  Of note, some fluid and gas in the right vaginal fornix without evidence of colovaginal fistula.  Patient did have gas in vaginal vault on previous CT scan.  Broad-spectrum IV antibiotics ordered by previous team.  She has received close to 2 L IV fluid.  Initial hypotension has responded to IVF, last BP 134/56.  Labs show lactic acidosis 2.6.  Troponin 118 trending up to 218.  Question demand ischemia.  LFTs and creatinine also elevated.  Blood culture and urine culture sent.  Pending urology consult.  Suspect etiology of infection is UPJ stone.  Will admit to hospitalist service.  1830: Patient reevaluated, appears ill but otherwise stable vital signs.  Still arousable to voice, can follow commands.  Denies any pain.  States her breathing feels "stuffy".  SPO2 greater than 92% on 2 L [place].  2008: Patient discussed with urology Dr. Sande Brothers deems patient likely unstable for general anesthesia, recommends IR consult for [facility] nephrostomy.  I spoke with Dr. Grace Isaac with IR, plan to take patient to IR suite soon as possible.  IR requesting emergent consent.  Patient does not appear to be in state of mind to provide consent.  Is able to tell me her name but unsure the extent that she understands.  I have attempted to call emergency contact, niece, x3 and unable to discuss patient care.  ED RN to fill out consent here.   Liberty Handy, PA-C 12/27/19 1833    Liberty Handy, PA-C 12/27/19 2009    Derwood Kaplan, MD 12/27/19 [phone]

## 2019-12-28 ENCOUNTER — Inpatient Hospital Stay (HOSPITAL_COMMUNITY): Payer: Medicare PPO

## 2019-12-28 DIAGNOSIS — I5031 Acute diastolic (congestive) heart failure: Secondary | ICD-10-CM

## 2019-12-28 LAB — PROCALCITONIN: Procalcitonin: 48.8 ng/mL

## 2019-12-28 LAB — COMPREHENSIVE METABOLIC PANEL
ALT: 23 U/L (ref 0–44)
AST: 28 U/L (ref 15–41)
Albumin: 1.4 g/dL — ABNORMAL LOW (ref 3.5–5.0)
Alkaline Phosphatase: 48 U/L (ref 38–126)
Anion gap: 15 (ref 5–15)
BUN: 15 mg/dL (ref 8–23)
CO2: 16 mmol/L — ABNORMAL LOW (ref 22–32)
Calcium: 7.3 mg/dL — ABNORMAL LOW (ref 8.9–10.3)
Chloride: 106 mmol/L (ref 98–111)
Creatinine, Ser: 0.73 mg/dL (ref 0.44–1.00)
GFR calc Af Amer: >60 mL/min (ref >60)
GFR calc non Af Amer: >60 mL/min (ref >60)
Glucose, Bld: 72 mg/dL (ref 70–99)
Potassium: 4.2 mmol/L (ref 3.5–5.1)
Sodium: 137 mmol/L (ref 135–145)
Total Bilirubin: 0.5 mg/dL (ref 0.3–1.2)
Total Protein: 3.3 g/dL — ABNORMAL LOW (ref 6.5–8.1)

## 2019-12-28 LAB — TROPONIN I (HIGH SENSITIVITY): Troponin I (High Sensitivity): 126 ng/L (ref <18)

## 2019-12-28 LAB — ECHOCARDIOGRAM COMPLETE
Height: 64 in
S' Lateral: 1.9 cm
Weight: 1715.2 oz

## 2019-12-28 LAB — CBC WITH DIFFERENTIAL/PLATELET
Abs Immature Granulocytes: 0.29 10*3/uL — ABNORMAL HIGH (ref 0.00–0.07)
Basophils Absolute: 0.1 10*3/uL (ref 0.0–0.1)
Basophils Relative: 0 %
Eosinophils Absolute: 0 10*3/uL (ref 0.0–0.5)
Eosinophils Relative: 0 %
HCT: 28.9 % — ABNORMAL LOW (ref 36.0–46.0)
Hemoglobin: 8.6 g/dL — ABNORMAL LOW (ref 12.0–15.0)
Immature Granulocytes: 1 %
Lymphocytes Relative: 4 %
Lymphs Abs: 1.1 10*3/uL (ref 0.7–4.0)
MCH: 29.6 pg (ref 26.0–34.0)
MCHC: 29.8 g/dL — ABNORMAL LOW (ref 30.0–36.0)
MCV: 99.3 fL (ref 80.0–100.0)
Monocytes Absolute: 1.1 10*3/uL — ABNORMAL HIGH (ref 0.1–1.0)
Monocytes Relative: 4 %
Neutro Abs: 22.7 10*3/uL — ABNORMAL HIGH (ref 1.7–7.7)
Neutrophils Relative %: 91 %
Platelets: 188 10*3/uL (ref 150–400)
RBC: 2.91 MIL/uL — ABNORMAL LOW (ref 3.87–5.11)
RDW: 13.9 % (ref 11.5–15.5)
WBC: 25.3 10*3/uL — ABNORMAL HIGH (ref 4.0–10.5)
nRBC: 0 % (ref 0.0–0.2)

## 2019-12-28 LAB — URINE CULTURE

## 2019-12-28 LAB — APTT: aPTT: 42 seconds — ABNORMAL HIGH (ref 24–36)

## 2019-12-28 LAB — CORTISOL-AM, BLOOD: Cortisol - AM: 24.2 ug/dL — ABNORMAL HIGH (ref 6.7–22.6)

## 2019-12-28 LAB — LACTIC ACID, PLASMA: Lactic Acid, Venous: 4.2 mmol/L (ref 0.5–1.9)

## 2019-12-28 LAB — MAGNESIUM: Magnesium: 0.9 mg/dL — CL (ref 1.7–2.4)

## 2019-12-28 MED ORDER — MAGNESIUM SULFATE 4 GM/100ML IV SOLN
4.0000 g | Freq: Once | INTRAVENOUS | Status: AC
  Administered 2019-12-28: 4 g via INTRAVENOUS
  Filled 2019-12-28: qty 100

## 2019-12-28 MED ORDER — SODIUM CHLORIDE 0.9% FLUSH
5.0000 mL | Freq: Three times a day (TID) | INTRAVENOUS | Status: DC
  Administered 2019-12-28 – 2020-01-03 (×16): 5 mL

## 2019-12-28 NOTE — Progress Notes (Signed)
°  Echocardiogram 2D Echocardiogram has been performed.  Celene Skeen 12/28/2019, 12:09 PM

## 2019-12-28 NOTE — Consult Note (Signed)
Urology Consult   Physician requesting consult: Shauna Hugh, MD  Reason for consult: Severe sepsis secondary to obstructing right UVJ calculus  History of Present Illness: [name] is a 86 y.o. female initially presented to the [facility] emergency department on 12/27/2019 with severe right-sided flank pain and hypotension.  CT of the abdomen pelvis initially revealed an obstructing 1 cm right UVP calculus along with a nonobstructing staghorn calculus involving the left kidney.  She is s/p right PCN placement.  The patient has no family at bedside.  She currently is complaining of right-sided flank pain due to her nephrostomy tube.    The patient denies a history of hematuria, UTIs, STDs, urolithiasis, GU malignancy/trauma/surgery.  PMHx as outlined below.   Past Medical History:  Diagnosis Date  . Allergy   . Asthma   . CAD (coronary artery disease)    s/p CABG x 5 in 1997 & CABG x 3 in 2010; Last cath in April of 2010 showed grafts to be patent. EF is 40 to 45%  . CHF (congestive heart failure) (HCC)    EF is 40 to 45%, 06/2015 EF 55-60%  . Depression    following bypass surgery  . Diverticulosis   . Endometriosis   . GERD (gastroesophageal reflux disease)   . Hepatitis A   . Hiatal hernia   . History of cerebrovascular disease   . History of vertebral fracture    at T8  . Hyperlipemia   . Hypertension   . LBP (low back pain)   . Mixed incontinence urge and stress 04/22/2013  . Nocturia 04/22/2013  . Pleural effusion    post surgical  . PVD (peripheral vascular disease) (HCC)    S/P right CEA  . Sleep apnea   . Stroke [facility]) 2010    Past Surgical History:  Procedure Laterality Date  . APPENDECTOMY    . CARDIAC CATHETERIZATION  April 2010   Grafts were patent. EF is 40 to 45%  . CAROTID ENDARTERECTOMY     right  . CORONARY ARTERY BYPASS GRAFT  [id number]   Original surgery in 1997 x 5; Redo CABG x 3 in 2010 including free RIMA to distal LAD, SVG to OM and SVG to  left posterolateral  . INTRAMEDULLARY (IM) NAIL INTERTROCHANTERIC Right 11/19/2018   Procedure: INTRAMEDULLARY (IM) NAIL INTERTROCHANTRIC;  Surgeon: Yolonda Kida, MD;  Location[facility] OR;  Service: Orthopedics;  Laterality: Right;  . IR NEPHROSTOMY PLACEMENT RIGHT  12/27/2019  . KNEE SURGERY     left  . LEFT HEART CATHETERIZATION WITH CORONARY/GRAFT ANGIOGRAM N/A 03/12/2013   Procedure: LEFT HEART CATHETERIZATION WITH Isabel Caprice;  Surgeon: Peter M Swaziland, MD;  Location: [facility] CATH LAB;  Service: Cardiovascular;  Laterality: N/A;  . PARTIAL COLECTOMY     due to colon abcess    Current Hospital Medications:  Home Meds:  Current Meds  Medication Sig  . aspirin EC 81 MG tablet Take 1 tablet (81 mg total) by mouth daily with breakfast.  . Calcium Carbonate-Vitamin D (OYSTER SHELL CALCIUM 500 + D PO) Take 1 tablet by mouth in the morning and at bedtime.  . Cholecalciferol (VITAMIN D3) 1.25 MG (50000 UT) TABS Take 1 tablet by mouth daily.  Marland Kitchen docusate sodium (COLACE) 100 MG capsule Take 1 capsule (100 mg total) by mouth 2 (two) times daily.  Marland Kitchen escitalopram (LEXAPRO) 10 MG tablet Take 15 mg by mouth daily.  Marland Kitchen esomeprazole (NEXIUM) 20 MG capsule Take 20 mg by mouth daily  at 12 noon.   . gabapentin (NEURONTIN) 100 MG capsule Take 100 mg by mouth 2 (two) times daily.  Marland Kitchen guaiFENesin (MUCINEX) 600 MG 12 hr tablet Take 600 mg by mouth 2 (two) times daily.  . isosorbide mononitrate (IMDUR) 30 MG 24 hr tablet Take 30 mg by mouth daily.  . isosorbide mononitrate (IMDUR) 60 MG 24 hr tablet TAKE 1 TABLET BY MOUTH DAILY. PLEASE MAKE YEARLY APPT WITH DR. Elease Hashimoto BEFORE ANYMORE REFILLS (Patient taking differently: Take 60 mg by mouth daily. )  . lidocaine (LIDODERM) 5 % Place 1 patch onto the skin 2 (two) times daily. Remove & Discard patch within 12 hours or as directed by MD  . melatonin 5 MG TABS Take 5 mg by mouth at bedtime.  . nebivolol (BYSTOLIC) 10 MG tablet Take 1 tablet (10 mg total) by  mouth daily. Hold for SBP < 110 mmhg and HR < 60 BPM  . nitroGLYCERIN (NITROSTAT) 0.4 MG SL tablet Place 1 tablet (0.4 mg total) under the tongue every 5 (five) minutes as needed for chest pain.  Marland Kitchen oxyCODONE (OXY IR/ROXICODONE) 5 MG immediate release tablet Take 10 mg by mouth every 4 (four) hours as needed for severe pain.  . potassium chloride (MICRO-K) 10 MEQ CR capsule Take 1 capsule (10 mEq total) by mouth daily.  . rosuvastatin (CRESTOR) 20 MG tablet Take 1 tablet (20 mg total) by mouth daily. Pt needs to keep appt in Oct for further refills    Scheduled Meds: . heparin injection (subcutaneous)  5,000 Units Subcutaneous Q8H  . pantoprazole (PROTONIX) IV  40 mg Intravenous Q24H  . sodium chloride flush  5 mL Intracatheter Q8H   Continuous Infusions: . lactated ringers 100 mL/hr at 12/28/19 1735  . piperacillin-tazobactam (ZOSYN)  IV Stopped (12/28/19 1606)   PRN Meds:.acetaminophen **OR** acetaminophen, albuterol, hydrALAZINE, ondansetron **OR** ondansetron (ZOFRAN) IV  Allergies:  Allergies  Allergen Reactions  . Metoprolol Other (See Comments)    allopoecia  . Phenergan [Promethazine Hcl] Other (See Comments)    Facial numbness  . Prochlorperazine Edisylate Anaphylaxis  . Zetia [Ezetimibe] Nausea Only and Other (See Comments)    Abdominal pain  . Compazine [Prochlorperazine]     Facial  Paralysis       . Sulfur Other (See Comments)    paralyzes  face   . Niacin Rash  . Sulfonamide Derivatives Nausea And Vomiting    Family History  Problem Relation Age of Onset  . Heart attack Mother   . Diabetes Mother   . Heart disease Mother   . Rheum arthritis Mother   . Heart attack Father   . Allergies Father   . Diabetes Sister   . Stroke Sister   . Heart disease Sister   . Dementia Brother   . Healthy Sister   . Other Brother        LUNG ISSUES  . Other Brother        LUNG ISSUES  . Other Brother        LUNG ISSUES  . Diabetes Maternal Grandmother   .  Diabetes Paternal Grandmother   . Colon cancer Neg Hx     Social History:  reports that she has never smoked. She has never used smokeless tobacco. She reports that she does not drink alcohol and does not use drugs.  ROS: A complete review of systems was performed.  All systems are negative except for pertinent findings as noted.  Physical Exam:  Vital signs in last  24 hours: Temp:  [97.1 F (36.2 C)] 97.1 F (36.2 C) (08/02 1722) Pulse Rate:  [77-112] 85 (08/02 1730) Resp:  [16-29] 24 (08/02 1730) BP: (85-141)/(38-88) 114/48 (08/02 1730) SpO2:  [88 %-98 %] 95 % (08/02 1730) Constitutional:  Disoriented x3 Cardiovascular: Regular rate and rhythm, No JVD Respiratory: Normal respiratory effort, Lungs clear bilaterally GI: Abdomen is soft, nontender, nondistended, no abdominal masses GU: No CVA tenderness Lymphatic: No lymphadenopathy Neurologic: Grossly intact, no focal deficits Psychiatric: Confused GU:  Right PCN in place and draining concentrated, blood tinged urine  Laboratory Data:  Recent Labs    12/27/19 1301 12/27/19 2009 12/28/19 0421  WBC 21.7*  --  25.3*  HGB 11.8* 10.9* 8.6*  HCT 38.0 32.0* 28.9*  PLT 304  --  188    Recent Labs    12/27/19 1301 12/27/19 2009 12/28/19 0421  NA 140 139 137  K 3.8 4.1 4.2  CL 101  --  106  GLUCOSE 142*  --  72  BUN 24*  --  15  CALCIUM 9.6  --  7.3*  CREATININE 1.25*  --  0.73     Results for orders placed or performed during the hospital encounter of 12/27/19 (from the past 24 hour(s))  I-Stat arterial blood gas, ED     Status: Abnormal   Collection Time: 12/27/19  8:09 PM  Result Value Ref Range   pH, Arterial 7.323 (L) 7.35 - 7.45   pCO2 arterial 48.4 (H) 32 - 48 mmHg   pO2, Arterial 91 83 - 108 mmHg   Bicarbonate 25.2 20.0 - 28.0 mmol/L   TCO2 27 22 - 32 mmol/L   O2 Saturation 96.0 %   Acid-base deficit 1.0 0.0 - 2.0 mmol/L   Sodium 139 135 - 145 mmol/L   Potassium 4.1 3.5 - 5.1 mmol/L   Calcium, Ion  1.25 1.15 - 1.40 mmol/L   HCT 32.0 (L) 36 - 46 %   Hemoglobin 10.9 (L) 12.0 - 15.0 g/dL   Patient temperature 16.1 F    Collection site Radial    Drawn by Operator    Sample type ARTERIAL   Lactic acid, plasma     Status: Abnormal   Collection Time: 12/27/19 11:30 PM  Result Value Ref Range   Lactic Acid, Venous 4.2 (HH) 0.5 - 1.9 mmol/L  Troponin I (High Sensitivity)     Status: Abnormal   Collection Time: 12/27/19 11:30 PM  Result Value Ref Range   Troponin I (High Sensitivity) 126 (HH) <18 ng/L  Cortisol-am, blood     Status: Abnormal   Collection Time: 12/28/19  4:21 AM  Result Value Ref Range   Cortisol - AM 24.2 (H) 6.7 - 22.6 ug/dL  Procalcitonin     Status: None   Collection Time: 12/28/19  4:21 AM  Result Value Ref Range   Procalcitonin 48.80 ng/mL  Comprehensive metabolic panel     Status: Abnormal   Collection Time: 12/28/19  4:21 AM  Result Value Ref Range   Sodium 137 135 - 145 mmol/L   Potassium 4.2 3.5 - 5.1 mmol/L   Chloride 106 98 - 111 mmol/L   CO2 16 (L) 22 - 32 mmol/L   Glucose, Bld 72 70 - 99 mg/dL   BUN 15 8 - 23 mg/dL   Creatinine, Ser 0.96 0.44 - 1.00 mg/dL   Calcium 7.3 (L) 8.9 - 10.3 mg/dL   Total Protein 3.3 (L) 6.5 - 8.1 g/dL   Albumin 1.4 (L) 3.5 -  5.0 g/dL   AST 28 15 - 41 U/L   ALT 23 0 - 44 U/L   Alkaline Phosphatase 48 38 - 126 U/L   Total Bilirubin 0.5 0.3 - 1.2 mg/dL   GFR calc non Af Amer >60 >60 mL/min   GFR calc Af Amer >60 >60 mL/min   Anion gap 15 5 - 15  Magnesium     Status: Abnormal   Collection Time: 12/28/19  4:21 AM  Result Value Ref Range   Magnesium 0.9 (LL) 1.7 - 2.4 mg/dL  APTT     Status: Abnormal   Collection Time: 12/28/19  4:21 AM  Result Value Ref Range   aPTT 42 (H) 24 - 36 seconds  CBC WITH DIFFERENTIAL     Status: Abnormal   Collection Time: 12/28/19  4:21 AM  Result Value Ref Range   WBC 25.3 (H) 4.0 - 10.5 K/uL   RBC 2.91 (L) 3.87 - 5.11 MIL/uL   Hemoglobin 8.6 (L) 12.0 - 15.0 g/dL   HCT 16.1 (L) 36 -  46 %   MCV 99.3 80.0 - 100.0 fL   MCH 29.6 26.0 - 34.0 pg   MCHC 29.8 (L) 30.0 - 36.0 g/dL   RDW 09.6 04.5 - 40.9 %   Platelets 188 150 - 400 K/uL   nRBC 0.0 0.0 - 0.2 %   Neutrophils Relative % 91 %   Neutro Abs 22.7 (H) 1.7 - 7.7 K/uL   Lymphocytes Relative 4 %   Lymphs Abs 1.1 0.7 - 4.0 K/uL   Monocytes Relative 4 %   Monocytes Absolute 1.1 (H) 0 - 1 K/uL   Eosinophils Relative 0 %   Eosinophils Absolute 0.0 0 - 0 K/uL   Basophils Relative 0 %   Basophils Absolute 0.1 0 - 0 K/uL   WBC Morphology DOHLE BODIES    Immature Granulocytes 1 %   Abs Immature Granulocytes 0.29 (H) 0.00 - 0.07 K/uL   Recent Results (from the past 240 hour(s))  Blood Culture (routine x 2)     Status: None (Preliminary result)   Collection Time: 12/27/19  1:01 PM   Specimen: BLOOD  Result Value Ref Range Status   Specimen Description BLOOD SITE NOT SPECIFIED  Final   Special Requests   Final    BOTTLES DRAWN AEROBIC AND ANAEROBIC Blood Culture adequate volume   Culture  Setup Time   Final    GRAM NEGATIVE RODS Organism ID to follow ANAEROBIC BOTTLE ONLY    Culture   Final    NO GROWTH < 24 HOURS Performed at [facility], [address], [place], [place] [address]    Report Status PENDING  Incomplete  Blood Culture ID Panel (Reflexed)     Status: Abnormal (Preliminary result)   Collection Time: 12/27/19  1:01 PM  Result Value Ref Range Status   Enterococcus species PENDING NOT DETECTED Incomplete   Listeria monocytogenes NOT DETECTED NOT DETECTED Final   Staphylococcus species NOT DETECTED NOT DETECTED Final   Staphylococcus aureus (BCID) PENDING NOT DETECTED Incomplete   Streptococcus species NOT DETECTED NOT DETECTED Final   Streptococcus agalactiae NOT DETECTED NOT DETECTED Final   Streptococcus pneumoniae NOT DETECTED NOT DETECTED Final   Streptococcus pyogenes NOT DETECTED NOT DETECTED Final   Acinetobacter baumannii PENDING NOT DETECTED Incomplete   Enterobacteriaceae species  PENDING NOT DETECTED Incomplete   Enterobacter cloacae complex NOT DETECTED NOT DETECTED Final   Escherichia coli NOT DETECTED NOT DETECTED Final   Klebsiella  oxytoca NOT DETECTED NOT DETECTED Final   Klebsiella pneumoniae NOT DETECTED NOT DETECTED Final   Proteus species DETECTED (A) NOT DETECTED Final    Comment: CRITICAL RESULT CALLED TO, READ BACK BY AND VERIFIED WITH: A. Artis Flock PharmD 15:00 12/28/19 ([email or web address])    Serratia marcescens NOT DETECTED NOT DETECTED Final   Haemophilus influenzae NOT DETECTED NOT DETECTED Final   Neisseria meningitidis NOT DETECTED NOT DETECTED Final   Pseudomonas aeruginosa NOT DETECTED NOT DETECTED Final   Candida albicans NOT DETECTED NOT DETECTED Final   Candida glabrata NOT DETECTED NOT DETECTED Final   Candida krusei NOT DETECTED NOT DETECTED Final   Candida parapsilosis NOT DETECTED NOT DETECTED Final   Candida tropicalis NOT DETECTED NOT DETECTED Final    Comment: Performed at [facility], [address] [place], [place] [address]  SARS Coronavirus 2 by RT PCR (hospital order, performed in [facility] hospital lab) Nasopharyngeal Nasopharyngeal Swab     Status: None   Collection Time: 12/27/19  1:25 PM   Specimen: Nasopharyngeal Swab  Result Value Ref Range Status   SARS Coronavirus 2 NEGATIVE NEGATIVE Final    Comment: (NOTE) SARS-CoV-2 target nucleic acids are NOT DETECTED.  The SARS-CoV-2 RNA is generally detectable in upper and lower respiratory specimens during the acute phase of infection. The lowest concentration of SARS-CoV-2 viral copies this assay can detect is 250 copies / mL. A negative result does not preclude SARS-CoV-2 infection and should not be used as the sole basis for treatment or other patient management decisions.  A negative result may occur with improper specimen collection / handling, submission of specimen other than nasopharyngeal swab, presence of viral mutation(s) within the areas targeted by this assay, and  inadequate number of viral copies (<250 copies / mL). A negative result must be combined with clinical observations, patient history, and epidemiological information.  Fact Sheet for Patients:   BoilerBrush.com.cy  Fact Sheet for Healthcare Providers: https://pope.com/  This test is not yet approved or  cleared by the [place] FDA and has been authorized for detection and/or diagnosis of SARS-CoV-2 by FDA under an Emergency Use Authorization (EUA).  This EUA will remain in effect (meaning this test can be used) for the duration of the COVID-19 declaration under Section 564(b)(1) of the Act, 21 U.S.C. section 360bbb-3(b)(1), unless the authorization is terminated or revoked sooner.  Performed at [facility], [address], [place], [place] [address]     Renal Function: Recent Labs    12/27/19 1301 12/28/19 0421  CREATININE 1.25* 0.73   Estimated Creatinine Clearance: 37.3 mL/min (by C-G formula based on SCr of 0.73 mg/dL).  Radiologic Imaging: DG Chest 1 View  Result Date: 12/27/2019 CLINICAL DATA:  Shortness of breath and respiratory failure EXAM: CHEST  1 VIEW COMPARISON:  April 09, 2019 FINDINGS: There is mild cardiomegaly. Aortic knob calcifications are seen. Overlying median sternotomy wires are present. Patchy airspace opacity seen within the left lung base and right lung base. There is diffusely increased interstitial markings throughout both lungs. No acute osseous abnormality. Cement fixation seen within the mid thoracic spine. IMPRESSION: Patchy interstitial airspace opacities throughout both lungs which could be due to asymmetric edema and/or infectious etiology Electronically Signed   By: Jonna Clark M.D.   On: 12/27/2019 20:45   CT ABDOMEN PELVIS W CONTRAST  Result Date: 12/27/2019 CLINICAL DATA:  Infectious gastroenteritis or colitis. EXAM: CT ABDOMEN AND PELVIS WITH CONTRAST TECHNIQUE: Multidetector CT  imaging of the abdomen  and pelvis was performed using the standard protocol following bolus administration of intravenous contrast. CONTRAST:  80mL OMNIPAQUE IOHEXOL 300 MG/ML  SOLN COMPARISON:  09/10/2018 FINDINGS: Lower chest: Heart is enlarged. Left base atelectasis or infiltrate. Hepatobiliary: Small area of low attenuation in the anterior liver, adjacent to the falciform ligament, is in a characteristic location for focal fatty deposition in similar to prior. Probable tiny layering gallstones. No intrahepatic or extrahepatic biliary dilation. Pancreas: No focal mass lesion. No dilatation of the main duct. No intraparenchymal cyst. No peripancreatic edema. Spleen: No splenomegaly. No focal mass lesion. Adrenals/Urinary Tract: No adrenal nodule or mass. 2 x 4 mm nonobstructing stone identified posterior interpolar right kidney. A second tiny 2 mm posterior interpolar right renal stone evident. There is mild right hydronephrosis secondary to the presence of a linear 3 x 6 x 12 mm stone at or just distal to the right UPJ. Urothelial enhancement around the stone is likely secondary to irritation. There is some fullness in the right ureter below this stone but no additional right ureteral stone evident. There is a phlebolith in the right pelvis seen to be below the ureter on coronal image 47 of series 7. Clustered stones likely with staghorn component identified in the left renal pelvis extending into upper and lower pole collecting systems. No substantial hydronephrosis. No left hydroureter. There is irregular circumferential bladder wall thickening with possible left superolateral bladder diverticulum on coronal image 40 of series 7. Stomach/Bowel: Small hiatal hernia. Duodenum is normally positioned as is the ligament of Treitz. No small bowel wall thickening. No small bowel dilatation. The terminal ileum is normal. No gross colonic mass. No colonic wall thickening. Diverticular changes are noted in the left colon  without evidence of diverticulitis. Mild wall thickening in the mid sigmoid colon may be related to muscular hypertrophy from the diverticulosis. No features of diverticulitis. Vascular/Lymphatic: There is abdominal aortic atherosclerosis without aneurysm. There is no gastrohepatic or hepatoduodenal ligament lymphadenopathy. No retroperitoneal or mesenteric lymphadenopathy. No pelvic sidewall lymphadenopathy. Reproductive: Fluid and gas identified in the right vaginal fornix (image 72/series 3). No obvious colovaginal fistula. Insert no adnexal mass Other: No intraperitoneal free fluid. Musculoskeletal: Bones are diffusely demineralized. Status post right hip replacement.L1 superior endplate compression fracture noted. IMPRESSION: 1. Mild right hydronephrosis secondary to the presence of a linear 3 x 6 x 12 mm stone at or just distal to the right UPJ. Urothelial enhancement around the stone is likely secondary to irritation. 2. Clustered stones likely with staghorn component identified in the left renal pelvis extending into upper and lower pole collecting systems. No substantial hydronephrosis. 3. Irregular circumferential bladder wall thickening with possible left superolateral bladder diverticulum. Bladder infection not excluded. 4. Fluid and gas identified in the right vaginal fornix. No evidence for colovaginal fistula. Indeterminate finding. Patient did have some gas in the vaginal vault on previous CT from 09/10/2018. 5. Small hiatal hernia. 6. Left base atelectasis or infiltrate. 7. L1 superior endplate compression fracture. 8. Aortic Atherosclerosis (ICD10-I70.0). Electronically Signed   By: Kennith Center M.D.   On: 12/27/2019 17:13   DG Abd Acute W/Chest  Result Date: 12/27/2019 CLINICAL DATA:  Shortness of breath, COVID positive, nausea and vomiting. EXAM: DG ABDOMEN ACUTE W/ 1V CHEST COMPARISON:  Chest x-ray dated 04/09/2019. Abdominal x-ray dated 12/30/2013. FINDINGS: Heart size and mediastinal  contours are stable. Lungs are clear. Lungs are hyperexpanded. No pleural effusion or pneumothorax is seen. Old healed rib fractures on the LEFT. Overall bowel gas pattern is  nonobstructive. No evidence of soft tissue mass or abnormal fluid collection. No evidence of free intraperitoneal air. Extensive aortic atherosclerosis. Degenerative spondylosis of the slightly scoliotic thoracolumbar spine, moderate in degree. No acute appearing osseous abnormality. IMPRESSION: 1. No active cardiopulmonary disease. No evidence of pneumonia or pulmonary edema. 2. COPD. 3. No acute findings within the abdomen or pelvis. Nonobstructive bowel gas pattern. Electronically Signed   By: Bary RichardStan  Maynard M.D.   On: 12/27/2019 13:51   ECHOCARDIOGRAM COMPLETE  Result Date: 12/28/2019    ECHOCARDIOGRAM REPORT   Patient Name:   Joycelyn DasLOU B Carley Date of Exam: 12/28/2019 Medical Rec #:  [id number]   Height:       64.0 in Accession #:    [id number]  Weight:       107.2 lb Date of Birth:  [birth year]   BSA:          1.501 m Patient Age:    89 years    BP:           112/45 mmHg Patient Gender: F           HR:           80 bpm. Exam Location:  Inpatient Procedure: 2D Echo Indications:    428.31; I50.31 Acute diastolic (congestive) heart failure  History:        Patient has prior history of Echocardiogram examinations, most                 recent 07/20/2015. CHF, CAD, Prior CABG, Stroke; Risk                 Factors:Hypertension and Dyslipidemia.  Sonographer:    Celene SkeenVijay Shankar RDCS (AE) Referring Phys: [id number] Deno LungerGEORGE J [facility] CenterHALHOUB  Sonographer Comments: No apical window. restricted mobility IMPRESSIONS  1. No apical images obtained to evaluate diastolic function. This study is limited for diagnostic assessment.  2. Left ventricular ejection fraction, by estimation, is 65 to 70%. The left ventricle has normal function. Left ventricular endocardial border not optimally defined to evaluate regional wall motion. There is mild asymmetric left ventricular  hypertrophy  of the septal segment. Left ventricular diastolic function could not be evaluated.  3. Right ventricular systolic function is normal. The right ventricular size is normal. There is mildly elevated pulmonary artery systolic pressure. The estimated right ventricular systolic pressure is 43.1 mmHg.  4. The mitral valve is degenerative. Trivial mitral valve regurgitation.  5. The tricuspid valve is degenerative. Tricuspid valve regurgitation is mild to moderate.  6. The aortic valve is abnormal. AV is incompletely assessed. FINDINGS  Left Ventricle: Left ventricular ejection fraction, by estimation, is 65 to 70%. The left ventricle has normal function. Left ventricular endocardial border not optimally defined to evaluate regional wall motion. The left ventricular internal cavity size was normal in size. There is mild asymmetric left ventricular hypertrophy of the septal segment. Left ventricular diastolic function could not be evaluated. Right Ventricle: The right ventricular size is normal. Right vetricular wall thickness was not assessed. Right ventricular systolic function is normal. There is mildly elevated pulmonary artery systolic pressure. The tricuspid regurgitant velocity is 2.65 m/s, and with an assumed right atrial pressure of 15 mmHg, the estimated right ventricular systolic pressure is 43.1 mmHg. Left Atrium: Left atrial size was not assessed. Right Atrium: Right atrial size was not assessed. Pericardium: There is no evidence of pericardial effusion. Mitral Valve: The mitral valve is degenerative in appearance. Moderate mitral annular calcification. Trivial mitral valve regurgitation. Tricuspid Valve:  The tricuspid valve is degenerative in appearance. Tricuspid valve regurgitation is mild to moderate. Aortic Valve: The aortic valve is abnormal. Aortic valve regurgitation is not visualized. There is moderate calcification of the aortic valve. Pulmonic Valve: The pulmonic valve was not well  visualized. Pulmonic valve regurgitation is not visualized. Aorta: The aortic root is normal in size and structure. IAS/Shunts: The interatrial septum was not well visualized.  LEFT VENTRICLE PLAX 2D LVIDd:         3.10 cm LVIDs:         1.90 cm LV PW:         1.00 cm LV IVS:        1.20 cm LVOT diam:     2.00 cm LVOT Area:     3.14 cm  LEFT ATRIUM         Index LA diam:    3.40 cm 2.27 cm/m   AORTA Ao Root diam: 2.60 cm TRICUSPID VALVE TR Peak grad:   28.1 mmHg TR Vmax:        265.00 cm/s  SHUNTS Systemic Diam: 2.00 cm Weston Brass MD Electronically signed by Weston Brass MD Signature Date/Time: 12/28/2019/5:40:27 PM    Final    IR NEPHROSTOMY PLACEMENT RIGHT  Result Date: 12/27/2019 INDICATION: Urinary sepsis secondary to obstructing stone at the level of the right UPJ/superior ureter. Request made for placement of a right-sided percutaneous nephrostomy catheter for infection source control purposes. EXAM: 1. ULTRASOUND GUIDANCE FOR PUNCTURE OF THE LEFT/RIGHT RENAL COLLECTING SYSTEM 2. RIGHT PERCUTANEOUS NEPHROSTOMY TUBE PLACEMENT. COMPARISON:  CT abdomen pelvis-12/27/2019 MEDICATIONS: Patient has received multiple intravenous antibiotics while in the emergency department.; The antibiotic was administered in an appropriate time frame prior to skin puncture. ANESTHESIA/SEDATION: Moderate (conscious) sedation was employed during this procedure. A total of Versed 0.5 mg and Fentanyl 25 mcg was administered intravenously. Moderate Sedation Time: 17 minutes. The patient's level of consciousness and vital signs were monitored continuously by radiology nursing throughout the procedure under my direct supervision. CONTRAST:  10 cc mL Isovue 300 - administered into the renal collecting system FLUOROSCOPY TIME:  3 minutes, 24 seconds (22 mGy) COMPLICATIONS: None immediate. PROCEDURE: The procedure, risks, benefits, and alternatives were explained to the patient. Questions regarding the procedure were encouraged and  answered. The patient understands and consents to the procedure. A timeout was performed prior to the initiation of the procedure. The right flank region was prepped and draped in the usual sterile fashion and a sterile drape was applied covering the operative field. A sterile gown and sterile gloves were used for the procedure. Local anesthesia was provided with 1% Lidocaine with epinephrine. Ultrasound was used to localize the right kidney. Attempts were made to access a posterior calyx with ultrasound guidance however this proved difficult secondary to angulation of kidney as well as patient's positioning on the fluoroscopy table. Fluoroscopic imaging demonstrated contrast from preceding abdominal CT within the right renal collecting system. As such, a posteroinferior calyx was targeted fluoroscopically with a 20 gauge needle. Access to the collecting system was confirmed with advancement of Nitrex wire to the level the right renal pelvis. Over a Nitrex wire, the tract was dilated with an Accustick stent. Next, under intermittent fluoroscopic guidance and over a short Amplatz wire, the track was dilated ultimately allowing placement of a 10-French percutaneous nephrostomy catheter which was advanced to the level of the renal pelvis where the coil was formed and locked. Contrast was injected and several spot fluoroscopic images were obtained in  various obliquities. The catheter was secured at the skin with a Prolene retention suture and stat lock device and connected to a gravity bag was placed. Dressings were applied. The patient tolerated procedure well without immediate postprocedural complication. FINDINGS: Ultrasound scanning demonstrates a mildly dilated right renal collecting system. Under fluoroscopic guidance, a contrast opacified posteroinferior calyx was targeted allowing placement of a 10-French percutaneous nephrostomy catheter with end coiled and locked within the renal pelvis. Contrast injection  confirmed appropriate positioning. IMPRESSION: Successful ultrasound and fluoroscopic guided placement of a right sided 10 French PCN. Electronically Signed   By: Simonne Come M.D.   On: 12/27/2019 21:18    I independently reviewed the above imaging studies.  Impression/Recommendation 1.  Septic shock secondary to obstructing 1 cm right UVJ calculus  2.  Left partial staghorn stone  -Blood and urine cultures are pending.  Continue broad-spectrum antibiotic coverage -The patient will potentially need multiple ureteroscopic surgeries to clear her of her significant bilateral stone burden, with the priority being the obstructing stone on the right.  She is not a candidate for PCNL. I will attempt to get in touch with her family to discuss the next steps as far as surgical planning, which will likely not be for 2 to 3 weeks. -Will continue to monitor  Rhoderick Moody, MD [facility] 12/28/2019, 6:00 PM

## 2019-12-28 NOTE — Progress Notes (Signed)
PHARMACY - PHYSICIAN COMMUNICATION CRITICAL VALUE ALERT - BLOOD CULTURE IDENTIFICATION (BCID)  [name] is an 86 y.o. female who presented to [facility] on 12/27/2019 with a chief complaint of SOB, NVD and chest pain. In ED, patient presented with multi-organ failure and sepsis. CT imaging revealed mild R hydronephrosis 2/2 stone.  Assessment:   BCx - 1 bottle positive with Proteus spp.  Likely urinary source  Name of physician (or Provider) Contacted: Rhetta Mura  Current antibiotics: Zosyn  Changes to prescribed antibiotics recommended:  Patient is on appropriate antibiotics - No changes needed  Results for orders placed or performed during the hospital encounter of 12/27/19  Blood Culture ID Panel (Reflexed) (Collected: 12/27/2019  1:01 PM)  Result Value Ref Range   Enterococcus species PENDING NOT DETECTED   Listeria monocytogenes NOT DETECTED NOT DETECTED   Staphylococcus species NOT DETECTED NOT DETECTED   Staphylococcus aureus (BCID) PENDING NOT DETECTED   Streptococcus species NOT DETECTED NOT DETECTED   Streptococcus agalactiae NOT DETECTED NOT DETECTED   Streptococcus pneumoniae NOT DETECTED NOT DETECTED   Streptococcus pyogenes NOT DETECTED NOT DETECTED   Acinetobacter baumannii PENDING NOT DETECTED   Enterobacteriaceae species PENDING NOT DETECTED   Enterobacter cloacae complex NOT DETECTED NOT DETECTED   Escherichia coli NOT DETECTED NOT DETECTED   Klebsiella oxytoca NOT DETECTED NOT DETECTED   Klebsiella pneumoniae NOT DETECTED NOT DETECTED   Proteus species DETECTED (A) NOT DETECTED   Serratia marcescens NOT DETECTED NOT DETECTED   Haemophilus influenzae NOT DETECTED NOT DETECTED   Neisseria meningitidis NOT DETECTED NOT DETECTED   Pseudomonas aeruginosa NOT DETECTED NOT DETECTED   Candida albicans NOT DETECTED NOT DETECTED   Candida glabrata NOT DETECTED NOT DETECTED   Candida krusei NOT DETECTED NOT DETECTED   Candida parapsilosis NOT DETECTED NOT  DETECTED   Candida tropicalis NOT DETECTED NOT DETECTED   Margarite Gouge, PharmD PGY2 ID Pharmacy Resident [phone]  12/28/2019  3:00 PM

## 2019-12-28 NOTE — Progress Notes (Signed)
PROGRESS NOTE  [name]  ZOX:[id number] DOB: 18-Sep-1928 DOA: 12/27/2019 PCP: Donita BrooksPickard, Warren T, MD  Brief Narrative:  86 year old female [facility] CABG 1997, left-sided CVA 2009 Prior pleural effusions with redo CABG 07/06/2008 Bimodal heart failure echo 06/2015 EF 55-60% Right carotid endarterectomy 1995 Prior partial colectomy for diverticular disease Asthma HLD Atrial fibrillation CHADS2 score >4 Multiple admissions 2018, 2020 falls with fractures Right hip fracture status post repair 10/2018  Presented from facility altered mental status?  Sepsis-CT abdomen pelvis = hydronephrosis + stone right UPJ-[facility] recommended right nephrostomy  Assessment & Plan:   Principal Problem:   Severe sepsis with acute organ dysfunction due to Gram negative bacteria (HCC) Active Problems:   Essential hypertension   GERD without esophagitis   PAF (paroxysmal atrial fibrillation) (HCC)   Acute metabolic encephalopathy   Lactic acidosis   Elevated troponin level not due myocardial infarction   Mixed hyperlipidemia   Obstruction of right ureteropelvic junction (UPJ) due to stone   1. Sepsis?  Proteus with complicated urinary infection with UPJ stone a. Formal treatment per urology b. Temporizing measure nephrostomy performed 8/1 under IR c. Continue broad-spectrum Zosyn likely can de-escalate based on finalization/sensitivities d. Expect white count/procalcitonin will resolve 2. Prior diastolic heart failure in the setting of CABG X2 a. Imdur 60 on hold Bystolic 10 on hold and may need to resume once sepsis physiology resolves 3. Paroxysmal  A. Fib-heart rates moderately controlled seems to be in sinus a. Slightly hypotensive because of sepsis as above b. Holding any antihypertensives rate control at this time continue saline 4. Toxic metabolic encephalopathy a. Secondary to sepsis-unclear baseline b. Would hold possible sedating agents Lexapro 15 gabapentin 100 twice  daily c. It appears she also is on opiate but hold oxycodone 10 every 4 from PTA as well as hydrocodone 5. AKI on admission secondary to sepsis with metabolic acidosis a. Continue saline repletion monitor trends 6. Severe hypomagnesemia a. Replaced with magnesium 4 g IV piggyback b. Recheck in a.m. 7. Troponin elevation secondary to sepsis likely 8. Likely dilutional anemia from fluid resuscitation 9. BMI 18-Albumin is 1.4 she is quite cachectic 10. She is appropriately DNR  DVT prophylaxis:  Heparin per pharmacy  code Status: DNR Family Communication: called [name] [phone], no answer, left VM Disposition: Needs stepdown level of care given tenuous past  Status is: Inpatient  Remains inpatient appropriate because:Hemodynamically unstable, Persistent severe electrolyte disturbances and IV treatments appropriate due to intensity of illness or inability to take PO   Dispo: The patient is from: Home              Anticipated d/c is to: SNF              Anticipated d/c date is: 2 days              Patient currently is not medically stable to d/c.    Consultants:   Urologist Dr. Liliane ShiWinter  IR Dr. Grace IsaacWatts  Procedures: Nephrostomy tube placed 8/1  Antimicrobials: Zosyn   Subjective: Very lethargic this morning reviewed in the afternoon little bit more coherent but still quite sleepy She can tell me however she is in the hospital She complains of abdominal pain flank pain where the nephrostomy was placed  Objective: Vitals:   12/28/19 0420 12/28/19 0600 12/28/19 0630 12/28/19 0730  BP: (!) 114/51 (!) 103/46 (!) 106/42 (!) 106/50  Pulse: 86 79 79 77  Resp: (!) 28 22 (!) 25 20  Temp:  TempSrc:      SpO2: 96% 96% 94% 95%  Weight:      Height:        Intake/Output Summary (Last 24 hours) at 12/28/2019 0750 Last data filed at 12/28/2019 4627 Gross per 24 hour  Intake 2149.37 ml  Output 200 ml  Net 1949.37 ml   Filed Weights   12/27/19 1402  Weight: 48.6 kg     Examination:  General exam: Cachectic white female no cardiopulmonary distress-does not desat off of oxygen Respiratory system: Limited exam relatively clear Cardiovascular system: S1-S2 in sinus rhythm on monitors cannot appreciate murmur Gastrointestinal system: Generalized tenderness no rebound no guarding no organomegaly. Central nervous system: Intact Extremities: No lower extremity edema rash bruise ROM intact to major muscle groups Skin: As above Psychiatry: Euthymic  Data Reviewed: I have personally reviewed following labs and imaging studies CO2 16 BUN/creatinine down from 25/1.2-15/0.7 Troponin trend is flat 188-->218-->126 Magnesium 0.9 Lactic acid 4.2  Radiology Studies: DG Chest 1 View  Result Date: 12/27/2019 CLINICAL DATA:  Shortness of breath and respiratory failure EXAM: CHEST  1 VIEW COMPARISON:  April 09, 2019 FINDINGS: There is mild cardiomegaly. Aortic knob calcifications are seen. Overlying median sternotomy wires are present. Patchy airspace opacity seen within the left lung base and right lung base. There is diffusely increased interstitial markings throughout both lungs. No acute osseous abnormality. Cement fixation seen within the mid thoracic spine. IMPRESSION: Patchy interstitial airspace opacities throughout both lungs which could be due to asymmetric edema and/or infectious etiology Electronically Signed   By: Jonna Clark M.D.   On: 12/27/2019 20:45   CT ABDOMEN PELVIS W CONTRAST  Result Date: 12/27/2019 CLINICAL DATA:  Infectious gastroenteritis or colitis. EXAM: CT ABDOMEN AND PELVIS WITH CONTRAST TECHNIQUE: Multidetector CT imaging of the abdomen and pelvis was performed using the standard protocol following bolus administration of intravenous contrast. CONTRAST:  67mL OMNIPAQUE IOHEXOL 300 MG/ML  SOLN COMPARISON:  09/10/2018 FINDINGS: Lower chest: Heart is enlarged. Left base atelectasis or infiltrate. Hepatobiliary: Small area of low attenuation in  the anterior liver, adjacent to the falciform ligament, is in a characteristic location for focal fatty deposition in similar to prior. Probable tiny layering gallstones. No intrahepatic or extrahepatic biliary dilation. Pancreas: No focal mass lesion. No dilatation of the main duct. No intraparenchymal cyst. No peripancreatic edema. Spleen: No splenomegaly. No focal mass lesion. Adrenals/Urinary Tract: No adrenal nodule or mass. 2 x 4 mm nonobstructing stone identified posterior interpolar right kidney. A second tiny 2 mm posterior interpolar right renal stone evident. There is mild right hydronephrosis secondary to the presence of a linear 3 x 6 x 12 mm stone at or just distal to the right UPJ. Urothelial enhancement around the stone is likely secondary to irritation. There is some fullness in the right ureter below this stone but no additional right ureteral stone evident. There is a phlebolith in the right pelvis seen to be below the ureter on coronal image 47 of series 7. Clustered stones likely with staghorn component identified in the left renal pelvis extending into upper and lower pole collecting systems. No substantial hydronephrosis. No left hydroureter. There is irregular circumferential bladder wall thickening with possible left superolateral bladder diverticulum on coronal image 40 of series 7. Stomach/Bowel: Small hiatal hernia. Duodenum is normally positioned as is the ligament of Treitz. No small bowel wall thickening. No small bowel dilatation. The terminal ileum is normal. No gross colonic mass. No colonic wall thickening. Diverticular changes are noted in  the left colon without evidence of diverticulitis. Mild wall thickening in the mid sigmoid colon may be related to muscular hypertrophy from the diverticulosis. No features of diverticulitis. Vascular/Lymphatic: There is abdominal aortic atherosclerosis without aneurysm. There is no gastrohepatic or hepatoduodenal ligament lymphadenopathy. No  retroperitoneal or mesenteric lymphadenopathy. No pelvic sidewall lymphadenopathy. Reproductive: Fluid and gas identified in the right vaginal fornix (image 72/series 3). No obvious colovaginal fistula. Insert no adnexal mass Other: No intraperitoneal free fluid. Musculoskeletal: Bones are diffusely demineralized. Status post right hip replacement.L1 superior endplate compression fracture noted. IMPRESSION: 1. Mild right hydronephrosis secondary to the presence of a linear 3 x 6 x 12 mm stone at or just distal to the right UPJ. Urothelial enhancement around the stone is likely secondary to irritation. 2. Clustered stones likely with staghorn component identified in the left renal pelvis extending into upper and lower pole collecting systems. No substantial hydronephrosis. 3. Irregular circumferential bladder wall thickening with possible left superolateral bladder diverticulum. Bladder infection not excluded. 4. Fluid and gas identified in the right vaginal fornix. No evidence for colovaginal fistula. Indeterminate finding. Patient did have some gas in the vaginal vault on previous CT from 09/10/2018. 5. Small hiatal hernia. 6. Left base atelectasis or infiltrate. 7. L1 superior endplate compression fracture. 8. Aortic Atherosclerosis (ICD10-I70.0). Electronically Signed   By: Kennith Center M.D.   On: 12/27/2019 17:13   DG Abd Acute W/Chest  Result Date: 12/27/2019 CLINICAL DATA:  Shortness of breath, COVID positive, nausea and vomiting. EXAM: DG ABDOMEN ACUTE W/ 1V CHEST COMPARISON:  Chest x-ray dated 04/09/2019. Abdominal x-ray dated 12/30/2013. FINDINGS: Heart size and mediastinal contours are stable. Lungs are clear. Lungs are hyperexpanded. No pleural effusion or pneumothorax is seen. Old healed rib fractures on the LEFT. Overall bowel gas pattern is nonobstructive. No evidence of soft tissue mass or abnormal fluid collection. No evidence of free intraperitoneal air. Extensive aortic atherosclerosis.  Degenerative spondylosis of the slightly scoliotic thoracolumbar spine, moderate in degree. No acute appearing osseous abnormality. IMPRESSION: 1. No active cardiopulmonary disease. No evidence of pneumonia or pulmonary edema. 2. COPD. 3. No acute findings within the abdomen or pelvis. Nonobstructive bowel gas pattern. Electronically Signed   By: Bary Richard M.D.   On: 12/27/2019 13:51   IR NEPHROSTOMY PLACEMENT RIGHT  Result Date: 12/27/2019 INDICATION: Urinary sepsis secondary to obstructing stone at the level of the right UPJ/superior ureter. Request made for placement of a right-sided percutaneous nephrostomy catheter for infection source control purposes. EXAM: 1. ULTRASOUND GUIDANCE FOR PUNCTURE OF THE LEFT/RIGHT RENAL COLLECTING SYSTEM 2. RIGHT PERCUTANEOUS NEPHROSTOMY TUBE PLACEMENT. COMPARISON:  CT abdomen pelvis-12/27/2019 MEDICATIONS: Patient has received multiple intravenous antibiotics while in the emergency department.; The antibiotic was administered in an appropriate time frame prior to skin puncture. ANESTHESIA/SEDATION: Moderate (conscious) sedation was employed during this procedure. A total of Versed 0.5 mg and Fentanyl 25 mcg was administered intravenously. Moderate Sedation Time: 17 minutes. The patient's level of consciousness and vital signs were monitored continuously by radiology nursing throughout the procedure under my direct supervision. CONTRAST:  10 cc mL Isovue 300 - administered into the renal collecting system FLUOROSCOPY TIME:  3 minutes, 24 seconds (22 mGy) COMPLICATIONS: None immediate. PROCEDURE: The procedure, risks, benefits, and alternatives were explained to the patient. Questions regarding the procedure were encouraged and answered. The patient understands and consents to the procedure. A timeout was performed prior to the initiation of the procedure. The right flank region was prepped and draped in the usual sterile  fashion and a sterile drape was applied covering the  operative field. A sterile gown and sterile gloves were used for the procedure. Local anesthesia was provided with 1% Lidocaine with epinephrine. Ultrasound was used to localize the right kidney. Attempts were made to access a posterior calyx with ultrasound guidance however this proved difficult secondary to angulation of kidney as well as patient's positioning on the fluoroscopy table. Fluoroscopic imaging demonstrated contrast from preceding abdominal CT within the right renal collecting system. As such, a posteroinferior calyx was targeted fluoroscopically with a 20 gauge needle. Access to the collecting system was confirmed with advancement of Nitrex wire to the level the right renal pelvis. Over a Nitrex wire, the tract was dilated with an Accustick stent. Next, under intermittent fluoroscopic guidance and over a short Amplatz wire, the track was dilated ultimately allowing placement of a 10-French percutaneous nephrostomy catheter which was advanced to the level of the renal pelvis where the coil was formed and locked. Contrast was injected and several spot fluoroscopic images were obtained in various obliquities. The catheter was secured at the skin with a Prolene retention suture and stat lock device and connected to a gravity bag was placed. Dressings were applied. The patient tolerated procedure well without immediate postprocedural complication. FINDINGS: Ultrasound scanning demonstrates a mildly dilated right renal collecting system. Under fluoroscopic guidance, a contrast opacified posteroinferior calyx was targeted allowing placement of a 10-French percutaneous nephrostomy catheter with end coiled and locked within the renal pelvis. Contrast injection confirmed appropriate positioning. IMPRESSION: Successful ultrasound and fluoroscopic guided placement of a right sided 10 French PCN. Electronically Signed   By: Simonne Come M.D.   On: 12/27/2019 21:18     Scheduled Meds: . heparin injection  (subcutaneous)  5,000 Units Subcutaneous Q8H  . pantoprazole (PROTONIX) IV  40 mg Intravenous Q24H  . sodium chloride flush  5 mL Intracatheter Q8H   Continuous Infusions: . lactated ringers 100 mL/hr at 12/28/19 0603  . magnesium sulfate bolus IVPB    . piperacillin-tazobactam (ZOSYN)  IV 3.375 g (12/28/19 0449)     LOS: 1 day    Time spent: 31  Rhetta Mura, MD [facility] To contact the attending provider between 7A-7P or the covering provider during after hours 7P-7A, please log into the web site [email or web address] and access using universal [place] password for that web site. If you do not have the password, please call the hospital operator.  12/28/2019, 7:50 AM

## 2019-12-28 NOTE — ED Notes (Signed)
Patient's niece, Claris Che, updated on patient's status via telephone.

## 2019-12-28 NOTE — Progress Notes (Signed)
Spoke with patient's niece [name] with POC updates. Pt resting comfortably VSS no distress noted. No needs voiced at this time.

## 2019-12-29 LAB — CBC WITH DIFFERENTIAL/PLATELET
Abs Immature Granulocytes: 0.21 10*3/uL — ABNORMAL HIGH (ref 0.00–0.07)
Basophils Absolute: 0.1 10*3/uL (ref 0.0–0.1)
Basophils Relative: 0 %
Eosinophils Absolute: 0.2 10*3/uL (ref 0.0–0.5)
Eosinophils Relative: 1 %
HCT: 30.1 % — ABNORMAL LOW (ref 36.0–46.0)
Hemoglobin: 9.3 g/dL — ABNORMAL LOW (ref 12.0–15.0)
Immature Granulocytes: 1 %
Lymphocytes Relative: 9 %
Lymphs Abs: 1.7 10*3/uL (ref 0.7–4.0)
MCH: 29.3 pg (ref 26.0–34.0)
MCHC: 30.9 g/dL (ref 30.0–36.0)
MCV: 95 fL (ref 80.0–100.0)
Monocytes Absolute: 0.9 10*3/uL (ref 0.1–1.0)
Monocytes Relative: 5 %
Neutro Abs: 16.9 10*3/uL — ABNORMAL HIGH (ref 1.7–7.7)
Neutrophils Relative %: 84 %
Platelets: 204 10*3/uL (ref 150–400)
RBC: 3.17 MIL/uL — ABNORMAL LOW (ref 3.87–5.11)
RDW: 13.9 % (ref 11.5–15.5)
WBC: 19.9 10*3/uL — ABNORMAL HIGH (ref 4.0–10.5)
nRBC: 0 % (ref 0.0–0.2)

## 2019-12-29 LAB — BASIC METABOLIC PANEL
Anion gap: 13 (ref 5–15)
BUN: 23 mg/dL (ref 8–23)
CO2: 22 mmol/L (ref 22–32)
Calcium: 8.5 mg/dL — ABNORMAL LOW (ref 8.9–10.3)
Chloride: 105 mmol/L (ref 98–111)
Creatinine, Ser: 1.12 mg/dL — ABNORMAL HIGH (ref 0.44–1.00)
GFR calc Af Amer: 51 mL/min — ABNORMAL LOW (ref >60)
GFR calc non Af Amer: 44 mL/min — ABNORMAL LOW (ref >60)
Glucose, Bld: 74 mg/dL (ref 70–99)
Potassium: 3.7 mmol/L (ref 3.5–5.1)
Sodium: 140 mmol/L (ref 135–145)

## 2019-12-29 LAB — BLOOD CULTURE ID PANEL (REFLEXED)
Acinetobacter baumannii: NOT DETECTED
Candida albicans: NOT DETECTED
Candida glabrata: NOT DETECTED
Candida krusei: NOT DETECTED
Candida parapsilosis: NOT DETECTED
Candida tropicalis: NOT DETECTED
Enterobacter cloacae complex: NOT DETECTED
Enterobacteriaceae species: DETECTED — AB
Enterococcus species: NOT DETECTED
Escherichia coli: NOT DETECTED
Haemophilus influenzae: NOT DETECTED
Klebsiella oxytoca: NOT DETECTED
Klebsiella pneumoniae: NOT DETECTED
Listeria monocytogenes: NOT DETECTED
Neisseria meningitidis: NOT DETECTED
Proteus species: DETECTED — AB
Pseudomonas aeruginosa: NOT DETECTED
Serratia marcescens: NOT DETECTED
Staphylococcus aureus (BCID): NOT DETECTED
Staphylococcus species: NOT DETECTED
Streptococcus agalactiae: NOT DETECTED
Streptococcus pneumoniae: NOT DETECTED
Streptococcus pyogenes: NOT DETECTED
Streptococcus species: NOT DETECTED

## 2019-12-29 LAB — MAGNESIUM: Magnesium: 2.3 mg/dL (ref 1.7–2.4)

## 2019-12-29 MED ORDER — SODIUM CHLORIDE 0.9 % IV SOLN: INTRAVENOUS | Status: AC

## 2019-12-29 MED ORDER — METOPROLOL TARTRATE 25 MG PO TABS
25.0000 mg | ORAL_TABLET | Freq: Four times a day (QID) | ORAL | Status: DC
  Administered 2019-12-30: 25 mg via ORAL
  Filled 2019-12-29: qty 1

## 2019-12-29 MED ORDER — AMIODARONE HCL IN DEXTROSE 360-4.14 MG/200ML-% IV SOLN
30.0000 mg/h | INTRAVENOUS | Status: DC
  Administered 2019-12-29 – 2019-12-30 (×2): 30 mg/h via INTRAVENOUS
  Filled 2019-12-29 (×2): qty 200

## 2019-12-29 MED ORDER — AMIODARONE HCL IN DEXTROSE 360-4.14 MG/200ML-% IV SOLN
60.0000 mg/h | INTRAVENOUS | Status: AC
  Administered 2019-12-29: 60 mg/h via INTRAVENOUS
  Filled 2019-12-29: qty 200

## 2019-12-29 MED ORDER — ADULT MULTIVITAMIN W/MINERALS CH
1.0000 | ORAL_TABLET | Freq: Every day | ORAL | Status: DC
  Administered 2019-12-29 – 2020-01-02 (×5): 1 via ORAL
  Filled 2019-12-29 (×5): qty 1

## 2019-12-29 MED ORDER — ENSURE ENLIVE PO LIQD
237.0000 mL | Freq: Two times a day (BID) | ORAL | Status: DC
  Administered 2019-12-29 – 2020-01-01 (×6): 237 mL via ORAL

## 2019-12-29 MED ORDER — NEBIVOLOL HCL 5 MG PO TABS
10.0000 mg | ORAL_TABLET | Freq: Every day | ORAL | Status: DC
  Administered 2019-12-29: 10 mg via ORAL
  Filled 2019-12-29 (×2): qty 2

## 2019-12-29 MED ORDER — SODIUM CHLORIDE 0.9 % IV SOLN: INTRAVENOUS | Status: DC

## 2019-12-29 NOTE — Progress Notes (Signed)
Referring Physician(s): Winter,C  Supervising Physician: Gilmer Mor  Patient Status:  [facility] - In-pt  Chief Complaint: Right flank pain   Subjective: Pt states she is thirsty; has some mild rt flank discomfort; denies n/v   Allergies: Metoprolol, Phenergan [promethazine hcl], Prochlorperazine edisylate, Zetia [ezetimibe], Compazine [prochlorperazine], Sulfur, Niacin, and Sulfonamide derivatives  Medications: Prior to Admission medications   Medication Sig Start Date End Date Taking? Authorizing Provider  aspirin EC 81 MG tablet Take 1 tablet (81 mg total) by mouth daily with breakfast. 09/11/18  Yes Emokpae, Courage, MD  Calcium Carbonate-Vitamin D (OYSTER SHELL CALCIUM 500 + D PO) Take 1 tablet by mouth in the morning and at bedtime.   Yes [provider]  Cholecalciferol (VITAMIN D3) 1.25 MG (50000 UT) TABS Take 1 tablet by mouth daily.   Yes [provider]  docusate sodium (COLACE) 100 MG capsule Take 1 capsule (100 mg total) by mouth 2 (two) times daily. 11/21/18  Yes Swayze, Ava, DO  escitalopram (LEXAPRO) 10 MG tablet Take 15 mg by mouth daily.   Yes [provider]  esomeprazole (NEXIUM) 20 MG capsule Take 20 mg by mouth daily at 12 noon.    Yes [provider]  gabapentin (NEURONTIN) 100 MG capsule Take 100 mg by mouth 2 (two) times daily.   Yes [provider]  guaiFENesin (MUCINEX) 600 MG 12 hr tablet Take 600 mg by mouth 2 (two) times daily.   Yes [provider]  isosorbide mononitrate (IMDUR) 30 MG 24 hr tablet Take 30 mg by mouth daily.   Yes [provider]  isosorbide mononitrate (IMDUR) 60 MG 24 hr tablet TAKE 1 TABLET BY MOUTH DAILY. PLEASE MAKE YEARLY APPT WITH DR. Elease Hashimoto BEFORE ANYMORE REFILLS Patient taking differently: Take 60 mg by mouth daily.  12/01/18  Yes Nahser, Deloris Ping, MD  lidocaine (LIDODERM) 5 % Place 1 patch onto the skin 2 (two) times daily. Remove & Discard patch within 12 hours or as  directed by MD   Yes [provider]  melatonin 5 MG TABS Take 5 mg by mouth at bedtime.   Yes [provider]  nebivolol (BYSTOLIC) 10 MG tablet Take 1 tablet (10 mg total) by mouth daily. Hold for SBP < 110 mmhg and HR < 60 BPM 09/12/18  Yes Emokpae, Courage, MD  nitroGLYCERIN (NITROSTAT) 0.4 MG SL tablet Place 1 tablet (0.4 mg total) under the tongue every 5 (five) minutes as needed for chest pain. 10/26/17  Yes Devoria Albe, MD  oxyCODONE (OXY IR/ROXICODONE) 5 MG immediate release tablet Take 10 mg by mouth every 4 (four) hours as needed for severe pain.   Yes [provider]  potassium chloride (MICRO-K) 10 MEQ CR capsule Take 1 capsule (10 mEq total) by mouth daily. 09/11/18  Yes Emokpae, Courage, MD  rosuvastatin (CRESTOR) 20 MG tablet Take 1 tablet (20 mg total) by mouth daily. Pt needs to keep appt in Oct for further refills 02/09/19  Yes Nahser, Deloris Ping, MD  acetaminophen (TYLENOL) 325 MG tablet Take 2 tablets (650 mg total) by mouth every 6 (six) hours as needed for mild pain, fever or headache (or Fever >/= 101). Patient not taking: Reported on 12/27/2019 09/11/18   Shon Hale, MD  HYDROcodone-acetaminophen (NORCO/VICODIN) 5-325 MG tablet Take 1-2 tablets by mouth every 4 (four) hours as needed. Patient not taking: Reported on 12/27/2019 04/09/19   Lorre Nick, MD     Vital Signs: BP (!) 102/53 (BP Location: Left  Arm)   Pulse 81   Temp 97.6 F (36.4 C) (Oral)   Resp 20   Ht 5\' 4"  (1.626 m)   Wt 107 lb 3.2 oz (48.6 kg)   SpO2 98%   BMI 18.40 kg/m   Physical Exam awake,answers simple questions; rt PCN intact, dressing dry, OP 730 cc blood-tinged urine  Imaging: DG Chest 1 View  Result Date: 12/27/2019 CLINICAL DATA:  Shortness of breath and respiratory failure EXAM: CHEST  1 VIEW COMPARISON:  April 09, 2019 FINDINGS: There is mild cardiomegaly. Aortic knob calcifications are seen. Overlying median sternotomy wires are present. Patchy airspace  opacity seen within the left lung base and right lung base. There is diffusely increased interstitial markings throughout both lungs. No acute osseous abnormality. Cement fixation seen within the mid thoracic spine. IMPRESSION: Patchy interstitial airspace opacities throughout both lungs which could be due to asymmetric edema and/or infectious etiology Electronically Signed   By: April 11, 2019 M.D.   On: 12/27/2019 20:45   CT ABDOMEN PELVIS W CONTRAST  Result Date: 12/27/2019 CLINICAL DATA:  Infectious gastroenteritis or colitis. EXAM: CT ABDOMEN AND PELVIS WITH CONTRAST TECHNIQUE: Multidetector CT imaging of the abdomen and pelvis was performed using the standard protocol following bolus administration of intravenous contrast. CONTRAST:  73mL OMNIPAQUE IOHEXOL 300 MG/ML  SOLN COMPARISON:  09/10/2018 FINDINGS: Lower chest: Heart is enlarged. Left base atelectasis or infiltrate. Hepatobiliary: Small area of low attenuation in the anterior liver, adjacent to the falciform ligament, is in a characteristic location for focal fatty deposition in similar to prior. Probable tiny layering gallstones. No intrahepatic or extrahepatic biliary dilation. Pancreas: No focal mass lesion. No dilatation of the main duct. No intraparenchymal cyst. No peripancreatic edema. Spleen: No splenomegaly. No focal mass lesion. Adrenals/Urinary Tract: No adrenal nodule or mass. 2 x 4 mm nonobstructing stone identified posterior interpolar right kidney. A second tiny 2 mm posterior interpolar right renal stone evident. There is mild right hydronephrosis secondary to the presence of a linear 3 x 6 x 12 mm stone at or just distal to the right UPJ. Urothelial enhancement around the stone is likely secondary to irritation. There is some fullness in the right ureter below this stone but no additional right ureteral stone evident. There is a phlebolith in the right pelvis seen to be below the ureter on coronal image 47 of series 7. Clustered stones  likely with staghorn component identified in the left renal pelvis extending into upper and lower pole collecting systems. No substantial hydronephrosis. No left hydroureter. There is irregular circumferential bladder wall thickening with possible left superolateral bladder diverticulum on coronal image 40 of series 7. Stomach/Bowel: Small hiatal hernia. Duodenum is normally positioned as is the ligament of Treitz. No small bowel wall thickening. No small bowel dilatation. The terminal ileum is normal. No gross colonic mass. No colonic wall thickening. Diverticular changes are noted in the left colon without evidence of diverticulitis. Mild wall thickening in the mid sigmoid colon may be related to muscular hypertrophy from the diverticulosis. No features of diverticulitis. Vascular/Lymphatic: There is abdominal aortic atherosclerosis without aneurysm. There is no gastrohepatic or hepatoduodenal ligament lymphadenopathy. No retroperitoneal or mesenteric lymphadenopathy. No pelvic sidewall lymphadenopathy. Reproductive: Fluid and gas identified in the right vaginal fornix (image 72/series 3). No obvious colovaginal fistula. Insert no adnexal mass Other: No intraperitoneal free fluid. Musculoskeletal: Bones are diffusely demineralized. Status post right hip replacement.L1 superior endplate compression fracture noted. IMPRESSION: 1. Mild right hydronephrosis secondary to the presence of a  linear 3 x 6 x 12 mm stone at or just distal to the right UPJ. Urothelial enhancement around the stone is likely secondary to irritation. 2. Clustered stones likely with staghorn component identified in the left renal pelvis extending into upper and lower pole collecting systems. No substantial hydronephrosis. 3. Irregular circumferential bladder wall thickening with possible left superolateral bladder diverticulum. Bladder infection not excluded. 4. Fluid and gas identified in the right vaginal fornix. No evidence for colovaginal  fistula. Indeterminate finding. Patient did have some gas in the vaginal vault on previous CT from 09/10/2018. 5. Small hiatal hernia. 6. Left base atelectasis or infiltrate. 7. L1 superior endplate compression fracture. 8. Aortic Atherosclerosis (ICD10-I70.0). Electronically Signed   By: Kennith Center M.D.   On: 12/27/2019 17:13   DG Abd Acute W/Chest  Result Date: 12/27/2019 CLINICAL DATA:  Shortness of breath, COVID positive, nausea and vomiting. EXAM: DG ABDOMEN ACUTE W/ 1V CHEST COMPARISON:  Chest x-ray dated 04/09/2019. Abdominal x-ray dated 12/30/2013. FINDINGS: Heart size and mediastinal contours are stable. Lungs are clear. Lungs are hyperexpanded. No pleural effusion or pneumothorax is seen. Old healed rib fractures on the LEFT. Overall bowel gas pattern is nonobstructive. No evidence of soft tissue mass or abnormal fluid collection. No evidence of free intraperitoneal air. Extensive aortic atherosclerosis. Degenerative spondylosis of the slightly scoliotic thoracolumbar spine, moderate in degree. No acute appearing osseous abnormality. IMPRESSION: 1. No active cardiopulmonary disease. No evidence of pneumonia or pulmonary edema. 2. COPD. 3. No acute findings within the abdomen or pelvis. Nonobstructive bowel gas pattern. Electronically Signed   By: Bary Richard M.D.   On: 12/27/2019 13:51   ECHOCARDIOGRAM COMPLETE  Result Date: 12/28/2019    ECHOCARDIOGRAM REPORT   Patient Name:   [name] Date of Exam: 12/28/2019 Medical Rec #:  [id number]   Height:       64.0 in Accession #:    [id number]  Weight:       107.2 lb Date of Birth:  [birth year]   BSA:          1.501 m Patient Age:    86 years    BP:           112/45 mmHg Patient Gender: F           HR:           80 bpm. Exam Location:  Inpatient Procedure: 2D Echo Indications:    428.31; I50.31 Acute diastolic (congestive) heart failure  History:        Patient has prior history of Echocardiogram examinations, most                 recent 07/20/2015.  CHF, CAD, Prior CABG, Stroke; Risk                 Factors:Hypertension and Dyslipidemia.  Sonographer:    Celene Skeen RDCS (AE) Referring Phys: [id number] Deno Lunger [facility]  Sonographer Comments: No apical window. restricted mobility IMPRESSIONS  1. No apical images obtained to evaluate diastolic function. This study is limited for diagnostic assessment.  2. Left ventricular ejection fraction, by estimation, is 65 to 70%. The left ventricle has normal function. Left ventricular endocardial border not optimally defined to evaluate regional wall motion. There is mild asymmetric left ventricular hypertrophy  of the septal segment. Left ventricular diastolic function could not be evaluated.  3. Right ventricular systolic function is normal. The right ventricular size is normal. There is mildly elevated pulmonary artery systolic pressure. The  estimated right ventricular systolic pressure is 43.1 mmHg.  4. The mitral valve is degenerative. Trivial mitral valve regurgitation.  5. The tricuspid valve is degenerative. Tricuspid valve regurgitation is mild to moderate.  6. The aortic valve is abnormal. AV is incompletely assessed. FINDINGS  Left Ventricle: Left ventricular ejection fraction, by estimation, is 65 to 70%. The left ventricle has normal function. Left ventricular endocardial border not optimally defined to evaluate regional wall motion. The left ventricular internal cavity size was normal in size. There is mild asymmetric left ventricular hypertrophy of the septal segment. Left ventricular diastolic function could not be evaluated. Right Ventricle: The right ventricular size is normal. Right vetricular wall thickness was not assessed. Right ventricular systolic function is normal. There is mildly elevated pulmonary artery systolic pressure. The tricuspid regurgitant velocity is 2.65 m/s, and with an assumed right atrial pressure of 15 mmHg, the estimated right ventricular systolic pressure is 43.1 mmHg. Left  Atrium: Left atrial size was not assessed. Right Atrium: Right atrial size was not assessed. Pericardium: There is no evidence of pericardial effusion. Mitral Valve: The mitral valve is degenerative in appearance. Moderate mitral annular calcification. Trivial mitral valve regurgitation. Tricuspid Valve: The tricuspid valve is degenerative in appearance. Tricuspid valve regurgitation is mild to moderate. Aortic Valve: The aortic valve is abnormal. Aortic valve regurgitation is not visualized. There is moderate calcification of the aortic valve. Pulmonic Valve: The pulmonic valve was not well visualized. Pulmonic valve regurgitation is not visualized. Aorta: The aortic root is normal in size and structure. IAS/Shunts: The interatrial septum was not well visualized.  LEFT VENTRICLE PLAX 2D LVIDd:         3.10 cm LVIDs:         1.90 cm LV PW:         1.00 cm LV IVS:        1.20 cm LVOT diam:     2.00 cm LVOT Area:     3.14 cm  LEFT ATRIUM         Index LA diam:    3.40 cm 2.27 cm/m   AORTA Ao Root diam: 2.60 cm TRICUSPID VALVE TR Peak grad:   28.1 mmHg TR Vmax:        265.00 cm/s  SHUNTS Systemic Diam: 2.00 cm Weston Brass MD Electronically signed by Weston Brass MD Signature Date/Time: 12/28/2019/5:40:27 PM    Final    IR NEPHROSTOMY PLACEMENT RIGHT  Result Date: 12/27/2019 INDICATION: Urinary sepsis secondary to obstructing stone at the level of the right UPJ/superior ureter. Request made for placement of a right-sided percutaneous nephrostomy catheter for infection source control purposes. EXAM: 1. ULTRASOUND GUIDANCE FOR PUNCTURE OF THE LEFT/RIGHT RENAL COLLECTING SYSTEM 2. RIGHT PERCUTANEOUS NEPHROSTOMY TUBE PLACEMENT. COMPARISON:  CT abdomen pelvis-12/27/2019 MEDICATIONS: Patient has received multiple intravenous antibiotics while in the emergency department.; The antibiotic was administered in an appropriate time frame prior to skin puncture. ANESTHESIA/SEDATION: Moderate (conscious) sedation was  employed during this procedure. A total of Versed 0.5 mg and Fentanyl 25 mcg was administered intravenously. Moderate Sedation Time: 17 minutes. The patient's level of consciousness and vital signs were monitored continuously by radiology nursing throughout the procedure under my direct supervision. CONTRAST:  10 cc mL Isovue 300 - administered into the renal collecting system FLUOROSCOPY TIME:  3 minutes, 24 seconds (22 mGy) COMPLICATIONS: None immediate. PROCEDURE: The procedure, risks, benefits, and alternatives were explained to the patient. Questions regarding the procedure were encouraged and answered. The patient understands and consents to  the procedure. A timeout was performed prior to the initiation of the procedure. The right flank region was prepped and draped in the usual sterile fashion and a sterile drape was applied covering the operative field. A sterile gown and sterile gloves were used for the procedure. Local anesthesia was provided with 1% Lidocaine with epinephrine. Ultrasound was used to localize the right kidney. Attempts were made to access a posterior calyx with ultrasound guidance however this proved difficult secondary to angulation of kidney as well as patient's positioning on the fluoroscopy table. Fluoroscopic imaging demonstrated contrast from preceding abdominal CT within the right renal collecting system. As such, a posteroinferior calyx was targeted fluoroscopically with a 20 gauge needle. Access to the collecting system was confirmed with advancement of Nitrex wire to the level the right renal pelvis. Over a Nitrex wire, the tract was dilated with an Accustick stent. Next, under intermittent fluoroscopic guidance and over a short Amplatz wire, the track was dilated ultimately allowing placement of a 10-French percutaneous nephrostomy catheter which was advanced to the level of the renal pelvis where the coil was formed and locked. Contrast was injected and several spot  fluoroscopic images were obtained in various obliquities. The catheter was secured at the skin with a Prolene retention suture and stat lock device and connected to a gravity bag was placed. Dressings were applied. The patient tolerated procedure well without immediate postprocedural complication. FINDINGS: Ultrasound scanning demonstrates a mildly dilated right renal collecting system. Under fluoroscopic guidance, a contrast opacified posteroinferior calyx was targeted allowing placement of a 10-French percutaneous nephrostomy catheter with end coiled and locked within the renal pelvis. Contrast injection confirmed appropriate positioning. IMPRESSION: Successful ultrasound and fluoroscopic guided placement of a right sided 10 French PCN. Electronically Signed   By: Simonne Come M.D.   On: 12/27/2019 21:18    Labs:  CBC: Recent Labs    04/09/19 0813 04/09/19 0813 12/27/19 1301 12/27/19 2009 12/28/19 0421 12/29/19 0207  WBC 10.8*  --  21.7*  --  25.3* 19.9*  HGB 12.9   < > 11.8* 10.9* 8.6* 9.3*  HCT 40.1   < > 38.0 32.0* 28.9* 30.1*  PLT 265  --  304  --  188 204   < > = values in this interval not displayed.    COAGS: Recent Labs    12/28/19 0421  APTT 42*    BMP: Recent Labs    04/09/19 0813 04/09/19 0813 12/27/19 1301 12/27/19 2009 12/28/19 0421 12/29/19 0207  NA 136   < > 140 139 137 140  K 4.1   < > 3.8 4.1 4.2 3.7  CL 101  --  101  --  106 105  CO2 26  --  25  --  16* 22  GLUCOSE 105*  --  142*  --  72 74  BUN 12  --  24*  --  15 23  CALCIUM 9.5  --  9.6  --  7.3* 8.5*  CREATININE 0.80  --  1.25*  --  0.73 1.12*  GFRNONAA >60  --  38*  --  >60 44*  GFRAA >60  --  44*  --  >60 51*   < > = values in this interval not displayed.    LIVER FUNCTION TESTS: Recent Labs    12/27/19 1301 12/28/19 0421  BILITOT 1.2 0.5  AST 111* 28  ALT 61* 23  ALKPHOS 112 48  PROT 6.2* 3.3*  ALBUMIN 2.9* 1.4*    Assessment  and Plan: Pt with hx urosepsis secondary to obst  stone rt UPJ/superior ureter; s/p rt PCN 8/1; afebrile; WBC 19.9(25.3), hgb stable; creat 1.12, urine cx- mult species, blood cx- proteus; antbx per primary team; cont PCN- further plans as per urology   Electronically Signed: D. Jeananne RamaKevin Xoe Hoe, PA-C 12/29/2019, 11:33 AM   I spent a total of 15 minutes at the the patient's bedside AND on the patient's hospital floor or unit, greater than 50% of which was counseling/coordinating care for right nephrostomy    Patient ID: Joycelyn DasLou B Gabler, female   DOB: May 08, 1929, 86 y.o.   MRN: [id number]

## 2019-12-29 NOTE — Progress Notes (Signed)
Atrial fibrillation is uncontrolled and her pressure is low reviewed extensive notes from past medical history and it looks like the next step would have been amiodarone based on general surgery note in the past when she was admitted and had paroxysmal A. fib Amiodarone infusion started-cardiology consulted to evaluate

## 2019-12-29 NOTE — Progress Notes (Signed)
Subsequent long discussion [place] She confirms DNR state I have mentioned patient has multiple comorbidities and is improving but remains somewhat tenuous with tachycardia, however may need definitive surgery in the next several weeks per report from Dr. Liliane Shi Nursing/family member aware to coordinate with Dr. Liliane Shi further discussions regarding definitive management  Pleas Koch, MD [facility] 10:13 AM

## 2019-12-29 NOTE — Consult Note (Addendum)
Cardiology Consultation:   Patient ID: SOBIA KARGER; [id number]; 05-Jan-1929   Admit date: 12/27/2019 Date of Consult: 12/29/2019  Primary Care Provider: Donita Brooks, MD Primary Cardiologist: Kristeen Miss, MD Primary Electrophysiologist:  None   Patient Profile:   [name] is a 86 y.o. female with a PMH of CAD s/p CABG 1997 with redo in 2010, paroxysmal atrial fibrillation, ICM with improvement in EF, HTN, HLD, Carotid artery disease s/p right CEA 1995, Asthma, CVA 2009, and frequent falls, who is being seen today for the evaluation of atrial fibrillation at the request of Dr. Mahala Menghini.  History of Present Illness:   [name] presented to the ED from her skilled nursing facility with several days of multiple complaints including SOB, N/V/D, and chest pain. At the time of her arrival she was minimally responsive and unable to participate in history taking. She was admitted to medicine for sepsis, meeting multiple SIRS criteria likely 2/2 a UTI. She was found to have mild righ hydronephrosis 2/2 ureteral stone. Urology was consulted and she was taken to IR for right nephrostomy tube placement 12/27/19. She has been maintained on IV antibiotics. Her home BB was initially held due to hypotension in the setting of sepsis and restarted this morning after the patient went atrial fibrillation. Rate control has been limited by hypotension, therefore cardiology was asked to weigh in on Afib management.   She was last evaluated by cardiology at an outpatient visit with Dr. Elease Hashimoto 10/2017, at which time she reported a recent fall resulting in rib fractures, and ongoing chronic SOB which was unchanged. No medication changes occurred at that visit and she was recommended to follow-up in 6 months, however was lost to follow-up. Her last echocardiogram prior to this admission in 2017 showed EF 55-60%, G1DD, no RWMA, moderate TR, and moderately elevated PA pressures. Her last ischemic evaluation was a NST in  2016 which was without ischemia.   [name] is not currently aware of the atrial fibrillation.  She sometimes will feel her heart flip or flutter, but is not feeling this now.  She has not lost consciousness.  She is not having any chest pain.  She is not short of breath.  She is extremely weak and sleepy and tired.  She is not able to participate very much in the physical exam or interview.  She currently complains of pain in her right lower quadrant.   Past Medical History:  Diagnosis Date  . Allergy   . Asthma   . CAD (coronary artery disease)    s/p CABG x 5 in 1997 & CABG x 3 in 2010; Last cath in April of 2010 showed grafts to be patent. EF is 40 to 45%  . CHF (congestive heart failure) (HCC)    EF is 40 to 45%, 06/2015 EF 55-60%  . Depression    following bypass surgery  . Diverticulosis   . Endometriosis   . GERD (gastroesophageal reflux disease)   . Hepatitis A   . Hiatal hernia   . History of cerebrovascular disease   . History of vertebral fracture    at T8  . Hyperlipemia   . Hypertension   . LBP (low back pain)   . Mixed incontinence urge and stress 04/22/2013  . Nocturia 04/22/2013  . Pleural effusion    post surgical  . PVD (peripheral vascular disease) (HCC)    S/P right CEA  . Sleep apnea   . Stroke [facility]) 2010  Past Surgical History:  Procedure Laterality Date  . APPENDECTOMY    . CARDIAC CATHETERIZATION  April 2010   Grafts were patent. EF is 40 to 45%  . CAROTID ENDARTERECTOMY     right  . CORONARY ARTERY BYPASS GRAFT  [id number]   Original surgery in 1997 x 5; Redo CABG x 3 in 2010 including free RIMA to distal LAD, SVG to OM and SVG to left posterolateral  . INTRAMEDULLARY (IM) NAIL INTERTROCHANTERIC Right 11/19/2018   Procedure: INTRAMEDULLARY (IM) NAIL INTERTROCHANTRIC;  Surgeon: Yolonda Kida, MD;  Location: [facility] OR;  Service: Orthopedics;  Laterality: Right;  . IR NEPHROSTOMY PLACEMENT RIGHT  12/27/2019  . KNEE SURGERY     left  . LEFT  HEART CATHETERIZATION WITH CORONARY/GRAFT ANGIOGRAM N/A 03/12/2013   Procedure: LEFT HEART CATHETERIZATION WITH Isabel Caprice;  Surgeon: Peter M Swaziland, MD;  Location: [facility] CATH LAB;  Service: Cardiovascular;  Laterality: N/A;  . PARTIAL COLECTOMY     due to colon abcess     Home Medications:  Prior to Admission medications   Medication Sig Start Date End Date Taking? Authorizing Provider  aspirin EC 81 MG tablet Take 1 tablet (81 mg total) by mouth daily with breakfast. 09/11/18  Yes Emokpae, Courage, MD  Calcium Carbonate-Vitamin D (OYSTER SHELL CALCIUM 500 + D PO) Take 1 tablet by mouth in the morning and at bedtime.   Yes [provider]  Cholecalciferol (VITAMIN D3) 1.25 MG (50000 UT) TABS Take 1 tablet by mouth daily.   Yes [provider]  docusate sodium (COLACE) 100 MG capsule Take 1 capsule (100 mg total) by mouth 2 (two) times daily. 11/21/18  Yes Swayze, Ava, DO  escitalopram (LEXAPRO) 10 MG tablet Take 15 mg by mouth daily.   Yes [provider]  esomeprazole (NEXIUM) 20 MG capsule Take 20 mg by mouth daily at 12 noon.    Yes [provider]  gabapentin (NEURONTIN) 100 MG capsule Take 100 mg by mouth 2 (two) times daily.   Yes [provider]  guaiFENesin (MUCINEX) 600 MG 12 hr tablet Take 600 mg by mouth 2 (two) times daily.   Yes [provider]  isosorbide mononitrate (IMDUR) 30 MG 24 hr tablet Take 30 mg by mouth daily.   Yes [provider]  isosorbide mononitrate (IMDUR) 60 MG 24 hr tablet TAKE 1 TABLET BY MOUTH DAILY. PLEASE MAKE YEARLY APPT WITH DR. Elease Hashimoto BEFORE ANYMORE REFILLS Patient taking differently: Take 60 mg by mouth daily.  12/01/18  Yes Nahser, Deloris Ping, MD  lidocaine (LIDODERM) 5 % Place 1 patch onto the skin 2 (two) times daily. Remove & Discard patch within 12 hours or as directed by MD   Yes [provider]  melatonin 5 MG TABS Take 5 mg by mouth at bedtime.   Yes [provider]  nebivolol (BYSTOLIC) 10 MG tablet Take 1 tablet (10 mg total) by mouth daily. Hold for SBP < 110 mmhg and HR < 60 BPM 09/12/18  Yes Emokpae, Courage, MD  nitroGLYCERIN (NITROSTAT) 0.4 MG SL tablet Place 1 tablet (0.4 mg total) under the tongue every 5 (five) minutes as needed for chest pain. 10/26/17  Yes Devoria Albe, MD  oxyCODONE (OXY IR/ROXICODONE) 5 MG immediate release tablet Take 10 mg by mouth every 4 (four) hours as needed for severe pain.   Yes [provider]  potassium chloride (MICRO-K) 10 MEQ CR capsule Take 1 capsule (10 mEq total) by mouth daily. 09/11/18  Yes Emokpae, Courage, MD  rosuvastatin (CRESTOR) 20 MG tablet Take 1 tablet (20 mg total) by mouth daily. Pt needs to keep appt in Oct for further refills 02/09/19  Yes Nahser, Deloris Ping, MD  acetaminophen (TYLENOL) 325 MG tablet Take 2 tablets (650 mg total) by mouth every 6 (six) hours as needed for mild pain, fever or headache (or Fever >/= 101). Patient not taking: Reported on 12/27/2019 09/11/18   Shon Hale, MD  HYDROcodone-acetaminophen (NORCO/VICODIN) 5-325 MG tablet Take 1-2 tablets by mouth every 4 (four) hours as needed. Patient not taking: Reported on 12/27/2019 04/09/19   Lorre Nick, MD    Inpatient Medications: Scheduled Meds: . feeding supplement (ENSURE ENLIVE)  237 mL Oral BID BM  . heparin injection (subcutaneous)  5,000 Units Subcutaneous Q8H  . multivitamin with minerals  1 tablet Oral Daily  . nebivolol  10 mg Oral Daily  . pantoprazole (PROTONIX) IV  40 mg Intravenous Q24H  . sodium chloride flush  5 mL Intracatheter Q8H   Continuous Infusions: . sodium chloride    . sodium chloride 75 mL/hr at 12/29/19 1034  . amiodarone 60 mg/hr (12/29/19 1625)  . amiodarone    . piperacillin-tazobactam (ZOSYN)  IV 3.375 g (12/29/19 1035)   PRN Meds: acetaminophen **OR** acetaminophen, albuterol, hydrALAZINE, ondansetron **OR** ondansetron (ZOFRAN) IV  Allergies:    Allergies  Allergen  Reactions  . Metoprolol Other (See Comments)    allopoecia  . Phenergan [Promethazine Hcl] Other (See Comments)    Facial numbness  . Prochlorperazine Edisylate Anaphylaxis  . Zetia [Ezetimibe] Nausea Only and Other (See Comments)    Abdominal pain  . Compazine [Prochlorperazine]     Facial  Paralysis       . Sulfur Other (See Comments)    paralyzes  face   . Niacin Rash  . Sulfonamide Derivatives Nausea And Vomiting    Social History:   Social History   Socioeconomic History  . Marital status: Widowed    Spouse name: Not on file  . Number of children: 0  . Years of education: Not on file  . Highest education level: Not on file  Occupational History  . Occupation: retired Magazine features editor: RETIRED  Tobacco Use  . Smoking status: Never Smoker  . Smokeless tobacco: Never Used  Vaping Use  . Vaping Use: Never used  Substance and Sexual Activity  . Alcohol use: No    Alcohol/week: 0.0 standard drinks  . Drug use: No  . Sexual activity: Never  Other Topics Concern  . Not on file  Social History Narrative  . Not on file   Social Determinants of Health   Financial Resource Strain:   . Difficulty of Paying Living Expenses:   Food Insecurity:   . Worried About Programme researcher, broadcasting/film/video in the Last Year:   . Barista in the Last Year:   Transportation Needs:   . Freight forwarder (Medical):   Marland Kitchen Lack of Transportation (Non-Medical):   Physical Activity:   . Days of Exercise per Week:   . Minutes of Exercise per Session:   Stress:   . Feeling of Stress :   Social Connections:   . Frequency of Communication with Friends and Family:   . Frequency of Social Gatherings with Friends and Family:   . Attends Religious Services:   . Active Member of Clubs or Organizations:   . Attends Banker Meetings:   Marland Kitchen Marital Status:  Intimate Partner Violence:   . Fear of Current or Ex-Partner:   . Emotionally Abused:   Marland Kitchen Physically Abused:   .  Sexually Abused:     Family History:    Family History  Problem Relation Age of Onset  . Heart attack Mother   . Diabetes Mother   . Heart disease Mother   . Rheum arthritis Mother   . Heart attack Father   . Allergies Father   . Diabetes Sister   . Stroke Sister   . Heart disease Sister   . Dementia Brother   . Healthy Sister   . Other Brother        LUNG ISSUES  . Other Brother        LUNG ISSUES  . Other Brother        LUNG ISSUES  . Diabetes Maternal Grandmother   . Diabetes Paternal Grandmother   . Colon cancer Neg Hx      ROS:  Please see the history of present illness.  Review of Systems  Unable to perform ROS: acuity of condition   Physical Exam/Data:   Vitals:   12/29/19 0400 12/29/19 0904 12/29/19 1210 12/29/19 1555  BP: (!) 121/49 (!) 102/53 (!) 92/58 119/60  Pulse:   (!) 37 89  Resp:  Temp: 98 F (36.7 C) 97.6 F (36.4 C)  97.9 F (36.6 C)  TempSrc: Oral Oral  Oral  SpO2:  98% 94% 95%  Weight:      Height:        Intake/Output Summary (Last 24 hours) at 12/29/2019 1716 Last data filed at 12/29/2019 1214 Gross per 24 hour  Intake --  Output 1430 ml  Net -1430 ml   Filed Weights   12/27/19 1402  Weight: 48.6 kg   Body mass index is 18.4 kg/m.  General:  Frail, ill-appearing female, in mild distress HEENT: sclera anicteric  Lymph: no adenopathy Neck: no JVD Endocrine:  No thryomegaly Vascular: No carotid bruits; distal pulses 2+ bilaterally Cardiac:  normal S1, S2; irregular rate and rhythm; no murmur  Lungs: Decreased breath sounds bases with some scattered rales to auscultation bilaterally, no wheezing, rhonchi  Abd: NABS, soft, +tender, no hepatomegaly Ext: no edema Musculoskeletal:  No deformities, BUE and BLE strength extremely weak Skin: warm and dry  Neuro:  CNs 2-12 intact, no focal abnormalities noted Psych:  Normal affect   EKG:  The EKG was personally reviewed and demonstrates: 12/27/2019 ECG is sinus rhythm, heart  rate 90, left bundle morphology with QRS duration 128 [name], no significant change from 03/2019 Telemetry:  Telemetry was personally reviewed and demonstrates: Atrial fibrillation, RVR, occasional PVCs  Relevant CV Studies: NST 2016:  The left ventricular ejection fraction is hyperdynamic (>65%).  Nuclear stress EF: 69%.  The study is normal.  This is a low risk study.   No chest pain with lexiscan stress. No EKG changes of ischemia. Normal perfusion. Normal LV systolic function without segmental wall motion abnormalities.   Echocardiogram 2017: - Left ventricle: The cavity size was normal. Systolic function was  normal. The estimated ejection fraction was in the range of 55%  to 60%. Wall motion was normal; there were no regional wall  motion abnormalities. Doppler parameters are consistent with  abnormal left ventricular relaxation (grade 1 diastolic  dysfunction).  - Aortic valve: Trileaflet; mildly thickened, mildly calcified  leaflets.  - Mitral valve: Calcified annulus. Mildly thickened, mildly  calcified leaflets .  - Left atrium:  The atrium was mildly dilated.  - Tricuspid valve: There was moderate regurgitation.  - Pulmonary arteries: Systolic pressure was moderately increased.   Echocardiogram 12/28/19: 1. No apical images obtained to evaluate diastolic function. This study  is limited for diagnostic assessment.  2. Left ventricular ejection fraction, by estimation, is 65 to 70%. The  left ventricle has normal function. Left ventricular endocardial border  not optimally defined to evaluate regional wall motion. There is mild  asymmetric left ventricular hypertrophy of the septal segment. Left  ventricular diastolic function could not be evaluated.  3. Right ventricular systolic function is normal. The right ventricular  size is normal. There is mildly elevated pulmonary artery systolic  pressure. The estimated right ventricular systolic pressure is 43.1  mmHg.  4. The mitral valve is degenerative. Trivial mitral valve regurgitation.  5. The tricuspid valve is degenerative. Tricuspid valve regurgitation is  mild to moderate.  6. The aortic valve is abnormal. AV is incompletely assessed.   Laboratory Data:  Chemistry Recent Labs  Lab 12/27/19 1301 12/27/19 1301 12/27/19 2009 12/28/19 0421 12/29/19 0207  NA 140   < > 139 137 140  K 3.8   < > 4.1 4.2 3.7  CL 101  --   --  106 105  CO2 25  --   --  16* 22  GLUCOSE 142*  --   --  72 74  BUN 24*  --   --  15 23  CREATININE 1.25*  --   --  0.73 1.12*  CALCIUM 9.6  --   --  7.3* 8.5*  GFRNONAA 38*  --   --  >60 44*  GFRAA 44*  --   --  >60 51*  ANIONGAP 14  --   --  15 13   < > = values in this interval not displayed.    Recent Labs  Lab 12/27/19 1301 12/28/19 0421  PROT 6.2* 3.3*  ALBUMIN 2.9* 1.4*  AST 111* 28  ALT 61* 23  ALKPHOS 112 48  BILITOT 1.2 0.5   Hematology Recent Labs  Lab 12/27/19 1301 12/27/19 1301 12/27/19 2009 12/28/19 0421 12/29/19 0207  WBC 21.7*  --   --  25.3* 19.9*  RBC 4.08  --   --  2.91* 3.17*  HGB 11.8*   < > 10.9* 8.6* 9.3*  HCT 38.0   < > 32.0* 28.9* 30.1*  MCV 93.1  --   --  99.3 95.0  MCH 28.9  --   --  29.6 29.3  MCHC 31.1  --   --  29.8* 30.9  RDW 13.5  --   --  13.9 13.9  PLT 304  --   --  188 204   < > = values in this interval not displayed.   Cardiac Enzymes    High Sensitivity Troponin:   Recent Labs  Lab 12/27/19 1301 12/27/19 1458 12/27/19 2330  TROPONINIHS 188* 218* 126*       BNPNo results for input(s): BNP, PROBNP in the last 168 hours.  DDimer  Recent Labs  Lab 12/27/19 1301  DDIMER 6.42*    Radiology/Studies:  DG Chest 1 View  Result Date: 12/27/2019 CLINICAL DATA:  Shortness of breath and respiratory failure EXAM: CHEST  1 VIEW COMPARISON:  April 09, 2019 FINDINGS: There is mild cardiomegaly. Aortic knob calcifications are seen. Overlying median sternotomy wires are present. Patchy airspace  opacity seen within the left lung base and right lung base. There is diffusely increased interstitial  markings throughout both lungs. No acute osseous abnormality. Cement fixation seen within the mid thoracic spine. IMPRESSION: Patchy interstitial airspace opacities throughout both lungs which could be due to asymmetric edema and/or infectious etiology Electronically Signed   By: Jonna Clark M.D.   On: 12/27/2019 20:45   CT ABDOMEN PELVIS W CONTRAST  Result Date: 12/27/2019 CLINICAL DATA:  Infectious gastroenteritis or colitis. EXAM: CT ABDOMEN AND PELVIS WITH CONTRAST TECHNIQUE: Multidetector CT imaging of the abdomen and pelvis was performed using the standard protocol following bolus administration of intravenous contrast. CONTRAST:  13mL OMNIPAQUE IOHEXOL 300 MG/ML  SOLN COMPARISON:  09/10/2018 FINDINGS: Lower chest: Heart is enlarged. Left base atelectasis or infiltrate. Hepatobiliary: Small area of low attenuation in the anterior liver, adjacent to the falciform ligament, is in a characteristic location for focal fatty deposition in similar to prior. Probable tiny layering gallstones. No intrahepatic or extrahepatic biliary dilation. Pancreas: No focal mass lesion. No dilatation of the main duct. No intraparenchymal cyst. No peripancreatic edema. Spleen: No splenomegaly. No focal mass lesion. Adrenals/Urinary Tract: No adrenal nodule or mass. 2 x 4 mm nonobstructing stone identified posterior interpolar right kidney. A second tiny 2 mm posterior interpolar right renal stone evident. There is mild right hydronephrosis secondary to the presence of a linear 3 x 6 x 12 mm stone at or just distal to the right UPJ. Urothelial enhancement around the stone is likely secondary to irritation. There is some fullness in the right ureter below this stone but no additional right ureteral stone evident. There is a phlebolith in the right pelvis seen to be below the ureter on coronal image 47 of series 7. Clustered stones  likely with staghorn component identified in the left renal pelvis extending into upper and lower pole collecting systems. No substantial hydronephrosis. No left hydroureter. There is irregular circumferential bladder wall thickening with possible left superolateral bladder diverticulum on coronal image 40 of series 7. Stomach/Bowel: Small hiatal hernia. Duodenum is normally positioned as is the ligament of Treitz. No small bowel wall thickening. No small bowel dilatation. The terminal ileum is normal. No gross colonic mass. No colonic wall thickening. Diverticular changes are noted in the left colon without evidence of diverticulitis. Mild wall thickening in the mid sigmoid colon may be related to muscular hypertrophy from the diverticulosis. No features of diverticulitis. Vascular/Lymphatic: There is abdominal aortic atherosclerosis without aneurysm. There is no gastrohepatic or hepatoduodenal ligament lymphadenopathy. No retroperitoneal or mesenteric lymphadenopathy. No pelvic sidewall lymphadenopathy. Reproductive: Fluid and gas identified in the right vaginal fornix (image 72/series 3). No obvious colovaginal fistula. Insert no adnexal mass Other: No intraperitoneal free fluid. Musculoskeletal: Bones are diffusely demineralized. Status post right hip replacement.L1 superior endplate compression fracture noted. IMPRESSION: 1. Mild right hydronephrosis secondary to the presence of a linear 3 x 6 x 12 mm stone at or just distal to the right UPJ. Urothelial enhancement around the stone is likely secondary to irritation. 2. Clustered stones likely with staghorn component identified in the left renal pelvis extending into upper and lower pole collecting systems. No substantial hydronephrosis. 3. Irregular circumferential bladder wall thickening with possible left superolateral bladder diverticulum. Bladder infection not excluded. 4. Fluid and gas identified in the right vaginal fornix. No evidence for colovaginal  fistula. Indeterminate finding. Patient did have some gas in the vaginal vault on previous CT from 09/10/2018. 5. Small hiatal hernia. 6. Left base atelectasis or infiltrate. 7. L1 superior endplate compression fracture. 8. Aortic Atherosclerosis (ICD10-I70.0). Electronically Signed   By:  Kennith Center M.D.   On: 12/27/2019 17:13   DG Abd Acute W/Chest  Result Date: 12/27/2019 CLINICAL DATA:  Shortness of breath, COVID positive, nausea and vomiting. EXAM: DG ABDOMEN ACUTE W/ 1V CHEST COMPARISON:  Chest x-ray dated 04/09/2019. Abdominal x-ray dated 12/30/2013. FINDINGS: Heart size and mediastinal contours are stable. Lungs are clear. Lungs are hyperexpanded. No pleural effusion or pneumothorax is seen. Old healed rib fractures on the LEFT. Overall bowel gas pattern is nonobstructive. No evidence of soft tissue mass or abnormal fluid collection. No evidence of free intraperitoneal air. Extensive aortic atherosclerosis. Degenerative spondylosis of the slightly scoliotic thoracolumbar spine, moderate in degree. No acute appearing osseous abnormality. IMPRESSION: 1. No active cardiopulmonary disease. No evidence of pneumonia or pulmonary edema. 2. COPD. 3. No acute findings within the abdomen or pelvis. Nonobstructive bowel gas pattern. Electronically Signed   By: Bary Richard M.D.   On: 12/27/2019 13:51   ECHOCARDIOGRAM COMPLETE  Result Date: 12/28/2019    ECHOCARDIOGRAM REPORT   Patient Name:   [name] Date of Exam: 12/28/2019 Medical Rec #:  [id number]   Height:       64.0 in Accession #:    [id number]  Weight:       107.2 lb Date of Birth:  August 10, 1928   BSA:          1.501 m Patient Age:    89 years    BP:           112/45 mmHg Patient Gender: F           HR:           80 bpm. Exam Location:  Inpatient Procedure: 2D Echo Indications:    428.31; I50.31 Acute diastolic (congestive) heart failure  History:        Patient has prior history of Echocardiogram examinations, most                 recent 07/20/2015.  CHF, CAD, Prior CABG, Stroke; Risk                 Factors:Hypertension and Dyslipidemia.  Sonographer:    Celene Skeen RDCS (AE) Referring Phys: [id number] Deno Lunger [facility]  Sonographer Comments: No apical window. restricted mobility IMPRESSIONS  1. No apical images obtained to evaluate diastolic function. This study is limited for diagnostic assessment.  2. Left ventricular ejection fraction, by estimation, is 65 to 70%. The left ventricle has normal function. Left ventricular endocardial border not optimally defined to evaluate regional wall motion. There is mild asymmetric left ventricular hypertrophy  of the septal segment. Left ventricular diastolic function could not be evaluated.  3. Right ventricular systolic function is normal. The right ventricular size is normal. There is mildly elevated pulmonary artery systolic pressure. The estimated right ventricular systolic pressure is 43.1 mmHg.  4. The mitral valve is degenerative. Trivial mitral valve regurgitation.  5. The tricuspid valve is degenerative. Tricuspid valve regurgitation is mild to moderate.  6. The aortic valve is abnormal. AV is incompletely assessed. FINDINGS  Left Ventricle: Left ventricular ejection fraction, by estimation, is 65 to 70%. The left ventricle has normal function. Left ventricular endocardial border not optimally defined to evaluate regional wall motion. The left ventricular internal cavity size was normal in size. There is mild asymmetric left ventricular hypertrophy of the septal segment. Left ventricular diastolic function could not be evaluated. Right Ventricle: The right ventricular size is normal. Right vetricular wall thickness was not assessed. Right ventricular systolic  function is normal. There is mildly elevated pulmonary artery systolic pressure. The tricuspid regurgitant velocity is 2.65 m/s, and with an assumed right atrial pressure of 15 mmHg, the estimated right ventricular systolic pressure is 43.1 mmHg. Left  Atrium: Left atrial size was not assessed. Right Atrium: Right atrial size was not assessed. Pericardium: There is no evidence of pericardial effusion. Mitral Valve: The mitral valve is degenerative in appearance. Moderate mitral annular calcification. Trivial mitral valve regurgitation. Tricuspid Valve: The tricuspid valve is degenerative in appearance. Tricuspid valve regurgitation is mild to moderate. Aortic Valve: The aortic valve is abnormal. Aortic valve regurgitation is not visualized. There is moderate calcification of the aortic valve. Pulmonic Valve: The pulmonic valve was not well visualized. Pulmonic valve regurgitation is not visualized. Aorta: The aortic root is normal in size and structure. IAS/Shunts: The interatrial septum was not well visualized.  LEFT VENTRICLE PLAX 2D LVIDd:         3.10 cm LVIDs:         1.90 cm LV PW:         1.00 cm LV IVS:        1.20 cm LVOT diam:     2.00 cm LVOT Area:     3.14 cm  LEFT ATRIUM         Index LA diam:    3.40 cm 2.27 cm/m   AORTA Ao Root diam: 2.60 cm TRICUSPID VALVE TR Peak grad:   28.1 mmHg TR Vmax:        265.00 cm/s  SHUNTS Systemic Diam: 2.00 cm Weston Brass MD Electronically signed by Weston Brass MD Signature Date/Time: 12/28/2019/5:40:27 PM    Final    IR NEPHROSTOMY PLACEMENT RIGHT  Result Date: 12/27/2019 INDICATION: Urinary sepsis secondary to obstructing stone at the level of the right UPJ/superior ureter. Request made for placement of a right-sided percutaneous nephrostomy catheter for infection source control purposes. EXAM: 1. ULTRASOUND GUIDANCE FOR PUNCTURE OF THE LEFT/RIGHT RENAL COLLECTING SYSTEM 2. RIGHT PERCUTANEOUS NEPHROSTOMY TUBE PLACEMENT. COMPARISON:  CT abdomen pelvis-12/27/2019 MEDICATIONS: Patient has received multiple intravenous antibiotics while in the emergency department.; The antibiotic was administered in an appropriate time frame prior to skin puncture. ANESTHESIA/SEDATION: Moderate (conscious) sedation was  employed during this procedure. A total of Versed 0.5 mg and Fentanyl 25 mcg was administered intravenously. Moderate Sedation Time: 17 minutes. The patient's level of consciousness and vital signs were monitored continuously by radiology nursing throughout the procedure under my direct supervision. CONTRAST:  10 cc mL Isovue 300 - administered into the renal collecting system FLUOROSCOPY TIME:  3 minutes, 24 seconds (22 mGy) COMPLICATIONS: None immediate. PROCEDURE: The procedure, risks, benefits, and alternatives were explained to the patient. Questions regarding the procedure were encouraged and answered. The patient understands and consents to the procedure. A timeout was performed prior to the initiation of the procedure. The right flank region was prepped and draped in the usual sterile fashion and a sterile drape was applied covering the operative field. A sterile gown and sterile gloves were used for the procedure. Local anesthesia was provided with 1% Lidocaine with epinephrine. Ultrasound was used to localize the right kidney. Attempts were made to access a posterior calyx with ultrasound guidance however this proved difficult secondary to angulation of kidney as well as patient's positioning on the fluoroscopy table. Fluoroscopic imaging demonstrated contrast from preceding abdominal CT within the right renal collecting system. As such, a posteroinferior calyx was targeted fluoroscopically with a 20 gauge needle. Access to the  collecting system was confirmed with advancement of Nitrex wire to the level the right renal pelvis. Over a Nitrex wire, the tract was dilated with an Accustick stent. Next, under intermittent fluoroscopic guidance and over a short Amplatz wire, the track was dilated ultimately allowing placement of a 10-French percutaneous nephrostomy catheter which was advanced to the level of the renal pelvis where the coil was formed and locked. Contrast was injected and several spot  fluoroscopic images were obtained in various obliquities. The catheter was secured at the skin with a Prolene retention suture and stat lock device and connected to a gravity bag was placed. Dressings were applied. The patient tolerated procedure well without immediate postprocedural complication. FINDINGS: Ultrasound scanning demonstrates a mildly dilated right renal collecting system. Under fluoroscopic guidance, a contrast opacified posteroinferior calyx was targeted allowing placement of a 10-French percutaneous nephrostomy catheter with end coiled and locked within the renal pelvis. Contrast injection confirmed appropriate positioning. IMPRESSION: Successful ultrasound and fluoroscopic guided placement of a right sided 10 French PCN. Electronically Signed   By: Simonne Come M.D.   On: 12/27/2019 21:18    Assessment and Plan:   1. Paroxysmal atrial fibrillation: patient has known history of PAF. Not on anticoagulation due to fall risk. She went into Afib with RVR this morning after home BBlocker was held since admission due to hypotension 2/2 urosepsis. Afib likely driving by underlying infection. She was started on an amiodarone gtt given inability to titrate BBlocker due to hypotension.  - Can continue amiodarone for rate/rhythm control, would not plan to use long-term - Continue BB for rate control, may need to temporarily change to metoprolol  - Risks of anticoagulation still outweigh benefits given fall risk.   2. CAD s/p CABG 1997 with redo in 2010: EKG is non-ischemic. HsTrop peaked at 218 and trended down this admission. Flat trend not c/w ACS; suspect demand ischemia in the setting of sepsis.  - Anticipate resuming crestor now that LFTs have normalized, but will wait till more stable - Would restart imdur once BP stabilizes  - Continue BB, will switch to metoprolol 25 mg q6hr  3. Urosepsis s/p nephrostomy tube - s/p nephrostomy tube per IR - may need surgery for bilateral staghorn  stones, multiple ureteroscopic procedures but not until she recovers from acute illness - per IM/Urology/IR  For questions or updates, please contact [facility] Please consult [email or web address] for contact info under Cardiology/STEMI.   Melida Quitter, PA-C  12/29/2019 5:16 PM [phone]  Patient seen and examined.  Agree with above documentation.  This is an 86 year old female with a history of CAD status post CABG in 1997 with redo in 2010, ischemic cardiomyopathy with subsequent improvement in EF, paroxysmal atrial fibrillation, asthma, carotid stenosis, CVA, frequent falls who we are consulted to see for atrial fibrillation today by Dr. Mahala Menghini.  Patient was admitted on 12/27/2019 after presenting with shortness of breath and nausea/vomiting/diarrhea.  She was found to be septic, secondary to UTI.  Subsequently was found to have right hydronephrosis due to ureteral stone.  She underwent right nephrostomy tube placement on 12/27/2019.  She has been treated for sepsis with IV antibiotics.  Her nebivolol was held initially due to soft BP in setting of sepsis.  This morning she went into a atrial fibrillation with RVR.  She was restarted on her home nebivolol.  She has not been on anticoagulation as an outpatient due to frequent falls.  Echocardiogram yesterday showed EF 65 to 70%, indeterminant diastolic function, normal  RV function, RVSP 43, mild to moderate TR.  EKG shows normal sinus rhythm, nonspecific interventricular conduction delay, LVH, poor R wave progression.  Telemetry shows atrial fibrillation with rates 90s to 120s.  Patient seen and examined.  Agree with above documentation.  On exam, patient is oriented to person and year (knew she was in hospital but could not tell me which one), irregular, tachycardic, no murmurs,  lungs CTAB, no LE edema, +JVD.  For her atrial fibrillation, rates appear reasonably controlled on home nebivolol 10 mg daily, but given soft BP, would favor switching  to metoprolol 25 mg q 6hr (h/o alopecia with metoprolol but can use for short-term).  Can titrate as needed for rate control and as BP tolerates.  She was also started on amio drip.  Would continue for now.  Unfortunately not able be anticoagulated due to frequent fall risk.  Little Ishikawahristopher L Shayonna Ocampo, MD

## 2019-12-29 NOTE — Progress Notes (Signed)
Initial Nutrition Assessment  DOCUMENTATION CODES:   Underweight (suspect PCM)  INTERVENTION:    Ensure Enlive po BID, each supplement provides 350 kcal and 20 grams of protein  MVI daily   NUTRITION DIAGNOSIS:   Increased nutrient needs related to acute illness as evidenced by estimated needs.  GOAL:   Patient will meet greater than or equal to 90% of their needs  MONITOR:   PO intake, Supplement acceptance, Weight trends, Labs, I & O's  REASON FOR ASSESSMENT:   Malnutrition Screening Tool    ASSESSMENT:   Patient with PMH significant for CAD s/p CABG, CHF, CVA, PVD s/p R CEA, asthma, GERD, HLD, and HTN. Presents this admission with UTI and UPJ stone.   8/1- s/p IR nephrostomy   Unable to obtain history from pt at this time. Per RN, pt had issue with regular food consistency and diet was downgraded to DYS 2. Reached out to MD for possible SLP evaluation. No meal intakes documented. Will need to obtain nutrition history and NFPE if possible.   Records indicate pt weighed 60 kg on 11/21/2018 and 48.6 kg this admission (19% wt loss in a little over a year, insignificant for time frame). Suspect malnutrition but unable to diagnose without NFPE.   Drips: NS @ 75 ml/hr  Labs: CBG 72-142   Diet Order:   Diet Order            DIET DYS 2 Room service appropriate? Yes; Fluid consistency: Thin  Diet effective now                 EDUCATION NEEDS:   Not appropriate for education at this time  Skin:  Skin Assessment: Reviewed RN Assessment  Last BM:  PTA  Height:   Ht Readings from Last 1 Encounters:  12/27/19 5\' 4"  (1.626 m)    Weight:   Wt Readings from Last 1 Encounters:  12/27/19 48.6 kg    BMI:  Body mass index is 18.4 kg/m.  Estimated Nutritional Needs:   Kcal:  1500-1700 kcal  Protein:  75-90 grams  Fluid:  >/= 1.5 L/day   02/26/20 RD, LDN Clinical Nutrition Pager listed in AMION

## 2019-12-29 NOTE — Progress Notes (Signed)
Subjective: NAEO.  Normotensive and afebrile over the last shift.  White blood cell count is improving.  Creatinine slightly up, but urine output is appropriate. Objective: Vital signs in last 24 hours: Temp:  [97.1 F (36.2 C)-98.4 F (36.9 C)] 98 F (36.7 C) (08/03 0400) Pulse Rate:  [78-87] 81 (08/02 1800) Resp:  [20-25] 20 (08/03 0141) BP: (93-121)/(41-88) 121/49 (08/03 0400) SpO2:  [91 %-98 %] 98 % (08/02 1922)  Intake/Output from previous day: 08/02 0701 - 08/03 0700 In: 50 [IV Piggyback:50] Out: 730 [Urine:230]  Intake/Output this shift: No intake/output data recorded.  Physical Exam:  General: Resting comfortably in bed CV: RRR, palpable distal pulses Lungs: CTAB, equal chest rise Abdomen: Soft, NTND, no rebound or guarding Gu: Right-sided nephrostomy tube in place and draining blood-tinged urine Ext: NT, No erythema  Lab Results: Recent Labs    12/27/19 2009 12/28/19 0421 12/29/19 0207  HGB 10.9* 8.6* 9.3*  HCT 32.0* 28.9* 30.1*   BMET Recent Labs    12/28/19 0421 12/29/19 0207  NA 137 140  K 4.2 3.7  CL 106 105  CO2 16* 22  GLUCOSE 72 74  BUN 15 23  CREATININE 0.73 1.12*  CALCIUM 7.3* 8.5*     Studies/Results: DG Chest 1 View  Result Date: 12/27/2019 CLINICAL DATA:  Shortness of breath and respiratory failure EXAM: CHEST  1 VIEW COMPARISON:  April 09, 2019 FINDINGS: There is mild cardiomegaly. Aortic knob calcifications are seen. Overlying median sternotomy wires are present. Patchy airspace opacity seen within the left lung base and right lung base. There is diffusely increased interstitial markings throughout both lungs. No acute osseous abnormality. Cement fixation seen within the mid thoracic spine. IMPRESSION: Patchy interstitial airspace opacities throughout both lungs which could be due to asymmetric edema and/or infectious etiology Electronically Signed   By: Jonna Clark M.D.   On: 12/27/2019 20:45   CT ABDOMEN PELVIS W  CONTRAST  Result Date: 12/27/2019 CLINICAL DATA:  Infectious gastroenteritis or colitis. EXAM: CT ABDOMEN AND PELVIS WITH CONTRAST TECHNIQUE: Multidetector CT imaging of the abdomen and pelvis was performed using the standard protocol following bolus administration of intravenous contrast. CONTRAST:  54mL OMNIPAQUE IOHEXOL 300 MG/ML  SOLN COMPARISON:  09/10/2018 FINDINGS: Lower chest: Heart is enlarged. Left base atelectasis or infiltrate. Hepatobiliary: Small area of low attenuation in the anterior liver, adjacent to the falciform ligament, is in a characteristic location for focal fatty deposition in similar to prior. Probable tiny layering gallstones. No intrahepatic or extrahepatic biliary dilation. Pancreas: No focal mass lesion. No dilatation of the main duct. No intraparenchymal cyst. No peripancreatic edema. Spleen: No splenomegaly. No focal mass lesion. Adrenals/Urinary Tract: No adrenal nodule or mass. 2 x 4 mm nonobstructing stone identified posterior interpolar right kidney. A second tiny 2 mm posterior interpolar right renal stone evident. There is mild right hydronephrosis secondary to the presence of a linear 3 x 6 x 12 mm stone at or just distal to the right UPJ. Urothelial enhancement around the stone is likely secondary to irritation. There is some fullness in the right ureter below this stone but no additional right ureteral stone evident. There is a phlebolith in the right pelvis seen to be below the ureter on coronal image 47 of series 7. Clustered stones likely with staghorn component identified in the left renal pelvis extending into upper and lower pole collecting systems. No substantial hydronephrosis. No left hydroureter. There is irregular circumferential bladder wall thickening with possible left superolateral bladder diverticulum on coronal  image 40 of series 7. Stomach/Bowel: Small hiatal hernia. Duodenum is normally positioned as is the ligament of Treitz. No small bowel wall  thickening. No small bowel dilatation. The terminal ileum is normal. No gross colonic mass. No colonic wall thickening. Diverticular changes are noted in the left colon without evidence of diverticulitis. Mild wall thickening in the mid sigmoid colon may be related to muscular hypertrophy from the diverticulosis. No features of diverticulitis. Vascular/Lymphatic: There is abdominal aortic atherosclerosis without aneurysm. There is no gastrohepatic or hepatoduodenal ligament lymphadenopathy. No retroperitoneal or mesenteric lymphadenopathy. No pelvic sidewall lymphadenopathy. Reproductive: Fluid and gas identified in the right vaginal fornix (image 72/series 3). No obvious colovaginal fistula. Insert no adnexal mass Other: No intraperitoneal free fluid. Musculoskeletal: Bones are diffusely demineralized. Status post right hip replacement.L1 superior endplate compression fracture noted. IMPRESSION: 1. Mild right hydronephrosis secondary to the presence of a linear 3 x 6 x 12 mm stone at or just distal to the right UPJ. Urothelial enhancement around the stone is likely secondary to irritation. 2. Clustered stones likely with staghorn component identified in the left renal pelvis extending into upper and lower pole collecting systems. No substantial hydronephrosis. 3. Irregular circumferential bladder wall thickening with possible left superolateral bladder diverticulum. Bladder infection not excluded. 4. Fluid and gas identified in the right vaginal fornix. No evidence for colovaginal fistula. Indeterminate finding. Patient did have some gas in the vaginal vault on previous CT from 09/10/2018. 5. Small hiatal hernia. 6. Left base atelectasis or infiltrate. 7. L1 superior endplate compression fracture. 8. Aortic Atherosclerosis (ICD10-I70.0). Electronically Signed   By: Kennith Center M.D.   On: 12/27/2019 17:13   DG Abd Acute W/Chest  Result Date: 12/27/2019 CLINICAL DATA:  Shortness of breath, COVID positive, nausea  and vomiting. EXAM: DG ABDOMEN ACUTE W/ 1V CHEST COMPARISON:  Chest x-ray dated 04/09/2019. Abdominal x-ray dated 12/30/2013. FINDINGS: Heart size and mediastinal contours are stable. Lungs are clear. Lungs are hyperexpanded. No pleural effusion or pneumothorax is seen. Old healed rib fractures on the LEFT. Overall bowel gas pattern is nonobstructive. No evidence of soft tissue mass or abnormal fluid collection. No evidence of free intraperitoneal air. Extensive aortic atherosclerosis. Degenerative spondylosis of the slightly scoliotic thoracolumbar spine, moderate in degree. No acute appearing osseous abnormality. IMPRESSION: 1. No active cardiopulmonary disease. No evidence of pneumonia or pulmonary edema. 2. COPD. 3. No acute findings within the abdomen or pelvis. Nonobstructive bowel gas pattern. Electronically Signed   By: Bary Richard M.D.   On: 12/27/2019 13:51   ECHOCARDIOGRAM COMPLETE  Result Date: 12/28/2019    ECHOCARDIOGRAM REPORT   Patient Name:   [name] Date of Exam: 12/28/2019 Medical Rec #:  [id number]   Height:       64.0 in Accession #:    [id number]  Weight:       107.2 lb Date of Birth:  02/26/1929   BSA:          1.501 m Patient Age:    86 years    BP:           112/45 mmHg Patient Gender: F           HR:           80 bpm. Exam Location:  Inpatient Procedure: 2D Echo Indications:    428.31; I50.31 Acute diastolic (congestive) heart failure  History:        Patient has prior history of Echocardiogram examinations, most  recent 07/20/2015. CHF, CAD, Prior CABG, Stroke; Risk                 Factors:Hypertension and Dyslipidemia.  Sonographer:    Celene SkeenVijay Shankar RDCS (AE) Referring Phys: [id number] Deno LungerGEORGE J [facility]ALHOUB  Sonographer Comments: No apical window. restricted mobility IMPRESSIONS  1. No apical images obtained to evaluate diastolic function. This study is limited for diagnostic assessment.  2. Left ventricular ejection fraction, by estimation, is 65 to 70%. The left ventricle  has normal function. Left ventricular endocardial border not optimally defined to evaluate regional wall motion. There is mild asymmetric left ventricular hypertrophy  of the septal segment. Left ventricular diastolic function could not be evaluated.  3. Right ventricular systolic function is normal. The right ventricular size is normal. There is mildly elevated pulmonary artery systolic pressure. The estimated right ventricular systolic pressure is 43.1 mmHg.  4. The mitral valve is degenerative. Trivial mitral valve regurgitation.  5. The tricuspid valve is degenerative. Tricuspid valve regurgitation is mild to moderate.  6. The aortic valve is abnormal. AV is incompletely assessed. FINDINGS  Left Ventricle: Left ventricular ejection fraction, by estimation, is 65 to 70%. The left ventricle has normal function. Left ventricular endocardial border not optimally defined to evaluate regional wall motion. The left ventricular internal cavity size was normal in size. There is mild asymmetric left ventricular hypertrophy of the septal segment. Left ventricular diastolic function could not be evaluated. Right Ventricle: The right ventricular size is normal. Right vetricular wall thickness was not assessed. Right ventricular systolic function is normal. There is mildly elevated pulmonary artery systolic pressure. The tricuspid regurgitant velocity is 2.65 m/s, and with an assumed right atrial pressure of 15 mmHg, the estimated right ventricular systolic pressure is 43.1 mmHg. Left Atrium: Left atrial size was not assessed. Right Atrium: Right atrial size was not assessed. Pericardium: There is no evidence of pericardial effusion. Mitral Valve: The mitral valve is degenerative in appearance. Moderate mitral annular calcification. Trivial mitral valve regurgitation. Tricuspid Valve: The tricuspid valve is degenerative in appearance. Tricuspid valve regurgitation is mild to moderate. Aortic Valve: The aortic valve is  abnormal. Aortic valve regurgitation is not visualized. There is moderate calcification of the aortic valve. Pulmonic Valve: The pulmonic valve was not well visualized. Pulmonic valve regurgitation is not visualized. Aorta: The aortic root is normal in size and structure. IAS/Shunts: The interatrial septum was not well visualized.  LEFT VENTRICLE PLAX 2D LVIDd:         3.10 cm LVIDs:         1.90 cm LV PW:         1.00 cm LV IVS:        1.20 cm LVOT diam:     2.00 cm LVOT Area:     3.14 cm  LEFT ATRIUM         Index LA diam:    3.40 cm 2.27 cm/m   AORTA Ao Root diam: 2.60 cm TRICUSPID VALVE TR Peak grad:   28.1 mmHg TR Vmax:        265.00 cm/s  SHUNTS Systemic Diam: 2.00 cm Weston BrassGayatri Acharya MD Electronically signed by Weston BrassGayatri Acharya MD Signature Date/Time: 12/28/2019/5:40:27 PM    Final    IR NEPHROSTOMY PLACEMENT RIGHT  Result Date: 12/27/2019 INDICATION: Urinary sepsis secondary to obstructing stone at the level of the right UPJ/superior ureter. Request made for placement of a right-sided percutaneous nephrostomy catheter for infection source control purposes. EXAM: 1. ULTRASOUND GUIDANCE FOR PUNCTURE OF  THE LEFT/RIGHT RENAL COLLECTING SYSTEM 2. RIGHT PERCUTANEOUS NEPHROSTOMY TUBE PLACEMENT. COMPARISON:  CT abdomen pelvis-12/27/2019 MEDICATIONS: Patient has received multiple intravenous antibiotics while in the emergency department.; The antibiotic was administered in an appropriate time frame prior to skin puncture. ANESTHESIA/SEDATION: Moderate (conscious) sedation was employed during this procedure. A total of Versed 0.5 mg and Fentanyl 25 mcg was administered intravenously. Moderate Sedation Time: 17 minutes. The patient's level of consciousness and vital signs were monitored continuously by radiology nursing throughout the procedure under my direct supervision. CONTRAST:  10 cc mL Isovue 300 - administered into the renal collecting system FLUOROSCOPY TIME:  3 minutes, 24 seconds (22 mGy) COMPLICATIONS:  None immediate. PROCEDURE: The procedure, risks, benefits, and alternatives were explained to the patient. Questions regarding the procedure were encouraged and answered. The patient understands and consents to the procedure. A timeout was performed prior to the initiation of the procedure. The right flank region was prepped and draped in the usual sterile fashion and a sterile drape was applied covering the operative field. A sterile gown and sterile gloves were used for the procedure. Local anesthesia was provided with 1% Lidocaine with epinephrine. Ultrasound was used to localize the right kidney. Attempts were made to access a posterior calyx with ultrasound guidance however this proved difficult secondary to angulation of kidney as well as patient's positioning on the fluoroscopy table. Fluoroscopic imaging demonstrated contrast from preceding abdominal CT within the right renal collecting system. As such, a posteroinferior calyx was targeted fluoroscopically with a 20 gauge needle. Access to the collecting system was confirmed with advancement of Nitrex wire to the level the right renal pelvis. Over a Nitrex wire, the tract was dilated with an Accustick stent. Next, under intermittent fluoroscopic guidance and over a short Amplatz wire, the track was dilated ultimately allowing placement of a 10-French percutaneous nephrostomy catheter which was advanced to the level of the renal pelvis where the coil was formed and locked. Contrast was injected and several spot fluoroscopic images were obtained in various obliquities. The catheter was secured at the skin with a Prolene retention suture and stat lock device and connected to a gravity bag was placed. Dressings were applied. The patient tolerated procedure well without immediate postprocedural complication. FINDINGS: Ultrasound scanning demonstrates a mildly dilated right renal collecting system. Under fluoroscopic guidance, a contrast opacified posteroinferior  calyx was targeted allowing placement of a 10-French percutaneous nephrostomy catheter with end coiled and locked within the renal pelvis. Contrast injection confirmed appropriate positioning. IMPRESSION: Successful ultrasound and fluoroscopic guided placement of a right sided 10 French PCN. Electronically Signed   By: Simonne Come M.D.   On: 12/27/2019 21:18    Assessment/Plan: 86 year old female with an obstructing 1 cm right UPJ calculus, left partial staghorn with resolving septic shock.  Status post right PCN on 12/27/2019  -Blood and urine cultures are pending.  Continue broad-spectrum antibiotic coverage -The patient is very apprehensive about having surgery to address her bilateral stones.  She will likely need to have multiple endoscopic procedures to completely address her bilateral stone burden. -I will attempt to contact the patient's niece to ascertain what the patient's and family's wishes are.   LOS: 2 days   Rhoderick Moody, MD [facility] Pager: [phone]  12/29/2019, 7:50 AM

## 2019-12-29 NOTE — Progress Notes (Signed)
PROGRESS NOTE  [name]  DIY:[id number] DOB: 01-23-1929 DOA: 12/27/2019 PCP: Donita Brooks, MD  Brief Narrative:  86 year old female [facility] CABG 1997, left-sided CVA 2009 Prior pleural effusions with redo CABG 07/06/2008 Bimodal heart failure echo 06/2015 EF 55-60% Right carotid endarterectomy 1995 Prior partial colectomy for diverticular disease Asthma HLD Atrial fibrillation CHADS2 score >4 Multiple admissions 2018, 2020 falls with fractures Right hip fracture status post repair 10/2018  Presented from facility altered mental status?  Sepsis-CT abdomen pelvis = hydronephrosis + stone right UPJ-Dr. Francesco Sor urology recommended right nephrostomy  Assessment & Plan:   Principal Problem:   Severe sepsis with acute organ dysfunction due to Gram negative bacteria (HCC) Active Problems:   Essential hypertension   GERD without esophagitis   PAF (paroxysmal atrial fibrillation) (HCC)   Acute metabolic encephalopathy   Lactic acidosis   Elevated troponin level not due myocardial infarction   Mixed hyperlipidemia   Obstruction of right ureteropelvic junction (UPJ) due to stone   1. Sepsis?  Proteus with complicated urinary infection with UPJ stone a. Greatly appreciate Dr. Sande Brothers input into complex case b. Temporizing measure nephrostomy performed 8/1 under IR c. Continue broad-spectrum Zosyn hopefully can be deescalated given mild azotemia on labs 8/3 2. Prior diastolic heart failure in the setting of CABG X2 a. Holding Imdur 60, Bystolic 10 secondary to sepsis related hypotension b. Reinitiate primarily beta-blocker if resolves 3. Paroxysmal  A. Fib-heart rates moderately controlled seems to be in sinus a. Slightly hypotensive because of sepsis as above b. Heart rates are uncontrolled in the 1 10-1 20 range and she looks like she converted to A. fib this morning at around 650 c. I will cautiously reinitiate Bystolic 10 mg and keep her on saline today d. If she maintains  hypotension and tachycardia may need to consult cardiology-May need to load with IV amiodarone or digoxin 4. Toxic metabolic encephalopathy a. Secondary to sepsis-unclear baseline b. Would hold possible sedating agents Lexapro 15 gabapentin 100 twice daily c. It appears she also is on opiate but hold oxycodone 10 every 4 from PTA as well as hydrocodone 5. AKI on admission secondary to sepsis with metabolic acidosis a. Continue saline repletion monitor trends b. Azotemia is not resolved continuing fluids cautiously given underlying heart failure 6. Severe hypomagnesemia a. Replaced with magnesium 4 g IV piggyback b. Magnesium levels now appropriate after replacement 7. Troponin elevation secondary to sepsis likely 8. Likely dilutional anemia from fluid resuscitation 9. BMI 18-Albumin is 1.4 she is quite cachectic 10. She is appropriately DNR  DVT prophylaxis:  Heparin per pharmacy  code Status: DNR-she asks me "what would you do" and I told her would probably keep CODE STATUS is DNR-she is close to 89, frail has multiple comorbidities-I await family to discuss further planning Family Communication: called Claris Che [phone] again on 12/29/18/2021 to discuss plan of care/DNR status Rest, no answer, left VM Disposition: Needs stepdown level of care given tenuous past  Status is: Inpatient  Remains inpatient appropriate because:Hemodynamically unstable, Persistent severe electrolyte disturbances and IV treatments appropriate due to intensity of illness or inability to take PO   Dispo: The patient is from: Home              Anticipated d/c is to: SNF              Anticipated d/c date is: 2 days              Patient currently is not medically stable  to d/c.    Consultants:   Urologist Dr. Liliane Shi  IR Dr. Grace Isaac  Procedures: Nephrostomy tube placed 8/1  Antimicrobials: Zosyn   Subjective: More awake coherent seems a little confused States she has abdominal pain no chills no  rigors She is undecided as to whether she wants any urological surgery-I tried to call her niece who is at the beach and was unable to reach her-I have left a detailed message for her  Objective: Vitals:   12/28/19 2309 12/29/19 0141 12/29/19 0400 12/29/19 0904  BP:   (!) 121/49 (!) 102/53  Pulse:      Resp:  20  20  Temp: 98.3 F (36.8 C) 98.4 F (36.9 C) 98 F (36.7 C) 97.6 F (36.4 C)  TempSrc: Oral Oral Oral Oral  SpO2:    98%  Weight:      Height:        Intake/Output Summary (Last 24 hours) at 12/29/2019 4627 Last data filed at 12/29/2019 0545 Gross per 24 hour  Intake --  Output 1230 ml  Net -1230 ml   Filed Weights   12/27/19 1402  Weight: 48.6 kg    Examination:  General exam: Cachectic pleasant no distress Respiratory system: Limited exam relatively clear Cardiovascular system: S1-S2 in A. fib 120s range Gastrointestinal system: Generalized tenderness no rebound no guarding no organomegaly. Central nervous system: Intact moves 4 limbs equally however limited by weakness Extremities: No lower extremity edema rash bruise ROM intact to major muscle groups Skin: As above Psychiatry: Euthymic  Data Reviewed: I have personally reviewed following labs and imaging studies CO2 up from 16-22 BUN/creatinine 15/0.7-->23/1.1 Magnesium is up from 0 point 4:09 rounds 2.3 White count down from 25-19 Hemoglobin stable in the 8-9 range  Radiology Studies: DG Chest 1 View  Result Date: 12/27/2019 CLINICAL DATA:  Shortness of breath and respiratory failure EXAM: CHEST  1 VIEW COMPARISON:  April 09, 2019 FINDINGS: There is mild cardiomegaly. Aortic knob calcifications are seen. Overlying median sternotomy wires are present. Patchy airspace opacity seen within the left lung base and right lung base. There is diffusely increased interstitial markings throughout both lungs. No acute osseous abnormality. Cement fixation seen within the mid thoracic spine. IMPRESSION: Patchy  interstitial airspace opacities throughout both lungs which could be due to asymmetric edema and/or infectious etiology Electronically Signed   By: Jonna Clark M.D.   On: 12/27/2019 20:45   CT ABDOMEN PELVIS W CONTRAST  Result Date: 12/27/2019 CLINICAL DATA:  Infectious gastroenteritis or colitis. EXAM: CT ABDOMEN AND PELVIS WITH CONTRAST TECHNIQUE: Multidetector CT imaging of the abdomen and pelvis was performed using the standard protocol following bolus administration of intravenous contrast. CONTRAST:  14mL OMNIPAQUE IOHEXOL 300 MG/ML  SOLN COMPARISON:  09/10/2018 FINDINGS: Lower chest: Heart is enlarged. Left base atelectasis or infiltrate. Hepatobiliary: Small area of low attenuation in the anterior liver, adjacent to the falciform ligament, is in a characteristic location for focal fatty deposition in similar to prior. Probable tiny layering gallstones. No intrahepatic or extrahepatic biliary dilation. Pancreas: No focal mass lesion. No dilatation of the main duct. No intraparenchymal cyst. No peripancreatic edema. Spleen: No splenomegaly. No focal mass lesion. Adrenals/Urinary Tract: No adrenal nodule or mass. 2 x 4 mm nonobstructing stone identified posterior interpolar right kidney. A second tiny 2 mm posterior interpolar right renal stone evident. There is mild right hydronephrosis secondary to the presence of a linear 3 x 6 x 12 mm stone at or just distal to the right UPJ.  Urothelial enhancement around the stone is likely secondary to irritation. There is some fullness in the right ureter below this stone but no additional right ureteral stone evident. There is a phlebolith in the right pelvis seen to be below the ureter on coronal image 47 of series 7. Clustered stones likely with staghorn component identified in the left renal pelvis extending into upper and lower pole collecting systems. No substantial hydronephrosis. No left hydroureter. There is irregular circumferential bladder wall thickening  with possible left superolateral bladder diverticulum on coronal image 40 of series 7. Stomach/Bowel: Small hiatal hernia. Duodenum is normally positioned as is the ligament of Treitz. No small bowel wall thickening. No small bowel dilatation. The terminal ileum is normal. No gross colonic mass. No colonic wall thickening. Diverticular changes are noted in the left colon without evidence of diverticulitis. Mild wall thickening in the mid sigmoid colon may be related to muscular hypertrophy from the diverticulosis. No features of diverticulitis. Vascular/Lymphatic: There is abdominal aortic atherosclerosis without aneurysm. There is no gastrohepatic or hepatoduodenal ligament lymphadenopathy. No retroperitoneal or mesenteric lymphadenopathy. No pelvic sidewall lymphadenopathy. Reproductive: Fluid and gas identified in the right vaginal fornix (image 72/series 3). No obvious colovaginal fistula. Insert no adnexal mass Other: No intraperitoneal free fluid. Musculoskeletal: Bones are diffusely demineralized. Status post right hip replacement.L1 superior endplate compression fracture noted. IMPRESSION: 1. Mild right hydronephrosis secondary to the presence of a linear 3 x 6 x 12 mm stone at or just distal to the right UPJ. Urothelial enhancement around the stone is likely secondary to irritation. 2. Clustered stones likely with staghorn component identified in the left renal pelvis extending into upper and lower pole collecting systems. No substantial hydronephrosis. 3. Irregular circumferential bladder wall thickening with possible left superolateral bladder diverticulum. Bladder infection not excluded. 4. Fluid and gas identified in the right vaginal fornix. No evidence for colovaginal fistula. Indeterminate finding. Patient did have some gas in the vaginal vault on previous CT from 09/10/2018. 5. Small hiatal hernia. 6. Left base atelectasis or infiltrate. 7. L1 superior endplate compression fracture. 8. Aortic  Atherosclerosis (ICD10-I70.0). Electronically Signed   By: Kennith Center M.D.   On: 12/27/2019 17:13   DG Abd Acute W/Chest  Result Date: 12/27/2019 CLINICAL DATA:  Shortness of breath, COVID positive, nausea and vomiting. EXAM: DG ABDOMEN ACUTE W/ 1V CHEST COMPARISON:  Chest x-ray dated 04/09/2019. Abdominal x-ray dated 12/30/2013. FINDINGS: Heart size and mediastinal contours are stable. Lungs are clear. Lungs are hyperexpanded. No pleural effusion or pneumothorax is seen. Old healed rib fractures on the LEFT. Overall bowel gas pattern is nonobstructive. No evidence of soft tissue mass or abnormal fluid collection. No evidence of free intraperitoneal air. Extensive aortic atherosclerosis. Degenerative spondylosis of the slightly scoliotic thoracolumbar spine, moderate in degree. No acute appearing osseous abnormality. IMPRESSION: 1. No active cardiopulmonary disease. No evidence of pneumonia or pulmonary edema. 2. COPD. 3. No acute findings within the abdomen or pelvis. Nonobstructive bowel gas pattern. Electronically Signed   By: Bary Richard M.D.   On: 12/27/2019 13:51   ECHOCARDIOGRAM COMPLETE  Result Date: 12/28/2019    ECHOCARDIOGRAM REPORT   Patient Name:   [name] Date of Exam: 12/28/2019 Medical Rec #:  [id number]   Height:       64.0 in Accession #:    [id number]  Weight:       107.2 lb Date of Birth:  1928-07-12   BSA:          1.501 m  Patient Age:    89 years    BP:           112/45 mmHg Patient Gender: F           HR:           80 bpm. Exam Location:  Inpatient Procedure: 2D Echo Indications:    428.31; I50.31 Acute diastolic (congestive) heart failure  History:        Patient has prior history of Echocardiogram examinations, most                 recent 07/20/2015. CHF, CAD, Prior CABG, Stroke; Risk                 Factors:Hypertension and Dyslipidemia.  Sonographer:    Celene SkeenVijay Shankar RDCS (AE) Referring Phys: [id number] Deno LungerGEORGE J Four State Surgery CenterHALHOUB  Sonographer Comments: No apical window. restricted mobility  IMPRESSIONS  1. No apical images obtained to evaluate diastolic function. This study is limited for diagnostic assessment.  2. Left ventricular ejection fraction, by estimation, is 65 to 70%. The left ventricle has normal function. Left ventricular endocardial border not optimally defined to evaluate regional wall motion. There is mild asymmetric left ventricular hypertrophy  of the septal segment. Left ventricular diastolic function could not be evaluated.  3. Right ventricular systolic function is normal. The right ventricular size is normal. There is mildly elevated pulmonary artery systolic pressure. The estimated right ventricular systolic pressure is 43.1 mmHg.  4. The mitral valve is degenerative. Trivial mitral valve regurgitation.  5. The tricuspid valve is degenerative. Tricuspid valve regurgitation is mild to moderate.  6. The aortic valve is abnormal. AV is incompletely assessed. FINDINGS  Left Ventricle: Left ventricular ejection fraction, by estimation, is 65 to 70%. The left ventricle has normal function. Left ventricular endocardial border not optimally defined to evaluate regional wall motion. The left ventricular internal cavity size was normal in size. There is mild asymmetric left ventricular hypertrophy of the septal segment. Left ventricular diastolic function could not be evaluated. Right Ventricle: The right ventricular size is normal. Right vetricular wall thickness was not assessed. Right ventricular systolic function is normal. There is mildly elevated pulmonary artery systolic pressure. The tricuspid regurgitant velocity is 2.65 m/s, and with an assumed right atrial pressure of 15 mmHg, the estimated right ventricular systolic pressure is 43.1 mmHg. Left Atrium: Left atrial size was not assessed. Right Atrium: Right atrial size was not assessed. Pericardium: There is no evidence of pericardial effusion. Mitral Valve: The mitral valve is degenerative in appearance. Moderate mitral annular  calcification. Trivial mitral valve regurgitation. Tricuspid Valve: The tricuspid valve is degenerative in appearance. Tricuspid valve regurgitation is mild to moderate. Aortic Valve: The aortic valve is abnormal. Aortic valve regurgitation is not visualized. There is moderate calcification of the aortic valve. Pulmonic Valve: The pulmonic valve was not well visualized. Pulmonic valve regurgitation is not visualized. Aorta: The aortic root is normal in size and structure. IAS/Shunts: The interatrial septum was not well visualized.  LEFT VENTRICLE PLAX 2D LVIDd:         3.10 cm LVIDs:         1.90 cm LV PW:         1.00 cm LV IVS:        1.20 cm LVOT diam:     2.00 cm LVOT Area:     3.14 cm  LEFT ATRIUM         Index LA diam:    3.40 cm 2.27  cm/m   AORTA Ao Root diam: 2.60 cm TRICUSPID VALVE TR Peak grad:   28.1 mmHg TR Vmax:        265.00 cm/s  SHUNTS Systemic Diam: 2.00 cm Weston Brass MD Electronically signed by Weston Brass MD Signature Date/Time: 12/28/2019/5:40:27 PM    Final    IR NEPHROSTOMY PLACEMENT RIGHT  Result Date: 12/27/2019 INDICATION: Urinary sepsis secondary to obstructing stone at the level of the right UPJ/superior ureter. Request made for placement of a right-sided percutaneous nephrostomy catheter for infection source control purposes. EXAM: 1. ULTRASOUND GUIDANCE FOR PUNCTURE OF THE LEFT/RIGHT RENAL COLLECTING SYSTEM 2. RIGHT PERCUTANEOUS NEPHROSTOMY TUBE PLACEMENT. COMPARISON:  CT abdomen pelvis-12/27/2019 MEDICATIONS: Patient has received multiple intravenous antibiotics while in the emergency department.; The antibiotic was administered in an appropriate time frame prior to skin puncture. ANESTHESIA/SEDATION: Moderate (conscious) sedation was employed during this procedure. A total of Versed 0.5 mg and Fentanyl 25 mcg was administered intravenously. Moderate Sedation Time: 17 minutes. The patient's level of consciousness and vital signs were monitored continuously by radiology  nursing throughout the procedure under my direct supervision. CONTRAST:  10 cc mL Isovue 300 - administered into the renal collecting system FLUOROSCOPY TIME:  3 minutes, 24 seconds (22 mGy) COMPLICATIONS: None immediate. PROCEDURE: The procedure, risks, benefits, and alternatives were explained to the patient. Questions regarding the procedure were encouraged and answered. The patient understands and consents to the procedure. A timeout was performed prior to the initiation of the procedure. The right flank region was prepped and draped in the usual sterile fashion and a sterile drape was applied covering the operative field. A sterile gown and sterile gloves were used for the procedure. Local anesthesia was provided with 1% Lidocaine with epinephrine. Ultrasound was used to localize the right kidney. Attempts were made to access a posterior calyx with ultrasound guidance however this proved difficult secondary to angulation of kidney as well as patient's positioning on the fluoroscopy table. Fluoroscopic imaging demonstrated contrast from preceding abdominal CT within the right renal collecting system. As such, a posteroinferior calyx was targeted fluoroscopically with a 20 gauge needle. Access to the collecting system was confirmed with advancement of Nitrex wire to the level the right renal pelvis. Over a Nitrex wire, the tract was dilated with an Accustick stent. Next, under intermittent fluoroscopic guidance and over a short Amplatz wire, the track was dilated ultimately allowing placement of a 10-French percutaneous nephrostomy catheter which was advanced to the level of the renal pelvis where the coil was formed and locked. Contrast was injected and several spot fluoroscopic images were obtained in various obliquities. The catheter was secured at the skin with a Prolene retention suture and stat lock device and connected to a gravity bag was placed. Dressings were applied. The patient tolerated procedure  well without immediate postprocedural complication. FINDINGS: Ultrasound scanning demonstrates a mildly dilated right renal collecting system. Under fluoroscopic guidance, a contrast opacified posteroinferior calyx was targeted allowing placement of a 10-French percutaneous nephrostomy catheter with end coiled and locked within the renal pelvis. Contrast injection confirmed appropriate positioning. IMPRESSION: Successful ultrasound and fluoroscopic guided placement of a right sided 10 French PCN. Electronically Signed   By: Simonne Come M.D.   On: 12/27/2019 21:18     Scheduled Meds: . heparin injection (subcutaneous)  5,000 Units Subcutaneous Q8H  . pantoprazole (PROTONIX) IV  40 mg Intravenous Q24H  . sodium chloride flush  5 mL Intracatheter Q8H   Continuous Infusions: . piperacillin-tazobactam (ZOSYN)  IV 3.375  g (12/29/19 0545)     LOS: 2 days    Time spent: 72  Rhetta Mura, MD [facility] To contact the attending provider between 7A-7P or the covering provider during after hours 7P-7A, please log into the web site [email or web address] and access using universal [place] password for that web site. If you do not have the password, please call the hospital operator.  12/29/2019, 9:18 AM

## 2019-12-30 LAB — CBC WITH DIFFERENTIAL/PLATELET
Abs Immature Granulocytes: 0.07 10*3/uL (ref 0.00–0.07)
Basophils Absolute: 0.1 10*3/uL (ref 0.0–0.1)
Basophils Relative: 0 %
Eosinophils Absolute: 0.1 10*3/uL (ref 0.0–0.5)
Eosinophils Relative: 1 %
HCT: 28.7 % — ABNORMAL LOW (ref 36.0–46.0)
Hemoglobin: 9.1 g/dL — ABNORMAL LOW (ref 12.0–15.0)
Immature Granulocytes: 1 %
Lymphocytes Relative: 15 %
Lymphs Abs: 2 10*3/uL (ref 0.7–4.0)
MCH: 28.8 pg (ref 26.0–34.0)
MCHC: 31.7 g/dL (ref 30.0–36.0)
MCV: 90.8 fL (ref 80.0–100.0)
Monocytes Absolute: 0.7 10*3/uL (ref 0.1–1.0)
Monocytes Relative: 5 %
Neutro Abs: 10.8 10*3/uL — ABNORMAL HIGH (ref 1.7–7.7)
Neutrophils Relative %: 78 %
Platelets: 213 10*3/uL (ref 150–400)
RBC: 3.16 MIL/uL — ABNORMAL LOW (ref 3.87–5.11)
RDW: 13.7 % (ref 11.5–15.5)
WBC: 13.8 10*3/uL — ABNORMAL HIGH (ref 4.0–10.5)
nRBC: 0 % (ref 0.0–0.2)

## 2019-12-30 LAB — COMPREHENSIVE METABOLIC PANEL
ALT: 19 U/L (ref 0–44)
AST: 19 U/L (ref 15–41)
Albumin: 2.1 g/dL — ABNORMAL LOW (ref 3.5–5.0)
Alkaline Phosphatase: 68 U/L (ref 38–126)
Anion gap: 11 (ref 5–15)
BUN: 23 mg/dL (ref 8–23)
CO2: 21 mmol/L — ABNORMAL LOW (ref 22–32)
Calcium: 8.4 mg/dL — ABNORMAL LOW (ref 8.9–10.3)
Chloride: 107 mmol/L (ref 98–111)
Creatinine, Ser: 1.05 mg/dL — ABNORMAL HIGH (ref 0.44–1.00)
GFR calc Af Amer: 55 mL/min — ABNORMAL LOW (ref >60)
GFR calc non Af Amer: 47 mL/min — ABNORMAL LOW (ref >60)
Glucose, Bld: 104 mg/dL — ABNORMAL HIGH (ref 70–99)
Potassium: 3.5 mmol/L (ref 3.5–5.1)
Sodium: 139 mmol/L (ref 135–145)
Total Bilirubin: 1.1 mg/dL (ref 0.3–1.2)
Total Protein: 5.1 g/dL — ABNORMAL LOW (ref 6.5–8.1)

## 2019-12-30 LAB — MAGNESIUM: Magnesium: 1.8 mg/dL (ref 1.7–2.4)

## 2019-12-30 LAB — CULTURE, BLOOD (ROUTINE X 2): Special Requests: ADEQUATE

## 2019-12-30 MED ORDER — PANTOPRAZOLE SODIUM 40 MG PO TBEC
40.0000 mg | DELAYED_RELEASE_TABLET | Freq: Every day | ORAL | Status: DC
  Administered 2019-12-30 – 2020-01-02 (×4): 40 mg via ORAL
  Filled 2019-12-30 (×4): qty 1

## 2019-12-30 MED ORDER — AMIODARONE HCL 200 MG PO TABS
200.0000 mg | ORAL_TABLET | Freq: Two times a day (BID) | ORAL | Status: DC
  Administered 2019-12-30 – 2019-12-31 (×3): 200 mg via ORAL
  Filled 2019-12-30 (×3): qty 1

## 2019-12-30 MED ORDER — NEBIVOLOL HCL 5 MG PO TABS
5.0000 mg | ORAL_TABLET | Freq: Every day | ORAL | Status: DC
  Administered 2019-12-30 – 2020-01-02 (×4): 5 mg via ORAL
  Filled 2019-12-30 (×4): qty 1

## 2019-12-30 MED ORDER — SODIUM CHLORIDE 0.9 % IV SOLN
1.0000 g | Freq: Three times a day (TID) | INTRAVENOUS | Status: DC
  Administered 2019-12-30 – 2020-01-03 (×12): 1 g via INTRAVENOUS
  Filled 2019-12-30 (×14): qty 1000

## 2019-12-30 NOTE — Evaluation (Signed)
Physical Therapy Evaluation Patient Details Name: [name] MCGURK MRN: [id number] DOB: [birth year] Today's Date: 12/30/2019   History of Present Illness  86yo female presenting from SNF minimally responsive and with reported complaints of SOB, N/V, diarrhea, CP and with multiple concerns for SIRS with multiorgan dysfunction, imaging with R hydronephrosis near the UPJ. Received nephrostomy 8/1. Covid testing ultimately negative. Admitted with SIRS,  metabolic encephalopahty. PMH CAD s/p CABG, CHF, cerebrovascular disease, hx of T8 and L1 fractures, HTN, HLD, LBP, CVA, cardiac cath, hx partial colectomy, L knee surgery  Clinical Impression   Patient received in bed, very confused but pleasant. See below for mobility/assist levels. Cries out in pain to even the slightest gentle touch and in general needed heavy totalA of 1-2 for all mobility, unable to even get to EOB due to pain and physical resistance today. Suspect that she may be near her baseline however family not present to confirm/deny or clarify. Did have 30 second run of tachycardia to 122BPM when attempting bed mobility but quickly recovered, vitals WNL otherwise. Left positioned to comfort with all needs met, bed alarm active. Recommend return to SNF once deemed medically ready for DC by MD.     Follow Up Recommendations SNF;Supervision/Assistance - 24 hour    Equipment Recommendations  None recommended by PT (defer to next venue)    Recommendations for Other Services       Precautions / Restrictions Precautions Precautions: Fall;Other (comment) Precaution Comments: watch HR Restrictions Weight Bearing Restrictions: No      Mobility  Bed Mobility Overal bed mobility: Needs Assistance Bed Mobility: Rolling;Supine to Sit;Sit to Supine Rolling: Total assist   Supine to sit: +2 for physical assistance Sit to supine: +2 for physical assistance   General bed mobility comments: heavy totalAx1 for rolling, attempted to get to EOB  however only got partially there before stopping due to pain/physical resistance  Transfers                 General transfer comment: deferred- pain and resistance  Ambulation/Gait             General Gait Details: deferred- pain and resistance  Stairs            Wheelchair Mobility    Modified Rankin (Stroke Patients Only)       Balance Overall balance assessment: Needs assistance Sitting-balance support: No upper extremity supported;Feet unsupported Sitting balance-Leahy Scale: Poor                                       Pertinent Vitals/Pain Pain Assessment: Faces Faces Pain Scale: Hurts worst Pain Location: all over Pain Descriptors / Indicators: Guarding;Grimacing Pain Intervention(s): Limited activity within patient's tolerance;Monitored during session;Repositioned    Home Living Family/patient expects to be discharged to:: Skilled nursing facility                 Additional Comments: very poor historian- eventually able to state that she is not allowed to get out of bed by herself at [facility] and she needs a lot of help to transfer    Prior Function Level of Independence: Needs assistance   Gait / Transfers Assistance Needed: unclear- just states she needs a lot of help  ADL's / Homemaking Assistance Needed: unclear        Hand Dominance        Extremity/Trunk Assessment   Upper Extremity Assessment  Upper Extremity Assessment: Generalized weakness    Lower Extremity Assessment Lower Extremity Assessment: Generalized weakness    Cervical / Trunk Assessment Cervical / Trunk Assessment: Kyphotic  Communication   Communication: No difficulties  Cognition Arousal/Alertness: Awake/alert Behavior During Therapy: Flat affect;Anxious Overall Cognitive Status: History of cognitive impairments - at baseline                                 General Comments: dementia at baseline and very poor  historian- quickly becomes resistive and cries out in pain at the slightest touch and difficult to redirect once she feels she is hurting      General Comments General comments (skin integrity, edema, etc.): 30 second run of tachycardia to 122BPM, quickly recovered to 100BPM    Exercises     Assessment/Plan    PT Assessment Patient needs continued PT services  PT Problem List Decreased strength;Decreased cognition;Decreased activity tolerance;Decreased safety awareness;Decreased balance;Decreased mobility;Decreased coordination       PT Treatment Interventions DME instruction;Balance training;Gait training;Stair training;Cognitive remediation;Functional mobility training;Patient/family education;Therapeutic activities;Therapeutic exercise    PT Goals (Current goals can be found in the Care Plan section)  Acute Rehab PT Goals PT Goal Formulation: Patient unable to participate in goal setting Time For Goal Achievement: 01/13/20 Potential to Achieve Goals: Fair    Frequency Min 2X/week   Barriers to discharge        Co-evaluation               AM-PAC PT "6 Clicks" Mobility  Outcome Measure Help needed turning from your back to your side while in a flat bed without using bedrails?: A Lot Help needed moving from lying on your back to sitting on the side of a flat bed without using bedrails?: Total Help needed moving to and from a bed to a chair (including a wheelchair)?: Total Help needed standing up from a chair using your arms (e.g., wheelchair or bedside chair)?: Total Help needed to walk in hospital room?: Total Help needed climbing 3-5 steps with a railing? : Total 6 Click Score: 7    End of Session   Activity Tolerance: Patient limited by pain Patient left: in bed;with call bell/phone within reach;with bed alarm set Nurse Communication: Mobility status;Need for lift equipment PT Visit Diagnosis: Muscle weakness (generalized) (M62.81);Unsteadiness on feet  (R26.81);Difficulty in walking, not elsewhere classified (R26.2);Adult, failure to thrive (R62.7)    Time: 6153-7943 PT Time Calculation (min) (ACUTE ONLY): 23 min   Charges:   PT Evaluation $PT Eval High Complexity: 1 High PT Treatments $Therapeutic Activity: 8-22 mins        Windell Norfolk, DPT, PN1   Supplemental Physical Therapist [place]    Pager [phone] Acute Rehab Office [phone]

## 2019-12-30 NOTE — Progress Notes (Signed)
PROGRESS NOTE    [name]  QZE:[id number] DOB: [birth year] DOA: 12/27/2019 PCP: Donita Brooks, MD      Brief Narrative:  [name] is a 86 y.o. F with CAD status post CABG 34, D CHF, HTN, PVD status post CEA, CVA, PAF, and asthma who presented with decreased mentation and lethargy and confusion.  Found to have Proteus bacteremia.           Assessment & Plan:  Severe sepsis due to UTI/Proteus bacteremia, due to nephrolithiasis with hydronephrosis requiring nephrostomy tube placement Status post nephrostomy tube placement 8/1 by interventional radiology Presented with tachycardia, leukocytosis, fever, suspected UTI, and evidence of endorgan failure new (qSOFA 1 for encephalopathy).  Cultures now showing beta-lactam sensitive Proteus -Narrowed to ampicillin   Paroxysmal atrial fibrillation Heart rate now normalized.  Not on anticoagulation due to frequent falls. -Continue amiodarone -Add nebivolol -Consult cardiology, appreciate cares  Acute metabolic encephalopathy This is improving.  Because likely sepsis.  Acute kidney injury due to sepsis Renal function improving.  Hypomagnesemia Magnesium resolved  Elevated troponin Doubt ACS  Chronic diastolic CHF Appears euvolemic  Anemia Hemoglobin stable today  Severe protein calorie malnutrition  Cerebrovascular disease Peripheral vascular disease Hypertension -Hold aspirin, Imdur, Crestor  GERD -Continue PPI  Mood disorder -Resume Lexapro at discharge       Disposition: Status is: Inpatient  Remains inpatient appropriate because:IV treatments appropriate due to intensity of illness or inability to take PO   Dispo:  Patient From: Skilled Nursing Facility  Planned Disposition: Skilled Nursing Facility  Expected discharge date: 01/01/20  Medically stable for discharge: No               MDM: The below labs and imaging reports were reviewed and summarized above.  Medication  management as above.     DVT prophylaxis: heparin injection 5,000 Units Start: 12/27/19 2200  Code Status: DNR Family Communication:     Consultants:   Cardiology  Procedures:     Antimicrobials:      Culture data:              Subjective: Patient is feeling weak and tired, she is quite ill.  No fever overnight.  No vomiting.  Confusion is improving.  Objective: Vitals:   12/30/19 0802 12/30/19 1229 12/30/19 1622 12/30/19 1938  BP: (!) 140/111 (!) 126/51 (!) 114/56 (!) 124/57  Pulse: 61 62 64 61  Resp: 17 19 20 18   Temp: 98.8 F (37.1 C) 98.7 F (37.1 C) 97.8 F (36.6 C) (!) 97.4 F (36.3 C)  TempSrc: Oral Oral  Oral  SpO2: 95% 95% 97%   Weight:      Height:        Intake/Output Summary (Last 24 hours) at 12/30/2019 1953 Last data filed at 12/30/2019 1540 Gross per 24 hour  Intake 320 ml  Output 1620 ml  Net -1300 ml   Filed Weights   12/27/19 1402  Weight: 48.6 kg    Examination: General appearance: Chronically ill-appearing elderly adult female, a awake but tired and sluggish, in no obvious distress.   HEENT: Anicteric, conjunctiva pink, lids and lashes normal. No nasal deformity, discharge, epistaxis.  Lip cracked and dry.   Skin: Warm and dry.  No jaundice.  No suspicious rashes or lesions. Cardiac: RRR, nl S1-S2, no murmurs appreciated.  Capillary refill is brisk.  JVP not visible.  No LE edema.  Radial pulses 2+ and symmetric. Respiratory: Normal respiratory rate and rhythm.  CTAB without rales  or wheezes. Abdomen: Abdomen soft.  Mild right sided TTP with right flank pain near nephrostomy tube. No ascites, distension, hepatosplenomegaly.   MSK: No deformities or effusions. Neuro: Awake and alert but tired and sluggish.  EOMI, moves all extremities with severe weakness. Speech fluent.    Psych: Sensorium intact and responding to questions, attention normal. Affect blunted.  Judgment and insight appear normal.    Data Reviewed: I have  personally reviewed following labs and imaging studies:  CBC: Recent Labs  Lab 12/27/19 1301 12/27/19 2009 12/28/19 0421 12/29/19 0207 12/30/19 0223  WBC 21.7*  --  25.3* 19.9* 13.8*  NEUTROABS 20.7*  --  22.7* 16.9* 10.8*  HGB 11.8* 10.9* 8.6* 9.3* 9.1*  HCT 38.0 32.0* 28.9* 30.1* 28.7*  MCV 93.1  --  99.3 95.0 90.8  PLT 304  --  188 204 213   Basic Metabolic Panel: Recent Labs  Lab 12/27/19 1301 12/27/19 2009 12/28/19 0421 12/29/19 0207 12/30/19 0223  NA 140 139 137 140 139  K 3.8 4.1 4.2 3.7 3.5  CL 101  --  106 105 107  CO2 25  --  16* 22 21*  GLUCOSE 142*  --  72 74 104*  BUN 24*  --  15 23 23   CREATININE 1.25*  --  0.73 1.12* 1.05*  CALCIUM 9.6  --  7.3* 8.5* 8.4*  MG  --   --  0.9* 2.3 1.8   GFR: Estimated Creatinine Clearance: 28.4 mL/min (A) (by C-G formula based on SCr of 1.05 mg/dL (H)). Liver Function Tests: Recent Labs  Lab 12/27/19 1301 12/28/19 0421 12/30/19 0223  AST 111* 28 19  ALT 61* 23 19  ALKPHOS 112 48 68  BILITOT 1.2 0.5 1.1  PROT 6.2* 3.3* 5.1*  ALBUMIN 2.9* 1.4* 2.1*   Recent Labs  Lab 12/27/19 1301  LIPASE 20   No results for input(s): AMMONIA in the last 168 hours. Coagulation Profile: No results for input(s): INR, PROTIME in the last 168 hours. Cardiac Enzymes: No results for input(s): CKTOTAL, CKMB, CKMBINDEX, TROPONINI in the last 168 hours. BNP (last 3 results) No results for input(s): PROBNP in the last 8760 hours. HbA1C: No results for input(s): HGBA1C in the last 72 hours. CBG: Recent Labs  Lab 12/27/19 1255  GLUCAP 130*   Lipid Profile: No results for input(s): CHOL, HDL, LDLCALC, TRIG, CHOLHDL, LDLDIRECT in the last 72 hours. Thyroid Function Tests: No results for input(s): TSH, T4TOTAL, FREET4, T3FREE, THYROIDAB in the last 72 hours. Anemia Panel: No results for input(s): VITAMINB12, FOLATE, FERRITIN, TIBC, IRON, RETICCTPCT in the last 72 hours. Urine analysis:    Component Value Date/Time   COLORURINE  AMBER (A) 12/27/2019 1720   APPEARANCEUR CLOUDY (A) 12/27/2019 1720   LABSPEC 1.015 12/27/2019 1720   PHURINE 8.0 12/27/2019 1720   GLUCOSEU NEGATIVE 12/27/2019 1720   HGBUR MODERATE (A) 12/27/2019 1720   BILIRUBINUR SMALL (A) 12/27/2019 1720   KETONESUR NEGATIVE 12/27/2019 1720   PROTEINUR 100 (A) 12/27/2019 1720   UROBILINOGEN 0.2 01/15/2014 2032   NITRITE NEGATIVE 12/27/2019 1720   LEUKOCYTESUR MODERATE (A) 12/27/2019 1720   Sepsis Labs: @LABRCNTIP (procalcitonin:4,lacticacidven:4)  ) Recent Results (from the past 240 hour(s))  Blood Culture (routine x 2)     Status: Abnormal   Collection Time: 12/27/19  1:01 PM   Specimen: BLOOD  Result Value Ref Range Status   Specimen Description BLOOD SITE NOT SPECIFIED  Final   Special Requests   Final    BOTTLES DRAWN AEROBIC  AND ANAEROBIC Blood Culture adequate volume   Culture  Setup Time   Final    GRAM NEGATIVE RODS Organism ID to follow CRITICAL RESULT CALLED TO, READ BACK BY AND VERIFIED WITH: A. Artis Flock PharmD 15:00 12/28/19 ([email or web address]) Performed at [facility], [address] [place], [place] [address]    Culture PROTEUS MIRABILIS (A)  Final   Report Status 12/30/2019 FINAL  Final   Organism ID, Bacteria PROTEUS MIRABILIS  Final      Susceptibility   Proteus mirabilis - MIC*    AMPICILLIN <=2 SENSITIVE Sensitive     CEFAZOLIN <=4 SENSITIVE Sensitive     CEFEPIME <=0.12 SENSITIVE Sensitive     CEFTAZIDIME <=1 SENSITIVE Sensitive     CEFTRIAXONE <=0.25 SENSITIVE Sensitive     CIPROFLOXACIN >=4 RESISTANT Resistant     GENTAMICIN <=1 SENSITIVE Sensitive     IMIPENEM 2 SENSITIVE Sensitive     TRIMETH/SULFA >=320 RESISTANT Resistant     AMPICILLIN/SULBACTAM <=2 SENSITIVE Sensitive     PIP/TAZO <=4 SENSITIVE Sensitive     * PROTEUS MIRABILIS  Blood Culture ID Panel (Reflexed)     Status: Abnormal   Collection Time: 12/27/19  1:01 PM  Result Value Ref Range Status   Enterococcus species NOT DETECTED NOT DETECTED Final    Listeria monocytogenes NOT DETECTED NOT DETECTED Final   Staphylococcus species NOT DETECTED NOT DETECTED Final   Staphylococcus aureus (BCID) NOT DETECTED NOT DETECTED Final   Streptococcus species NOT DETECTED NOT DETECTED Final   Streptococcus agalactiae NOT DETECTED NOT DETECTED Final   Streptococcus pneumoniae NOT DETECTED NOT DETECTED Final   Streptococcus pyogenes NOT DETECTED NOT DETECTED Final   Acinetobacter baumannii NOT DETECTED NOT DETECTED Final   Enterobacteriaceae species DETECTED (A) NOT DETECTED Final    Comment: Joretta Bachelor PharmD 1500 12/28/19 ([email or web address]) Enterobacteriaceae represent a large family of gram-negative bacteria, not a single organism.    Enterobacter cloacae complex NOT DETECTED NOT DETECTED Final   Escherichia coli NOT DETECTED NOT DETECTED Final   Klebsiella oxytoca NOT DETECTED NOT DETECTED Final   Klebsiella pneumoniae NOT DETECTED NOT DETECTED Final   Proteus species DETECTED (A) NOT DETECTED Final    Comment: CRITICAL RESULT CALLED TO, READ BACK BY AND VERIFIED WITH: A. Artis Flock PharmD 15:00 12/28/19 ([email or web address])    Serratia marcescens NOT DETECTED NOT DETECTED Final   Haemophilus influenzae NOT DETECTED NOT DETECTED Final   Neisseria meningitidis NOT DETECTED NOT DETECTED Final   Pseudomonas aeruginosa NOT DETECTED NOT DETECTED Final   Candida albicans NOT DETECTED NOT DETECTED Final   Candida glabrata NOT DETECTED NOT DETECTED Final   Candida krusei NOT DETECTED NOT DETECTED Final   Candida parapsilosis NOT DETECTED NOT DETECTED Final   Candida tropicalis NOT DETECTED NOT DETECTED Final    Comment: Performed at [facility], [address], [place], [place] [address]  SARS Coronavirus 2 by RT PCR (hospital order, performed in [facility] hospital lab) Nasopharyngeal Nasopharyngeal Swab     Status: None   Collection Time: 12/27/19  1:25 PM   Specimen: Nasopharyngeal Swab  Result Value Ref Range Status   SARS Coronavirus 2 NEGATIVE NEGATIVE Final     Comment: (NOTE) SARS-CoV-2 target nucleic acids are NOT DETECTED.  The SARS-CoV-2 RNA is generally detectable in upper and lower respiratory specimens during the acute phase of infection. The lowest concentration of SARS-CoV-2 viral copies this assay can detect is 250 copies / mL. A negative result does not preclude SARS-CoV-2 infection and  should not be used as the sole basis for treatment or other patient management decisions.  A negative result may occur with improper specimen collection / handling, submission of specimen other than nasopharyngeal swab, presence of viral mutation(s) within the areas targeted by this assay, and inadequate number of viral copies (<250 copies / mL). A negative result must be combined with clinical observations, patient history, and epidemiological information.  Fact Sheet for Patients:   BoilerBrush.com.cy  Fact Sheet for Healthcare Providers: https://pope.com/  This test is not yet approved or  cleared by the [place] FDA and has been authorized for detection and/or diagnosis of SARS-CoV-2 by FDA under an Emergency Use Authorization (EUA).  This EUA will remain in effect (meaning this test can be used) for the duration of the COVID-19 declaration under Section 564(b)(1) of the Act, 21 U.S.C. section 360bbb-3(b)(1), unless the authorization is terminated or revoked sooner.  Performed at [facility], [address], [place], [place] [address]   Urine culture     Status: Abnormal   Collection Time: 12/27/19  5:31 PM   Specimen: Urine, Clean Catch  Result Value Ref Range Status   Specimen Description URINE, CLEAN CATCH  Final   Special Requests   Final    NONE Performed at [facility], [address] [place], [place] [address]    Culture MULTIPLE SPECIES PRESENT, SUGGEST RECOLLECTION (A)  Final   Report Status 12/28/2019 FINAL  Final         Radiology Studies: No results  found.      Scheduled Meds: . amiodarone  200 mg Oral BID  . feeding supplement (ENSURE ENLIVE)  237 mL Oral BID BM  . heparin injection (subcutaneous)  5,000 Units Subcutaneous Q8H  . multivitamin with minerals  1 tablet Oral Daily  . nebivolol  5 mg Oral Daily  . pantoprazole  40 mg Oral QHS  . sodium chloride flush  5 mL Intracatheter Q8H   Continuous Infusions: . sodium chloride    . ampicillin (OMNIPEN) IV 1 g (12/30/19 1547)     LOS: 3 days    Time spent: 35 minutes    Alberteen Sam, MD [facility] 12/30/2019, 7:53 PM     Please page though AMION or Epic secure chat:  For [employer], Higher education careers adviser

## 2019-12-30 NOTE — Plan of Care (Signed)

## 2019-12-30 NOTE — Progress Notes (Signed)
Progress Note  Patient Name: [name] Date of Encounter: 12/30/2019  [facility] Cardiologist: Kristeen Miss, MD   Subjective   Reports intermittent dyspnea.  Oriented to person and place  Inpatient Medications    Scheduled Meds:  amiodarone  200 mg Oral BID   feeding supplement (ENSURE ENLIVE)  237 mL Oral BID BM   heparin injection (subcutaneous)  5,000 Units Subcutaneous Q8H   multivitamin with minerals  1 tablet Oral Daily   nebivolol  5 mg Oral Daily   pantoprazole (PROTONIX) IV  40 mg Intravenous Q24H   sodium chloride flush  5 mL Intracatheter Q8H   Continuous Infusions:  sodium chloride     piperacillin-tazobactam (ZOSYN)  IV 3.375 g (12/30/19 0356)   PRN Meds: acetaminophen **OR** acetaminophen, albuterol, hydrALAZINE, ondansetron **OR** ondansetron (ZOFRAN) IV   Vital Signs    Vitals:   12/30/19 0218 12/30/19 0435 12/30/19 0600 12/30/19 0802  BP: (!) 106/50 (!) 103/52 (!) 136/57 (!) 140/111  Pulse: 63  65 61  Resp: 18  18 17   Temp: 98.6 F (37 C) (!) 97.5 F (36.4 C) 98.8 F (37.1 C) 98.8 F (37.1 C)  TempSrc: Axillary Oral Oral Oral  SpO2: 94%  95% 95%  Weight:      Height:        Intake/Output Summary (Last 24 hours) at 12/30/2019 1014 Last data filed at 12/30/2019 0321 Gross per 24 hour  Intake 429.83 ml  Output 1170 ml  Net -740.17 ml   Last 3 Weights 12/27/2019 11/21/2018 02/03/2018  Weight (lbs) 107 lb 3.2 oz 132 lb 4.4 oz 134 lb  Weight (kg) 48.626 kg 60 kg 60.782 kg      Telemetry    Sinus rhythm in 60s- Personally Reviewed  ECG    No new ECG- Personally Reviewed  Physical Exam   GEN: No acute distress.   Neck: +JVD Cardiac: RRR, no murmurs, rubs, or gallops.  Respiratory: Clear to auscultation bilaterally. GI: Soft, nontender, non-distended  MS: No edema; No deformity. Neuro:  Nonfocal  Psych: Normal affect   Labs    High Sensitivity Troponin:   Recent Labs  Lab 12/27/19 1301 12/27/19 1458 12/27/19 2330    TROPONINIHS 188* 218* 126*      Chemistry Recent Labs  Lab 12/27/19 1301 12/27/19 2009 12/28/19 0421 12/29/19 0207 12/30/19 0223  NA 140   < > 137 140 139  K 3.8   < > 4.2 3.7 3.5  CL 101  --  106 105 107  CO2 25  --  16* 22 21*  GLUCOSE 142*  --  72 74 104*  BUN 24*  --  15 23 23   CREATININE 1.25*  --  0.73 1.12* 1.05*  CALCIUM 9.6  --  7.3* 8.5* 8.4*  PROT 6.2*  --  3.3*  --  5.1*  ALBUMIN 2.9*  --  1.4*  --  2.1*  AST 111*  --  28  --  19  ALT 61*  --  23  --  19  ALKPHOS 112  --  48  --  68  BILITOT 1.2  --  0.5  --  1.1  GFRNONAA 38*  --  >60 44* 47*  GFRAA 44*  --  >60 51* 55*  ANIONGAP 14  --  15 13 11    < > = values in this interval not displayed.     Hematology Recent Labs  Lab 12/28/19 0421 12/29/19 0207 12/30/19 0223  WBC 25.3*  19.9* 13.8*  RBC 2.91* 3.17* 3.16*  HGB 8.6* 9.3* 9.1*  HCT 28.9* 30.1* 28.7*  MCV 99.3 95.0 90.8  MCH 29.6 29.3 28.8  MCHC 29.8* 30.9 31.7  RDW 13.9 13.9 13.7  PLT 188 204 213    BNPNo results for input(s): BNP, PROBNP in the last 168 hours.   DDimer  Recent Labs  Lab 12/27/19 1301  DDIMER 6.42*     Radiology    ECHOCARDIOGRAM COMPLETE  Result Date: 12/28/2019    ECHOCARDIOGRAM REPORT   Patient Name:   [name] Date of Exam: 12/28/2019 Medical Rec #:  [id number]   Height:       64.0 in Accession #:    [id number]  Weight:       107.2 lb Date of Birth:  [birth year]   BSA:          1.501 m Patient Age:    86 years    BP:           112/45 mmHg Patient Gender: F           HR:           80 bpm. Exam Location:  Inpatient Procedure: 2D Echo Indications:    428.31; I50.31 Acute diastolic (congestive) heart failure  History:        Patient has prior history of Echocardiogram examinations, most                 recent 07/20/2015. CHF, CAD, Prior CABG, Stroke; Risk                 Factors:Hypertension and Dyslipidemia.  Sonographer:    Celene Skeen RDCS (AE) Referring Phys: [id number] Deno Lunger [facility]  Sonographer Comments: No apical  window. restricted mobility IMPRESSIONS  1. No apical images obtained to evaluate diastolic function. This study is limited for diagnostic assessment.  2. Left ventricular ejection fraction, by estimation, is 65 to 70%. The left ventricle has normal function. Left ventricular endocardial border not optimally defined to evaluate regional wall motion. There is mild asymmetric left ventricular hypertrophy  of the septal segment. Left ventricular diastolic function could not be evaluated.  3. Right ventricular systolic function is normal. The right ventricular size is normal. There is mildly elevated pulmonary artery systolic pressure. The estimated right ventricular systolic pressure is 43.1 mmHg.  4. The mitral valve is degenerative. Trivial mitral valve regurgitation.  5. The tricuspid valve is degenerative. Tricuspid valve regurgitation is mild to moderate.  6. The aortic valve is abnormal. AV is incompletely assessed. FINDINGS  Left Ventricle: Left ventricular ejection fraction, by estimation, is 65 to 70%. The left ventricle has normal function. Left ventricular endocardial border not optimally defined to evaluate regional wall motion. The left ventricular internal cavity size was normal in size. There is mild asymmetric left ventricular hypertrophy of the septal segment. Left ventricular diastolic function could not be evaluated. Right Ventricle: The right ventricular size is normal. Right vetricular wall thickness was not assessed. Right ventricular systolic function is normal. There is mildly elevated pulmonary artery systolic pressure. The tricuspid regurgitant velocity is 2.65 m/s, and with an assumed right atrial pressure of 15 mmHg, the estimated right ventricular systolic pressure is 43.1 mmHg. Left Atrium: Left atrial size was not assessed. Right Atrium: Right atrial size was not assessed. Pericardium: There is no evidence of pericardial effusion. Mitral Valve: The mitral valve is degenerative in  appearance. Moderate mitral annular calcification. Trivial mitral valve regurgitation. Tricuspid Valve: The tricuspid  valve is degenerative in appearance. Tricuspid valve regurgitation is mild to moderate. Aortic Valve: The aortic valve is abnormal. Aortic valve regurgitation is not visualized. There is moderate calcification of the aortic valve. Pulmonic Valve: The pulmonic valve was not well visualized. Pulmonic valve regurgitation is not visualized. Aorta: The aortic root is normal in size and structure. IAS/Shunts: The interatrial septum was not well visualized.  LEFT VENTRICLE PLAX 2D LVIDd:         3.10 cm LVIDs:         1.90 cm LV PW:         1.00 cm LV IVS:        1.20 cm LVOT diam:     2.00 cm LVOT Area:     3.14 cm  LEFT ATRIUM         Index LA diam:    3.40 cm 2.27 cm/m   AORTA Ao Root diam: 2.60 cm TRICUSPID VALVE TR Peak grad:   28.1 mmHg TR Vmax:        265.00 cm/s  SHUNTS Systemic Diam: 2.00 cm Weston Brass MD Electronically signed by Weston Brass MD Signature Date/Time: 12/28/2019/5:40:27 PM    Final     Cardiac Studies   TTE 12/28/19:  1. No apical images obtained to evaluate diastolic function. This study  is limited for diagnostic assessment.  2. Left ventricular ejection fraction, by estimation, is 65 to 70%. The  left ventricle has normal function. Left ventricular endocardial border  not optimally defined to evaluate regional wall motion. There is mild  asymmetric left ventricular hypertrophy  of the septal segment. Left ventricular diastolic function could not be  evaluated.  3. Right ventricular systolic function is normal. The right ventricular  size is normal. There is mildly elevated pulmonary artery systolic  pressure. The estimated right ventricular systolic pressure is 43.1 mmHg.  4. The mitral valve is degenerative. Trivial mitral valve regurgitation.  5. The tricuspid valve is degenerative. Tricuspid valve regurgitation is  mild to moderate.  6. The  aortic valve is abnormal. AV is incompletely assessed.   Patient Profile     86 y.o. female with a PMH of CAD s/p CABG 1997 with redo in 2010, paroxysmal atrial fibrillation, ICM with improvement in EF, HTN, HLD, Carotid artery disease s/p right CEA 1995, Asthma, CVA 2009, and frequent falls, who is being seen for the evaluation of atrial fibrillation   Assessment & Plan    Paroxysmal atrial fibrillation: Went into AF with RVR on 8/30, was started on IV amiodarone and converted to sinus rhythm. -Not on anticoagulation due to frequent falls -Discontinue metoprolol, will restart home nebivolol at 5 mg daily -Will plan short course of amiodarone while recovering from acute illness, will not plan long-term use.  Will discontinue amiodarone drip and start amio 200 mg twice daily x2 weeks, then 200 mg daily x2 weeks, then discontinue  For questions or updates, please contact [facility] Please consult [email or web address] for contact info under        Signed, Little Ishikawa, MD  12/30/2019, 10:14 AM

## 2019-12-30 NOTE — Evaluation (Signed)
Clinical/Bedside Swallow Evaluation Patient Details  Name: [name] MRN: [id number] Date of Birth: [birth year]  Today's Date: 12/30/2019 Time: SLP Start Time (ACUTE ONLY): 0907 SLP Stop Time (ACUTE ONLY): 0923 SLP Time Calculation (min) (ACUTE ONLY): 16 min  Past Medical History:  Past Medical History:  Diagnosis Date  . Allergy   . Asthma   . CAD (coronary artery disease)    s/p CABG x 5 in 1997 & CABG x 3 in 2010; Last cath in April of 2010 showed grafts to be patent. EF is 40 to 45%  . CHF (congestive heart failure) (HCC)    EF is 40 to 45%, 06/2015 EF 55-60%  . Depression    following bypass surgery  . Diverticulosis   . Endometriosis   . GERD (gastroesophageal reflux disease)   . Hepatitis A   . Hiatal hernia   . History of cerebrovascular disease   . History of vertebral fracture    at T8  . Hyperlipemia   . Hypertension   . LBP (low back pain)   . Mixed incontinence urge and stress 04/22/2013  . Nocturia 04/22/2013  . Pleural effusion    post surgical  . PVD (peripheral vascular disease) (HCC)    S/P right CEA  . Sleep apnea   . Stroke [facility]) 2010   Past Surgical History:  Past Surgical History:  Procedure Laterality Date  . APPENDECTOMY    . CARDIAC CATHETERIZATION  April 2010   Grafts were patent. EF is 40 to 45%  . CAROTID ENDARTERECTOMY     right  . CORONARY ARTERY BYPASS GRAFT  [id number]   Original surgery in 1997 x 5; Redo CABG x 3 in 2010 including free RIMA to distal LAD, SVG to OM and SVG to left posterolateral  . INTRAMEDULLARY (IM) NAIL INTERTROCHANTERIC Right 11/19/2018   Procedure: INTRAMEDULLARY (IM) NAIL INTERTROCHANTRIC;  Surgeon: Yolonda Kida, MD;  Location: [facility] OR;  Service: Orthopedics;  Laterality: Right;  . IR NEPHROSTOMY PLACEMENT RIGHT  12/27/2019  . KNEE SURGERY     left  . LEFT HEART CATHETERIZATION WITH CORONARY/GRAFT ANGIOGRAM N/A 03/12/2013   Procedure: LEFT HEART CATHETERIZATION WITH Isabel Caprice;  Surgeon:  Peter M [place], MD;  Location[facility] CATH LAB;  Service: Cardiovascular;  Laterality: N/A;  . PARTIAL COLECTOMY     due to colon abcess   HPI:  Patchy interstitial airspace opacities throughout both lungs which   Assessment / Plan / Recommendation Clinical Impression  Mild oral and respiratory based dysphagia marked by labial spill and additional time needed to form lips around cup. There were no indications of compromised airway closure. Reluctant to take cracker but agreeable with functional mastication and trace residue. She prefers to remain on fine chopped texture (Dys 2), thin liquids, pills whole in puree. Allow pt to self feed as able. She was self aware and asked therapist for more time "need to catch my breath." Continue Dys 2, thin and can upgrade at SNF if pt desires. No further ST at present.  SLP Visit Diagnosis: Dysphagia, oral phase (R13.11)    Aspiration Risk  Mild aspiration risk    Diet Recommendation Dysphagia 2 (Fine chop);Thin liquid   Liquid Administration via: Cup;Straw Medication Administration: Whole meds with puree Supervision: Staff to assist with self feeding;Full supervision/cueing for compensatory strategies Compensations: Slow rate;Small sips/bites;Lingual sweep for clearance of pocketing Postural Changes: Seated upright at 90 degrees    Other  Recommendations Oral Care Recommendations: Oral care BID   Follow  up Recommendations Skilled Nursing facility      Frequency and Duration            Prognosis        Swallow Study   General Date of Onset: 12/27/19 HPI: Patchy interstitial airspace opacities throughout both lungs which Type of Study: Bedside Swallow Evaluation Previous Swallow Assessment:  (none) Diet Prior to this Study: Dysphagia 2 (chopped);Thin liquids Temperature Spikes Noted: No Respiratory Status: Room air History of Recent Intubation: No Behavior/Cognition: Alert;Cooperative;Pleasant mood;Requires cueing Oral Cavity Assessment:  Dry Oral Care Completed by SLP: Yes Oral Cavity - Dentition: Adequate natural dentition Vision: Functional for self-feeding Self-Feeding Abilities: Needs assist Patient Positioning: Upright in bed Baseline Vocal Quality: Normal Volitional Cough: Weak Volitional Swallow: Able to elicit    Oral/Motor/Sensory Function Overall Oral Motor/Sensory Function: Within functional limits   Ice Chips Ice chips: Not tested   Thin Liquid Thin Liquid: Impaired Presentation: Cup;Straw Oral Phase Impairments: Reduced labial seal Oral Phase Functional Implications: Left anterior spillage;Right anterior spillage Pharyngeal  Phase Impairments:  (none)    Nectar Thick Nectar Thick Liquid: Not tested   Honey Thick Honey Thick Liquid: Not tested   Puree Puree: Not tested   Solid     Solid: Within functional limits      Royce Macadamia 12/30/2019,9:38 AM  Breck Coons Lonell Face.Ed Nurse, children's [phone] Office [phone]

## 2019-12-31 LAB — COMPREHENSIVE METABOLIC PANEL
ALT: 17 U/L (ref 0–44)
AST: 17 U/L (ref 15–41)
Albumin: 2.2 g/dL — ABNORMAL LOW (ref 3.5–5.0)
Alkaline Phosphatase: 75 U/L (ref 38–126)
Anion gap: 10 (ref 5–15)
BUN: 17 mg/dL (ref 8–23)
CO2: 22 mmol/L (ref 22–32)
Calcium: 8.2 mg/dL — ABNORMAL LOW (ref 8.9–10.3)
Chloride: 109 mmol/L (ref 98–111)
Creatinine, Ser: 0.97 mg/dL (ref 0.44–1.00)
GFR calc Af Amer: >60 mL/min (ref >60)
GFR calc non Af Amer: 52 mL/min — ABNORMAL LOW (ref >60)
Glucose, Bld: 115 mg/dL — ABNORMAL HIGH (ref 70–99)
Potassium: 3.1 mmol/L — ABNORMAL LOW (ref 3.5–5.1)
Sodium: 141 mmol/L (ref 135–145)
Total Bilirubin: 0.5 mg/dL (ref 0.3–1.2)
Total Protein: 4.9 g/dL — ABNORMAL LOW (ref 6.5–8.1)

## 2019-12-31 LAB — CBC
HCT: 30.3 % — ABNORMAL LOW (ref 36.0–46.0)
Hemoglobin: 9.8 g/dL — ABNORMAL LOW (ref 12.0–15.0)
MCH: 29.3 pg (ref 26.0–34.0)
MCHC: 32.3 g/dL (ref 30.0–36.0)
MCV: 90.4 fL (ref 80.0–100.0)
Platelets: 220 10*3/uL (ref 150–400)
RBC: 3.35 MIL/uL — ABNORMAL LOW (ref 3.87–5.11)
RDW: 13.7 % (ref 11.5–15.5)
WBC: 10.8 10*3/uL — ABNORMAL HIGH (ref 4.0–10.5)
nRBC: 0 % (ref 0.0–0.2)

## 2019-12-31 MED ORDER — AMIODARONE HCL 200 MG PO TABS: 200.0000 mg | ORAL_TABLET | Freq: Every day | ORAL | Status: DC

## 2019-12-31 MED ORDER — POTASSIUM CHLORIDE CRYS ER 20 MEQ PO TBCR
40.0000 meq | EXTENDED_RELEASE_TABLET | Freq: Two times a day (BID) | ORAL | Status: AC
  Administered 2019-12-31 (×2): 40 meq via ORAL
  Filled 2019-12-31 (×2): qty 2

## 2019-12-31 MED ORDER — AMIODARONE HCL 200 MG PO TABS
200.0000 mg | ORAL_TABLET | Freq: Two times a day (BID) | ORAL | Status: DC
  Administered 2019-12-31 – 2020-01-02 (×5): 200 mg via ORAL
  Filled 2019-12-31 (×5): qty 1

## 2019-12-31 MED ORDER — ENOXAPARIN SODIUM 40 MG/0.4ML ~~LOC~~ SOLN
40.0000 mg | SUBCUTANEOUS | Status: DC
  Administered 2019-12-31 – 2020-01-02 (×3): 40 mg via SUBCUTANEOUS
  Filled 2019-12-31 (×3): qty 0.4

## 2019-12-31 NOTE — Progress Notes (Signed)
PROGRESS NOTE    [name]  KGU:[id number] DOB: Jan 05, 1929 DOA: 12/27/2019 PCP: Donita Brooks, MD      Brief Narrative:  Mrs. Broder is a 86 y.o. F with CAD status post CABG 26, D CHF, HTN, PVD status post CEA, CVA, PAF, and asthma who presented with decreased mentation and lethargy and confusion.  Found to have Proteus bacteremia.           Assessment & Plan:  Sepsis due to UTI/Proteus bacteremia, due to nephrolithiasis with hydronephrosis requiring nephrostomy tube placement Status post nephrostomy tube placement 8/1 by interventional radiology Presented with tachycardia, leukocytosis, fever, suspected UTI, and evidence of endorgan failure new (qSOFA 1 for encephalopathy).  Cultures now showing beta-lactam sensitive Proteus -Continue ampicillin   Paroxysmal atrial fibrillation Heart rate now normalized.  Not on anticoagulation due to frequent falls. -Continue amiodarone and nadolol -Consult cardiology, appreciate cares  Acute metabolic encephalopathy Due to sepsis, slowly improving  Acute kidney injury due to sepsis Renal function improving.  Hypomagnesemia Magnesium resolved  Hypokalemia -Supplement K  Elevated troponin Doubt ACS, likely demand ischemia  Chronic diastolic CHF Appears euvolemic  Anemia Hgb stable, no clinical bleeding  Severe protein calorie malnutrition  Cerebrovascular disease Peripheral vascular disease Hypertension BP soft -Hold aspirin, Imdur, Crestor  GERD -Continue PPI  Mood disorder -Resume Lexapro at discharge   Dysphagia DYS 2 diet     Disposition: Status is: Inpatient  Remains inpatient appropriate because:IV treatments appropriate due to intensity of illness or inability to take PO   Dispo:  Patient From: Skilled Nursing Facility  Planned Disposition: Skilled Nursing Facility  Expected discharge date: 01/01/20  Medically stable for discharge: No               MDM: The below labs  and imaging reports were reviewed and summarized above.  Medication management as above.     DVT prophylaxis: enoxaparin (LOVENOX) injection 40 mg Start: 12/31/19 1330  Code Status: DNR Family Communication:     Consultants:   Cardiology Urology           Subjective: Patient is still weak and tired, but improving.  No vomiting, no respiratory distress, no fever.     Objective: Vitals:   12/30/19 2116 12/31/19 0500 12/31/19 0724 12/31/19 1603  BP:   114/61 (!) 114/54  Pulse:   69 64  Resp:   18 18  Temp:   98.7 F (37.1 C) 97.8 F (36.6 C)  TempSrc:      SpO2:   93% 94%  Weight: 55.8 kg 56.9 kg    Height:        Intake/Output Summary (Last 24 hours) at 12/31/2019 1721 Last data filed at 12/31/2019 1600 Gross per 24 hour  Intake 1774.16 ml  Output 370 ml  Net 1404.16 ml   Filed Weights   12/27/19 1402 12/30/19 2116 12/31/19 0500  Weight: 48.6 kg 55.8 kg 56.9 kg    Examination: General appearance: Chronically ill-appearing elderly adult female, a awake but tired and sluggish, in no obvious distress.   HEENT: Anicteric, conjunctiva pink, lids and lashes normal. No nasal deformity, discharge, epistaxis.  Lip cracked and dry.   Skin: Warm and dry.  No jaundice.  No suspicious rashes or lesions. Cardiac: RRR, nl S1-S2, no murmurs appreciated.  Capillary refill is brisk.  JVP not visible.  No LE edema.  Radial pulses 2+ and symmetric. Respiratory: Normal respiratory rate and rhythm.  CTAB without rales or wheezes. Abdomen: Abdomen soft.  Mild right sided TTP with right flank pain near nephrostomy tube. No ascites, distension, hepatosplenomegaly.   MSK: No deformities or effusions. Neuro: Awake and alert but tired and sluggish.  EOMI, moves all extremities with severe weakness. Speech fluent.    Psych: Sensorium intact and responding to questions, attention normal. Affect blunted.  Judgment and insight appear normal.    Data Reviewed: I have personally reviewed  following labs and imaging studies:  CBC: Recent Labs  Lab 12/27/19 1301 12/27/19 1301 12/27/19 2009 12/28/19 0421 12/29/19 0207 12/30/19 0223 12/31/19 0101  WBC 21.7*  --   --  25.3* 19.9* 13.8* 10.8*  NEUTROABS 20.7*  --   --  22.7* 16.9* 10.8*  --   HGB 11.8*   < > 10.9* 8.6* 9.3* 9.1* 9.8*  HCT 38.0   < > 32.0* 28.9* 30.1* 28.7* 30.3*  MCV 93.1  --   --  99.3 95.0 90.8 90.4  PLT 304  --   --  188 204 213 220   < > = values in this interval not displayed.   Basic Metabolic Panel: Recent Labs  Lab 12/27/19 1301 12/27/19 1301 12/27/19 2009 12/28/19 0421 12/29/19 0207 12/30/19 0223 12/31/19 0101  NA 140   < > 139 137 140 139 141  K 3.8   < > 4.1 4.2 3.7 3.5 3.1*  CL 101  --   --  106 105 107 109  CO2 25  --   --  16* 22 21* 22  GLUCOSE 142*  --   --  72 74 104* 115*  BUN 24*  --   --  15 23 23 17   CREATININE 1.25*  --   --  0.73 1.12* 1.05* 0.97  CALCIUM 9.6  --   --  7.3* 8.5* 8.4* 8.2*  MG  --   --   --  0.9* 2.3 1.8  --    < > = values in this interval not displayed.   GFR: Estimated Creatinine Clearance: 34.6 mL/min (by C-G formula based on SCr of 0.97 mg/dL). Liver Function Tests: Recent Labs  Lab 12/27/19 1301 12/28/19 0421 12/30/19 0223 12/31/19 0101  AST 111* 28 19 17   ALT 61* 23 19 17   ALKPHOS 112 48 68 75  BILITOT 1.2 0.5 1.1 0.5  PROT 6.2* 3.3* 5.1* 4.9*  ALBUMIN 2.9* 1.4* 2.1* 2.2*   Recent Labs  Lab 12/27/19 1301  LIPASE 20   No results for input(s): AMMONIA in the last 168 hours. Coagulation Profile: No results for input(s): INR, PROTIME in the last 168 hours. Cardiac Enzymes: No results for input(s): CKTOTAL, CKMB, CKMBINDEX, TROPONINI in the last 168 hours. BNP (last 3 results) No results for input(s): PROBNP in the last 8760 hours. HbA1C: No results for input(s): HGBA1C in the last 72 hours. CBG: Recent Labs  Lab 12/27/19 1255  GLUCAP 130*   Lipid Profile: No results for input(s): CHOL, HDL, LDLCALC, TRIG, CHOLHDL,  LDLDIRECT in the last 72 hours. Thyroid Function Tests: No results for input(s): TSH, T4TOTAL, FREET4, T3FREE, THYROIDAB in the last 72 hours. Anemia Panel: No results for input(s): VITAMINB12, FOLATE, FERRITIN, TIBC, IRON, RETICCTPCT in the last 72 hours. Urine analysis:    Component Value Date/Time   COLORURINE AMBER (A) 12/27/2019 1720   APPEARANCEUR CLOUDY (A) 12/27/2019 1720   LABSPEC 1.015 12/27/2019 1720   PHURINE 8.0 12/27/2019 1720   GLUCOSEU NEGATIVE 12/27/2019 1720   HGBUR MODERATE (A) 12/27/2019 1720   BILIRUBINUR SMALL (A) 12/27/2019 1720  KETONESUR NEGATIVE 12/27/2019 1720   PROTEINUR 100 (A) 12/27/2019 1720   UROBILINOGEN 0.2 01/15/2014 2032   NITRITE NEGATIVE 12/27/2019 1720   LEUKOCYTESUR MODERATE (A) 12/27/2019 1720   Sepsis Labs: @LABRCNTIP (procalcitonin:4,lacticacidven:4)  ) Recent Results (from the past 240 hour(s))  Blood Culture (routine x 2)     Status: Abnormal   Collection Time: 12/27/19  1:01 PM   Specimen: BLOOD  Result Value Ref Range Status   Specimen Description BLOOD SITE NOT SPECIFIED  Final   Special Requests   Final    BOTTLES DRAWN AEROBIC AND ANAEROBIC Blood Culture adequate volume   Culture  Setup Time   Final    GRAM NEGATIVE RODS Organism ID to follow CRITICAL RESULT CALLED TO, READ BACK BY AND VERIFIED WITH: A. 02/26/20 PharmD 15:00 12/28/19 ([email or web address]) Performed at [facility], [address], [place], [place]    Culture PROTEUS MIRABILIS (A)  Final   Report Status 12/30/2019 FINAL  Final   Organism ID, Bacteria PROTEUS MIRABILIS  Final      Susceptibility   Proteus mirabilis - MIC*    AMPICILLIN <=2 SENSITIVE Sensitive     CEFAZOLIN <=4 SENSITIVE Sensitive     CEFEPIME <=0.12 SENSITIVE Sensitive     CEFTAZIDIME <=1 SENSITIVE Sensitive     CEFTRIAXONE <=0.25 SENSITIVE Sensitive     CIPROFLOXACIN >=4 RESISTANT Resistant     GENTAMICIN <=1 SENSITIVE Sensitive     IMIPENEM 2 SENSITIVE Sensitive     TRIMETH/SULFA  >=320 RESISTANT Resistant     AMPICILLIN/SULBACTAM <=2 SENSITIVE Sensitive     PIP/TAZO <=4 SENSITIVE Sensitive     * PROTEUS MIRABILIS  Blood Culture ID Panel (Reflexed)     Status: Abnormal   Collection Time: 12/27/19  1:01 PM  Result Value Ref Range Status   Enterococcus species NOT DETECTED NOT DETECTED Final   Listeria monocytogenes NOT DETECTED NOT DETECTED Final   Staphylococcus species NOT DETECTED NOT DETECTED Final   Staphylococcus aureus (BCID) NOT DETECTED NOT DETECTED Final   Streptococcus species NOT DETECTED NOT DETECTED Final   Streptococcus agalactiae NOT DETECTED NOT DETECTED Final   Streptococcus pneumoniae NOT DETECTED NOT DETECTED Final   Streptococcus pyogenes NOT DETECTED NOT DETECTED Final   Acinetobacter baumannii NOT DETECTED NOT DETECTED Final   Enterobacteriaceae species DETECTED (A) NOT DETECTED Final    Comment: 02/26/20 PharmD 1500 12/28/19 ([email or web address]) Enterobacteriaceae represent a large family of gram-negative bacteria, not a single organism.    Enterobacter cloacae complex NOT DETECTED NOT DETECTED Final   Escherichia coli NOT DETECTED NOT DETECTED Final   Klebsiella oxytoca NOT DETECTED NOT DETECTED Final   Klebsiella pneumoniae NOT DETECTED NOT DETECTED Final   Proteus species DETECTED (A) NOT DETECTED Final    Comment: CRITICAL RESULT CALLED TO, READ BACK BY AND VERIFIED WITH: A. 02/27/20 PharmD 15:00 12/28/19 (wilsonm)    Serratia marcescens NOT DETECTED NOT DETECTED Final   Haemophilus influenzae NOT DETECTED NOT DETECTED Final   Neisseria meningitidis NOT DETECTED NOT DETECTED Final   Pseudomonas aeruginosa NOT DETECTED NOT DETECTED Final   Candida albicans NOT DETECTED NOT DETECTED Final   Candida glabrata NOT DETECTED NOT DETECTED Final   Candida krusei NOT DETECTED NOT DETECTED Final   Candida parapsilosis NOT DETECTED NOT DETECTED Final   Candida tropicalis NOT DETECTED NOT DETECTED Final    Comment: Performed at [facility], [address] [place], [place]  SARS Coronavirus 2 by RT PCR (hospital order, performed  in [facility]) Nasopharyngeal Nasopharyngeal Swab     Status: None   Collection Time: 12/27/19  1:25 PM   Specimen: Nasopharyngeal Swab  Result Value Ref Range Status   SARS Coronavirus 2 NEGATIVE NEGATIVE Final    Comment: (NOTE) SARS-CoV-2 target nucleic acids are NOT DETECTED.  The SARS-CoV-2 RNA is generally detectable in upper and lower respiratory specimens during the acute phase of infection. The lowest concentration of SARS-CoV-2 viral copies this assay can detect is 250 copies / mL. A negative result does not preclude SARS-CoV-2 infection and should not be used as the sole basis for treatment or other patient management decisions.  A negative result may occur with improper specimen collection / handling, submission of specimen other than nasopharyngeal swab, presence of viral mutation(s) within the areas targeted by this assay, and inadequate number of viral copies (<250 copies / mL). A negative result must be combined with clinical observations, patient history, and epidemiological information.  Fact Sheet for Patients:   BoilerBrush.com.cyhttps://www.fda.gov/media/136312/download  Fact Sheet for Healthcare Providers: https://pope.com/https://www.fda.gov/media/136313/download  This test is not yet approved or  cleared by the [place] FDA and has been authorized for detection and/or diagnosis of SARS-CoV-2 by FDA under an Emergency Use Authorization (EUA).  This EUA will remain in effect (meaning this test can be used) for the duration of the COVID-19 declaration under Section 564(b)(1) of the Act, 21 U.S.C. section 360bbb-3(b)(1), unless the authorization is terminated or revoked sooner.  Performed at [facility], [address] [place], [place] [address]   Urine culture     Status: Abnormal   Collection Time: 12/27/19  5:31 PM   Specimen: Urine, Clean Catch  Result Value Ref Range Status    Specimen Description URINE, CLEAN CATCH  Final   Special Requests   Final    NONE Performed at [facility], [address] [place], [place] [address]    Culture MULTIPLE SPECIES PRESENT, SUGGEST RECOLLECTION (A)  Final   Report Status 12/28/2019 FINAL  Final         Radiology Studies: No results found.      Scheduled Meds: . amiodarone  200 mg Oral BID   Followed by  . [START ON 01/13/2020] amiodarone  200 mg Oral Daily  . enoxaparin (LOVENOX) injection  40 mg Subcutaneous Q24H  . feeding supplement (ENSURE ENLIVE)  237 mL Oral BID BM  . multivitamin with minerals  1 tablet Oral Daily  . nebivolol  5 mg Oral Daily  . pantoprazole  40 mg Oral QHS  . potassium chloride  40 mEq Oral BID  . sodium chloride flush  5 mL Intracatheter Q8H   Continuous Infusions: . ampicillin (OMNIPEN) IV 1 g (12/31/19 1327)     LOS: 4 days    Time spent: 25 minutes    Alberteen Samhristopher P Cambrea Kirt, MD [facility] 12/31/2019, 5:21 PM     Please page though AMION or Epic secure chat:  For [employer] password, Higher education careers advisercontact charge nurse

## 2019-12-31 NOTE — TOC Initial Note (Signed)
Transition of Care [facility]) - Initial/Assessment Note    Patient Details  Name: [name] MRN: [id number] Date of Birth: [birth year]  Transition of Care [facility]) CM/SW Contact:    Beckie Busing, RN Phone Number: [phone]  12/31/2019, 2:38 PM  Clinical Narrative:                 TOC following patient admitted from [facility]. CM has confirmed with janie at [facility] that patient is holding her bed and will be able to return to facility once she is clinically ready for discharge. FL2 has been completed. TOC will continue to follow.    Barriers to Discharge: Continued Medical Work up   Patient Goals and CMS Choice Patient states their goals for this hospitalization and ongoing recovery are:: Wants to go home CMS Medicare.gov Compare Post Acute Care list provided to:: Patient Choice offered to / list presented to : Patient  Expected Discharge Plan and Services   In-house Referral: NA Discharge Planning Services: CM Consult Post Acute Care Choice: Nursing Home Living arrangements for the past 2 months: Skilled Nursing Facility                 DME Arranged: N/A DME Agency: NA       HH Arranged: NA HH Agency: NA        Prior Living Arrangements/Services Living arrangements for the past 2 months: Skilled Nursing Facility Lives with:: Facility Resident Patient language and need for interpreter reviewed:: Yes Do you feel safe going back to the place where you live?: Yes      Need for Family Participation in Patient Care: Yes (Comment) Care giver support system in place?: Yes (comment) Current home services:  (n/a) Criminal Activity/Legal Involvement Pertinent to Current Situation/Hospitalization: No - Comment as needed  Activities of Daily Living   ADL Screening (condition at time of admission) Patient's cognitive ability adequate to safely complete daily activities?: Yes Is the patient deaf or have difficulty hearing?: No Does the patient have difficulty seeing, even  when wearing glasses/contacts?: No Does the patient have difficulty concentrating, remembering, or making decisions?: Yes Patient able to express need for assistance with ADLs?: Yes Does the patient have difficulty dressing or bathing?: Yes Independently performs ADLs?: No Does the patient have difficulty walking or climbing stairs?: Yes Weakness of Legs: Both Weakness of Arms/Hands: None  Permission Sought/Granted Permission sought to share information with : Oceanographer granted to share information with : Yes, Verbal Permission Granted     Permission granted to share info w AGENCY: [facility]        Emotional Assessment Appearance:: Appears older than stated age Attitude/Demeanor/Rapport: Gracious Affect (typically observed): Calm, Quiet Orientation: : Oriented to [name], Oriented to Place, Oriented to  Time, Fluctuating Orientation (Suspected and/or reported Sundowners) (Reported intermittent confusion) Alcohol / Substance Use: Not Applicable Psych Involvement: No (comment)  Admission diagnosis:  Respiratory failure (HCC) [J96.90] Stroke (HCC) [I63.9] Septic shock (HCC) [A41.9, R65.21] Severe sepsis with acute organ dysfunction due to Gram negative bacteria (HCC) [A41.50, R65.20] Sepsis with acute organ dysfunction, due to unspecified organism, unspecified type, unspecified whether septic shock present (HCC) [A41.9, R65.20] Patient Active Problem List   Diagnosis Date Noted   Severe sepsis with acute organ dysfunction due to Gram negative bacteria (HCC) 12/27/2019   Acute metabolic encephalopathy 12/27/2019   Lactic acidosis 12/27/2019   Elevated troponin level not due myocardial infarction 12/27/2019   Mixed hyperlipidemia 12/27/2019   Obstruction of right ureteropelvic junction (UPJ)  due to stone 12/27/2019   Hip fracture (HCC) 11/18/2018   Closed displaced intertrochanteric fracture of right femur (HCC)    Fall at home, initial  encounter 09/11/2018   Generalized weakness 09/11/2018   UTI (urinary tract infection) 09/10/2018   Tachycardia    PAF (paroxysmal atrial fibrillation) (HCC)    Malnutrition of moderate degree 09/06/2017   Multiple rib fractures 09/04/2017   Dyspnea 01/21/2014   Chronic diastolic CHF (congestive heart failure) (HCC) 12/15/2013   Cerebral vascular disease 10/13/2013   Mixed incontinence urge and stress 04/22/2013   Nocturia 04/22/2013   Angina decubitus (HCC) 03/12/2013   GERD without esophagitis 08/16/2011   Other general symptoms  08/16/2011   Fatigue 09/04/2010   HEPATITIS, ACUTE 07/12/2009   AMI 07/12/2009   ESOPHAGEAL REFLUX 07/12/2009   DIVERTICULOSIS, COLON 07/12/2009   CHEST PAIN 07/12/2009   SLEEP APNEA 10/23/2007   Essential hypertension 09/18/2007   CAD (coronary artery disease) 09/18/2007   CHEST PAIN, ATYPICAL 09/18/2007   PCP:  Donita Brooks, MD Pharmacy:   [phone] [place], [place] - [address] [place] [address] Phone: [phone] Fax: [phone]     Social Determinants of Health (SDOH) Interventions    Readmission Risk Interventions No flowsheet data found.

## 2019-12-31 NOTE — [place] FL2 (Signed)
[place] MEDICAID FL2 LEVEL OF CARE SCREENING TOOL     IDENTIFICATION  Patient Name: [name] Birthdate: 1929/01/09 Sex: female Admission Date (Current Location): 12/27/2019  [facility] and [place] Number:  Producer, television/film/video and Address:  [facility] [place], [address] [place], [place] [address]      Provider Number: [id number]  Attending Physician Name and Address:  Alberteen Sam, *  Relative Name and Phone Number:  [name] (niece) [phone] home  [phone] cell    Current Level of Care: Hospital Recommended Level of Care: Skilled Nursing Facility Prior Approval Number:    Date Approved/Denied:   PASRR Number: [id number]  Discharge Plan: SNF    Current Diagnoses: Patient Active Problem List   Diagnosis Date Noted  . Severe sepsis with acute organ dysfunction due to Gram negative bacteria (HCC) 12/27/2019  . Acute metabolic encephalopathy 12/27/2019  . Lactic acidosis 12/27/2019  . Elevated troponin level not due myocardial infarction 12/27/2019  . Mixed hyperlipidemia 12/27/2019  . Obstruction of right ureteropelvic junction (UPJ) due to stone 12/27/2019  . Hip fracture (HCC) 11/18/2018  . Closed displaced intertrochanteric fracture of right femur (HCC)   . Fall at home, initial encounter 09/11/2018  . Generalized weakness 09/11/2018  . UTI (urinary tract infection) 09/10/2018  . Tachycardia   . PAF (paroxysmal atrial fibrillation) (HCC)   . Malnutrition of moderate degree 09/06/2017  . Multiple rib fractures 09/04/2017  . Dyspnea 01/21/2014  . Chronic diastolic CHF (congestive heart failure) (HCC) 12/15/2013  . Cerebral vascular disease 10/13/2013  . Mixed incontinence urge and stress 04/22/2013  . Nocturia 04/22/2013  . Angina decubitus (HCC) 03/12/2013  . GERD without esophagitis 08/16/2011  . Other general symptoms  08/16/2011  . Fatigue 09/04/2010  . HEPATITIS, ACUTE 07/12/2009  . AMI 07/12/2009  .  ESOPHAGEAL REFLUX 07/12/2009  . DIVERTICULOSIS, COLON 07/12/2009  . CHEST PAIN 07/12/2009  . SLEEP APNEA 10/23/2007  . Essential hypertension 09/18/2007  . CAD (coronary artery disease) 09/18/2007  . CHEST PAIN, ATYPICAL 09/18/2007    Orientation RESPIRATION BLADDER Height & Weight     Self, Time, Place (periods of confusion)  Normal Incontinent, External catheter Weight: 56.9 kg Height:  5\' 4"  (162.6 cm)  BEHAVIORAL SYMPTOMS/MOOD NEUROLOGICAL BOWEL NUTRITION STATUS     (no history) Incontinent Diet (Dysphagia 2 with thin liquids)  AMBULATORY STATUS COMMUNICATION OF NEEDS Skin   Total Care Verbally Other (Comment) (pale dry intact)                       Personal Care Assistance Level of Assistance  Bathing, Feeding, Dressing, Total care Bathing Assistance: Maximum assistance Feeding assistance: Limited assistance (independent after set up) Dressing Assistance: Maximum assistance Total Care Assistance: Maximum assistance   Functional Limitations Info  Sight, Hearing, Speech Sight Info: Adequate Hearing Info: Adequate Speech Info: Adequate    SPECIAL CARE FACTORS FREQUENCY  PT (By licensed PT), OT (By licensed OT)     PT Frequency: 5X OT Frequency: 5X            Contractures Contractures Info: Not present    Additional Factors Info  Code Status, Allergies, Psychotropic, Insulin Sliding Scale, Isolation Precautions, Suctioning Needs Code Status Info: DNR Allergies Info: Metoprolol, Phenergan, Prochlorperazine Edisylate, Zetia, Compazine, Sulfur, Niacin, Sulfonamide Derivatives Psychotropic Info: None noted Insulin Sliding Scale Info: n/a Isolation Precautions Info: none Suctioning Needs: n/a   Current Medications (12/31/2019):  This is the current hospital active  medication list Current Facility-Administered Medications  Medication Dose Route Frequency Provider Last Rate Last Admin  . acetaminophen (TYLENOL) tablet 650 mg  650 mg Oral Q6H PRN Shalhoub, Deno Lunger, MD       Or  . acetaminophen (TYLENOL) suppository 650 mg  650 mg Rectal Q6H PRN Shalhoub, Deno Lunger, MD      . albuterol (PROVENTIL) (2.5 MG/3ML) 0.083% nebulizer solution 2.5 mg  2.5 mg Nebulization Q4H PRN Shalhoub, Deno Lunger, MD      . amiodarone (PACERONE) tablet 200 mg  200 mg Oral BID Little Ishikawa, MD       Followed by  . [START ON 01/13/2020] amiodarone (PACERONE) tablet 200 mg  200 mg Oral Daily Little Ishikawa, MD      . ampicillin (OMNIPEN) 1 g in sodium chloride 0.9 % 100 mL IVPB  1 g Intravenous Q8H Danford, Earl Lites, MD 300 mL/hr at 12/31/19 1327 1 g at 12/31/19 1327  . enoxaparin (LOVENOX) injection 40 mg  40 mg Subcutaneous Q24H Danford, Earl Lites, MD   40 mg at 12/31/19 1329  . feeding supplement (ENSURE ENLIVE) (ENSURE ENLIVE) liquid 237 mL  237 mL Oral BID BM Samtani, Jai-Gurmukh, MD   237 mL at 12/31/19 1331  . hydrALAZINE (APRESOLINE) injection 10 mg  10 mg Intravenous Q6H PRN Shalhoub, Deno Lunger, MD      . multivitamin with minerals tablet 1 tablet  1 tablet Oral Daily Rhetta Mura, MD   1 tablet at 12/31/19 4709  . nebivolol (BYSTOLIC) tablet 5 mg  5 mg Oral Daily Little Ishikawa, MD   5 mg at 12/31/19 6283  . ondansetron (ZOFRAN) tablet 4 mg  4 mg Oral Q6H PRN Shalhoub, Deno Lunger, MD       Or  . ondansetron [facility]) injection 4 mg  4 mg Intravenous Q6H PRN Shalhoub, Deno Lunger, MD      . pantoprazole (PROTONIX) EC tablet 40 mg  40 mg Oral QHS Alberteen Sam, MD   40 mg at 12/30/19 2131  . potassium chloride SA (KLOR-CON) CR tablet 40 mEq  40 mEq Oral BID Alberteen Sam, MD   40 mEq at 12/31/19 0927  . sodium chloride flush (NS) 0.9 % injection 5 mL  5 mL Intracatheter Q8H Simonne Come, MD   5 mL at 12/31/19 6629     Discharge Medications: Please see discharge summary for a list of discharge medications.  Relevant Imaging Results:  Relevant Lab Results:   Additional Information [id number]  Beckie Busing,  RN

## 2019-12-31 NOTE — Progress Notes (Signed)
Progress Note  Patient Name: [name] Date of Encounter: 12/31/2019  [facility] Cardiologist: Kristeen Miss, MD   Subjective   Reports improvement in dyspnea.  Denies any chest pain.  Oriented x3 this morning  Inpatient Medications    Scheduled Meds: . amiodarone  200 mg Oral BID  . feeding supplement (ENSURE ENLIVE)  237 mL Oral BID BM  . heparin injection (subcutaneous)  5,000 Units Subcutaneous Q8H  . multivitamin with minerals  1 tablet Oral Daily  . nebivolol  5 mg Oral Daily  . pantoprazole  40 mg Oral QHS  . potassium chloride  40 mEq Oral BID  . sodium chloride flush  5 mL Intracatheter Q8H   Continuous Infusions: . sodium chloride 75 mL/hr at 12/30/19 2129  . ampicillin (OMNIPEN) IV 1 g (12/31/19 0514)   PRN Meds: acetaminophen **OR** acetaminophen, albuterol, hydrALAZINE, ondansetron **OR** ondansetron (ZOFRAN) IV   Vital Signs    Vitals:   12/30/19 2030 12/30/19 2116 12/31/19 0500 12/31/19 0724  BP:    114/61  Pulse:    69  Resp: (!) 23   18  Temp:    98.7 F (37.1 C)  TempSrc:      SpO2:    93%  Weight:  55.8 kg 56.9 kg   Height:        Intake/Output Summary (Last 24 hours) at 12/31/2019 0920 Last data filed at 12/31/2019 0636 Gross per 24 hour  Intake 1483.75 ml  Output 770 ml  Net 713.75 ml   Last 3 Weights 12/31/2019 12/30/2019 12/27/2019  Weight (lbs) 125 lb 7.1 oz 123 lb 0.3 oz 107 lb 3.2 oz  Weight (kg) 56.9 kg 55.8 kg 48.626 kg      Telemetry    Sinus rhythm in 60s- Personally Reviewed  ECG    No new ECG- Personally Reviewed  Physical Exam   GEN: No acute distress.   Neck: +JVD Cardiac: RRR, no murmurs, rubs, or gallops.  Respiratory: Clear to auscultation bilaterally. GI: Soft, nontender, non-distended  MS: No edema; No deformity. Neuro:  Nonfocal.  A&O x3 Psych: Normal affect   Labs    High Sensitivity Troponin:   Recent Labs  Lab 12/27/19 1301 12/27/19 1458 12/27/19 2330  TROPONINIHS 188* 218* 126*       Chemistry Recent Labs  Lab 12/28/19 0421 12/28/19 0421 12/29/19 0207 12/30/19 0223 12/31/19 0101  NA 137   < > 140 139 141  K 4.2   < > 3.7 3.5 3.1*  CL 106   < > 105 107 109  CO2 16*   < > 22 21* 22  GLUCOSE 72   < > 74 104* 115*  BUN 15   < > 23 23 17   CREATININE 0.73   < > 1.12* 1.05* 0.97  CALCIUM 7.3*   < > 8.5* 8.4* 8.2*  PROT 3.3*  --   --  5.1* 4.9*  ALBUMIN 1.4*  --   --  2.1* 2.2*  AST 28  --   --  19 17  ALT 23  --   --  19 17  ALKPHOS 48  --   --  68 75  BILITOT 0.5  --   --  1.1 0.5  GFRNONAA >60   < > 44* 47* 52*  GFRAA >60   < > 51* 55* >60  ANIONGAP 15   < > 13 11 10    < > = values in this interval not displayed.  Hematology Recent Labs  Lab 12/29/19 0207 12/30/19 0223 12/31/19 0101  WBC 19.9* 13.8* 10.8*  RBC 3.17* 3.16* 3.35*  HGB 9.3* 9.1* 9.8*  HCT 30.1* 28.7* 30.3*  MCV 95.0 90.8 90.4  MCH 29.3 28.8 29.3  MCHC 30.9 31.7 32.3  RDW 13.9 13.7 13.7  PLT 204 213 220    BNPNo results for input(s): BNP, PROBNP in the last 168 hours.   DDimer  Recent Labs  Lab 12/27/19 1301  DDIMER 6.42*     Radiology    No results found.  Cardiac Studies   TTE 12/28/19:  1. No apical images obtained to evaluate diastolic function. This study  is limited for diagnostic assessment.  2. Left ventricular ejection fraction, by estimation, is 65 to 70%. The  left ventricle has normal function. Left ventricular endocardial border  not optimally defined to evaluate regional wall motion. There is mild  asymmetric left ventricular hypertrophy  of the septal segment. Left ventricular diastolic function could not be  evaluated.  3. Right ventricular systolic function is normal. The right ventricular  size is normal. There is mildly elevated pulmonary artery systolic  pressure. The estimated right ventricular systolic pressure is 43.1 mmHg.  4. The mitral valve is degenerative. Trivial mitral valve regurgitation.  5. The tricuspid valve is  degenerative. Tricuspid valve regurgitation is  mild to moderate.  6. The aortic valve is abnormal. AV is incompletely assessed.   Patient Profile     86 y.o. female with a PMH of CAD s/p CABG 1997 with redo in 2010, paroxysmal atrial fibrillation, ICM with improvement in EF, HTN, HLD, Carotid artery disease s/p right CEA 1995, Asthma, CVA 2009, and frequent falls, who is being seen for the evaluation of atrial fibrillation   Assessment & Plan    Paroxysmal atrial fibrillation: Went into AF with RVR on 8/30, was started on IV amiodarone and converted to sinus rhythm. -Not on anticoagulation due to frequent falls -Continue home nebivolol -Will plan short course of amiodarone while recovering from acute illness, will not plan long-term use.  Continue amio 200 mg twice daily x2 weeks, then 200 mg daily x2 weeks, then discontinue  [facility] will sign off.   Medication Recommendations: Continue nebivolol.  PO amiodarone 200 mg twice daily x2 weeks, then 200 mg daily x2 weeks, then stop Other recommendations (labs, testing, etc):  None Follow up as an outpatient:  Will schedule follow-up   For questions or updates, please contact [facility] Please consult [email or web address] for contact info under        Signed, Little Ishikawa, MD  12/31/2019, 9:20 AM

## 2020-01-01 DIAGNOSIS — Z7189 Other specified counseling: Secondary | ICD-10-CM

## 2020-01-01 DIAGNOSIS — Z515 Encounter for palliative care: Secondary | ICD-10-CM

## 2020-01-01 DIAGNOSIS — Z66 Do not resuscitate: Secondary | ICD-10-CM

## 2020-01-01 LAB — COMPREHENSIVE METABOLIC PANEL
ALT: 16 U/L (ref 0–44)
AST: 17 U/L (ref 15–41)
Albumin: 2.1 g/dL — ABNORMAL LOW (ref 3.5–5.0)
Alkaline Phosphatase: 67 U/L (ref 38–126)
Anion gap: 11 (ref 5–15)
BUN: 13 mg/dL (ref 8–23)
CO2: 19 mmol/L — ABNORMAL LOW (ref 22–32)
Calcium: 8.4 mg/dL — ABNORMAL LOW (ref 8.9–10.3)
Chloride: 112 mmol/L — ABNORMAL HIGH (ref 98–111)
Creatinine, Ser: 0.85 mg/dL (ref 0.44–1.00)
GFR calc Af Amer: >60 mL/min (ref >60)
GFR calc non Af Amer: >60 mL/min (ref >60)
Glucose, Bld: 111 mg/dL — ABNORMAL HIGH (ref 70–99)
Potassium: 4.2 mmol/L (ref 3.5–5.1)
Sodium: 142 mmol/L (ref 135–145)
Total Bilirubin: 0.7 mg/dL (ref 0.3–1.2)
Total Protein: 4.7 g/dL — ABNORMAL LOW (ref 6.5–8.1)

## 2020-01-01 LAB — CBC
HCT: 29.8 % — ABNORMAL LOW (ref 36.0–46.0)
Hemoglobin: 9.3 g/dL — ABNORMAL LOW (ref 12.0–15.0)
MCH: 28.5 pg (ref 26.0–34.0)
MCHC: 31.2 g/dL (ref 30.0–36.0)
MCV: 91.4 fL (ref 80.0–100.0)
Platelets: 239 10*3/uL (ref 150–400)
RBC: 3.26 MIL/uL — ABNORMAL LOW (ref 3.87–5.11)
RDW: 13.9 % (ref 11.5–15.5)
WBC: 9 10*3/uL (ref 4.0–10.5)
nRBC: 0 % (ref 0.0–0.2)

## 2020-01-01 MED ORDER — ENSURE ENLIVE PO LIQD
237.0000 mL | Freq: Three times a day (TID) | ORAL | Status: DC
  Administered 2020-01-01 – 2020-01-02 (×4): 237 mL via ORAL

## 2020-01-01 NOTE — Progress Notes (Signed)
   01/01/20 1216  Clinical Encounter Type  Visited With Patient  Visit Type Spiritual support;Other (Comment) (HCPOA)  Referral From Palliative care team  Consult/Referral To Chaplain  This chaplain responded to PMT consult for spiritual care.  The chaplain understands from the PMT the Pt. wants to complete HCPOA.  At the time of the visit, the chaplain experienced incongruence in the Pt. story telling. The chaplain checked in with the Pt. RN-[place]. The RN agreed with the chaplain, the Pt. has a clearer thought process early in the morning.  The chaplain is unable to complete HCPOA with the Pt. today. The Pt. confirmed with the chaplain the Pt. niece is the person to contact if the Pt. is not able to make medical decisions for herself.  F/U spiritual care is available as needed.

## 2020-01-01 NOTE — Progress Notes (Addendum)
Nutrition Follow-up  DOCUMENTATION CODES:   Non-severe (moderate) malnutrition in context of chronic illness  INTERVENTION:  Provide Ensure Enlive po TID, each supplement provides 350 kcal and 20 grams of protein.  Encourage adequate PO intake.   NUTRITION DIAGNOSIS:   Moderate Malnutrition related to chronic illness (CHF) as evidenced by moderate fat depletion, moderate muscle depletion; ongoing  GOAL:   Patient will meet greater than or equal to 90% of their needs; progressing  MONITOR:   PO intake, Supplement acceptance, Weight trends, Labs, I & O's  REASON FOR ASSESSMENT:   Malnutrition Screening Tool    ASSESSMENT:   Patient with PMH significant for CAD s/p CABG, CHF, CVA, PVD s/p R CEA, asthma, GERD, HLD, and HTN. Presents this admission with UTI and UPJ stone. 8/1- s/p IR nephrostomy   Per Palliative care, plans for hospice upon discharge. Pt and family desire no tube feeding. Meal completion has been 5%. Pt reports feeling hungry, however pt with poor po intake at meals. Pt reports trying to consume at least 3 meals a day prior to admission, however pt unable provide history on usual foods eaten and usual meal completion. Pt currently has Ensure ordered and has been consuming them. RD to increase Ensure to TID to aid in caloric and protein needs. Pt encouraged to eat her food at meals and to drink her supplements. Pt unable to state her usual body weight.   NUTRITION - FOCUSED PHYSICAL EXAM:    Most Recent Value  Orbital Region Moderate depletion  Upper Arm Region Moderate depletion  Thoracic and Lumbar Region Moderate depletion  Buccal Region Moderate depletion  Temple Region Moderate depletion  Clavicle Bone Region Moderate depletion  Clavicle and Acromion Bone Region Moderate depletion  Scapular Bone Region Moderate depletion  Dorsal Hand Moderate depletion  Patellar Region Moderate depletion  Anterior Thigh Region Moderate depletion  Posterior Calf Region  Moderate depletion  Edema (RD Assessment) None  Hair Reviewed  Eyes Reviewed  Mouth Reviewed  Skin Reviewed  Nails Reviewed      Labs and medications reviewed.   Diet Order:   Diet Order            DIET DYS 2 Room service appropriate? No; Fluid consistency: Thin  Diet effective now                 EDUCATION NEEDS:   Not appropriate for education at this time  Skin:  Skin Assessment: Reviewed RN Assessment  Last BM:  Unknown  Height:   Ht Readings from Last 1 Encounters:  12/27/19 5\' 4"  (1.626 m)    Weight:   Wt Readings from Last 1 Encounters:  12/31/19 56.9 kg    BMI:  Body mass index is 21.53 kg/m.  Estimated Nutritional Needs:   Kcal:  1500-1700 kcal  Protein:  75-90 grams  Fluid:  >/= 1.5 L/day  03/01/20, MS, RD, LDN RD pager number/after hours weekend pager number on Amion.

## 2020-01-01 NOTE — Progress Notes (Signed)
PROGRESS NOTE    [name]  OVF:[id number] DOB: 01/24/1929 DOA: 12/27/2019 PCP: Donita Brooks, MD      Brief Narrative:  [name] is a 86 y.o. F with CAD status post CABG 78, D CHF, HTN, PVD status post CEA, CVA, PAF, and asthma who presented with decreased mentation and lethargy and confusion.  Found to have Proteus bacteremia.           Assessment & Plan:  Sepsis due to UTI/Proteus bacteremia, due to nephrolithiasis with hydronephrosis requiring nephrostomy tube placement Status post nephrostomy tube placement 8/1 by interventional radiology Presented with tachycardia, leukocytosis, fever, suspected UTI, and evidence of endorgan failure new (qSOFA 1 for encephalopathy).  Cultures now showing beta-lactam sensitive Proteus -Continue ampicillin, day 6   Paroxysmal atrial fibrillation Heart rate now normalized and stable.  Not on anticoagulation due to frequent falls.  Cardiology consulted. -Continue amiodarone and nadolol  Acute metabolic encephalopathy Due to sepsis, slowly improving, oriented to person, place, and time, cooperative and interactive, but still somewhat forgetful  Acute renal failure due to sepsis Resolved  Hypomagnesemia Magnesium resolved  Hypokalemia -Supplement K  Elevated troponin Doubt ACS, likely demand ischemia  Chronic diastolic CHF Appears euvolemic  Anemia Hgb stable, no clinical bleeding  Severe protein calorie malnutrition  Cerebrovascular disease Peripheral vascular disease Hypertension Blood pressure soft -Hold aspirin, Imdur, Crestor  GERD -Continue PPI  Mood disorder -Resume Lexapro at discharge   Dysphagia DYS 2 diet  Protein calorie malnutrition As evidenced by BMI 21, active severe diffuse loss of muscle mass and fat, poor oral intake, and chronic terminal illness    Disposition: Status is: Inpatient  Remains inpatient appropriate because:IV treatments appropriate due to intensity of illness  or inability to take PO   Dispo:  Patient From: Skilled Nursing Facility  Planned Disposition: Skilled Nursing Facility  Expected discharge date: 01/02/20  Medically stable for discharge: No               MDM: The below labs and imaging reports were reviewed and summarized above.  Medication management as above.     DVT prophylaxis: enoxaparin (LOVENOX) injection 40 mg Start: 12/31/19 1330  Code Status: DNR Family Communication: Niece by phone  Consultants:   Cardiology Urology           Subjective: Patient is weak and tired, but her mentation is better, no fevers overnight, and some mild back pain in her right flank.  No confusion or vomiting.  Objective: Vitals:   01/01/20 0005 01/01/20 0343 01/01/20 0821 01/01/20 1526  BP: (!) 114/56 (!) 102/50 (!) 128/56 (!) 114/52  Pulse: 64 63 66 66  Resp: 18 18 16 16   Temp: 98.3 F (36.8 C) 98 F (36.7 C) 97.9 F (36.6 C) 98.4 F (36.9 C)  TempSrc: Oral     SpO2: 95% 92% 95% 93%  Weight:      Height:       No intake or output data in the 24 hours ending 01/01/20 1835 Filed Weights   12/27/19 1402 12/30/19 2116 12/31/19 0500  Weight: 48.6 kg 55.8 kg 56.9 kg    Examination: General appearance: Chronically ill-appearing elderly female.  Lying in bed, interactive HEENT:    Skin:  . Cardiac: Regularly irregular, normal rate, cap refill normal, no lower extremity edema Respiratory: Respirations shallow, normal respiratory rate, lung sounds diminished throughout . Abdomen: Pain in right flank near nephrostomy tube, mild abdominal discomfort, diffusely, no guarding MSK: Diffuse loss of muscle mass  and fat Neuro: Awake and alert but tired and sluggish.  EOMI, moves all extremities with severe weakness. Speech fluent.    Psych: Sensorium intact and responding to questions, attention normal. Affect blunted.  Judgment and insight appear normal.    Data Reviewed: I have personally reviewed following labs and  imaging studies:  CBC: Recent Labs  Lab 12/27/19 1301 12/27/19 2009 12/28/19 0421 12/29/19 0207 12/30/19 0223 12/31/19 0101 01/01/20 0323  WBC 21.7*  --  25.3* 19.9* 13.8* 10.8* 9.0  NEUTROABS 20.7*  --  22.7* 16.9* 10.8*  --   --   HGB 11.8*   < > 8.6* 9.3* 9.1* 9.8* 9.3*  HCT 38.0   < > 28.9* 30.1* 28.7* 30.3* 29.8*  MCV 93.1  --  99.3 95.0 90.8 90.4 91.4  PLT 304  --  188 204 213 220 239   < > = values in this interval not displayed.   Basic Metabolic Panel: Recent Labs  Lab 12/28/19 0421 12/29/19 0207 12/30/19 0223 12/31/19 0101 01/01/20 0323  NA 137 140 139 141 142  K 4.2 3.7 3.5 3.1* 4.2  CL 106 105 107 109 112*  CO2 16* 22 21* 22 19*  GLUCOSE 72 74 104* 115* 111*  BUN 15 23 23 17 13   CREATININE 0.73 1.12* 1.05* 0.97 0.85  CALCIUM 7.3* 8.5* 8.4* 8.2* 8.4*  MG 0.9* 2.3 1.8  --   --    GFR: Estimated Creatinine Clearance: 39.5 mL/min (by C-G formula based on SCr of 0.85 mg/dL). Liver Function Tests: Recent Labs  Lab 12/27/19 1301 12/28/19 0421 12/30/19 0223 12/31/19 0101 01/01/20 0323  AST 111* 28 19 17 17   ALT 61* 23 19 17 16   ALKPHOS 112 48 68 75 67  BILITOT 1.2 0.5 1.1 0.5 0.7  PROT 6.2* 3.3* 5.1* 4.9* 4.7*  ALBUMIN 2.9* 1.4* 2.1* 2.2* 2.1*   Recent Labs  Lab 12/27/19 1301  LIPASE 20   No results for input(s): AMMONIA in the last 168 hours. Coagulation Profile: No results for input(s): INR, PROTIME in the last 168 hours. Cardiac Enzymes: No results for input(s): CKTOTAL, CKMB, CKMBINDEX, TROPONINI in the last 168 hours. BNP (last 3 results) No results for input(s): PROBNP in the last 8760 hours. HbA1C: No results for input(s): HGBA1C in the last 72 hours. CBG: Recent Labs  Lab 12/27/19 1255  GLUCAP 130*   Lipid Profile: No results for input(s): CHOL, HDL, LDLCALC, TRIG, CHOLHDL, LDLDIRECT in the last 72 hours. Thyroid Function Tests: No results for input(s): TSH, T4TOTAL, FREET4, T3FREE, THYROIDAB in the last 72 hours. Anemia  Panel: No results for input(s): VITAMINB12, FOLATE, FERRITIN, TIBC, IRON, RETICCTPCT in the last 72 hours. Urine analysis:    Component Value Date/Time   COLORURINE AMBER (A) 12/27/2019 1720   APPEARANCEUR CLOUDY (A) 12/27/2019 1720   LABSPEC 1.015 12/27/2019 1720   PHURINE 8.0 12/27/2019 1720   GLUCOSEU NEGATIVE 12/27/2019 1720   HGBUR MODERATE (A) 12/27/2019 1720   BILIRUBINUR SMALL (A) 12/27/2019 1720   KETONESUR NEGATIVE 12/27/2019 1720   PROTEINUR 100 (A) 12/27/2019 1720   UROBILINOGEN 0.2 01/15/2014 2032   NITRITE NEGATIVE 12/27/2019 1720   LEUKOCYTESUR MODERATE (A) 12/27/2019 1720   Sepsis Labs: @LABRCNTIP (procalcitonin:4,lacticacidven:4)  ) Recent Results (from the past 240 hour(s))  Blood Culture (routine x 2)     Status: Abnormal   Collection Time: 12/27/19  1:01 PM   Specimen: BLOOD  Result Value Ref Range Status   Specimen Description BLOOD SITE NOT SPECIFIED  Final  Special Requests   Final    BOTTLES DRAWN AEROBIC AND ANAEROBIC Blood Culture adequate volume   Culture  Setup Time   Final    GRAM NEGATIVE RODS Organism ID to follow CRITICAL RESULT CALLED TO, READ BACK BY AND VERIFIED WITH: A. Artis Flock PharmD 15:00 12/28/19 ([email or web address]) Performed at [facility], [address], [place], [place] [address]    Culture PROTEUS MIRABILIS (A)  Final   Report Status 12/30/2019 FINAL  Final   Organism ID, Bacteria PROTEUS MIRABILIS  Final      Susceptibility   Proteus mirabilis - MIC*    AMPICILLIN <=2 SENSITIVE Sensitive     CEFAZOLIN <=4 SENSITIVE Sensitive     CEFEPIME <=0.12 SENSITIVE Sensitive     CEFTAZIDIME <=1 SENSITIVE Sensitive     CEFTRIAXONE <=0.25 SENSITIVE Sensitive     CIPROFLOXACIN >=4 RESISTANT Resistant     GENTAMICIN <=1 SENSITIVE Sensitive     IMIPENEM 2 SENSITIVE Sensitive     TRIMETH/SULFA >=320 RESISTANT Resistant     AMPICILLIN/SULBACTAM <=2 SENSITIVE Sensitive     PIP/TAZO <=4 SENSITIVE Sensitive     * PROTEUS MIRABILIS  Blood Culture  ID Panel (Reflexed)     Status: Abnormal   Collection Time: 12/27/19  1:01 PM  Result Value Ref Range Status   Enterococcus species NOT DETECTED NOT DETECTED Final   Listeria monocytogenes NOT DETECTED NOT DETECTED Final   Staphylococcus species NOT DETECTED NOT DETECTED Final   Staphylococcus aureus (BCID) NOT DETECTED NOT DETECTED Final   Streptococcus species NOT DETECTED NOT DETECTED Final   Streptococcus agalactiae NOT DETECTED NOT DETECTED Final   Streptococcus pneumoniae NOT DETECTED NOT DETECTED Final   Streptococcus pyogenes NOT DETECTED NOT DETECTED Final   Acinetobacter baumannii NOT DETECTED NOT DETECTED Final   Enterobacteriaceae species DETECTED (A) NOT DETECTED Final    Comment: Joretta Bachelor PharmD 1500 12/28/19 ([email or web address]) Enterobacteriaceae represent a large family of gram-negative bacteria, not a single organism.    Enterobacter cloacae complex NOT DETECTED NOT DETECTED Final   Escherichia coli NOT DETECTED NOT DETECTED Final   Klebsiella oxytoca NOT DETECTED NOT DETECTED Final   Klebsiella pneumoniae NOT DETECTED NOT DETECTED Final   Proteus species DETECTED (A) NOT DETECTED Final    Comment: CRITICAL RESULT CALLED TO, READ BACK BY AND VERIFIED WITH: A. Artis Flock PharmD 15:00 12/28/19 ([email or web address])    Serratia marcescens NOT DETECTED NOT DETECTED Final   Haemophilus influenzae NOT DETECTED NOT DETECTED Final   Neisseria meningitidis NOT DETECTED NOT DETECTED Final   Pseudomonas aeruginosa NOT DETECTED NOT DETECTED Final   Candida albicans NOT DETECTED NOT DETECTED Final   Candida glabrata NOT DETECTED NOT DETECTED Final   Candida krusei NOT DETECTED NOT DETECTED Final   Candida parapsilosis NOT DETECTED NOT DETECTED Final   Candida tropicalis NOT DETECTED NOT DETECTED Final    Comment: Performed at [facility], [address] [place], [place] [address]  SARS Coronavirus 2 by RT PCR (hospital order, performed in [facility] hospital lab) Nasopharyngeal Nasopharyngeal Swab      Status: None   Collection Time: 12/27/19  1:25 PM   Specimen: Nasopharyngeal Swab  Result Value Ref Range Status   SARS Coronavirus 2 NEGATIVE NEGATIVE Final    Comment: (NOTE) SARS-CoV-2 target nucleic acids are NOT DETECTED.  The SARS-CoV-2 RNA is generally detectable in upper and lower respiratory specimens during the acute phase of infection. The lowest concentration of SARS-CoV-2 viral copies this assay can detect is 250 copies /  mL. A negative result does not preclude SARS-CoV-2 infection and should not be used as the sole basis for treatment or other patient management decisions.  A negative result may occur with improper specimen collection / handling, submission of specimen other than nasopharyngeal swab, presence of viral mutation(s) within the areas targeted by this assay, and inadequate number of viral copies (<250 copies / mL). A negative result must be combined with clinical observations, patient history, and epidemiological information.  Fact Sheet for Patients:   BoilerBrush.com.cyhttps://www.fda.gov/media/136312/download  Fact Sheet for Healthcare Providers: https://pope.com/https://www.fda.gov/media/136313/download  This test is not yet approved or  cleared by the [place] FDA and has been authorized for detection and/or diagnosis of SARS-CoV-2 by FDA under an Emergency Use Authorization (EUA).  This EUA will remain in effect (meaning this test can be used) for the duration of the COVID-19 declaration under Section 564(b)(1) of the Act, 21 U.S.C. section 360bbb-3(b)(1), unless the authorization is terminated or revoked sooner.  Performed at [facility], [address] [place], [place] [address]   Urine culture     Status: Abnormal   Collection Time: 12/27/19  5:31 PM   Specimen: Urine, Clean Catch  Result Value Ref Range Status   Specimen Description URINE, CLEAN CATCH  Final   Special Requests   Final    NONE Performed at [facility], [address] [place],  [place] [id number]    Culture MULTIPLE SPECIES PRESENT, SUGGEST RECOLLECTION (A)  Final   Report Status 12/28/2019 FINAL  Final         Radiology Studies: No results found.      Scheduled Meds: . amiodarone  200 mg Oral BID   Followed by  . [START ON 01/13/2020] amiodarone  200 mg Oral Daily  . enoxaparin (LOVENOX) injection  40 mg Subcutaneous Q24H  . feeding supplement (ENSURE ENLIVE)  237 mL Oral TID BM  . multivitamin with minerals  1 tablet Oral Daily  . nebivolol  5 mg Oral Daily  . pantoprazole  40 mg Oral QHS  . sodium chloride flush  5 mL Intracatheter Q8H   Continuous Infusions: . ampicillin (OMNIPEN) IV 1 g (01/01/20 1321)     LOS: 5 days    Time spent: 25 minutes    Alberteen Samhristopher P Jeremiah Tarpley, MD [facility] 01/01/2020, 6:35 PM     Please page though AMION or Epic secure chat:  For [employer] password, Higher education careers advisercontact charge nurse

## 2020-01-01 NOTE — TOC Progression Note (Signed)
Transition of Care [facility]) - Progression Note    Patient Details  Name: [name] MRN: [id number] Date of Birth: 08/08/1928  [facility]) CM/SW Contact  Beckie Busing, RN Phone Number: [phone]  01/01/2020, 10:41 AM  Clinical Narrative:    [facility] consulted for Hospice referral. Patient is to go back to [facility] with hospice to follow. Spoke with niece [name] and was given permission to consult [facility] for Hospice services. Referral made with Norm Parcel at [facility]. TOC will continue to follow for needs.     Barriers to Discharge: Continued Medical Work up  Expected Discharge Plan and Services   In-house Referral: NA Discharge Planning Services: CM Consult Post Acute Care Choice: Nursing Home Living arrangements for the past 2 months: Skilled Nursing Facility                 DME Arranged: N/A DME Agency: NA       HH Arranged: NA HH Agency: NA         Social Determinants of Health (SDOH) Interventions    Readmission Risk Interventions No flowsheet data found.

## 2020-01-01 NOTE — Progress Notes (Signed)
Physical Therapy Treatment Patient Details Name: [name] MRN: [id number] DOB: 03/31/1929 Today's Date: 01/01/2020    History of Present Illness 86yo female presenting from SNF minimally responsive and with reported complaints of SOB, N/V, diarrhea, CP and with multiple concerns for SIRS with multiorgan dysfunction, imaging with R hydronephrosis near the UPJ. Received nephrostomy 8/1. Covid testing ultimately negative. Admitted with SIRS,  metabolic encephalopahty. PMH CAD s/p CABG, CHF, cerebrovascular disease, hx of T8 and L1 fractures, HTN, HLD, LBP, CVA, cardiac cath, hx partial colectomy, L knee surgery    PT Comments    Patient received in bed, able to answer A&O questions well but otherwise very functionally confused- made many confused statements today including "is it lasanga? Where is it??" and had a difficult time following cues. Found to be horribly and completely soiled in liquid BM and urine, needed totalAx2 for all rolling tasks and pericare today. Unable again to describe what she has been able to do mobility wise at her facility. Based on contractures in bilateral ankles and limited mobility and knees and hips as well as very poor tolerance to mobility as a whole, suspect she is very likely at her functional baseline. Left in bed with all needs met, NT present and attending. PT signing off for now as she is likely at her functional baseline- if she has a status change and requires further skilled PT services, please place new order. Thank you!!!     Follow Up Recommendations  SNF;Supervision/Assistance - 24 hour     Equipment Recommendations  None recommended by PT (defer to next venue)    Recommendations for Other Services       Precautions / Restrictions Precautions Precautions: Fall;Other (comment) Precaution Comments: watch HR Restrictions Weight Bearing Restrictions: No    Mobility  Bed Mobility Overal bed mobility: Needs Assistance Bed Mobility:  Rolling Rolling: Total assist         General bed mobility comments: totalA for rolling side to side for pericare, patient yelling in pain every time she is touched or when we attempt mobility  Transfers                 General transfer comment: deferred- pain and resistance  Ambulation/Gait             General Gait Details: deferred- pain and resistance   Stairs             Wheelchair Mobility    Modified Rankin (Stroke Patients Only)       Balance Overall balance assessment: Needs assistance Sitting-balance support: No upper extremity supported;Feet unsupported Sitting balance-Leahy Scale: Poor                                      Cognition Arousal/Alertness: Awake/alert Behavior During Therapy: Flat affect;Anxious Overall Cognitive Status: History of cognitive impairments - at baseline                                 General Comments: able to squeeze out correct answers to A&O questions but functinoally VERY confused, states "isnt' it lasanga???? where is it?" and many other confused, nonsensical statements      Exercises      General Comments        Pertinent Vitals/Pain Pain Assessment: Faces Faces Pain Scale: Hurts whole lot Pain Location: all over,  every single body part when touched or moved Pain Descriptors / Indicators: Guarding;Grimacing Pain Intervention(s): Limited activity within patient's tolerance;Monitored during session;Repositioned    Home Living                      Prior Function            PT Goals (current goals can now be found in the care plan section) Acute Rehab PT Goals PT Goal Formulation: Patient unable to participate in goal setting Time For Goal Achievement: 01/13/20 Potential to Achieve Goals: Fair Progress towards PT goals: Progressing toward goals    Frequency    Other (Comment) (PT signing off)      PT Plan Other (comment) (PT signing off)     Co-evaluation              AM-PAC PT "6 Clicks" Mobility   Outcome Measure  Help needed turning from your back to your side while in a flat bed without using bedrails?: Total Help needed moving from lying on your back to sitting on the side of a flat bed without using bedrails?: Total Help needed moving to and from a bed to a chair (including a wheelchair)?: Total Help needed standing up from a chair using your arms (e.g., wheelchair or bedside chair)?: Total Help needed to walk in hospital room?: Total Help needed climbing 3-5 steps with a railing? : Total 6 Click Score: 6    End of Session   Activity Tolerance: Patient limited by pain Patient left: in bed;with call bell/phone within reach (in care of NT) Nurse Communication: Mobility status;Other (comment) (with more investigation patient at baseline, PT signing off) PT Visit Diagnosis: Muscle weakness (generalized) (M62.81);Unsteadiness on feet (R26.81);Difficulty in walking, not elsewhere classified (R26.2);Adult, failure to thrive (R62.7)     Time: 0569-7948 PT Time Calculation (min) (ACUTE ONLY): 28 min  Charges:  $Therapeutic Activity: 23-37 mins                     Windell Norfolk, DPT, PN1   Supplemental Physical Therapist [place]    Pager [phone] Acute Rehab Office [phone]

## 2020-01-01 NOTE — Consult Note (Signed)
Palliative Medicine Inpatient Consult Note  Reason for consult:  Goals of Care "large kidney stones, likely not able to tolerate surgery -->  may be a terminal issue"  HPI:  Per intake H&P -->  [name] is a 86 y.o. F with CAD status post CABG 71, D CHF, HTN, PVD status post CEA, CVA, PAF, and asthma who presented with decreased mentation and lethargy and confusion. Found to have Proteus bacteremia.  Palliative care was asked to get involved in the setting of initiating goals of care conversations d/t patients inability to get surgery for kidney stones r/t associate risk. Discuss likelihood of ongoing infections and the terminal nature of present illness.  Clinical Assessment/Goals of Care: I have reviewed medical records including EPIC notes, labs and imaging, received report from bedside RN, assessed the patient who was sitting upright requesting orange juice.    I met with [name], [name] (via speaker phone), and [name]  to further discuss diagnosis prognosis, GOC, EOL wishes, disposition and options.   I introduced Palliative Medicine as specialized medical care for people living with serious illness. It focuses on providing relief from the symptoms and stress of a serious illness. The goal is to improve quality of life for both the patient and the family.  I asked [name] to tell me about herself. She shares that she is from [place], [place]. She was married to her husband, [name] for > 20 years though he passed away four years ago. They never had children as she got married later in life in her 2. [name]'s nieces and nephews have had an active role in her life. 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 [name] goes on to tell me that traveling outside of the state was very important to her and she feels glad that she was able to accomplish doing this. She states  that she use to have a cocker spaniel and poodles. She expresses that animals bring her tremendous joy. She is a woman of faith and practices within the [facility] denomination.   Patient has for the last year been dependent on assistance for bADLs. She suffered multiple falls in her home a year ago due to her ambivalence to use her walker. Per [name] her mental and physical state has worsened in the setting of COVID restrictions at [facility]. She has gradually deteriorated over this last year to the point where she will likely not be able to go back to her home.   [name] shares that she understands that her "kidney thing" brought her to the hospice. She also goes on to say that she has a "weak heart". She recounts her falls over the last year, her broken hip and ankle injury.   [name] shared with [name] and [name] the "best case" and "worst case" scenarios with her present situation. Best case inclusive of nephrostomy tube, suppressive oral antibiotics, and transition to [facility] on hospice enabling dignity and quality. Worst case included removing nephrotomy tube, recurrent infections +/- surgery resulting in blood loss, kidney failure, and pain. He provided a comprehensive medical update to [name] and her niece which was of tremendous value to aid in their decision making.   A detailed discussion was had today regarding advanced directives, [name] does not have any of these on file. She vocalizes that if she were unable to make decisions for herself she would rely on her  niece, [name] to make decisions for her.    Concepts specific to code status, artifical feeding and hydration, continued IV antibiotics and rehospitalization was had.  I completed a MOST form today. The patient and family outlined their wishes for the following treatment decisions:  Cardiopulmonary Resuscitation: Do Not Attempt Resuscitation (DNR/No CPR)  Medical Interventions: Limited Additional Interventions: Use medical  treatment, IV fluids and cardiac monitoring as indicated, DO NOT USE intubation or mechanical ventilation. May consider use of less invasive airway support such as BiPAP or CPAP. Also provide comfort measures. Transfer to the hospital if indicated. Avoid intensive care.   Antibiotics: Determine use of limitation of antibiotics when infection occurs  IV Fluids: IV fluids for a defined trial period  Feeding Tube: No feeding tube   The difference between a aggressive medical intervention path  and a palliative comfort care path for this patient at this time was had.  I described hospice as a service for patients for have a life expectancy of < 84month. It preserves dignity and quality at the end phases of life. The focus changes from curative to symptom relief.   The determination was make to pursue hospice care as this most aligns with the goals [name] for herself at this point in time.   Discussed the importance of continued conversation with family and their  medical providers regarding overall plan of care and treatment options, ensuring decisions are within the context of the patients values and GOCs.  Decision Maker: [name]-[phone] SUMMARY OF RECOMMENDATIONS   DNAR/DNI  MOST Completed, paper copy placed onto the chart electric [facility] Completed, paper copy placed onto the chart electric copy can be found in [facility]  TOC --> [facility] DNAR/DNI   Palliative Prophylaxis:   Oral Care, Constipation, Mobility  Additional Recommendations (Limitations, Scope, Preferences):  Treat what is treatable   Psycho-social/Spiritual:   Desire for further Chaplaincy support: Yes - [name]  Additional Recommendations: Education on Hospice   Prognosis: Poor in the setting of recurrent infections  Discharge Planning: [place] [facility] PPS: 30%   This conversation/these recommendations were discussed with patient primary care team, Dr. DLoleta [name] Time In: 0900 Time Out: 1030 Total Time: 90 Greater than 50%  of this time was spent counseling and coordinating care related to the above assessment and plan.  [place] Team Cell Phone: [phone] utilize secure chat with additional questions, if there is no response within 30 minutes please call the above phone number  Palliative Medicine Team providers are available by phone from 7am to 7pm daily and can be reached through the team cell phone.  Should this patient require assistance outside of these hours, please call the patient's attending physician.

## 2020-01-02 LAB — SARS CORONAVIRUS 2 BY RT PCR (HOSPITAL ORDER, PERFORMED IN ~~LOC~~ HOSPITAL LAB): SARS Coronavirus 2: NEGATIVE

## 2020-01-02 MED ORDER — NEBIVOLOL HCL 5 MG PO TABS: 5.0000 mg | ORAL_TABLET | Freq: Every day | ORAL | Status: AC

## 2020-01-02 MED ORDER — OXYCODONE HCL 5 MG PO TABS: 10.0000 mg | ORAL_TABLET | ORAL | 0 refills | Status: AC | PRN

## 2020-01-02 MED ORDER — AMIODARONE HCL 200 MG PO TABS: 200.0000 mg | ORAL_TABLET | Freq: Every day | ORAL | 0 refills | Status: AC

## 2020-01-02 MED ORDER — AMIODARONE HCL 200 MG PO TABS: 200.0000 mg | ORAL_TABLET | Freq: Two times a day (BID) | ORAL | 0 refills | Status: AC

## 2020-01-02 MED ORDER — CEPHALEXIN 500 MG PO CAPS: 500.0000 mg | ORAL_CAPSULE | Freq: Four times a day (QID) | ORAL | 0 refills | Status: AC

## 2020-01-02 MED ORDER — CEPHALEXIN 250 MG PO CAPS: 250.0000 mg | ORAL_CAPSULE | Freq: Every day | ORAL | 11 refills | Status: AC

## 2020-01-02 NOTE — Discharge Summary (Addendum)
Physician Discharge Summary  [name]:[id number] DOB: May 22, 1929 DOA: 12/27/2019  PCP: Donita Brooks, MD  Admit date: 12/27/2019 Discharge date: 01/03/2020  Admitted From: SNF  Disposition:  SNF with Hospice   Recommendations for Outpatient Follow-up:  1. Make appointment to follow up with Dr. Grace Isaac, [facility] Interventional Radiology 2. Make appointment to follow up with Dr. Sande Brothers, [facility] if questions of prognosis arise 3. Assist patient to enroll in Hospice     Home Health: N/A  Equipment/Devices: N/A  Discharge Condition: Fair  CODE STATUS: DNR Diet recommendation: Cardiac, dysphagia diet with thin liquids, finely chopped foods  Brief/Interim Summary: [name] is a 86 y.o. F with CAD status post CABG 36, D CHF, HTN, PVD status post CEA, CVA, PAF, and asthma who presented with decreased mentation and lethargy and confusion.  Found to have Proteus bacteremia and bilateral renal stones (1.2cm stone on the right with hydronephrosis and staghorn calculus in the left).  Urology were consulted and the patient was started on empiric antibiotics.     PRINCIPAL HOSPITAL DIAGNOSIS: Severe sepsis    Discharge Diagnoses:   Severe sepsis due to UTI/Proteus bacteremia, due to bilateral nephrolithiasis with hydronephrosis requiring nephrostomy tube placement Status post nephrostomy tube placement 8/1 by Dr. Grace Isaac Presented with tachycardia, leukocytosis, fever, suspected UTI, and evidence of endorgan failure due to sepsis (qSOFA 1 for encephalopathy).  Started on empiric antibiotics.  Size of the right renal stone and patient instability precluded safe stenting and so IR were consulted for urgent nephrostomy tube placement.    Blood and urine cultures grew beta-lactam sensitive Proteus.  Patient defervesced.  She should complete 10 days antibiotics with cephalexin.    Bilateral nephrolithiasis CT showed 1.2cm stone on right and staghorn calculus on the left.   The case was discussedin depth with Dr. Sande Brothers.  We agree that with her age, malnutrition, encephalopathy, renal function and Afib, the mortality risk from bilateral ureterotomy to remove the stones is unacceptably high, and the chance of success without complications from the number of repeat ureteroscopies that would be needed to remove these stones with lithotripsy is also unacceptable.  In light of that, her stones are likely permanent, and this appears to be a terminal disease.  This was discussed with palliative care, the patient, myself and HCPOA/family.  Given her life expectancy is uncertain but likely less than 6 months, we recommend Hospice, and patient agreed.  She should continue nephrostomy tube indefinitely and follow up with IR to determine when this should be exchanged.  She should remain on suppressive antibiotics indefinitely with cephalexin 250 mg daily.  She should follow up with Urology if questions of prognosis are needed. Dr. Alphonsa Overall contact information is provided.   Paroxysmal atrial fibrillation Patient developed rapid Afib.  Cardiology consulted, amiodarone was started.    Cardiology intend 6 weeks amiodarone then stop.  She may follow up as an outpatient, pending her clinical course and goals of care.    Continue amiodarone and nadolol.  Acute metabolic encephalopathy now with cognitive impairment The patient was living independently prior to moving into [facility].  She had no diagnosis of dementia prior, but developed acute metabolic encephaloapthy and delirium during this episode of sepsis.  Her delirium is resolved, but she appears to have some permanent residual cognitive impairment. This may improve somewhat, but unlikely.   Acute renal failure due to sepsis Resolved  Hypomagnesemia Resolved  Hypokalemia Resolved  Elevated troponin Doubt ACS, likely demand ischemia  Chronic  diastolic CHF  Anemia  Severe protein calorie  malnutrition  Cerebrovascular disease Peripheral vascular disease Hypertension Stop Imdur due to low blood pressure.  Contineu aspirin and statin.  may revisit by Hospice.  GERD Continue PPI  Mood disorder Continue Lexapro   Dysphagia Finely chopped diet.  Severe Protein calorie malnutrition            Discharge Instructions  Discharge Instructions    Discharge instructions   Complete by: As directed    Follow up with Hospice  For the infection: Take cephalexin 500 four times daily for 2 days then after that reduce to 250 mg once daily indefinitely for prevention of infection  Folow up with Interventional Radiology as directed  Follow up with Dr. Sande Brothers, Urology in 2-3 weeks  For the heart rhythm: Take amiodarone 200 mg twice daily for 10 days Then on Aug 18, reduce to amiodarone 200 mg once daily for 1 month  Follow up with Dr. Bjorn Pippin within 1 month   Increase activity slowly   Complete by: As directed    No dressing needed   Complete by: As directed      Allergies as of 01/03/2020      Reactions   Metoprolol Other (See Comments)   Allopecia. Pt is ok with using this short-term   Phenergan [promethazine Hcl] Other (See Comments)   Facial numbness   Prochlorperazine Edisylate Anaphylaxis   Zetia [ezetimibe] Nausea Only, Other (See Comments)   Abdominal pain   Compazine [prochlorperazine]    Facial  Paralysis    Sulfur Other (See Comments)   paralyzes  face   Niacin Rash   Sulfonamide Derivatives Nausea And Vomiting      Medication List    STOP taking these medications   HYDROcodone-acetaminophen 5-325 MG tablet Commonly known as: NORCO/VICODIN   isosorbide mononitrate 30 MG 24 hr tablet Commonly known as: IMDUR   isosorbide mononitrate 60 MG 24 hr tablet Commonly known as: IMDUR   nitroGLYCERIN 0.4 MG SL tablet Commonly known as: NITROSTAT     TAKE these medications   acetaminophen 325 MG tablet Commonly known as:  TYLENOL Take 2 tablets (650 mg total) by mouth every 6 (six) hours as needed for mild pain, fever or headache (or Fever >/= 101).   amiodarone 200 MG tablet Commonly known as: PACERONE Take 1 tablet (200 mg total) by mouth 2 (two) times daily.   amiodarone 200 MG tablet Commonly known as: PACERONE Take 1 tablet (200 mg total) by mouth daily. Start taking on: January 13, 2020   aspirin EC 81 MG tablet Take 1 tablet (81 mg total) by mouth daily with breakfast.   cephALEXin 500 MG capsule Commonly known as: KEFLEX Take 1 capsule (500 mg total) by mouth 4 (four) times daily for 2 days.   cephALEXin 250 MG capsule Commonly known as: KEFLEX Take 1 capsule (250 mg total) by mouth daily. Start taking on: January 04, 2020   docusate sodium 100 MG capsule Commonly known as: COLACE Take 1 capsule (100 mg total) by mouth 2 (two) times daily.   escitalopram 10 MG tablet Commonly known as: LEXAPRO Take 15 mg by mouth daily.   esomeprazole 20 MG capsule Commonly known as: NEXIUM Take 20 mg by mouth daily at 12 noon.   gabapentin 100 MG capsule Commonly known as: NEURONTIN Take 100 mg by mouth 2 (two) times daily.   guaiFENesin 600 MG 12 hr tablet Commonly known as: MUCINEX Take 600 mg by mouth 2 (  two) times daily.   lidocaine 5 % Commonly known as: LIDODERM Place 1 patch onto the skin 2 (two) times daily. Remove & Discard patch within 12 hours or as directed by MD   melatonin 5 MG Tabs Take 5 mg by mouth at bedtime.   nebivolol 5 MG tablet Commonly known as: BYSTOLIC Take 1 tablet (5 mg total) by mouth daily. What changed:   medication strength  how much to take  additional instructions   oxyCODONE 5 MG immediate release tablet Commonly known as: Oxy IR/ROXICODONE Take 2 tablets (10 mg total) by mouth every 4 (four) hours as needed for severe pain.   OYSTER SHELL CALCIUM 500 + D PO Take 1 tablet by mouth in the morning and at bedtime.   potassium chloride 10 MEQ CR  capsule Commonly known as: MICRO-K Take 1 capsule (10 mEq total) by mouth daily.   rosuvastatin 20 MG tablet Commonly known as: CRESTOR Take 1 tablet (20 mg total) by mouth daily. Pt needs to keep appt in Oct for further refills   Vitamin D3 1.25 MG (50000 UT) Tabs Take 1 tablet by mouth daily.            Discharge Care Instructions  (From admission, onward)         Start     Ordered   01/02/20 0000  No dressing needed        01/02/20 1155          Follow-up Information    Rene Paci, MD. Schedule an appointment as soon as possible for a visit in 2 weeks.   Specialty: Urology Contact information: [address] [place] [address] [phone]        Dyann Kief, PA-C Follow up on 02/02/2020.   Specialty: Cardiology Why: apt time 8:45 AM Contact information: [address] [place] [address] [phone]        Simonne Come, MD. Schedule an appointment as soon as possible for a visit in 1 week(s).   Specialties: Interventional Radiology, Radiology Contact information: [address] [place] [address] [phone]              Allergies  Allergen Reactions  . Metoprolol Other (See Comments)    Allopecia. Pt is ok with using this short-term  . Phenergan [Promethazine Hcl] Other (See Comments)    Facial numbness  . Prochlorperazine Edisylate Anaphylaxis  . Zetia [Ezetimibe] Nausea Only and Other (See Comments)    Abdominal pain  . Compazine [Prochlorperazine]     Facial  Paralysis       . Sulfur Other (See Comments)    paralyzes  face   . Niacin Rash  . Sulfonamide Derivatives Nausea And Vomiting    Consultations:  Urology  Cardiology  Interventional Radiology   Procedures/Studies: DG Chest 1 View  Result Date: 12/27/2019 CLINICAL DATA:  Shortness of breath and respiratory failure EXAM: CHEST  1 VIEW COMPARISON:  April 09, 2019 FINDINGS: There is mild  cardiomegaly. Aortic knob calcifications are seen. Overlying median sternotomy wires are present. Patchy airspace opacity seen within the left lung base and right lung base. There is diffusely increased interstitial markings throughout both lungs. No acute osseous abnormality. Cement fixation seen within the mid thoracic spine. IMPRESSION: Patchy interstitial airspace opacities throughout both lungs which could be due to asymmetric edema and/or infectious etiology Electronically Signed   By: Jonna Clark M.D.   On: 12/27/2019 20:45  CT ABDOMEN PELVIS W CONTRAST  Result Date: 12/27/2019 CLINICAL DATA:  Infectious gastroenteritis or colitis. EXAM: CT ABDOMEN AND PELVIS WITH CONTRAST TECHNIQUE: Multidetector CT imaging of the abdomen and pelvis was performed using the standard protocol following bolus administration of intravenous contrast. CONTRAST:  76mL OMNIPAQUE IOHEXOL 300 MG/ML  SOLN COMPARISON:  09/10/2018 FINDINGS: Lower chest: Heart is enlarged. Left base atelectasis or infiltrate. Hepatobiliary: Small area of low attenuation in the anterior liver, adjacent to the falciform ligament, is in a characteristic location for focal fatty deposition in similar to prior. Probable tiny layering gallstones. No intrahepatic or extrahepatic biliary dilation. Pancreas: No focal mass lesion. No dilatation of the main duct. No intraparenchymal cyst. No peripancreatic edema. Spleen: No splenomegaly. No focal mass lesion. Adrenals/Urinary Tract: No adrenal nodule or mass. 2 x 4 mm nonobstructing stone identified posterior interpolar right kidney. A second tiny 2 mm posterior interpolar right renal stone evident. There is mild right hydronephrosis secondary to the presence of a linear 3 x 6 x 12 mm stone at or just distal to the right UPJ. Urothelial enhancement around the stone is likely secondary to irritation. There is some fullness in the right ureter below this stone but no additional right ureteral stone evident. There  is a phlebolith in the right pelvis seen to be below the ureter on coronal image 47 of series 7. Clustered stones likely with staghorn component identified in the left renal pelvis extending into upper and lower pole collecting systems. No substantial hydronephrosis. No left hydroureter. There is irregular circumferential bladder wall thickening with possible left superolateral bladder diverticulum on coronal image 40 of series 7. Stomach/Bowel: Small hiatal hernia. Duodenum is normally positioned as is the ligament of Treitz. No small bowel wall thickening. No small bowel dilatation. The terminal ileum is normal. No gross colonic mass. No colonic wall thickening. Diverticular changes are noted in the left colon without evidence of diverticulitis. Mild wall thickening in the mid sigmoid colon may be related to muscular hypertrophy from the diverticulosis. No features of diverticulitis. Vascular/Lymphatic: There is abdominal aortic atherosclerosis without aneurysm. There is no gastrohepatic or hepatoduodenal ligament lymphadenopathy. No retroperitoneal or mesenteric lymphadenopathy. No pelvic sidewall lymphadenopathy. Reproductive: Fluid and gas identified in the right vaginal fornix (image 72/series 3). No obvious colovaginal fistula. Insert no adnexal mass Other: No intraperitoneal free fluid. Musculoskeletal: Bones are diffusely demineralized. Status post right hip replacement.L1 superior endplate compression fracture noted. IMPRESSION: 1. Mild right hydronephrosis secondary to the presence of a linear 3 x 6 x 12 mm stone at or just distal to the right UPJ. Urothelial enhancement around the stone is likely secondary to irritation. 2. Clustered stones likely with staghorn component identified in the left renal pelvis extending into upper and lower pole collecting systems. No substantial hydronephrosis. 3. Irregular circumferential bladder wall thickening with possible left superolateral bladder diverticulum.  Bladder infection not excluded. 4. Fluid and gas identified in the right vaginal fornix. No evidence for colovaginal fistula. Indeterminate finding. Patient did have some gas in the vaginal vault on previous CT from 09/10/2018. 5. Small hiatal hernia. 6. Left base atelectasis or infiltrate. 7. L1 superior endplate compression fracture. 8. Aortic Atherosclerosis (ICD10-I70.0). Electronically Signed   By: Kennith Center M.D.   On: 12/27/2019 17:13   DG Abd Acute W/Chest  Result Date: 12/27/2019 CLINICAL DATA:  Shortness of breath, COVID positive, nausea and vomiting. EXAM: DG ABDOMEN ACUTE W/ 1V CHEST COMPARISON:  Chest x-ray dated 04/09/2019. Abdominal x-ray dated 12/30/2013. FINDINGS: Heart size  and mediastinal contours are stable. Lungs are clear. Lungs are hyperexpanded. No pleural effusion or pneumothorax is seen. Old healed rib fractures on the LEFT. Overall bowel gas pattern is nonobstructive. No evidence of soft tissue mass or abnormal fluid collection. No evidence of free intraperitoneal air. Extensive aortic atherosclerosis. Degenerative spondylosis of the slightly scoliotic thoracolumbar spine, moderate in degree. No acute appearing osseous abnormality. IMPRESSION: 1. No active cardiopulmonary disease. No evidence of pneumonia or pulmonary edema. 2. COPD. 3. No acute findings within the abdomen or pelvis. Nonobstructive bowel gas pattern. Electronically Signed   By: Bary Richard M.D.   On: 12/27/2019 13:51   ECHOCARDIOGRAM COMPLETE  Result Date: 12/28/2019    ECHOCARDIOGRAM REPORT   Patient Name:   TERILYN SANO Date of Exam: 12/28/2019 Medical Rec #:  [id number]   Height:       64.0 in Accession #:    [id number]  Weight:       107.2 lb Date of Birth:  1929-04-06   BSA:          1.501 m Patient Age:    86 years    BP:           112/45 mmHg Patient Gender: F           HR:           80 bpm. Exam Location:  Inpatient Procedure: 2D Echo Indications:    428.31; I50.31 Acute diastolic (congestive) heart failure   History:        Patient has prior history of Echocardiogram examinations, most                 recent 07/20/2015. CHF, CAD, Prior CABG, Stroke; Risk                 Factors:Hypertension and Dyslipidemia.  Sonographer:    Celene Skeen RDCS (AE) Referring Phys: [id number] Deno Lunger [facility]  Sonographer Comments: No apical window. restricted mobility IMPRESSIONS  1. No apical images obtained to evaluate diastolic function. This study is limited for diagnostic assessment.  2. Left ventricular ejection fraction, by estimation, is 65 to 70%. The left ventricle has normal function. Left ventricular endocardial border not optimally defined to evaluate regional wall motion. There is mild asymmetric left ventricular hypertrophy  of the septal segment. Left ventricular diastolic function could not be evaluated.  3. Right ventricular systolic function is normal. The right ventricular size is normal. There is mildly elevated pulmonary artery systolic pressure. The estimated right ventricular systolic pressure is 43.1 mmHg.  4. The mitral valve is degenerative. Trivial mitral valve regurgitation.  5. The tricuspid valve is degenerative. Tricuspid valve regurgitation is mild to moderate.  6. The aortic valve is abnormal. AV is incompletely assessed. FINDINGS  Left Ventricle: Left ventricular ejection fraction, by estimation, is 65 to 70%. The left ventricle has normal function. Left ventricular endocardial border not optimally defined to evaluate regional wall motion. The left ventricular internal cavity size was normal in size. There is mild asymmetric left ventricular hypertrophy of the septal segment. Left ventricular diastolic function could not be evaluated. Right Ventricle: The right ventricular size is normal. Right vetricular wall thickness was not assessed. Right ventricular systolic function is normal. There is mildly elevated pulmonary artery systolic pressure. The tricuspid regurgitant velocity is 2.65 m/s, and with an  assumed right atrial pressure of 15 mmHg, the estimated right ventricular systolic pressure is 43.1 mmHg. Left Atrium: Left atrial size was not assessed. Right Atrium: Right atrial  size was not assessed. Pericardium: There is no evidence of pericardial effusion. Mitral Valve: The mitral valve is degenerative in appearance. Moderate mitral annular calcification. Trivial mitral valve regurgitation. Tricuspid Valve: The tricuspid valve is degenerative in appearance. Tricuspid valve regurgitation is mild to moderate. Aortic Valve: The aortic valve is abnormal. Aortic valve regurgitation is not visualized. There is moderate calcification of the aortic valve. Pulmonic Valve: The pulmonic valve was not well visualized. Pulmonic valve regurgitation is not visualized. Aorta: The aortic root is normal in size and structure. IAS/Shunts: The interatrial septum was not well visualized.  LEFT VENTRICLE PLAX 2D LVIDd:         3.10 cm LVIDs:         1.90 cm LV PW:         1.00 cm LV IVS:        1.20 cm LVOT diam:     2.00 cm LVOT Area:     3.14 cm  LEFT ATRIUM         Index LA diam:    3.40 cm 2.27 cm/m   AORTA Ao Root diam: 2.60 cm TRICUSPID VALVE TR Peak grad:   28.1 mmHg TR Vmax:        265.00 cm/s  SHUNTS Systemic Diam: 2.00 cm Weston Brass MD Electronically signed by Weston Brass MD Signature Date/Time: 12/28/2019/5:40:27 PM    Final    IR NEPHROSTOMY PLACEMENT RIGHT  Result Date: 12/27/2019 INDICATION: Urinary sepsis secondary to obstructing stone at the level of the right UPJ/superior ureter. Request made for placement of a right-sided percutaneous nephrostomy catheter for infection source control purposes. EXAM: 1. ULTRASOUND GUIDANCE FOR PUNCTURE OF THE LEFT/RIGHT RENAL COLLECTING SYSTEM 2. RIGHT PERCUTANEOUS NEPHROSTOMY TUBE PLACEMENT. COMPARISON:  CT abdomen pelvis-12/27/2019 MEDICATIONS: Patient has received multiple intravenous antibiotics while in the emergency department.; The antibiotic was administered in  an appropriate time frame prior to skin puncture. ANESTHESIA/SEDATION: Moderate (conscious) sedation was employed during this procedure. A total of Versed 0.5 mg and Fentanyl 25 mcg was administered intravenously. Moderate Sedation Time: 17 minutes. The patient's level of consciousness and vital signs were monitored continuously by radiology nursing throughout the procedure under my direct supervision. CONTRAST:  10 cc mL Isovue 300 - administered into the renal collecting system FLUOROSCOPY TIME:  3 minutes, 24 seconds (22 mGy) COMPLICATIONS: None immediate. PROCEDURE: The procedure, risks, benefits, and alternatives were explained to the patient. Questions regarding the procedure were encouraged and answered. The patient understands and consents to the procedure. A timeout was performed prior to the initiation of the procedure. The right flank region was prepped and draped in the usual sterile fashion and a sterile drape was applied covering the operative field. A sterile gown and sterile gloves were used for the procedure. Local anesthesia was provided with 1% Lidocaine with epinephrine. Ultrasound was used to localize the right kidney. Attempts were made to access a posterior calyx with ultrasound guidance however this proved difficult secondary to angulation of kidney as well as patient's positioning on the fluoroscopy table. Fluoroscopic imaging demonstrated contrast from preceding abdominal CT within the right renal collecting system. As such, a posteroinferior calyx was targeted fluoroscopically with a 20 gauge needle. Access to the collecting system was confirmed with advancement of Nitrex wire to the level the right renal pelvis. Over a Nitrex wire, the tract was dilated with an Accustick stent. Next, under intermittent fluoroscopic guidance and over a short Amplatz wire, the track was dilated ultimately allowing placement of a 10-French percutaneous  nephrostomy catheter which was advanced to the level of  the renal pelvis where the coil was formed and locked. Contrast was injected and several spot fluoroscopic images were obtained in various obliquities. The catheter was secured at the skin with a Prolene retention suture and stat lock device and connected to a gravity bag was placed. Dressings were applied. The patient tolerated procedure well without immediate postprocedural complication. FINDINGS: Ultrasound scanning demonstrates a mildly dilated right renal collecting system. Under fluoroscopic guidance, a contrast opacified posteroinferior calyx was targeted allowing placement of a 10-French percutaneous nephrostomy catheter with end coiled and locked within the renal pelvis. Contrast injection confirmed appropriate positioning. IMPRESSION: Successful ultrasound and fluoroscopic guided placement of a right sided 10 French PCN. Electronically Signed   By: Simonne Come M.D.   On: 12/27/2019 21:18      Subjective: NO fever.  Memory still impaired.  Nausea this morning without vomiting, now resolved.  Still very weak and uncomfortable, no focal pain, no chest pain, headache, focal abdominal pain.  Discharge Exam: Vitals:   01/02/20 2106 01/03/20 0745  BP: (!) 128/56 (!) 119/49  Pulse: 70 67  Resp: 14 17  Temp: 98.7 F (37.1 C) 98.9 F (37.2 C)  SpO2: 94% 93%   Vitals:   01/02/20 1327 01/02/20 2106 01/03/20 0500 01/03/20 0745  BP: (!) 138/51 (!) 128/56  (!) 119/49  Pulse: 64 70  67  Resp:  14  17  Temp:  98.7 F (37.1 C)  98.9 F (37.2 C)  TempSrc:  Oral    SpO2: 94% 94%  93%  Weight:   56.9 kg   Height:        General: Elderly female, lying in bed, feeding herself breakfast Cardiovascular: Irr Irregular, normal rate, soft SEM unchanged.  No LE edema   Respiratory: Respiratory effort normal, no rales or wheezing  Abdominal: Abdomen soft, mild diffuse TTP, no rigidity or guarding.   Neuro/Psych: Severe generalized weakness, judgment and insight midldy impaired.  Oreinted to self,  place, situation.      The results of significant diagnostics from this hospitalization (including imaging, microbiology, ancillary and laboratory) are listed below for reference.     Microbiology: Recent Results (from the past 240 hour(s))  Blood Culture (routine x 2)     Status: Abnormal   Collection Time: 12/27/19  1:01 PM   Specimen: BLOOD  Result Value Ref Range Status   Specimen Description BLOOD SITE NOT SPECIFIED  Final   Special Requests   Final    BOTTLES DRAWN AEROBIC AND ANAEROBIC Blood Culture adequate volume   Culture  Setup Time   Final    GRAM NEGATIVE RODS Organism ID to follow CRITICAL RESULT CALLED TO, READ BACK BY AND VERIFIED WITH: A. Artis Flock PharmD 15:00 12/28/19 ([email or web address]) Performed at [facility], [address], [place], [place] [address]    Culture PROTEUS MIRABILIS (A)  Final   Report Status 12/30/2019 FINAL  Final   Organism ID, Bacteria PROTEUS MIRABILIS  Final      Susceptibility   Proteus mirabilis - MIC*    AMPICILLIN <=2 SENSITIVE Sensitive     CEFAZOLIN <=4 SENSITIVE Sensitive     CEFEPIME <=0.12 SENSITIVE Sensitive     CEFTAZIDIME <=1 SENSITIVE Sensitive     CEFTRIAXONE <=0.25 SENSITIVE Sensitive     CIPROFLOXACIN >=4 RESISTANT Resistant     GENTAMICIN <=1 SENSITIVE Sensitive     IMIPENEM 2 SENSITIVE Sensitive     TRIMETH/SULFA >=320 RESISTANT Resistant  AMPICILLIN/SULBACTAM <=2 SENSITIVE Sensitive     PIP/TAZO <=4 SENSITIVE Sensitive     * PROTEUS MIRABILIS  Blood Culture ID Panel (Reflexed)     Status: Abnormal   Collection Time: 12/27/19  1:01 PM  Result Value Ref Range Status   Enterococcus species NOT DETECTED NOT DETECTED Final   Listeria monocytogenes NOT DETECTED NOT DETECTED Final   Staphylococcus species NOT DETECTED NOT DETECTED Final   Staphylococcus aureus (BCID) NOT DETECTED NOT DETECTED Final   Streptococcus species NOT DETECTED NOT DETECTED Final   Streptococcus agalactiae NOT DETECTED NOT DETECTED Final    Streptococcus pneumoniae NOT DETECTED NOT DETECTED Final   Streptococcus pyogenes NOT DETECTED NOT DETECTED Final   Acinetobacter baumannii NOT DETECTED NOT DETECTED Final   Enterobacteriaceae species DETECTED (A) NOT DETECTED Final    Comment: Joretta Bachelor PharmD 1500 12/28/19 ([email or web address]) Enterobacteriaceae represent a large family of gram-negative bacteria, not a single organism.    Enterobacter cloacae complex NOT DETECTED NOT DETECTED Final   Escherichia coli NOT DETECTED NOT DETECTED Final   Klebsiella oxytoca NOT DETECTED NOT DETECTED Final   Klebsiella pneumoniae NOT DETECTED NOT DETECTED Final   Proteus species DETECTED (A) NOT DETECTED Final    Comment: CRITICAL RESULT CALLED TO, READ BACK BY AND VERIFIED WITH: A. Artis Flock PharmD 15:00 12/28/19 ([email or web address])    Serratia marcescens NOT DETECTED NOT DETECTED Final   Haemophilus influenzae NOT DETECTED NOT DETECTED Final   Neisseria meningitidis NOT DETECTED NOT DETECTED Final   Pseudomonas aeruginosa NOT DETECTED NOT DETECTED Final   Candida albicans NOT DETECTED NOT DETECTED Final   Candida glabrata NOT DETECTED NOT DETECTED Final   Candida krusei NOT DETECTED NOT DETECTED Final   Candida parapsilosis NOT DETECTED NOT DETECTED Final   Candida tropicalis NOT DETECTED NOT DETECTED Final    Comment: Performed at [facility], [address] [place], [place] [address]  SARS Coronavirus 2 by RT PCR (hospital order, performed in [facility] hospital lab) Nasopharyngeal Nasopharyngeal Swab     Status: None   Collection Time: 12/27/19  1:25 PM   Specimen: Nasopharyngeal Swab  Result Value Ref Range Status   SARS Coronavirus 2 NEGATIVE NEGATIVE Final    Comment: (NOTE) SARS-CoV-2 target nucleic acids are NOT DETECTED.  The SARS-CoV-2 RNA is generally detectable in upper and lower respiratory specimens during the acute phase of infection. The lowest concentration of SARS-CoV-2 viral copies this assay can detect is 250 copies / mL. A negative  result does not preclude SARS-CoV-2 infection and should not be used as the sole basis for treatment or other patient management decisions.  A negative result may occur with improper specimen collection / handling, submission of specimen other than nasopharyngeal swab, presence of viral mutation(s) within the areas targeted by this assay, and inadequate number of viral copies (<250 copies / mL). A negative result must be combined with clinical observations, patient history, and epidemiological information.  Fact Sheet for Patients:   BoilerBrush.com.cy  Fact Sheet for Healthcare Providers: https://pope.com/  This test is not yet approved or  cleared by the [place] FDA and has been authorized for detection and/or diagnosis of SARS-CoV-2 by FDA under an Emergency Use Authorization (EUA).  This EUA will remain in effect (meaning this test can be used) for the duration of the COVID-19 declaration under Section 564(b)(1) of the Act, 21 U.S.C. section 360bbb-3(b)(1), unless the authorization is terminated or revoked sooner.  Performed at [facility], [address] [place],  [place] [address]   Urine culture     Status: Abnormal   Collection Time: 12/27/19  5:31 PM   Specimen: Urine, Clean Catch  Result Value Ref Range Status   Specimen Description URINE, CLEAN CATCH  Final   Special Requests   Final    NONE Performed at [facility], [address], [place], [place] [address]    Culture MULTIPLE SPECIES PRESENT, SUGGEST RECOLLECTION (A)  Final   Report Status 12/28/2019 FINAL  Final  SARS Coronavirus 2 by RT PCR (hospital order, performed in [facility] hospital lab) Nasopharyngeal Nasopharyngeal Swab     Status: None   Collection Time: 01/02/20 11:45 AM   Specimen: Nasopharyngeal Swab  Result Value Ref Range Status   SARS Coronavirus 2 NEGATIVE NEGATIVE Final    Comment: (NOTE) SARS-CoV-2 target nucleic acids are  NOT DETECTED.  The SARS-CoV-2 RNA is generally detectable in upper and lower respiratory specimens during the acute phase of infection. The lowest concentration of SARS-CoV-2 viral copies this assay can detect is 250 copies / mL. A negative result does not preclude SARS-CoV-2 infection and should not be used as the sole basis for treatment or other patient management decisions.  A negative result may occur with improper specimen collection / handling, submission of specimen other than nasopharyngeal swab, presence of viral mutation(s) within the areas targeted by this assay, and inadequate number of viral copies (<250 copies / mL). A negative result must be combined with clinical observations, patient history, and epidemiological information.  Fact Sheet for Patients:   BoilerBrush.com.cy  Fact Sheet for Healthcare Providers: https://pope.com/  This test is not yet approved or  cleared by the [place] FDA and has been authorized for detection and/or diagnosis of SARS-CoV-2 by FDA under an Emergency Use Authorization (EUA).  This EUA will remain in effect (meaning this test can be used) for the duration of the COVID-19 declaration under Section 564(b)(1) of the Act, 21 U.S.C. section 360bbb-3(b)(1), unless the authorization is terminated or revoked sooner.  Performed at [facility], [address], [place], [place] [address]      Labs: BNP (last 3 results) No results for input(s): BNP in the last 8760 hours. Basic Metabolic Panel: Recent Labs  Lab 12/28/19 0421 12/29/19 0207 12/30/19 0223 12/31/19 0101 01/01/20 0323  NA 137 140 139 141 142  K 4.2 3.7 3.5 3.1* 4.2  CL 106 105 107 109 112*  CO2 16* 22 21* 22 19*  GLUCOSE 72 74 104* 115* 111*  BUN 15 23 23 17 13   CREATININE 0.73 1.12* 1.05* 0.97 0.85  CALCIUM 7.3* 8.5* 8.4* 8.2* 8.4*  MG 0.9* 2.3 1.8  --   --    Liver Function Tests: Recent Labs  Lab  12/27/19 1301 12/28/19 0421 12/30/19 0223 12/31/19 0101 01/01/20 0323  AST 111* 28 19 17 17   ALT 61* 23 19 17 16   ALKPHOS 112 48 68 75 67  BILITOT 1.2 0.5 1.1 0.5 0.7  PROT 6.2* 3.3* 5.1* 4.9* 4.7*  ALBUMIN 2.9* 1.4* 2.1* 2.2* 2.1*   Recent Labs  Lab 12/27/19 1301  LIPASE 20   No results for input(s): AMMONIA in the last 168 hours. CBC: Recent Labs  Lab 12/27/19 1301 12/27/19 2009 12/28/19 0421 12/29/19 0207 12/30/19 0223 12/31/19 0101 01/01/20 0323  WBC 21.7*  --  25.3* 19.9* 13.8* 10.8* 9.0  NEUTROABS 20.7*  --  22.7* 16.9* 10.8*  --   --   HGB 11.8*   < >  8.6* 9.3* 9.1* 9.8* 9.3*  HCT 38.0   < > 28.9* 30.1* 28.7* 30.3* 29.8*  MCV 93.1  --  99.3 95.0 90.8 90.4 91.4  PLT 304  --  188 204 213 220 239   < > = values in this interval not displayed.   Cardiac Enzymes: No results for input(s): CKTOTAL, CKMB, CKMBINDEX, TROPONINI in the last 168 hours. BNP: Invalid input(s): POCBNP CBG: Recent Labs  Lab 12/27/19 1255  GLUCAP 130*   D-Dimer No results for input(s): DDIMER in the last 72 hours. Hgb A1c No results for input(s): HGBA1C in the last 72 hours. Lipid Profile No results for input(s): CHOL, HDL, LDLCALC, TRIG, CHOLHDL, LDLDIRECT in the last 72 hours. Thyroid function studies No results for input(s): TSH, T4TOTAL, T3FREE, THYROIDAB in the last 72 hours.  Invalid input(s): FREET3 Anemia work up No results for input(s): VITAMINB12, FOLATE, FERRITIN, TIBC, IRON, RETICCTPCT in the last 72 hours. Urinalysis    Component Value Date/Time   COLORURINE AMBER (A) 12/27/2019 1720   APPEARANCEUR CLOUDY (A) 12/27/2019 1720   LABSPEC 1.015 12/27/2019 1720   PHURINE 8.0 12/27/2019 1720   GLUCOSEU NEGATIVE 12/27/2019 1720   HGBUR MODERATE (A) 12/27/2019 1720   BILIRUBINUR SMALL (A) 12/27/2019 1720   KETONESUR NEGATIVE 12/27/2019 1720   PROTEINUR 100 (A) 12/27/2019 1720   UROBILINOGEN 0.2 01/15/2014 2032   NITRITE NEGATIVE 12/27/2019 1720   LEUKOCYTESUR  MODERATE (A) 12/27/2019 1720   Sepsis Labs Invalid input(s): PROCALCITONIN,  WBC,  LACTICIDVEN Microbiology Recent Results (from the past 240 hour(s))  Blood Culture (routine x 2)     Status: Abnormal   Collection Time: 12/27/19  1:01 PM   Specimen: BLOOD  Result Value Ref Range Status   Specimen Description BLOOD SITE NOT SPECIFIED  Final   Special Requests   Final    BOTTLES DRAWN AEROBIC AND ANAEROBIC Blood Culture adequate volume   Culture  Setup Time   Final    GRAM NEGATIVE RODS Organism ID to follow CRITICAL RESULT CALLED TO, READ BACK BY AND VERIFIED WITH: A. Artis FlockWolfe PharmD 15:00 12/28/19 ([email or web address]) Performed at [facility], [address] [place], [place] [address]    Culture PROTEUS MIRABILIS (A)  Final   Report Status 12/30/2019 FINAL  Final   Organism ID, Bacteria PROTEUS MIRABILIS  Final      Susceptibility   Proteus mirabilis - MIC*    AMPICILLIN <=2 SENSITIVE Sensitive     CEFAZOLIN <=4 SENSITIVE Sensitive     CEFEPIME <=0.12 SENSITIVE Sensitive     CEFTAZIDIME <=1 SENSITIVE Sensitive     CEFTRIAXONE <=0.25 SENSITIVE Sensitive     CIPROFLOXACIN >=4 RESISTANT Resistant     GENTAMICIN <=1 SENSITIVE Sensitive     IMIPENEM 2 SENSITIVE Sensitive     TRIMETH/SULFA >=320 RESISTANT Resistant     AMPICILLIN/SULBACTAM <=2 SENSITIVE Sensitive     PIP/TAZO <=4 SENSITIVE Sensitive     * PROTEUS MIRABILIS  Blood Culture ID Panel (Reflexed)     Status: Abnormal   Collection Time: 12/27/19  1:01 PM  Result Value Ref Range Status   Enterococcus species NOT DETECTED NOT DETECTED Final   Listeria monocytogenes NOT DETECTED NOT DETECTED Final   Staphylococcus species NOT DETECTED NOT DETECTED Final   Staphylococcus aureus (BCID) NOT DETECTED NOT DETECTED Final   Streptococcus species NOT DETECTED NOT DETECTED Final   Streptococcus agalactiae NOT DETECTED NOT DETECTED Final   Streptococcus pneumoniae NOT DETECTED NOT DETECTED Final   Streptococcus pyogenes NOT  DETECTED NOT  DETECTED Final   Acinetobacter baumannii NOT DETECTED NOT DETECTED Final   Enterobacteriaceae species DETECTED (A) NOT DETECTED Final    Comment: Joretta Bachelor PharmD 1500 12/28/19 (wilsonm) Enterobacteriaceae represent a large family of gram-negative bacteria, not a single organism.    Enterobacter cloacae complex NOT DETECTED NOT DETECTED Final   Escherichia coli NOT DETECTED NOT DETECTED Final   Klebsiella oxytoca NOT DETECTED NOT DETECTED Final   Klebsiella pneumoniae NOT DETECTED NOT DETECTED Final   Proteus species DETECTED (A) NOT DETECTED Final    Comment: CRITICAL RESULT CALLED TO, READ BACK BY AND VERIFIED WITH: A. Artis Flock PharmD 15:00 12/28/19 (wilsonm)    Serratia marcescens NOT DETECTED NOT DETECTED Final   Haemophilus influenzae NOT DETECTED NOT DETECTED Final   Neisseria meningitidis NOT DETECTED NOT DETECTED Final   Pseudomonas aeruginosa NOT DETECTED NOT DETECTED Final   Candida albicans NOT DETECTED NOT DETECTED Final   Candida glabrata NOT DETECTED NOT DETECTED Final   Candida krusei NOT DETECTED NOT DETECTED Final   Candida parapsilosis NOT DETECTED NOT DETECTED Final   Candida tropicalis NOT DETECTED NOT DETECTED Final    Comment: Performed at [facility], [address], [place], [place] [address]  SARS Coronavirus 2 by RT PCR (hospital order, performed in [facility] hospital lab) Nasopharyngeal Nasopharyngeal Swab     Status: None   Collection Time: 12/27/19  1:25 PM   Specimen: Nasopharyngeal Swab  Result Value Ref Range Status   SARS Coronavirus 2 NEGATIVE NEGATIVE Final    Comment: (NOTE) SARS-CoV-2 target nucleic acids are NOT DETECTED.  The SARS-CoV-2 RNA is generally detectable in upper and lower respiratory specimens during the acute phase of infection. The lowest concentration of SARS-CoV-2 viral copies this assay can detect is 250 copies / mL. A negative result does not preclude SARS-CoV-2 infection and should not be used as the sole basis for treatment  or other patient management decisions.  A negative result may occur with improper specimen collection / handling, submission of specimen other than nasopharyngeal swab, presence of viral mutation(s) within the areas targeted by this assay, and inadequate number of viral copies (<250 copies / mL). A negative result must be combined with clinical observations, patient history, and epidemiological information.  Fact Sheet for Patients:   BoilerBrush.com.cy  Fact Sheet for Healthcare Providers: https://pope.com/  This test is not yet approved or  cleared by the [place] FDA and has been authorized for detection and/or diagnosis of SARS-CoV-2 by FDA under an Emergency Use Authorization (EUA).  This EUA will remain in effect (meaning this test can be used) for the duration of the COVID-19 declaration under Section 564(b)(1) of the Act, 21 U.S.C. section 360bbb-3(b)(1), unless the authorization is terminated or revoked sooner.  Performed at [facility], [address], [place], [place] [address]   Urine culture     Status: Abnormal   Collection Time: 12/27/19  5:31 PM   Specimen: Urine, Clean Catch  Result Value Ref Range Status   Specimen Description URINE, CLEAN CATCH  Final   Special Requests   Final    NONE Performed at [facility], [address], [place], [place] [address]    Culture MULTIPLE SPECIES PRESENT, SUGGEST RECOLLECTION (A)  Final   Report Status 12/28/2019 FINAL  Final  SARS Coronavirus 2 by RT PCR (hospital order, performed in [facility] hospital lab) Nasopharyngeal Nasopharyngeal Swab     Status: None   Collection Time: 01/02/20  11:45 AM   Specimen: Nasopharyngeal Swab  Result Value Ref Range Status   SARS Coronavirus 2 NEGATIVE NEGATIVE Final    Comment: (NOTE) SARS-CoV-2 target nucleic acids are NOT DETECTED.  The SARS-CoV-2 RNA is generally detectable in upper and lower respiratory specimens  during the acute phase of infection. The lowest concentration of SARS-CoV-2 viral copies this assay can detect is 250 copies / mL. A negative result does not preclude SARS-CoV-2 infection and should not be used as the sole basis for treatment or other patient management decisions.  A negative result may occur with improper specimen collection / handling, submission of specimen other than nasopharyngeal swab, presence of viral mutation(s) within the areas targeted by this assay, and inadequate number of viral copies (<250 copies / mL). A negative result must be combined with clinical observations, patient history, and epidemiological information.  Fact Sheet for Patients:   BoilerBrush.com.cy  Fact Sheet for Healthcare Providers: https://pope.com/  This test is not yet approved or  cleared by the [place] FDA and has been authorized for detection and/or diagnosis of SARS-CoV-2 by FDA under an Emergency Use Authorization (EUA).  This EUA will remain in effect (meaning this test can be used) for the duration of the COVID-19 declaration under Section 564(b)(1) of the Act, 21 U.S.C. section 360bbb-3(b)(1), unless the authorization is terminated or revoked sooner.  Performed at [facility], [address], [place], [place] [address]      Time coordinating discharge: 45 minutes The [place] controlled substances registry was reviewed for this patient prior to filling the <5 days supply controlled substances script.      SIGNED:   Alberteen Sam, MD  [facility] 01/03/2020, 9:50 AM

## 2020-01-02 NOTE — TOC Progression Note (Signed)
Transition of Care [facility]) - Progression Note    Patient Details  Name: [name] MRN: [id number] Date of Birth: 1928-08-12  Transition [facility]) CM/SW Contact  Nonda Aislynn, [place] Phone Number: 01/02/2020, 12:27 PM  Clinical Narrative:    CSW contacted by palliative about discharge plan. CSW reached out to [facility] and confirmed patient is able to discharge if paperwork is completed by family and covid has been completed. CSW updated palliative and MD.  CSW reached out to family to confirm patient readiness for discharge. Patient's niece Claris Che expressed the family is [place] the way from the beach and will not return until later in the afternoon. [facility] stated patient will have to discharge Sunday if paperwork can not be completed today. CSW confirmed family can complete paperwork Sunday and patient will discharge at that time. CSW updated MD.     Barriers to Discharge: Continued Medical Work up  Expected Discharge Plan and Services   In-house Referral: NA Discharge Planning Services: CM Consult Post Acute Care Choice: Nursing Home Living arrangements for the past 2 months: Skilled Nursing Facility Expected Discharge Date: 01/02/20               DME Arranged: N/A DME Agency: NA       HH Arranged: NA HH Agency: NA         Social Determinants of Health (SDOH) Interventions    Readmission Risk Interventions No flowsheet data found.

## 2020-01-02 NOTE — Progress Notes (Signed)
° °  Palliative Medicine Inpatient Follow Up Note  Reason for consult:  Goals of Care "large kidney stones, likely not able to tolerate surgery -->  may be a terminal issue"  HPI:  Per intake H&P -->  [name] is a35 y.o.F with CAD status post CABG 97, D CHF, HTN, PVD status post CEA, CVA, PAF, and asthma who presented with decreased mentation and lethargy and confusion. Found to have Proteus bacteremia.  Palliative care was asked to get involved in the setting of initiating goals of care conversations d/t patients inability to get surgery for kidney stones r/t associate risk. Discuss likelihood of ongoing infections and the terminal nature of present illness.  Today's Discussion (01/02/2020): Chart reviewed. Patient noted to be sitting upright in bed this morning. She expressed to me that she was frustrated as she thought she was 86 years old though her niece told her she was ten years younger. We wrote out her age and did the math together so that she could see her age.  I asked [name] if she had any questions about our conversation from the days prior. She endorsed that she did not.   I reached out to [facility] team. [facility] was [facility] yesterday. Per TOC once medically stable patient can transfer back to [facility] and they will arrange  Hospice services to resume care.  Discussed the importance of continued conversation with family and their  medical providers regarding overall plan of care and treatment options, ensuring decisions are within the context of the patients values and GOCs.  Questions and concerns addressed   SUMMARY OF RECOMMENDATIONS   DNAR/DNI  MOST Completed, paper copy placed onto the chart electric copy can be found in [place]  DNR Form Completed, paper copy placed onto the chart electric copy can be found in [place]  Plan for transition to  [facility] with [facility]   Spiritual Support  Time Spent: 25 Greater than 50% of  the time was spent in counseling and coordination of care ______________________________________________________________________________________ [facility] Palliative Medicine Team Team Cell Phone: [phone] Please utilize secure chat with additional questions, if there is no response within 30 minutes please call the above phone number  Palliative Medicine Team providers are available by phone from 7am to 7pm daily and can be reached through the team cell phone.  Should this patient require assistance outside of these hours, please call the patient's attending physician.

## 2020-01-03 NOTE — TOC Transition Note (Signed)
Transition of Care [facility]) - CM/SW Discharge Note   Patient Details  Name: [name] MRN: [id number] Date of Birth: 1929-01-14  [facility]) CM/SW Contact:  Luiz Blare Phone Number: 01/03/2020, 10:02 AM   Clinical Narrative:     Patient will DC to: [facility] Family notified: Claris Che, niece  Transport by: Sharin Mons  RN, patient, patient's family, and facility notified of DC. Discharge Summary and FL2 sent to facility. RN to call report prior to discharge [phone] Rm [phone]). DC packet on chart. Ambulance transport requested for patient.   CSW will sign off for now as social work intervention is no longer needed. Please consult [place] again if new needs arise.     Barriers to Discharge: Continued Medical Work up   Patient Goals and CMS Choice Patient states their goals for this hospitalization and ongoing recovery are:: Wants to go home CMS Medicare.gov Compare Post Acute Care list provided to:: Patient Choice offered to / list presented to : Patient  Discharge Placement                       Discharge Plan and Services In-house Referral: NA Discharge Planning Services: CM Consult Post Acute Care Choice: Nursing Home          DME Arranged: N/A DME Agency: NA       HH Arranged: NA HH Agency: NA        Social Determinants of Health (SDOH) Interventions     Readmission Risk Interventions No flowsheet data found.

## 2020-01-03 NOTE — Progress Notes (Addendum)
PROGRESS NOTE    [name]  NID:[id number] DOB: 1929-03-09 DOA: 12/27/2019 PCP: Donita Brooks, MD      Brief Narrative:  [name] is a 86 y.o. F with CAD status post CABG 81, D CHF, HTN, PVD status post CEA, CVA, PAF, and asthma who presented with decreased mentation and lethargy and confusion.  Found to have Proteus bacteremia.           Assessment & Plan:  Severe sepsis due to UTI/Proteus bacteremia, due to nephrolithiasis with hydronephrosis requiring nephrostomy tube placement This is improving Continue ampicillin   Paroxysmal Afib HR stable -Continue amiodarone and nadlol    Cerebrovascular disease Peripheral vascular disease HYpertension BP soft -Hold aspirin, Imdur, Crestor     Disposition: Status is: Inpatient  The patient was medically stable for discharge and was discharged on this date, but family were unavailable to complete paperwork for SNF admission and SNF refused patient.  She remained appropriate for hospital level care because: no safe disposition could be obtained, she requires full nursing care in SNF level setting                 MDM: The below labs and imaging reports were reviewed and summarized above.  Medication management as above.     DVT prophylaxis: enoxaparin (LOVENOX) injection 40 mg Start: 12/31/19 1330  Code Status: DNR Family Communication:            Subjective: Frustrated about her memory.  No fever,vomiting, pain.  Objective: Vitals:   01/02/20 1327 01/02/20 2106 01/03/20 0500 01/03/20 0745  BP: (!) 138/51 (!) 128/56  (!) 119/49  Pulse: 64 70  67  Resp:  14  17  Temp:  98.7 F (37.1 C)  98.9 F (37.2 C)  TempSrc:  Oral    SpO2: 94% 94%  93%  Weight:   56.9 kg   Height:        Intake/Output Summary (Last 24 hours) at 01/03/2020 0951 Last data filed at 01/03/2020 0616 Gross per 24 hour  Intake 120 ml  Output 1000 ml  Net -880 ml   Filed Weights   12/31/19 0500 01/02/20 0410  01/03/20 0500  Weight: 56.9 kg 57.2 kg 56.9 kg    Examination: General: Pt is alert, awake, not in acute distress, appears tired and chronically ill Cardiovascular: Irregular, normal rate, nl S1-S2, no murmurs appreciated.   No LE edema.   Respiratory: Normal respiratory rate and rhythm.  CTAB without rales or wheezes. Diminished throughout.   Abdominal: Abdomen soft, mild diffuse tenderness, no rebound or rigidity.  No distension or HSM.   Neuro/Psych: Strength symmetric in upper and lower extremities.  Judgment and insight appear mildly impaired.    Data Reviewed: I have personally reviewed following labs and imaging studies:  CBC: Recent Labs  Lab 12/27/19 1301 12/27/19 2009 12/28/19 0421 12/29/19 0207 12/30/19 0223 12/31/19 0101 01/01/20 0323  WBC 21.7*  --  25.3* 19.9* 13.8* 10.8* 9.0  NEUTROABS 20.7*  --  22.7* 16.9* 10.8*  --   --   HGB 11.8*   < > 8.6* 9.3* 9.1* 9.8* 9.3*  HCT 38.0   < > 28.9* 30.1* 28.7* 30.3* 29.8*  MCV 93.1  --  99.3 95.0 90.8 90.4 91.4  PLT 304  --  188 204 213 220 239   < > = values in this interval not displayed.   Basic Metabolic Panel: Recent Labs  Lab 12/28/19 0421 12/29/19 0207 12/30/19 0223 12/31/19 0101 01/01/20  0323  NA 137 140 139 141 142  K 4.2 3.7 3.5 3.1* 4.2  CL 106 105 107 109 112*  CO2 16* 22 21* 22 19*  GLUCOSE 72 74 104* 115* 111*  BUN 15 23 23 17 13   CREATININE 0.73 1.12* 1.05* 0.97 0.85  CALCIUM 7.3* 8.5* 8.4* 8.2* 8.4*  MG 0.9* 2.3 1.8  --   --    GFR: Estimated Creatinine Clearance: 39.5 mL/min (by C-G formula based on SCr of 0.85 mg/dL). Liver Function Tests: Recent Labs  Lab 12/27/19 1301 12/28/19 0421 12/30/19 0223 12/31/19 0101 01/01/20 0323  AST 111* 28 19 17 17   ALT 61* 23 19 17 16   ALKPHOS 112 48 68 75 67  BILITOT 1.2 0.5 1.1 0.5 0.7  PROT 6.2* 3.3* 5.1* 4.9* 4.7*  ALBUMIN 2.9* 1.4* 2.1* 2.2* 2.1*   Recent Labs  Lab 12/27/19 1301  LIPASE 20   No results for input(s): AMMONIA in the last  168 hours. Coagulation Profile: No results for input(s): INR, PROTIME in the last 168 hours. Cardiac Enzymes: No results for input(s): CKTOTAL, CKMB, CKMBINDEX, TROPONINI in the last 168 hours. BNP (last 3 results) No results for input(s): PROBNP in the last 8760 hours. HbA1C: No results for input(s): HGBA1C in the last 72 hours. CBG: Recent Labs  Lab 12/27/19 1255  GLUCAP 130*   Lipid Profile: No results for input(s): CHOL, HDL, LDLCALC, TRIG, CHOLHDL, LDLDIRECT in the last 72 hours. Thyroid Function Tests: No results for input(s): TSH, T4TOTAL, FREET4, T3FREE, THYROIDAB in the last 72 hours. Anemia Panel: No results for input(s): VITAMINB12, FOLATE, FERRITIN, TIBC, IRON, RETICCTPCT in the last 72 hours. Urine analysis:    Component Value Date/Time   COLORURINE AMBER (A) 12/27/2019 1720   APPEARANCEUR CLOUDY (A) 12/27/2019 1720   LABSPEC 1.015 12/27/2019 1720   PHURINE 8.0 12/27/2019 1720   GLUCOSEU NEGATIVE 12/27/2019 1720   HGBUR MODERATE (A) 12/27/2019 1720   BILIRUBINUR SMALL (A) 12/27/2019 1720   KETONESUR NEGATIVE 12/27/2019 1720   PROTEINUR 100 (A) 12/27/2019 1720   UROBILINOGEN 0.2 01/15/2014 2032   NITRITE NEGATIVE 12/27/2019 1720   LEUKOCYTESUR MODERATE (A) 12/27/2019 1720   Sepsis Labs: @LABRCNTIP (procalcitonin:4,lacticacidven:4)  ) Recent Results (from the past 240 hour(s))  Blood Culture (routine x 2)     Status: Abnormal   Collection Time: 12/27/19  1:01 PM   Specimen: BLOOD  Result Value Ref Range Status   Specimen Description BLOOD SITE NOT SPECIFIED  Final   Special Requests   Final    BOTTLES DRAWN AEROBIC AND ANAEROBIC Blood Culture adequate volume   Culture  Setup Time   Final    GRAM NEGATIVE RODS Organism ID to follow CRITICAL RESULT CALLED TO, READ BACK BY AND VERIFIED WITH: A. 02/26/2020 PharmD 15:00 12/28/19 ([email or web address]) Performed at [facility], [address] [place], 02/27/20 [facility]    Culture PROTEUS MIRABILIS (A)  Final   Report  Status 12/30/2019 FINAL  Final   Organism ID, Bacteria PROTEUS MIRABILIS  Final      Susceptibility   Proteus mirabilis - MIC*    AMPICILLIN <=2 SENSITIVE Sensitive     CEFAZOLIN <=4 SENSITIVE Sensitive     CEFEPIME <=0.12 SENSITIVE Sensitive     CEFTAZIDIME <=1 SENSITIVE Sensitive     CEFTRIAXONE <=0.25 SENSITIVE Sensitive     CIPROFLOXACIN >=4 RESISTANT Resistant     GENTAMICIN <=1 SENSITIVE Sensitive     IMIPENEM 2 SENSITIVE Sensitive     TRIMETH/SULFA >=320 RESISTANT  Resistant     AMPICILLIN/SULBACTAM <=2 SENSITIVE Sensitive     PIP/TAZO <=4 SENSITIVE Sensitive     * PROTEUS MIRABILIS  Blood Culture ID Panel (Reflexed)     Status: Abnormal   Collection Time: 12/27/19  1:01 PM  Result Value Ref Range Status   Enterococcus species NOT DETECTED NOT DETECTED Final   Listeria monocytogenes NOT DETECTED NOT DETECTED Final   Staphylococcus species NOT DETECTED NOT DETECTED Final   Staphylococcus aureus (BCID) NOT DETECTED NOT DETECTED Final   Streptococcus species NOT DETECTED NOT DETECTED Final   Streptococcus agalactiae NOT DETECTED NOT DETECTED Final   Streptococcus pneumoniae NOT DETECTED NOT DETECTED Final   Streptococcus pyogenes NOT DETECTED NOT DETECTED Final   Acinetobacter baumannii NOT DETECTED NOT DETECTED Final   Enterobacteriaceae species DETECTED (A) NOT DETECTED Final    Comment: Joretta BachelorA Wolfe PharmD 1500 12/28/19 ([email or web address]) Enterobacteriaceae represent a large family of gram-negative bacteria, not a single organism.    Enterobacter cloacae complex NOT DETECTED NOT DETECTED Final   Escherichia coli NOT DETECTED NOT DETECTED Final   Klebsiella oxytoca NOT DETECTED NOT DETECTED Final   Klebsiella pneumoniae NOT DETECTED NOT DETECTED Final   Proteus species DETECTED (A) NOT DETECTED Final    Comment: CRITICAL RESULT CALLED TO, READ BACK BY AND VERIFIED WITH: A. Artis FlockWolfe PharmD 15:00 12/28/19 ([email or web address])    Serratia marcescens NOT DETECTED NOT DETECTED Final   Haemophilus influenzae  NOT DETECTED NOT DETECTED Final   Neisseria meningitidis NOT DETECTED NOT DETECTED Final   Pseudomonas aeruginosa NOT DETECTED NOT DETECTED Final   Candida albicans NOT DETECTED NOT DETECTED Final   Candida glabrata NOT DETECTED NOT DETECTED Final   Candida krusei NOT DETECTED NOT DETECTED Final   Candida parapsilosis NOT DETECTED NOT DETECTED Final   Candida tropicalis NOT DETECTED NOT DETECTED Final    Comment: Performed at [facility], [address] [place], [place] [address]  SARS Coronavirus 2 by RT PCR (hospital order, performed in [facility] hospital lab) Nasopharyngeal Nasopharyngeal Swab     Status: None   Collection Time: 12/27/19  1:25 PM   Specimen: Nasopharyngeal Swab  Result Value Ref Range Status   SARS Coronavirus 2 NEGATIVE NEGATIVE Final    Comment: (NOTE) SARS-CoV-2 target nucleic acids are NOT DETECTED.  The SARS-CoV-2 RNA is generally detectable in upper and lower respiratory specimens during the acute phase of infection. The lowest concentration of SARS-CoV-2 viral copies this assay can detect is 250 copies / mL. A negative result does not preclude SARS-CoV-2 infection and should not be used as the sole basis for treatment or other patient management decisions.  A negative result may occur with improper specimen collection / handling, submission of specimen other than nasopharyngeal swab, presence of viral mutation(s) within the areas targeted by this assay, and inadequate number of viral copies (<250 copies / mL). A negative result must be combined with clinical observations, patient history, and epidemiological information.  Fact Sheet for Patients:   BoilerBrush.com.cyhttps://www.fda.gov/media/136312/download  Fact Sheet for Healthcare Providers: https://pope.com/https://www.fda.gov/media/136313/download  This test is not yet approved or  cleared by the [place] FDA and has been authorized for detection and/or diagnosis of SARS-CoV-2 by FDA under an Emergency Use  Authorization (EUA).  This EUA will remain in effect (meaning this test can be used) for the duration of the COVID-19 declaration under Section 564(b)(1) of the Act, 21 U.S.C. section 360bbb-3(b)(1), unless the authorization is terminated or revoked sooner.  Performed at [facility],  [address], [place], [place] [address]   Urine culture     Status: Abnormal   Collection Time: 12/27/19  5:31 PM   Specimen: Urine, Clean Catch  Result Value Ref Range Status   Specimen Description URINE, CLEAN CATCH  Final   Special Requests   Final    NONE Performed at [facility], [address], [place], [place] [address]    Culture MULTIPLE SPECIES PRESENT, SUGGEST RECOLLECTION (A)  Final   Report Status 12/28/2019 FINAL  Final  SARS Coronavirus 2 by RT PCR (hospital order, performed in [facility] hospital lab) Nasopharyngeal Nasopharyngeal Swab     Status: None   Collection Time: 01/02/20 11:45 AM   Specimen: Nasopharyngeal Swab  Result Value Ref Range Status   SARS Coronavirus 2 NEGATIVE NEGATIVE Final    Comment: (NOTE) SARS-CoV-2 target nucleic acids are NOT DETECTED.  The SARS-CoV-2 RNA is generally detectable in upper and lower respiratory specimens during the acute phase of infection. The lowest concentration of SARS-CoV-2 viral copies this assay can detect is 250 copies / mL. A negative result does not preclude SARS-CoV-2 infection and should not be used as the sole basis for treatment or other patient management decisions.  A negative result may occur with improper specimen collection / handling, submission of specimen other than nasopharyngeal swab, presence of viral mutation(s) within the areas targeted by this assay, and inadequate number of viral copies (<250 copies / mL). A negative result must be combined with clinical observations, patient history, and epidemiological information.  Fact Sheet for Patients:   BoilerBrush.com.cy  Fact  Sheet for Healthcare Providers: https://pope.com/  This test is not yet approved or  cleared by the [place] FDA and has been authorized for detection and/or diagnosis of SARS-CoV-2 by FDA under an Emergency Use Authorization (EUA).  This EUA will remain in effect (meaning this test can be used) for the duration of the COVID-19 declaration under Section 564(b)(1) of the Act, 21 U.S.C. section 360bbb-3(b)(1), unless the authorization is terminated or revoked sooner.  Performed at [facility], [address] [place], [place] [address]          Radiology Studies: No results found.      Scheduled Meds: . amiodarone  200 mg Oral BID   Followed by  . [START ON 01/13/2020] amiodarone  200 mg Oral Daily  . enoxaparin (LOVENOX) injection  40 mg Subcutaneous Q24H  . feeding supplement (ENSURE ENLIVE)  237 mL Oral TID BM  . multivitamin with minerals  1 tablet Oral Daily  . nebivolol  5 mg Oral Daily  . pantoprazole  40 mg Oral QHS  . sodium chloride flush  5 mL Intracatheter Q8H   Continuous Infusions: . ampicillin (OMNIPEN) IV 1 g (01/03/20 0525)     LOS: 7 days    Time spent: 25 minutes    Alberteen Sam, MD [facility] 01/03/2020, 9:51 AM     Please page though AMION or Epic secure chat:  For [employer], Higher education careers adviser

## 2020-01-04 DIAGNOSIS — G9349 Other encephalopathy: Secondary | ICD-10-CM | POA: Diagnosis not present

## 2020-01-04 DIAGNOSIS — I739 Peripheral vascular disease, unspecified: Secondary | ICD-10-CM | POA: Diagnosis not present

## 2020-01-04 DIAGNOSIS — R4182 Altered mental status, unspecified: Secondary | ICD-10-CM | POA: Diagnosis not present

## 2020-01-04 DIAGNOSIS — N390 Urinary tract infection, site not specified: Secondary | ICD-10-CM | POA: Diagnosis not present

## 2020-01-04 DIAGNOSIS — N1330 Unspecified hydronephrosis: Secondary | ICD-10-CM | POA: Diagnosis not present

## 2020-01-04 DIAGNOSIS — Z20822 Contact with and (suspected) exposure to covid-19: Secondary | ICD-10-CM | POA: Diagnosis not present

## 2020-01-04 DIAGNOSIS — F419 Anxiety disorder, unspecified: Secondary | ICD-10-CM | POA: Diagnosis not present

## 2020-01-04 DIAGNOSIS — I2510 Atherosclerotic heart disease of native coronary artery without angina pectoris: Secondary | ICD-10-CM | POA: Diagnosis not present

## 2020-01-04 DIAGNOSIS — I480 Paroxysmal atrial fibrillation: Secondary | ICD-10-CM | POA: Diagnosis not present

## 2020-01-04 DIAGNOSIS — I5032 Chronic diastolic (congestive) heart failure: Secondary | ICD-10-CM | POA: Diagnosis not present

## 2020-01-05 DIAGNOSIS — F331 Major depressive disorder, recurrent, moderate: Secondary | ICD-10-CM | POA: Diagnosis not present

## 2020-01-05 DIAGNOSIS — F0150 Vascular dementia without behavioral disturbance: Secondary | ICD-10-CM | POA: Diagnosis not present

## 2020-01-06 DIAGNOSIS — M25559 Pain in unspecified hip: Secondary | ICD-10-CM | POA: Diagnosis not present

## 2020-01-06 DIAGNOSIS — I2510 Atherosclerotic heart disease of native coronary artery without angina pectoris: Secondary | ICD-10-CM | POA: Diagnosis not present

## 2020-01-06 DIAGNOSIS — S72009A Fracture of unspecified part of neck of unspecified femur, initial encounter for closed fracture: Secondary | ICD-10-CM | POA: Diagnosis not present

## 2020-01-06 DIAGNOSIS — I509 Heart failure, unspecified: Secondary | ICD-10-CM | POA: Diagnosis not present

## 2020-01-07 DIAGNOSIS — Z20822 Contact with and (suspected) exposure to covid-19: Secondary | ICD-10-CM | POA: Diagnosis not present

## 2020-01-07 DIAGNOSIS — F331 Major depressive disorder, recurrent, moderate: Secondary | ICD-10-CM | POA: Diagnosis not present

## 2020-01-14 DIAGNOSIS — N390 Urinary tract infection, site not specified: Secondary | ICD-10-CM | POA: Diagnosis not present

## 2020-01-14 DIAGNOSIS — I480 Paroxysmal atrial fibrillation: Secondary | ICD-10-CM | POA: Diagnosis not present

## 2020-01-14 DIAGNOSIS — I2510 Atherosclerotic heart disease of native coronary artery without angina pectoris: Secondary | ICD-10-CM | POA: Diagnosis not present

## 2020-01-14 DIAGNOSIS — Z792 Long term (current) use of antibiotics: Secondary | ICD-10-CM | POA: Diagnosis not present

## 2020-01-14 DIAGNOSIS — I5032 Chronic diastolic (congestive) heart failure: Secondary | ICD-10-CM | POA: Diagnosis not present

## 2020-01-14 DIAGNOSIS — F419 Anxiety disorder, unspecified: Secondary | ICD-10-CM | POA: Diagnosis not present

## 2020-01-14 DIAGNOSIS — I739 Peripheral vascular disease, unspecified: Secondary | ICD-10-CM | POA: Diagnosis not present

## 2020-01-19 DIAGNOSIS — Z20828 Contact with and (suspected) exposure to other viral communicable diseases: Secondary | ICD-10-CM | POA: Diagnosis not present

## 2020-01-21 DIAGNOSIS — F331 Major depressive disorder, recurrent, moderate: Secondary | ICD-10-CM | POA: Diagnosis not present

## 2020-01-27 NOTE — Progress Notes (Deleted)
Cardiology Office Note    Date:  01/27/2020   ID:  [name], DOB 1928-10-14, MRN [id number]  PCP:  Donita Brooks, MD  Cardiologist: Kristeen Miss, MD EPS: None  No chief complaint on file.   History of Present Illness:  [name] is a 86 y.o. female with history CAD s/p CABG 1997 with redo in 2010, paroxysmal atrial fibrillation, ICM with improvement in EF, HTN, HLD, Carotid artery disease s/p right CEA 1995, Asthma, CVA 2009, and frequent falls  Patient was hospitalized with sepsis likely secondary to UTI and went into A. fib with RVR 01/25/2020 treated with IV amiodarone and converted to normal sinus rhythm.  Not anticoagulated due to frequent falls.  Plan short course of amiodarone while recovering from acute illness will not plan to use it long-term.  200 mg BID, then 200 mg daily x 2 weeks, then discontinue.  2D echo normal LVEF 65 to 70% with mild asymmetric LVH trivial MR, aortic valve is abnormal but could not be completely assessed.     Past Medical History:  Diagnosis Date  . Allergy   . Asthma   . CAD (coronary artery disease)    s/p CABG x 5 in 1997 & CABG x 3 in 2010; Last cath in April of 2010 showed grafts to be patent. EF is 40 to 45%  . CHF (congestive heart failure) (HCC)    EF is 40 to 45%, 06/2015 EF 55-60%  . Depression    following bypass surgery  . Diverticulosis   . Endometriosis   . GERD (gastroesophageal reflux disease)   . Hepatitis A   . Hiatal hernia   . History of cerebrovascular disease   . History of vertebral fracture    at T8  . Hyperlipemia   . Hypertension   . LBP (low back pain)   . Mixed incontinence urge and stress 04/22/2013  . Nocturia 04/22/2013  . Pleural effusion    post surgical  . PVD (peripheral vascular disease) (HCC)    S/P right CEA  . Sleep apnea   . Stroke [facility]) 2010    Past Surgical History:  Procedure Laterality Date  . APPENDECTOMY    . CARDIAC CATHETERIZATION  April 2010   Grafts were patent. EF is  40 to 45%  . CAROTID ENDARTERECTOMY     right  . CORONARY ARTERY BYPASS GRAFT  1443,1540   Original surgery in 1997 x 5; Redo CABG x 3 in 2010 including free RIMA to distal LAD, SVG to OM and SVG to left posterolateral  . INTRAMEDULLARY (IM) NAIL INTERTROCHANTERIC Right 11/19/2018   Procedure: INTRAMEDULLARY (IM) NAIL INTERTROCHANTRIC;  Surgeon: Yolonda Kida, MD;  Location: [facility] OR;  Service: Orthopedics;  Laterality: Right;  . IR NEPHROSTOMY PLACEMENT RIGHT  12/27/2019  . KNEE SURGERY     left  . LEFT HEART CATHETERIZATION WITH CORONARY/GRAFT ANGIOGRAM N/A 03/12/2013   Procedure: LEFT HEART CATHETERIZATION WITH Isabel Caprice;  Surgeon: Peter M Swaziland, MD;  Location: [facility] CATH LAB;  Service: Cardiovascular;  Laterality: N/A;  . PARTIAL COLECTOMY     due to colon abcess    Current Medications: No outpatient medications have been marked as taking for the 02/02/20 encounter (Appointment) with Dyann Kief, PA-C.     Allergies:   Metoprolol, Phenergan [promethazine hcl], Prochlorperazine edisylate, Zetia [ezetimibe], Compazine [prochlorperazine], Sulfur, Niacin, and Sulfonamide derivatives   Social History   Socioeconomic History  . Marital status: Widowed    Spouse name:  Not on file  . Number of children: 0  . Years of education: Not on file  . Highest education level: Not on file  Occupational History  . Occupation: retired Magazine features editor: RETIRED  Tobacco Use  . Smoking status: Never Smoker  . Smokeless tobacco: Never Used  Vaping Use  . Vaping Use: Never used  Substance and Sexual Activity  . Alcohol use: No    Alcohol/week: 0.0 standard drinks  . Drug use: No  . Sexual activity: Never  Other Topics Concern  . Not on file  Social History Narrative  . Not on file   Social Determinants of Health   Financial Resource Strain:   . Difficulty of Paying Living Expenses: Not on file  Food Insecurity:   . Worried About Programme researcher, broadcasting/film/video in the Last  Year: Not on file  . Ran Out of Food in the Last Year: Not on file  Transportation Needs:   . Lack of Transportation (Medical): Not on file  . Lack of Transportation (Non-Medical): Not on file  Physical Activity:   . Days of Exercise per Week: Not on file  . Minutes of Exercise per Session: Not on file  Stress:   . Feeling of Stress : Not on file  Social Connections:   . Frequency of Communication with Friends and Family: Not on file  . Frequency of Social Gatherings with Friends and Family: Not on file  . Attends Religious Services: Not on file  . Active Member of Clubs or Organizations: Not on file  . Attends Banker Meetings: Not on file  . Marital Status: Not on file     Family History:  The patient's ***family history includes Allergies in her father; Dementia in her brother; Diabetes in her maternal grandmother, mother, paternal grandmother, and sister; Healthy in her sister; Heart attack in her father and mother; Heart disease in her mother and sister; Other in her brother, brother, and brother; Rheum arthritis in her mother; Stroke in her sister.   ROS:   Please see the history of present illness.    ROS All other systems reviewed and are negative.   PHYSICAL EXAM:   VS:  There were no vitals taken for this visit.  Physical Exam  GEN: Well nourished, well developed, in no acute distress  HEENT: normal  Neck: no JVD, carotid bruits, or masses Cardiac:RRR; no murmurs, rubs, or gallops  Respiratory:  clear to auscultation bilaterally, normal work of breathing GI: soft, nontender, nondistended, + BS Ext: without cyanosis, clubbing, or edema, Good distal pulses bilaterally MS: no deformity or atrophy  Skin: warm and dry, no rash Neuro:  Alert and Oriented x 3, Strength and sensation are intact Psych: euthymic mood, full affect  Wt Readings from Last 3 Encounters:  01/03/20 125 lb 7.1 oz (56.9 kg)  11/21/18 132 lb 4.4 oz (60 kg)  02/03/18 134 lb (60.8 kg)       Studies/Labs Reviewed:   EKG:  EKG is*** ordered today.  The ekg ordered today demonstrates ***  Recent Labs: 12/30/2019: Magnesium 1.8 01/01/2020: ALT 16; BUN 13; Creatinine, Ser 0.85; Hemoglobin 9.3; Platelets 239; Potassium 4.2; Sodium 142   Lipid Panel    Component Value Date/Time   CHOL 135 10/19/2016 1448   TRIG 71 12/27/2019 1301   HDL 54 10/19/2016 1448   CHOLHDL 2.5 10/19/2016 1448   CHOLHDL 3 03/25/2014 1039   VLDL 19.8 03/25/2014 1039   LDLCALC 68 10/19/2016 1448  Additional studies/ records that were reviewed today include:  TTE 12/28/19:   1. No apical images obtained to evaluate diastolic function. This study  is limited for diagnostic assessment.   2. Left ventricular ejection fraction, by estimation, is 65 to 70%. The  left ventricle has normal function. Left ventricular endocardial border  not optimally defined to evaluate regional wall motion. There is mild  asymmetric left ventricular hypertrophy   of the septal segment. Left ventricular diastolic function could not be  evaluated.   3. Right ventricular systolic function is normal. The right ventricular  size is normal. There is mildly elevated pulmonary artery systolic  pressure. The estimated right ventricular systolic pressure is 43.1 mmHg.   4. The mitral valve is degenerative. Trivial mitral valve regurgitation.   5. The tricuspid valve is degenerative. Tricuspid valve regurgitation is  mild to moderate.   6. The aortic valve is abnormal. AV is incompletely assessed.        ASSESSMENT:    1. Coronary artery disease involving coronary bypass graft of native heart without angina pectoris   2. Paroxysmal atrial fibrillation (HCC)   3. Ischemic cardiomyopathy   4. Essential hypertension   5. Hyperlipidemia, unspecified hyperlipidemia type   6. Cerebral vascular disease      PLAN:  In order of problems listed above:    Coronary artery disease:   Status post CABG 1997 with redo in  2010  PAF with recent A. fib with RVR in the setting of sepsis and UTI.  She was given a short treatment of amiodarone but no plans for long-term amiodarone.  Not on anticoagulation because of fall risk.  Ischemic cardiomyopathy with improvement in LV function congestive heart failure - her last echo in June shows a normal EF .   Will get a BNP today and BMP today   Essential hypertension patient was hypotensive in the setting of sepsis.   Hyperlipidemia -  Will check labs again in 6 months.   Peripheral vascular disease status post right carotid endarterectomy.        Medication Adjustments/Labs and Tests Ordered: Current medicines are reviewed at length with the patient today.  Concerns regarding medicines are outlined above.  Medication changes, Labs and Tests ordered today are listed in the Patient Instructions below. There are no Patient Instructions on file for this visit.   Elson Clan, PA-C  01/27/2020 3:00 PM    [facility] [address] [place], [place], [place]  [address] Phone: [phone]; Fax: [phone]

## 2020-02-02 ENCOUNTER — Ambulatory Visit: Payer: Medicare PPO | Admitting: Physician Assistant

## 2020-02-04 DIAGNOSIS — F331 Major depressive disorder, recurrent, moderate: Secondary | ICD-10-CM | POA: Diagnosis not present

## 2020-02-08 DIAGNOSIS — Z20828 Contact with and (suspected) exposure to other viral communicable diseases: Secondary | ICD-10-CM | POA: Diagnosis not present

## 2020-02-13 DIAGNOSIS — F331 Major depressive disorder, recurrent, moderate: Secondary | ICD-10-CM | POA: Diagnosis not present

## 2020-02-15 DIAGNOSIS — Z20828 Contact with and (suspected) exposure to other viral communicable diseases: Secondary | ICD-10-CM | POA: Diagnosis not present

## 2020-02-16 DIAGNOSIS — N1330 Unspecified hydronephrosis: Secondary | ICD-10-CM | POA: Diagnosis not present

## 2020-02-16 DIAGNOSIS — I480 Paroxysmal atrial fibrillation: Secondary | ICD-10-CM | POA: Diagnosis not present

## 2020-02-16 DIAGNOSIS — F419 Anxiety disorder, unspecified: Secondary | ICD-10-CM | POA: Diagnosis not present

## 2020-02-16 DIAGNOSIS — I739 Peripheral vascular disease, unspecified: Secondary | ICD-10-CM | POA: Diagnosis not present

## 2020-02-16 DIAGNOSIS — I2510 Atherosclerotic heart disease of native coronary artery without angina pectoris: Secondary | ICD-10-CM | POA: Diagnosis not present

## 2020-02-16 DIAGNOSIS — Z792 Long term (current) use of antibiotics: Secondary | ICD-10-CM | POA: Diagnosis not present

## 2020-02-16 DIAGNOSIS — I5032 Chronic diastolic (congestive) heart failure: Secondary | ICD-10-CM | POA: Diagnosis not present

## 2020-02-20 DIAGNOSIS — F331 Major depressive disorder, recurrent, moderate: Secondary | ICD-10-CM | POA: Diagnosis not present

## 2020-03-29 ENCOUNTER — Encounter (HOSPITAL_COMMUNITY): Payer: Self-pay

## 2020-03-29 ENCOUNTER — Emergency Department (HOSPITAL_COMMUNITY)
Admission: EM | Admit: 2020-03-29 | Discharge: 2020-03-30 | Disposition: A | Discharge status: Home / Self Care | Attending: Emergency Medicine | Admitting: Emergency Medicine

## 2020-03-29 ENCOUNTER — Emergency Department (HOSPITAL_COMMUNITY)

## 2020-03-29 DIAGNOSIS — I25119 Atherosclerotic heart disease of native coronary artery with unspecified angina pectoris: Secondary | ICD-10-CM | POA: Diagnosis not present

## 2020-03-29 DIAGNOSIS — I110 Hypertensive heart disease with heart failure: Secondary | ICD-10-CM | POA: Insufficient documentation

## 2020-03-29 DIAGNOSIS — T83022A Displacement of nephrostomy catheter, initial encounter: Secondary | ICD-10-CM | POA: Diagnosis not present

## 2020-03-29 DIAGNOSIS — Z79899 Other long term (current) drug therapy: Secondary | ICD-10-CM | POA: Diagnosis not present

## 2020-03-29 DIAGNOSIS — J45909 Unspecified asthma, uncomplicated: Secondary | ICD-10-CM | POA: Diagnosis not present

## 2020-03-29 DIAGNOSIS — W050XXA Fall from non-moving wheelchair, initial encounter: Secondary | ICD-10-CM | POA: Diagnosis not present

## 2020-03-29 DIAGNOSIS — I5032 Chronic diastolic (congestive) heart failure: Secondary | ICD-10-CM | POA: Diagnosis not present

## 2020-03-29 DIAGNOSIS — F0390 Unspecified dementia without behavioral disturbance: Secondary | ICD-10-CM | POA: Insufficient documentation

## 2020-03-29 DIAGNOSIS — Z951 Presence of aortocoronary bypass graft: Secondary | ICD-10-CM | POA: Diagnosis not present

## 2020-03-29 NOTE — ED Provider Notes (Signed)
[facility]-EMERGENCY DEPT Provider Note   CSN: [id number] Arrival date & time: 03/29/20  2220     History No chief complaint on file.   [name] is a 86 y.o. female.  86 year old female with history of mild dementia, on hospice who presents after unwitnessed fall from nursing home with a dislodgment of her right-sided nephrostomy tube.  Per EMS, patient was likely attempting to go from the bed to the wheelchair and was found on the ground.  She was at her neurological baseline.  This was according to nursing home staff.  Nephrostomy tube was noted to have been dislodged.  Presents for further management        Past Medical History:  Diagnosis Date  . Allergy   . Asthma   . CAD (coronary artery disease)    s/p CABG x 5 in 1997 & CABG x 3 in 2010; Last cath in April of 2010 showed grafts to be patent. EF is 40 to 45%  . CHF (congestive heart failure) (HCC)    EF is 40 to 45%, 06/2015 EF 55-60%  . Depression    following bypass surgery  . Diverticulosis   . Endometriosis   . GERD (gastroesophageal reflux disease)   . Hepatitis A   . Hiatal hernia   . History of cerebrovascular disease   . History of vertebral fracture    at T8  . Hyperlipemia   . Hypertension   . LBP (low back pain)   . Mixed incontinence urge and stress 04/22/2013  . Nocturia 04/22/2013  . Pleural effusion    post surgical  . PVD (peripheral vascular disease) (HCC)    S/P right CEA  . Sleep apnea   . Stroke [facility]) 2010    Patient Active Problem List   Diagnosis Date Noted  . Palliative care by specialist   . Goals of care, counseling/discussion   . DNR (do not resuscitate)   . Severe sepsis with acute organ dysfunction due to Gram negative bacteria (HCC) 12/27/2019  . Acute metabolic encephalopathy 12/27/2019  . Lactic acidosis 12/27/2019  . Elevated troponin level not due myocardial infarction 12/27/2019  . Mixed hyperlipidemia 12/27/2019  . Obstruction of right  ureteropelvic junction (UPJ) due to stone 12/27/2019  . Hip fracture (HCC) 11/18/2018  . Closed displaced intertrochanteric fracture of right femur (HCC)   . Fall at home, initial encounter 09/11/2018  . Generalized weakness 09/11/2018  . UTI (urinary tract infection) 09/10/2018  . Tachycardia   . PAF (paroxysmal atrial fibrillation) (HCC)   . Malnutrition of moderate degree 09/06/2017  . Multiple rib fractures 09/04/2017  . Dyspnea 01/21/2014  . Chronic diastolic CHF (congestive heart failure) (HCC) 12/15/2013  . Cerebral vascular disease 10/13/2013  . Mixed incontinence urge and stress 04/22/2013  . Nocturia 04/22/2013  . Angina decubitus (HCC) 03/12/2013  . GERD without esophagitis 08/16/2011  . Other general symptoms  08/16/2011  . Fatigue 09/04/2010  . HEPATITIS, ACUTE 07/12/2009  . AMI 07/12/2009  . ESOPHAGEAL REFLUX 07/12/2009  . DIVERTICULOSIS, COLON 07/12/2009  . CHEST PAIN 07/12/2009  . SLEEP APNEA 10/23/2007  . Essential hypertension 09/18/2007  . CAD (coronary artery disease) 09/18/2007  . CHEST PAIN, ATYPICAL 09/18/2007    Past Surgical History:  Procedure Laterality Date  . APPENDECTOMY    . CARDIAC CATHETERIZATION  April 2010   Grafts were patent. EF is 40 to 45%  . CAROTID ENDARTERECTOMY     right  . CORONARY ARTERY BYPASS GRAFT  9211,9417   Original surgery in 1997 x 5; Redo CABG x 3 in 2010 including free RIMA to distal LAD, SVG to OM and SVG to left posterolateral  . INTRAMEDULLARY (IM) NAIL INTERTROCHANTERIC Right 11/19/2018   Procedure: INTRAMEDULLARY (IM) NAIL INTERTROCHANTRIC;  Surgeon: Yolonda Kida, MD;  Location: [facility] OR;  Service: Orthopedics;  Laterality: Right;  . IR NEPHROSTOMY PLACEMENT RIGHT  12/27/2019  . KNEE SURGERY     left  . LEFT HEART CATHETERIZATION WITH CORONARY/GRAFT ANGIOGRAM N/A 03/12/2013   Procedure: LEFT HEART CATHETERIZATION WITH Isabel Caprice;  Surgeon: Peter M Swaziland, MD;  Location: [facility] CATH LAB;  Service:  Cardiovascular;  Laterality: N/A;  . PARTIAL COLECTOMY     due to colon abcess     OB History   No obstetric history on file.     Family History  Problem Relation Age of Onset  . Heart attack Mother   . Diabetes Mother   . Heart disease Mother   . Rheum arthritis Mother   . Heart attack Father   . Allergies Father   . Diabetes Sister   . Stroke Sister   . Heart disease Sister   . Dementia Brother   . Healthy Sister   . Other Brother        LUNG ISSUES  . Other Brother        LUNG ISSUES  . Other Brother        LUNG ISSUES  . Diabetes Maternal Grandmother   . Diabetes Paternal Grandmother   . Colon cancer Neg Hx     Social History   Tobacco Use  . Smoking status: Never Smoker  . Smokeless tobacco: Never Used  Vaping Use  . Vaping Use: Never used  Substance Use Topics  . Alcohol use: No    Alcohol/week: 0.0 standard drinks  . Drug use: No    Home Medications Prior to Admission medications   Medication Sig Start Date End Date Taking? Authorizing Provider  acetaminophen (TYLENOL) 325 MG tablet Take 2 tablets (650 mg total) by mouth every 6 (six) hours as needed for mild pain, fever or headache (or Fever >/= 101). Patient not taking: Reported on 12/27/2019 09/11/18   Shon Hale, MD  amiodarone (PACERONE) 200 MG tablet Take 1 tablet (200 mg total) by mouth 2 (two) times daily. 01/02/20   Danford, Earl Lites, MD  amiodarone (PACERONE) 200 MG tablet Take 1 tablet (200 mg total) by mouth daily. 01/13/20   Danford, Earl Lites, MD  aspirin EC 81 MG tablet Take 1 tablet (81 mg total) by mouth daily with breakfast. 09/11/18   Shon Hale, MD  Calcium Carbonate-Vitamin D (OYSTER SHELL CALCIUM 500 + D PO) Take 1 tablet by mouth in the morning and at bedtime.    [provider]  cephALEXin (KEFLEX) 250 MG capsule Take 1 capsule (250 mg total) by mouth daily. 01/04/20 01/03/21  Danford, Earl Lites, MD  Cholecalciferol (VITAMIN D3) 1.25 MG (50000 UT) TABS Take  1 tablet by mouth daily.    [provider]  docusate sodium (COLACE) 100 MG capsule Take 1 capsule (100 mg total) by mouth 2 (two) times daily. 11/21/18   Swayze, Ava, DO  escitalopram (LEXAPRO) 10 MG tablet Take 15 mg by mouth daily.    [provider]  esomeprazole (NEXIUM) 20 MG capsule Take 20 mg by mouth daily at 12 noon.     [provider]  gabapentin (NEURONTIN) 100 MG capsule Take 100 mg by mouth  2 (two) times daily.    [provider]  guaiFENesin (MUCINEX) 600 MG 12 hr tablet Take 600 mg by mouth 2 (two) times daily.    [provider]  lidocaine (LIDODERM) 5 % Place 1 patch onto the skin 2 (two) times daily. Remove & Discard patch within 12 hours or as directed by MD    [provider]  melatonin 5 MG TABS Take 5 mg by mouth at bedtime.    [provider]  nebivolol (BYSTOLIC) 5 MG tablet Take 1 tablet (5 mg total) by mouth daily. 01/03/20   Danford, Earl Liteshristopher P, MD  oxyCODONE (OXY IR/ROXICODONE) 5 MG immediate release tablet Take 2 tablets (10 mg total) by mouth every 4 (four) hours as needed for severe pain. 01/02/20   Danford, Earl Liteshristopher P, MD  potassium chloride (MICRO-K) 10 MEQ CR capsule Take 1 capsule (10 mEq total) by mouth daily. 09/11/18   Shon HaleEmokpae, Courage, MD  rosuvastatin (CRESTOR) 20 MG tablet Take 1 tablet (20 mg total) by mouth daily. Pt needs to keep appt in Oct for further refills 02/09/19   Nahser, Deloris PingPhilip J, MD    Allergies    Metoprolol, Phenergan [promethazine hcl], Prochlorperazine edisylate, Zetia [ezetimibe], Compazine [prochlorperazine], Sulfur, Niacin, and Sulfonamide derivatives  Review of Systems   Review of Systems  Unable to perform ROS: Dementia    Physical Exam Updated Vital Signs There were no vitals taken for this visit.  Physical Exam Vitals and nursing note reviewed.  Constitutional:      General: She is not in acute distress.    Appearance: Normal appearance. She is  well-developed. She is not toxic-appearing.  HENT:     Head: Normocephalic and atraumatic.  Eyes:     General: Lids are normal.     Conjunctiva/sclera: Conjunctivae normal.     Pupils: Pupils are equal, round, and reactive to light.  Neck:     Thyroid: No thyroid mass.     Trachea: No tracheal deviation.  Cardiovascular:     Rate and Rhythm: Normal rate and regular rhythm.     Heart sounds: Normal heart sounds. No murmur heard.  No gallop.   Pulmonary:     Effort: Pulmonary effort is normal. No respiratory distress.     Breath sounds: Normal breath sounds. No stridor. No decreased breath sounds, wheezing, rhonchi or rales.  Abdominal:     General: Bowel sounds are normal. There is no distension.     Palpations: Abdomen is soft.     Tenderness: There is no abdominal tenderness. There is no rebound.  Musculoskeletal:        General: No tenderness. Normal range of motion.     Cervical back: Normal range of motion and neck supple.       Back:  Skin:    General: Skin is warm and dry.     Findings: No abrasion or rash.  Neurological:     Mental Status: Mental status is at baseline. She is lethargic and disoriented.     GCS: GCS eye subscore is 4. GCS verbal subscore is 5. GCS motor subscore is 6.     Comments: Patient withdraws to pain in all 4 extremities  Psychiatric:        Attention and Perception: She is inattentive.        Speech: Speech is delayed.     ED Results / Procedures / Treatments   Labs (all labs ordered are listed, but only abnormal results are displayed) Labs Reviewed -  No data to display  EKG None  Radiology No results found.  Procedures Procedures (including critical care time)  Medications Ordered in ED Medications - No data to display  ED Course  I have reviewed the triage vital signs and the nursing notes.  Pertinent labs & imaging results that were available during my care of the patient were reviewed by me and considered in my medical  decision making (see chart for details).    MDM Rules/Calculators/A&P                          Patient to have CT of head and C-spine.  Will have to have nephrostomy tube placed in the morning and will likely have to stay overnight in the ED.  Will sign to next provider   Final Clinical Impression(s) / ED Diagnoses Final diagnoses:  None    Rx / DC Orders ED Discharge Orders    None       Lorre Nick, MD 03/29/20 2246

## 2020-03-30 ENCOUNTER — Emergency Department (HOSPITAL_COMMUNITY)

## 2020-03-30 HISTORY — PX: IR NEPHROSTOMY PLACEMENT RIGHT: IMG6064

## 2020-03-30 LAB — BASIC METABOLIC PANEL
Anion gap: 9 (ref 5–15)
BUN: 25 mg/dL — ABNORMAL HIGH (ref 8–23)
CO2: 27 mmol/L (ref 22–32)
Calcium: 9.3 mg/dL (ref 8.9–10.3)
Chloride: 100 mmol/L (ref 98–111)
Creatinine, Ser: 1.1 mg/dL — ABNORMAL HIGH (ref 0.44–1.00)
GFR, Estimated: 48 mL/min — ABNORMAL LOW (ref >60)
Glucose, Bld: 119 mg/dL — ABNORMAL HIGH (ref 70–99)
Potassium: 4 mmol/L (ref 3.5–5.1)
Sodium: 136 mmol/L (ref 135–145)

## 2020-03-30 LAB — CBC
HCT: 34.3 % — ABNORMAL LOW (ref 36.0–46.0)
Hemoglobin: 11 g/dL — ABNORMAL LOW (ref 12.0–15.0)
MCH: 29.3 pg (ref 26.0–34.0)
MCHC: 32.1 g/dL (ref 30.0–36.0)
MCV: 91.5 fL (ref 80.0–100.0)
Platelets: 288 10*3/uL (ref 150–400)
RBC: 3.75 MIL/uL — ABNORMAL LOW (ref 3.87–5.11)
RDW: 14 % (ref 11.5–15.5)
WBC: 12.1 10*3/uL — ABNORMAL HIGH (ref 4.0–10.5)
nRBC: 0 % (ref 0.0–0.2)

## 2020-03-30 LAB — PROTIME-INR
INR: 1.1 (ref 0.8–1.2)
Prothrombin Time: 13.9 seconds (ref 11.4–15.2)

## 2020-03-30 MED ORDER — POTASSIUM CHLORIDE CRYS ER 10 MEQ PO TBCR
10.0000 meq | EXTENDED_RELEASE_TABLET | Freq: Every day | ORAL | Status: DC
  Administered 2020-03-30: 10 meq via ORAL
  Filled 2020-03-30: qty 1

## 2020-03-30 MED ORDER — SODIUM CHLORIDE 0.9 % IV BOLUS
500.0000 mL | Freq: Once | INTRAVENOUS | Status: AC
  Administered 2020-03-30: 500 mL via INTRAVENOUS

## 2020-03-30 MED ORDER — CARVEDILOL PHOSPHATE ER 20 MG PO CP24
20.0000 mg | ORAL_CAPSULE | Freq: Every day | ORAL | Status: DC
  Administered 2020-03-30: 20 mg via ORAL
  Filled 2020-03-30: qty 1

## 2020-03-30 MED ORDER — IOHEXOL 300 MG/ML  SOLN
50.0000 mL | Freq: Once | INTRAMUSCULAR | Status: AC | PRN
  Administered 2020-03-30: 15 mL

## 2020-03-30 MED ORDER — GUAIFENESIN ER 600 MG PO TB12
600.0000 mg | ORAL_TABLET | Freq: Two times a day (BID) | ORAL | Status: DC
  Administered 2020-03-30: 600 mg via ORAL
  Filled 2020-03-30: qty 1

## 2020-03-30 MED ORDER — CEFAZOLIN SODIUM-DEXTROSE 2-4 GM/100ML-% IV SOLN
INTRAVENOUS | Status: AC
  Administered 2020-03-30: 2 g via INTRAVENOUS
  Filled 2020-03-30: qty 100

## 2020-03-30 MED ORDER — CEPHALEXIN 250 MG PO CAPS
250.0000 mg | ORAL_CAPSULE | Freq: Every day | ORAL | Status: DC
  Administered 2020-03-30: 250 mg via ORAL
  Filled 2020-03-30: qty 1

## 2020-03-30 MED ORDER — MIDAZOLAM HCL 2 MG/2ML IJ SOLN
INTRAMUSCULAR | Status: DC | PRN
  Administered 2020-03-30: 0.5 mg via INTRAVENOUS

## 2020-03-30 MED ORDER — LIDOCAINE-EPINEPHRINE 1 %-1:100000 IJ SOLN
INTRAMUSCULAR | Status: DC | PRN
  Administered 2020-03-30: 10 mL

## 2020-03-30 MED ORDER — ACETAMINOPHEN 325 MG PO TABS: 650.0000 mg | ORAL_TABLET | Freq: Four times a day (QID) | ORAL | Status: DC | PRN

## 2020-03-30 MED ORDER — PANTOPRAZOLE SODIUM 40 MG PO TBEC
40.0000 mg | DELAYED_RELEASE_TABLET | Freq: Every day | ORAL | Status: DC
  Administered 2020-03-30: 40 mg via ORAL
  Filled 2020-03-30: qty 1

## 2020-03-30 MED ORDER — ASPIRIN EC 81 MG PO TBEC: 81.0000 mg | DELAYED_RELEASE_TABLET | Freq: Every day | ORAL | Status: DC

## 2020-03-30 MED ORDER — MELATONIN 5 MG PO TABS: 5.0000 mg | ORAL_TABLET | Freq: Every day | ORAL | Status: DC

## 2020-03-30 MED ORDER — DOCUSATE SODIUM 100 MG PO CAPS
100.0000 mg | ORAL_CAPSULE | Freq: Two times a day (BID) | ORAL | Status: DC
  Administered 2020-03-30: 100 mg via ORAL
  Filled 2020-03-30: qty 1

## 2020-03-30 MED ORDER — MIDAZOLAM HCL 2 MG/2ML IJ SOLN
INTRAMUSCULAR | Status: AC
  Filled 2020-03-30: qty 2

## 2020-03-30 MED ORDER — LIDOCAINE 4 % EX PTCH: 1.0000 | MEDICATED_PATCH | Freq: Two times a day (BID) | CUTANEOUS | Status: DC

## 2020-03-30 MED ORDER — OXYCODONE HCL 5 MG PO TABS: 10.0000 mg | ORAL_TABLET | ORAL | Status: DC | PRN

## 2020-03-30 MED ORDER — GABAPENTIN 100 MG PO CAPS
100.0000 mg | ORAL_CAPSULE | Freq: Two times a day (BID) | ORAL | Status: DC
  Administered 2020-03-30: 100 mg via ORAL
  Filled 2020-03-30: qty 1

## 2020-03-30 MED ORDER — ESCITALOPRAM OXALATE 10 MG PO TABS
15.0000 mg | ORAL_TABLET | Freq: Every day | ORAL | Status: DC
  Administered 2020-03-30: 15 mg via ORAL
  Filled 2020-03-30: qty 2

## 2020-03-30 MED ORDER — LIDOCAINE HCL 1 % IJ SOLN
INTRAMUSCULAR | Status: AC
  Filled 2020-03-30: qty 20

## 2020-03-30 MED ORDER — LIDOCAINE 5 % EX PTCH
1.0000 | MEDICATED_PATCH | CUTANEOUS | Status: DC
  Filled 2020-03-30: qty 1

## 2020-03-30 MED ORDER — CEFAZOLIN SODIUM-DEXTROSE 2-4 GM/100ML-% IV SOLN: 2.0000 g | INTRAVENOUS | Status: AC

## 2020-03-30 MED ORDER — FENTANYL CITRATE (PF) 100 MCG/2ML IJ SOLN
INTRAMUSCULAR | Status: AC
  Filled 2020-03-30: qty 2

## 2020-03-30 MED ORDER — AMIODARONE HCL 200 MG PO TABS
200.0000 mg | ORAL_TABLET | Freq: Every day | ORAL | Status: DC
  Administered 2020-03-30: 200 mg via ORAL
  Filled 2020-03-30: qty 1

## 2020-03-30 NOTE — ED Notes (Signed)
Pt has c collar in place and pt right side nephrostomy tube came out

## 2020-03-30 NOTE — ED Notes (Signed)
Patient back from IR.

## 2020-03-30 NOTE — Progress Notes (Addendum)
[facility] Addendum:  Pt already has O2 set up and available at [facility].   Received order from Dr. Criss Alvine for supplemental oxygen for use at [facility].  Order placed as STAT order for delivery today.  Please call [facility] to verify delivery of oxygen before discharge.  Thank you for the opportunity to participate in this pt's care.  Gillian Scarce, BSN, RN [employer] [phone] (24h on call)

## 2020-03-30 NOTE — ED Notes (Signed)
Pts hospice agency made aware that pt is on her way to to [facility] via [facility].

## 2020-03-30 NOTE — ED Provider Notes (Signed)
Patient had her nephrostomy tube replaced. She is still sleepy after her meds. Will give small fluid bolus based on mild bump in creatinine. Otherwise she will be ready to go back to facility after she wakes up   [name], MD 03/30/20 1327

## 2020-03-30 NOTE — ED Notes (Signed)
Pt went to radiology.

## 2020-03-30 NOTE — Procedures (Signed)
Pre Procedure Dx: Inadvertent removal of right sided PCN. Post Procedural Dx: Same  Successful fluoroscopic guided replacement of a right sided PCN with end coiled and locked within the renal pelvis. PCN connected to gravity bag.  EBL: None  Complications: None immediate.  Katherina Right, MD Pager #: [phone]

## 2020-03-30 NOTE — Consult Note (Signed)
Chief Complaint: Patient was seen in consultation today for  Chief Complaint  Patient presents with  . Fall    per es pt slipped out of chair at nursing home and pulled nephrostomy tube out     Referring Physician(s): Dr. Read Drivers  Supervising Physician: Simonne Come  Patient Status: [facility] - ED  History of Present Illness: [name] is a 86 y.o. female with a medical history significant for CAD, CABG, paroxysmal atrial fibrillation, heart failure, cognitive impairment, bilateral renal stones and right hydronephrosis s/p PCN placed 12/27/19. The patient currently resides at [facility] nursing home where sustained a fall last night which dislodged her right PCN. The patient was taken to the [facility] ED and Interventional Radiology has been asked to evaluate this patient for a right PCN replacement.  Past Medical History:  Diagnosis Date  . Allergy   . Asthma   . CAD (coronary artery disease)    s/p CABG x 5 in 1997 & CABG x 3 in 2010; Last cath in April of 2010 showed grafts to be patent. EF is 40 to 45%  . CHF (congestive heart failure) (HCC)    EF is 40 to 45%, 06/2015 EF 55-60%  . Depression    following bypass surgery  . Diverticulosis   . Endometriosis   . GERD (gastroesophageal reflux disease)   . Hepatitis A   . Hiatal hernia   . History of cerebrovascular disease   . History of vertebral fracture    at T8  . Hyperlipemia   . Hypertension   . LBP (low back pain)   . Mixed incontinence urge and stress 04/22/2013  . Nocturia 04/22/2013  . Pleural effusion    post surgical  . PVD (peripheral vascular disease) (HCC)    S/P right CEA  . Sleep apnea   . Stroke [facility] 2010    Past Surgical History:  Procedure Laterality Date  . APPENDECTOMY    . CARDIAC CATHETERIZATION  April 2010   Grafts were patent. EF is 40 to 45%  . CAROTID ENDARTERECTOMY     right  . CORONARY ARTERY BYPASS GRAFT  4034,[id number]   Original surgery in 1997 x 5; Redo CABG x 3 in 2010 including free  RIMA to distal LAD, SVG to OM and SVG to left posterolateral  . INTRAMEDULLARY (IM) NAIL INTERTROCHANTERIC Right 11/19/2018   Procedure: INTRAMEDULLARY (IM) NAIL INTERTROCHANTRIC;  Surgeon: Yolonda Kida, MD;  Location: [facility] OR;  Service: Orthopedics;  Laterality: Right;  . IR NEPHROSTOMY PLACEMENT RIGHT  12/27/2019  . KNEE SURGERY     left  . LEFT HEART CATHETERIZATION WITH CORONARY/GRAFT ANGIOGRAM N/A 03/12/2013   Procedure: LEFT HEART CATHETERIZATION WITH Isabel Caprice;  Surgeon: Peter M Swaziland, MD;  Location: [facility] CATH LAB;  Service: Cardiovascular;  Laterality: N/A;  . PARTIAL COLECTOMY     due to colon abcess    Allergies: Metoprolol, Phenergan [promethazine hcl], Prochlorperazine edisylate, Zetia [ezetimibe], Compazine [prochlorperazine], Sulfur, Niacin, and Sulfonamide derivatives  Medications: Prior to Admission medications   Medication Sig Start Date End Date Taking? Authorizing Provider  acetaminophen (TYLENOL) 325 MG tablet Take 2 tablets (650 mg total) by mouth every 6 (six) hours as needed for mild pain, fever or headache (or Fever >/= 101). 09/11/18  Yes Emokpae, Courage, MD  amiodarone (PACERONE) 200 MG tablet Take 1 tablet (200 mg total) by mouth daily. 01/13/20  Yes Danford, Earl Lites, MD  aspirin EC 81 MG tablet Take 1 tablet (81 mg total) by mouth  daily with breakfast. 09/11/18  Yes Emokpae, Courage, MD  Calcium Carbonate-Vitamin D (OYSTER SHELL CALCIUM 500 + D PO) Take 1 tablet by mouth in the morning and at bedtime.   Yes [provider]  carvedilol (COREG CR) 20 MG 24 hr capsule Take 20 mg by mouth daily. 03/10/20  Yes [provider]  cephALEXin (KEFLEX) 250 MG capsule Take 1 capsule (250 mg total) by mouth daily. 01/04/20 01/03/21 Yes Danford, Earl Lites, MD  docusate sodium (COLACE) 100 MG capsule Take 1 capsule (100 mg total) by mouth 2 (two) times daily. 11/21/18  Yes Swayze, Ava, DO  escitalopram (LEXAPRO) 10 MG tablet Take 15 mg by  mouth daily.   Yes [provider]  esomeprazole (NEXIUM) 20 MG capsule Take 20 mg by mouth daily at 12 noon.    Yes [provider]  gabapentin (NEURONTIN) 100 MG capsule Take 100 mg by mouth 2 (two) times daily.   Yes [provider]  guaiFENesin (MUCINEX) 600 MG 12 hr tablet Take 600 mg by mouth 2 (two) times daily.   Yes [provider]  Lidocaine (ASPERCREME LIDOCAINE) 4 % PTCH Apply 1 patch topically every 12 (twelve) hours. On in AM and off in PM   Yes [provider]  melatonin 5 MG TABS Take 5 mg by mouth at bedtime.   Yes [provider]  oxyCODONE (OXY IR/ROXICODONE) 5 MG immediate release tablet Take 2 tablets (10 mg total) by mouth every 4 (four) hours as needed for severe pain. 01/02/20  Yes Danford, Earl Lites, MD  potassium chloride (MICRO-K) 10 MEQ CR capsule Take 1 capsule (10 mEq total) by mouth daily. 09/11/18  Yes Emokpae, Courage, MD  amiodarone (PACERONE) 200 MG tablet Take 1 tablet (200 mg total) by mouth 2 (two) times daily. Patient not taking: Reported on 03/30/2020 01/02/20   Alberteen Sam, MD  nebivolol (BYSTOLIC) 5 MG tablet Take 1 tablet (5 mg total) by mouth daily. Patient not taking: Reported on 03/30/2020 01/03/20   Alberteen Sam, MD  rosuvastatin (CRESTOR) 20 MG tablet Take 1 tablet (20 mg total) by mouth daily. Pt needs to keep appt in Oct for further refills Patient not taking: Reported on 03/30/2020 02/09/19   Nahser, Deloris Ping, MD     Family History  Problem Relation Age of Onset  . Heart attack Mother   . Diabetes Mother   . Heart disease Mother   . Rheum arthritis Mother   . Heart attack Father   . Allergies Father   . Diabetes Sister   . Stroke Sister   . Heart disease Sister   . Dementia Brother   . Healthy Sister   . Other Brother        LUNG ISSUES  . Other Brother        LUNG ISSUES  . Other Brother        LUNG ISSUES  . Diabetes Maternal Grandmother   . Diabetes Paternal  Grandmother   . Colon cancer Neg Hx     Social History   Socioeconomic History  . Marital status: Widowed    Spouse name: Not on file  . Number of children: 0  . Years of education: Not on file  . Highest education level: Not on file  Occupational History  . Occupation: retired Magazine features editor: RETIRED  Tobacco Use  . Smoking status: Never Smoker  . Smokeless tobacco: Never Used  Vaping Use  . Vaping Use: Never  used  Substance and Sexual Activity  . Alcohol use: No    Alcohol/week: 0.0 standard drinks  . Drug use: No  . Sexual activity: Never  Other Topics Concern  . Not on file  Social History Narrative  . Not on file   Social Determinants of Health   Financial Resource Strain:   . Difficulty of Paying Living Expenses: Not on file  Food Insecurity:   . Worried About Programme researcher, broadcasting/film/video in the Last Year: Not on file  . Ran Out of Food in the Last Year: Not on file  Transportation Needs:   . Lack of Transportation (Medical): Not on file  . Lack of Transportation (Non-Medical): Not on file  Physical Activity:   . Days of Exercise per Week: Not on file  . Minutes of Exercise per Session: Not on file  Stress:   . Feeling of Stress : Not on file  Social Connections:   . Frequency of Communication with Friends and Family: Not on file  . Frequency of Social Gatherings with Friends and Family: Not on file  . Attends Religious Services: Not on file  . Active Member of Clubs or Organizations: Not on file  . Attends Banker Meetings: Not on file  . Marital Status: Not on file    Review of Systems: A 12 point ROS discussed and pertinent positives are indicated in the HPI above.  All other systems are negative.  Review of Systems  Unable to perform ROS: Dementia    Vital Signs: BP (!) 124/55   Pulse 81   Temp 98.5 F (36.9 C)   Resp 20   SpO2 93%   Physical Exam Constitutional:      General: She is not in acute distress. HENT:      Mouth/Throat:     Mouth: Mucous membranes are dry.     Pharynx: Oropharynx is clear.  Cardiovascular:     Rate and Rhythm: Normal rate and regular rhythm.     Pulses: Normal pulses.     Heart sounds: Normal heart sounds.  Pulmonary:     Effort: Pulmonary effort is normal.     Breath sounds: Normal breath sounds.  Abdominal:     General: Bowel sounds are normal.     Palpations: Abdomen is soft.     Comments: Right PCN has been removed. Small puncture site from prior tube is clearly visible, scant amount of drainage from the site. No erythema.   Genitourinary:    Comments: Incontinent  Musculoskeletal:     Comments: Lidocaine patch to mid lower back  Skin:    General: Skin is warm and dry.     Comments: Scattered scabs and bruises  Neurological:     Mental Status: She is alert. She is disoriented.     Cranial Nerves: Dysarthria present.     Comments: Garbled speech.      Imaging: CT Head Wo Contrast  Result Date: 03/29/2020 CLINICAL DATA:  Recent fall with headaches and neck pain, initial encounter EXAM: CT HEAD WITHOUT CONTRAST CT CERVICAL SPINE WITHOUT CONTRAST TECHNIQUE: Multidetector CT imaging of the head and cervical spine was performed following the standard protocol without intravenous contrast. Multiplanar CT image reconstructions of the cervical spine were also generated. COMPARISON:  04/09/2019, 09/03/2017 FINDINGS: CT HEAD FINDINGS Brain: No evidence of acute infarction, hemorrhage, hydrocephalus, extra-axial collection or mass lesion/mass effect. Mild atrophic changes and chronic white matter ischemic changes are again noted. Vascular: No hyperdense vessel or unexpected  calcification. Skull: Normal. Negative for fracture or focal lesion. Sinuses/Orbits: No acute finding. Other: None. CT CERVICAL SPINE FINDINGS Alignment: Within normal limits. Skull base and vertebrae: 7 cervical segments are well visualized. Vertebral body height is well maintained. Osteophytic changes are  noted at C5-6 and C6-7. Mild disc space narrowing is noted as well. Multilevel facet hypertrophic changes are noted. No findings to suggest acute fracture or acute facet abnormality are noted. Soft tissues and spinal canal: Surrounding soft tissue structures show new diffuse carotid calcifications. A 1.5 cm hypodense lesion is noted within the left thyroid gland. This is stable from the prior exam. Upper chest: Lung apices are within normal limits. Other: None IMPRESSION: CT of the head: Chronic atrophic and ischemic changes without acute abnormality. CT of cervical spine: Multilevel degenerative changes without acute abnormality. Stable 1.5 cm hypodense lesion within the left lobe of the thyroid dating back to 2019. No follow-up recommended unless clinically warranted (ref: J Am Coll Radiol. 2015 Feb;12(2): 143-50). Electronically Signed   By: Alcide CleverMark  Lukens M.D.   On: 03/29/2020 23:07   CT Cervical Spine Wo Contrast  Result Date: 03/29/2020 CLINICAL DATA:  Recent fall with headaches and neck pain, initial encounter EXAM: CT HEAD WITHOUT CONTRAST CT CERVICAL SPINE WITHOUT CONTRAST TECHNIQUE: Multidetector CT imaging of the head and cervical spine was performed following the standard protocol without intravenous contrast. Multiplanar CT image reconstructions of the cervical spine were also generated. COMPARISON:  04/09/2019, 09/03/2017 FINDINGS: CT HEAD FINDINGS Brain: No evidence of acute infarction, hemorrhage, hydrocephalus, extra-axial collection or mass lesion/mass effect. Mild atrophic changes and chronic white matter ischemic changes are again noted. Vascular: No hyperdense vessel or unexpected calcification. Skull: Normal. Negative for fracture or focal lesion. Sinuses/Orbits: No acute finding. Other: None. CT CERVICAL SPINE FINDINGS Alignment: Within normal limits. Skull base and vertebrae: 7 cervical segments are well visualized. Vertebral body height is well maintained. Osteophytic changes are noted at  C5-6 and C6-7. Mild disc space narrowing is noted as well. Multilevel facet hypertrophic changes are noted. No findings to suggest acute fracture or acute facet abnormality are noted. Soft tissues and spinal canal: Surrounding soft tissue structures show new diffuse carotid calcifications. A 1.5 cm hypodense lesion is noted within the left thyroid gland. This is stable from the prior exam. Upper chest: Lung apices are within normal limits. Other: None IMPRESSION: CT of the head: Chronic atrophic and ischemic changes without acute abnormality. CT of cervical spine: Multilevel degenerative changes without acute abnormality. Stable 1.5 cm hypodense lesion within the left lobe of the thyroid dating back to 2019. No follow-up recommended unless clinically warranted (ref: J Am Coll Radiol. 2015 Feb;12(2): 143-50). Electronically Signed   By: Alcide CleverMark  Lukens M.D.   On: 03/29/2020 23:07    Labs:  CBC: Recent Labs    12/29/19 0207 12/30/19 0223 12/31/19 0101 01/01/20 0323  WBC 19.9* 13.8* 10.8* 9.0  HGB 9.3* 9.1* 9.8* 9.3*  HCT 30.1* 28.7* 30.3* 29.8*  PLT 204 213 220 239    COAGS: Recent Labs    12/28/19 0421  APTT 42*    BMP: Recent Labs    12/29/19 0207 12/30/19 0223 12/31/19 0101 01/01/20 0323  NA 140 139 141 142  K 3.7 3.5 3.1* 4.2  CL 105 107 109 112*  CO2 22 21* 22 19*  GLUCOSE 74 104* 115* 111*  BUN 23 23 17 13   CALCIUM 8.5* 8.4* 8.2* 8.4*  CREATININE 1.12* 1.05* 0.97 0.85  GFRNONAA 44* 47* 52* >60  GFRAA 51* 55* >60 >60    LIVER FUNCTION TESTS: Recent Labs    12/28/19 0421 12/30/19 0223 12/31/19 0101 01/01/20 0323  BILITOT 0.5 1.1 0.5 0.7  AST ALT ALKPHOS 48 68 75 67  PROT 3.3* 5.1* 4.9* 4.7*  ALBUMIN 1.4* 2.1* 2.2* 2.1*    TUMOR MARKERS: No results for input(s): AFPTM, CEA, CA199, CHROMGRNA in the last 8760 hours.  Assessment and Plan:  Bilateral renal stones/right PCN placed 12/27/19; PCN inadvertently removed: [name],  86 year old female, is scheduled for a right PCN replacement 03/30/20 at the [facility] Interventional Radiology department. The patient was in her usual state of health at her nursing home but unfortunately suffered a fall and the PCN was dislodged. The procedure to replaced the PCN was explained to and consent obtained from the patient's niece [name].   Risks and benefits of right PCN placement was discussed with the patient's niece including, but not limited to, infection, bleeding, significant bleeding causing loss or decrease in renal function or damage to adjacent structures.   All of the patient's niece's questions were answered and she is agreeable to proceed.  The patient has been NPO. Vitals have been reviewed. Labs have been ordered. IV antibiotics have been ordered for surgical prophylaxis.   Consent signed and in IR  Thank you for this interesting consult.  I greatly enjoyed meeting [name] and look forward to participating in their care.  A copy of this report was sent to the requesting provider on this date.  Electronically Signed: Alwyn Ren, AGACNP-BC [phone] 03/30/2020, 8:27 AM   I spent a total of 20 Minutes    in face to face in clinical consultation, greater than 50% of which was counseling/coordinating care for right percutaneous nephrostomy tube placement.

## 2020-03-30 NOTE — ED Provider Notes (Signed)
Radiology indicates nephrostomy tube will be replaced this afternoon. Will order home meds.   [name], MD 03/30/20 [phone]

## 2020-03-30 NOTE — Progress Notes (Signed)
Civil engineer, contracting [facility]) Hospital Liaison Note      This patient is enrolled in our hospice services.  [facility] will continue to follow for any discharge planning needs and to coordinate continuation of hospice care.    At time of discharge, please use [facility] for transport.  [facility] contracts with them for our hospice patients.  If you have questions or need assistance, please call [phone] or contact the hospital Liaison listed on AMION.     Thank you for the opportunity to participate in this patients care.     Gillian Scarce, BSN, RN [facility] Liaison   [phone] (24h on call)

## 2020-03-30 NOTE — ED Notes (Signed)
[name] niece [phone] cell   [phone] Home phone

## 2020-03-30 NOTE — ED Provider Notes (Signed)
Pt is more awake and has availability of oxygen at her facility.  She is stable for d/c.  Return if worse.   Jacalyn Lefevre, MD 03/30/20 [phone]

## 2020-03-30 NOTE — ED Notes (Signed)
Radiology stated they would come to get patient to replace tube mid afternoon.

## 2020-03-31 ENCOUNTER — Other Ambulatory Visit (HOSPITAL_COMMUNITY): Payer: Self-pay | Admitting: Interventional Radiology

## 2020-03-31 DIAGNOSIS — N135 Crossing vessel and stricture of ureter without hydronephrosis: Secondary | ICD-10-CM

## 2020-04-29 DIAGNOSIS — I5032 Chronic diastolic (congestive) heart failure: Secondary | ICD-10-CM | POA: Diagnosis not present

## 2020-04-29 DIAGNOSIS — E559 Vitamin D deficiency, unspecified: Secondary | ICD-10-CM | POA: Diagnosis not present

## 2020-04-29 DIAGNOSIS — I10 Essential (primary) hypertension: Secondary | ICD-10-CM | POA: Diagnosis not present

## 2020-04-29 DIAGNOSIS — E119 Type 2 diabetes mellitus without complications: Secondary | ICD-10-CM | POA: Diagnosis not present

## 2020-04-29 DIAGNOSIS — E440 Moderate protein-calorie malnutrition: Secondary | ICD-10-CM | POA: Diagnosis not present

## 2020-05-03 DIAGNOSIS — Z20822 Contact with and (suspected) exposure to covid-19: Secondary | ICD-10-CM | POA: Diagnosis not present

## 2020-05-04 DIAGNOSIS — N132 Hydronephrosis with renal and ureteral calculous obstruction: Secondary | ICD-10-CM | POA: Diagnosis not present

## 2020-05-04 DIAGNOSIS — S72009A Fracture of unspecified part of neck of unspecified femur, initial encounter for closed fracture: Secondary | ICD-10-CM | POA: Diagnosis not present

## 2020-05-04 DIAGNOSIS — M25559 Pain in unspecified hip: Secondary | ICD-10-CM | POA: Diagnosis not present

## 2020-05-04 DIAGNOSIS — I639 Cerebral infarction, unspecified: Secondary | ICD-10-CM | POA: Diagnosis not present

## 2020-05-04 DIAGNOSIS — A419 Sepsis, unspecified organism: Secondary | ICD-10-CM | POA: Diagnosis not present

## 2020-05-04 DIAGNOSIS — I2510 Atherosclerotic heart disease of native coronary artery without angina pectoris: Secondary | ICD-10-CM | POA: Diagnosis not present

## 2020-05-04 DIAGNOSIS — I739 Peripheral vascular disease, unspecified: Secondary | ICD-10-CM | POA: Diagnosis not present

## 2020-05-04 DIAGNOSIS — I509 Heart failure, unspecified: Secondary | ICD-10-CM | POA: Diagnosis not present

## 2020-05-04 DIAGNOSIS — I10 Essential (primary) hypertension: Secondary | ICD-10-CM | POA: Diagnosis not present

## 2020-05-05 DIAGNOSIS — Z20822 Contact with and (suspected) exposure to covid-19: Secondary | ICD-10-CM | POA: Diagnosis not present

## 2020-05-06 DIAGNOSIS — F331 Major depressive disorder, recurrent, moderate: Secondary | ICD-10-CM | POA: Diagnosis not present

## 2020-05-11 ENCOUNTER — Other Ambulatory Visit: Payer: Self-pay | Admitting: Student

## 2020-05-12 ENCOUNTER — Inpatient Hospital Stay (HOSPITAL_COMMUNITY): Admission: RE | Admit: 2020-05-12 | Payer: Medicare PPO | Source: Ambulatory Visit

## 2020-05-12 ENCOUNTER — Other Ambulatory Visit (HOSPITAL_COMMUNITY): Payer: Medicare PPO

## 2020-05-28 DIAGNOSIS — F331 Major depressive disorder, recurrent, moderate: Secondary | ICD-10-CM | POA: Diagnosis not present

## 2020-06-17 DIAGNOSIS — F331 Major depressive disorder, recurrent, moderate: Secondary | ICD-10-CM | POA: Diagnosis not present

## 2020-06-30 DIAGNOSIS — R059 Cough, unspecified: Secondary | ICD-10-CM | POA: Diagnosis not present

## 2020-06-30 DIAGNOSIS — I5032 Chronic diastolic (congestive) heart failure: Secondary | ICD-10-CM | POA: Diagnosis not present

## 2020-07-01 DIAGNOSIS — R059 Cough, unspecified: Secondary | ICD-10-CM | POA: Diagnosis not present

## 2020-07-01 DIAGNOSIS — N390 Urinary tract infection, site not specified: Secondary | ICD-10-CM | POA: Diagnosis not present

## 2020-07-26 DEATH — deceased
